# Patient Record
Sex: Male | Born: 1955
Health system: Southern US, Community
[De-identification: ages and names within clinical notes are randomized; demographics above are authoritative.]

## PROBLEM LIST (undated history)

## (undated) DIAGNOSIS — E538 Deficiency of other specified B group vitamins: Secondary | ICD-10-CM

## (undated) DIAGNOSIS — L03114 Cellulitis of left upper limb: Secondary | ICD-10-CM

## (undated) DIAGNOSIS — Z720 Tobacco use: Secondary | ICD-10-CM

## (undated) DIAGNOSIS — F32A Depression, unspecified: Secondary | ICD-10-CM

## (undated) DIAGNOSIS — M5090 Cervical disc disorder, unspecified, unspecified cervical region: Secondary | ICD-10-CM

## (undated) DIAGNOSIS — K219 Gastro-esophageal reflux disease without esophagitis: Secondary | ICD-10-CM

## (undated) DIAGNOSIS — T8859XA Other complications of anesthesia, initial encounter: Secondary | ICD-10-CM

## (undated) DIAGNOSIS — F419 Anxiety disorder, unspecified: Secondary | ICD-10-CM

## (undated) DIAGNOSIS — T4145XA Adverse effect of unspecified anesthetic, initial encounter: Secondary | ICD-10-CM

## (undated) DIAGNOSIS — IMO0001 Reserved for inherently not codable concepts without codable children: Secondary | ICD-10-CM

## (undated) DIAGNOSIS — F329 Major depressive disorder, single episode, unspecified: Secondary | ICD-10-CM

## (undated) DIAGNOSIS — R079 Chest pain, unspecified: Secondary | ICD-10-CM

## (undated) DIAGNOSIS — M545 Low back pain, unspecified: Secondary | ICD-10-CM

## (undated) DIAGNOSIS — D649 Anemia, unspecified: Secondary | ICD-10-CM

## (undated) DIAGNOSIS — M2550 Pain in unspecified joint: Secondary | ICD-10-CM

## (undated) DIAGNOSIS — C801 Malignant (primary) neoplasm, unspecified: Secondary | ICD-10-CM

## (undated) DIAGNOSIS — M1990 Unspecified osteoarthritis, unspecified site: Secondary | ICD-10-CM

## (undated) DIAGNOSIS — M542 Cervicalgia: Secondary | ICD-10-CM

## (undated) DIAGNOSIS — K5730 Diverticulosis of large intestine without perforation or abscess without bleeding: Secondary | ICD-10-CM

## (undated) DIAGNOSIS — R29898 Other symptoms and signs involving the musculoskeletal system: Secondary | ICD-10-CM

## (undated) DIAGNOSIS — I10 Essential (primary) hypertension: Secondary | ICD-10-CM

## (undated) DIAGNOSIS — R091 Pleurisy: Secondary | ICD-10-CM

## (undated) DIAGNOSIS — M79603 Pain in arm, unspecified: Secondary | ICD-10-CM

## (undated) DIAGNOSIS — M79606 Pain in leg, unspecified: Secondary | ICD-10-CM

## (undated) DIAGNOSIS — M549 Dorsalgia, unspecified: Secondary | ICD-10-CM

## (undated) DIAGNOSIS — G8929 Other chronic pain: Secondary | ICD-10-CM

## (undated) DIAGNOSIS — E785 Hyperlipidemia, unspecified: Secondary | ICD-10-CM

## (undated) HISTORY — PX: ROTATOR CUFF REPAIR: SHX139

## (undated) HISTORY — DX: Deficiency of other specified B group vitamins: E53.8

## (undated) HISTORY — PX: POLYPECTOMY: SHX149

## (undated) HISTORY — DX: Malignant (primary) neoplasm, unspecified: C80.1

## (undated) HISTORY — DX: Diverticulosis of large intestine without perforation or abscess without bleeding: K57.30

## (undated) HISTORY — PX: COLONOSCOPY: SHX174

## (undated) HISTORY — DX: Anemia, unspecified: D64.9

## (undated) HISTORY — DX: Pain in leg, unspecified: M79.606

## (undated) HISTORY — PX: ELBOW ARTHROSCOPY: SUR87

## (undated) HISTORY — DX: Gastro-esophageal reflux disease without esophagitis: K21.9

## (undated) HISTORY — DX: Cervicalgia: M54.2

## (undated) HISTORY — DX: Hyperlipidemia, unspecified: E78.5

## (undated) HISTORY — DX: Essential (primary) hypertension: I10

## (undated) HISTORY — PX: CERVICAL DISC SURGERY: SHX588

## (undated) HISTORY — DX: Anxiety disorder, unspecified: F41.9

## (undated) HISTORY — PX: NECK SURGERY: SHX720

## (undated) HISTORY — DX: Unspecified osteoarthritis, unspecified site: M19.90

## (undated) HISTORY — DX: Other symptoms and signs involving the musculoskeletal system: R29.898

## (undated) HISTORY — PX: CARPAL TUNNEL RELEASE: SHX101

## (undated) HISTORY — DX: Pain in unspecified joint: M25.50

## (undated) HISTORY — DX: Pain in arm, unspecified: M79.603

## (undated) HISTORY — DX: Dorsalgia, unspecified: M54.9

---

## 2002-06-28 ENCOUNTER — Emergency Department (HOSPITAL_COMMUNITY): Admission: EM | Admit: 2002-06-28 | Discharge: 2002-06-28 | Payer: Self-pay | Admitting: *Deleted

## 2002-09-05 ENCOUNTER — Encounter: Admission: RE | Admit: 2002-09-05 | Discharge: 2002-09-05 | Payer: Self-pay | Admitting: Neurosurgery

## 2002-09-05 ENCOUNTER — Encounter: Payer: Self-pay | Admitting: Neurosurgery

## 2002-10-03 ENCOUNTER — Encounter: Payer: Self-pay | Admitting: Neurosurgery

## 2002-10-03 ENCOUNTER — Encounter: Admission: RE | Admit: 2002-10-03 | Discharge: 2002-10-03 | Payer: Self-pay | Admitting: Neurosurgery

## 2003-08-06 ENCOUNTER — Ambulatory Visit (HOSPITAL_COMMUNITY): Admission: RE | Admit: 2003-08-06 | Discharge: 2003-08-07 | Payer: Self-pay | Admitting: Neurosurgery

## 2004-05-26 ENCOUNTER — Encounter: Admission: RE | Admit: 2004-05-26 | Discharge: 2004-05-26 | Payer: Self-pay | Admitting: Otolaryngology

## 2004-06-17 ENCOUNTER — Encounter: Admission: RE | Admit: 2004-06-17 | Discharge: 2004-06-17 | Payer: Self-pay

## 2005-12-25 ENCOUNTER — Inpatient Hospital Stay (HOSPITAL_COMMUNITY): Admission: EM | Admit: 2005-12-25 | Discharge: 2005-12-27 | Payer: Self-pay | Admitting: Emergency Medicine

## 2006-01-02 ENCOUNTER — Ambulatory Visit: Payer: Self-pay | Admitting: Internal Medicine

## 2006-11-20 ENCOUNTER — Ambulatory Visit: Payer: Self-pay | Admitting: Internal Medicine

## 2006-11-28 ENCOUNTER — Ambulatory Visit: Payer: Self-pay | Admitting: Internal Medicine

## 2006-11-28 LAB — HM COLONOSCOPY

## 2007-07-11 ENCOUNTER — Ambulatory Visit (HOSPITAL_COMMUNITY): Admission: RE | Admit: 2007-07-11 | Discharge: 2007-07-11 | Payer: Self-pay | Admitting: Neurosurgery

## 2007-10-18 ENCOUNTER — Inpatient Hospital Stay (HOSPITAL_COMMUNITY): Admission: RE | Admit: 2007-10-18 | Discharge: 2007-10-20 | Payer: Self-pay | Admitting: Neurosurgery

## 2008-04-20 ENCOUNTER — Encounter: Payer: Self-pay | Admitting: Internal Medicine

## 2008-04-21 ENCOUNTER — Encounter: Payer: Self-pay | Admitting: Internal Medicine

## 2008-06-02 ENCOUNTER — Encounter: Payer: Self-pay | Admitting: Internal Medicine

## 2008-06-29 DIAGNOSIS — IMO0002 Reserved for concepts with insufficient information to code with codable children: Secondary | ICD-10-CM | POA: Insufficient documentation

## 2008-06-29 DIAGNOSIS — K5730 Diverticulosis of large intestine without perforation or abscess without bleeding: Secondary | ICD-10-CM | POA: Insufficient documentation

## 2008-06-29 DIAGNOSIS — I10 Essential (primary) hypertension: Secondary | ICD-10-CM | POA: Insufficient documentation

## 2008-06-29 DIAGNOSIS — K219 Gastro-esophageal reflux disease without esophagitis: Secondary | ICD-10-CM | POA: Insufficient documentation

## 2008-06-29 DIAGNOSIS — M129 Arthropathy, unspecified: Secondary | ICD-10-CM | POA: Insufficient documentation

## 2008-06-29 DIAGNOSIS — F419 Anxiety disorder, unspecified: Secondary | ICD-10-CM

## 2008-06-29 DIAGNOSIS — M5020 Other cervical disc displacement, unspecified cervical region: Secondary | ICD-10-CM | POA: Insufficient documentation

## 2008-06-30 ENCOUNTER — Ambulatory Visit: Payer: Self-pay | Admitting: Internal Medicine

## 2008-06-30 DIAGNOSIS — R1032 Left lower quadrant pain: Secondary | ICD-10-CM | POA: Insufficient documentation

## 2008-06-30 DIAGNOSIS — D649 Anemia, unspecified: Secondary | ICD-10-CM | POA: Insufficient documentation

## 2008-06-30 LAB — CONVERTED CEMR LAB
Ferritin: 196 ng/mL (ref 22.0–322.0)
Folate: 10.2 ng/mL
Iron: 106 ug/dL (ref 42–165)
Saturation Ratios: 26.7 % (ref 20.0–50.0)
Transferrin: 283.8 mg/dL (ref >=212.0)
Vitamin B-12: 170 pg/mL — ABNORMAL LOW (ref 211–911)

## 2008-09-08 ENCOUNTER — Ambulatory Visit: Payer: Self-pay | Admitting: Internal Medicine

## 2008-09-08 DIAGNOSIS — E538 Deficiency of other specified B group vitamins: Secondary | ICD-10-CM

## 2008-09-08 DIAGNOSIS — E785 Hyperlipidemia, unspecified: Secondary | ICD-10-CM | POA: Insufficient documentation

## 2008-09-08 DIAGNOSIS — Z860100 Personal history of colon polyps, unspecified: Secondary | ICD-10-CM | POA: Insufficient documentation

## 2008-10-07 ENCOUNTER — Ambulatory Visit: Payer: Self-pay | Admitting: Internal Medicine

## 2008-11-05 ENCOUNTER — Ambulatory Visit: Payer: Self-pay | Admitting: Internal Medicine

## 2008-12-03 ENCOUNTER — Ambulatory Visit: Payer: Self-pay | Admitting: Internal Medicine

## 2009-01-07 ENCOUNTER — Ambulatory Visit: Payer: Self-pay | Admitting: Internal Medicine

## 2009-01-27 ENCOUNTER — Ambulatory Visit: Payer: Self-pay | Admitting: Internal Medicine

## 2009-02-04 ENCOUNTER — Ambulatory Visit: Payer: Self-pay | Admitting: Internal Medicine

## 2009-03-08 ENCOUNTER — Ambulatory Visit: Payer: Self-pay | Admitting: Internal Medicine

## 2009-04-05 ENCOUNTER — Ambulatory Visit: Payer: Self-pay | Admitting: Internal Medicine

## 2009-04-28 ENCOUNTER — Ambulatory Visit: Payer: Self-pay | Admitting: Internal Medicine

## 2009-05-03 ENCOUNTER — Ambulatory Visit: Payer: Self-pay | Admitting: Internal Medicine

## 2009-05-27 ENCOUNTER — Telehealth: Payer: Self-pay | Admitting: Internal Medicine

## 2009-05-27 ENCOUNTER — Ambulatory Visit: Payer: Self-pay | Admitting: Internal Medicine

## 2009-05-27 DIAGNOSIS — M109 Gout, unspecified: Secondary | ICD-10-CM | POA: Insufficient documentation

## 2009-05-27 LAB — CONVERTED CEMR LAB: Uric Acid, Serum: 6.6 mg/dL (ref 4.0–7.8)

## 2009-06-28 ENCOUNTER — Ambulatory Visit: Payer: Self-pay | Admitting: Internal Medicine

## 2009-07-13 ENCOUNTER — Telehealth: Payer: Self-pay | Admitting: Internal Medicine

## 2009-07-29 ENCOUNTER — Ambulatory Visit: Payer: Self-pay | Admitting: Internal Medicine

## 2009-08-30 ENCOUNTER — Ambulatory Visit: Payer: Self-pay | Admitting: Internal Medicine

## 2009-09-27 ENCOUNTER — Ambulatory Visit: Payer: Self-pay | Admitting: Internal Medicine

## 2009-11-01 ENCOUNTER — Ambulatory Visit: Payer: Self-pay | Admitting: Internal Medicine

## 2009-11-01 LAB — CONVERTED CEMR LAB
ALT: 39 units/L (ref 0–53)
AST: 34 units/L (ref 0–37)
Albumin: 4.3 g/dL (ref 3.5–5.2)
Alkaline Phosphatase: 74 units/L (ref 39–117)
BUN: 10 mg/dL (ref 6–23)
Basophils Absolute: 0 10*3/uL (ref 0.0–0.1)
Basophils Relative: 0.5 % (ref 0.0–3.0)
Bilirubin, Direct: 0.1 mg/dL (ref 0.0–0.3)
CO2: 28 meq/L (ref 19–32)
Calcium: 9.3 mg/dL (ref 8.4–10.5)
Chloride: 103 meq/L (ref 96–112)
Cholesterol: 192 mg/dL (ref 0–200)
Creatinine, Ser: 1.2 mg/dL (ref 0.4–1.5)
Direct LDL: 106.7 mg/dL
Eosinophils Absolute: 0.1 10*3/uL (ref 0.0–0.7)
Eosinophils Relative: 1.9 % (ref 0.0–5.0)
GFR calc non Af Amer: 67.5 mL/min (ref >60)
Glucose, Bld: 99 mg/dL (ref 70–99)
HCT: 40.8 % (ref 39.0–52.0)
HDL: 49.6 mg/dL (ref >39.00)
Hemoglobin: 14 g/dL (ref 13.0–17.0)
Lymphocytes Relative: 24.4 % (ref 12.0–46.0)
Lymphs Abs: 1.7 10*3/uL (ref 0.7–4.0)
MCHC: 34.3 g/dL (ref 30.0–36.0)
MCV: 93.9 fL (ref 78.0–100.0)
Monocytes Absolute: 0.5 10*3/uL (ref 0.1–1.0)
Monocytes Relative: 6.8 % (ref 3.0–12.0)
Neutro Abs: 4.7 10*3/uL (ref 1.4–7.7)
Neutrophils Relative %: 66.4 % (ref 43.0–77.0)
PSA: 0.47 ng/mL (ref 0.10–4.00)
Platelets: 197 10*3/uL (ref 150.0–400.0)
Potassium: 3.9 meq/L (ref 3.5–5.1)
RBC: 4.35 M/uL (ref 4.22–5.81)
RDW: 13.6 % (ref 11.5–14.6)
Sodium: 141 meq/L (ref 135–145)
TSH: 0.67 microintl units/mL (ref 0.35–5.50)
Total Bilirubin: 0.7 mg/dL (ref 0.3–1.2)
Total CHOL/HDL Ratio: 4
Total Protein: 7.1 g/dL (ref 6.0–8.3)
Triglycerides: 309 mg/dL — ABNORMAL HIGH (ref 0.0–149.0)
VLDL: 61.8 mg/dL — ABNORMAL HIGH (ref 0.0–40.0)
WBC: 7.1 10*3/uL (ref 4.5–10.5)

## 2009-11-15 ENCOUNTER — Ambulatory Visit: Payer: Self-pay | Admitting: Internal Medicine

## 2009-12-01 ENCOUNTER — Ambulatory Visit: Payer: Self-pay | Admitting: Internal Medicine

## 2010-01-04 ENCOUNTER — Ambulatory Visit: Payer: Self-pay | Admitting: Internal Medicine

## 2010-02-04 ENCOUNTER — Telehealth: Payer: Self-pay | Admitting: Internal Medicine

## 2010-02-04 ENCOUNTER — Ambulatory Visit: Payer: Self-pay | Admitting: Internal Medicine

## 2010-03-09 ENCOUNTER — Ambulatory Visit: Payer: Self-pay | Admitting: Internal Medicine

## 2010-03-17 ENCOUNTER — Ambulatory Visit: Payer: Self-pay | Admitting: Internal Medicine

## 2010-04-11 ENCOUNTER — Ambulatory Visit: Payer: Self-pay | Admitting: Internal Medicine

## 2010-05-09 ENCOUNTER — Ambulatory Visit: Payer: Self-pay | Admitting: Internal Medicine

## 2010-06-10 ENCOUNTER — Ambulatory Visit: Payer: Self-pay | Admitting: Internal Medicine

## 2010-07-11 ENCOUNTER — Ambulatory Visit: Payer: Self-pay | Admitting: Internal Medicine

## 2010-08-08 ENCOUNTER — Ambulatory Visit
Admission: RE | Admit: 2010-08-08 | Discharge: 2010-08-08 | Payer: Self-pay | Source: Home / Self Care | Attending: Internal Medicine | Admitting: Internal Medicine

## 2010-08-08 ENCOUNTER — Encounter: Payer: Self-pay | Admitting: Internal Medicine

## 2010-08-25 NOTE — Assessment & Plan Note (Signed)
Summary: b12 injection  Nurse Visit   Allergies: 1)  ! Percodan  Medication Administration  Injection # 1:    Medication: Vit B12 1000 mcg    Diagnosis: VITAMIN B12 DEFICIENCY (ICD-266.2)    Route: IM    Site: L deltoid    Exp Date: 04/2012    Lot #: [id number]    Mfr: [employer] Regent    Patient tolerated injection without complications    Given by: Duard Brady LPN (August 08, 2010 8:44 AM)  Orders Added: 1)  Vit B12 1000 mcg [J3420] 2)  Admin of Therapeutic Inj  intramuscular or subcutaneous [16109]

## 2010-08-25 NOTE — Assessment & Plan Note (Signed)
Summary: B12 INJ/CB  Nurse Visit   Allergies: 1)  ! Percodan  Medication Administration  Injection # 1:    Medication: Vit B12 1000 mcg    Diagnosis: VITAMIN B12 DEFICIENCY (ICD-266.2)    Route: IM    Site: L deltoid    Exp Date: 03/2011    Lot #: [id number]    Mfr: American Regent    Patient tolerated injection without complications    Given by: Duard Brady LPN (Dec 01, 2009 8:47 AM)  Orders Added: 1)  Vit B12 1000 mcg [J3420] 2)  Admin of Therapeutic Inj  intramuscular or subcutaneous [[id number]]

## 2010-08-25 NOTE — Assessment & Plan Note (Signed)
Summary: B12 INJ  Nurse Visit   Vitals Entered By: Duard Brady LPN (January 04, 2010 9:03 AM)  Allergies: 1)  ! Percodan  Medication Administration  Injection # 1:    Medication: Vit B12 1000 mcg    Diagnosis: VITAMIN B12 DEFICIENCY (ICD-266.2)    Route: IM    Site: R deltoid    Exp Date: 10/2011    Lot #: [id number]    Mfr: app pharm.    Patient tolerated injection without complications    Given by: Duard Brady LPN (January 04, 2010 9:04 AM)  Orders Added: 1)  Vit B12 1000 mcg [J3420] 2)  Admin of Therapeutic Inj  intramuscular or subcutaneous [[id number]]

## 2010-08-25 NOTE — Assessment & Plan Note (Signed)
Summary: B12/cb  Nurse Visit   Vital Signs:  Patient profile:   55 year old male Temp:     98.4 degrees F oral  Vitals Entered By: Duard Brady LPN (March 09, 2010 8:45 AM)   Allergies: 1)  ! Percodan  Medication Administration  Injection # 1:    Medication: Vit B12 1000 mcg    Diagnosis: VITAMIN B12 DEFICIENCY (ICD-266.2)    Route: IM    Site: L deltoid    Exp Date: 08/2011    Lot #: [id number]    Mfr: American Regent    Patient tolerated injection without complications    Given by: Duard Brady LPN (March 09, 2010 8:46 AM)  Orders Added: 1)  Vit B12 1000 mcg [J3420]

## 2010-08-25 NOTE — Assessment & Plan Note (Signed)
Summary: B12 INJ/CB  Nurse Visit   Allergies: 1)  ! Percodan  Medication Administration  Injection # 1:    Medication: Vit B12 1000 mcg    Diagnosis: VITAMIN B12 DEFICIENCY (ICD-266.2)    Route: IM    Site: L deltoid    Exp Date: 03/2011    Lot #: [id number]    Mfr: American Regent    Patient tolerated injection without complications    Given by: Duard Brady LPN (November 01, 2009 8:58 AM)  Orders Added: 1)  Vit B12 1000 mcg [J3420] 2)  Admin of Therapeutic Inj  intramuscular or subcutaneous [[id number]]  per Dr. Amador Cunas - ok to do routine cpx labs since pt is fasting - appt 4/25   KIK  Appended Document: Orders Update    Clinical Lists Changes  Orders: Added new Service order of Venipuncture (69629) - Signed Added new Test order of TLB-Lipid Panel (80061-LIPID) - Signed Added new Test order of TLB-BMP (Basic Metabolic Panel-BMET) (80048-METABOL) - Signed Added new Test order of TLB-CBC Platelet - w/Differential (85025-CBCD) - Signed Added new Test order of TLB-Hepatic/Liver Function Pnl (80076-HEPATIC) - Signed Added new Test order of TLB-TSH (Thyroid Stimulating Hormone) (84443-TSH) - Signed Added new Test order of TLB-PSA (Prostate Specific Antigen) (84153-PSA) - Signed Added new Service order of UA Dipstick w/o Micro (automated)  (81003) - Signed Observations: Added new observation of COMMENTS: Rita Ohara  November 01, 2009 10:31 AM  (11/01/2009 9:11) Added new observation of PH URINE: 5.0  (11/01/2009 9:11) Added new observation of SPEC GR URIN: <1.005  (11/01/2009 9:11) Added new observation of APPEARANCE U: Clear  (11/01/2009 9:11) Added new observation of UA COLOR: yellow  (11/01/2009 9:11) Added new observation of WBC DIPSTK U: negative  (11/01/2009 9:11) Added new observation of NITRITE URN: negative  (11/01/2009 9:11) Added new observation of UROBILINOGEN: 0.2  (11/01/2009 9:11) Added new observation of PROTEIN, URN: negative  (11/01/2009 9:11) Added new  observation of BLOOD UR DIP: negative  (11/01/2009 9:11) Added new observation of KETONES URN: negative  (11/01/2009 9:11) Added new observation of BILIRUBIN UR: negative  (11/01/2009 9:11) Added new observation of GLUCOSE, URN: negative  (11/01/2009 9:11)      Laboratory Results   Urine Tests    Routine Urinalysis   Color: yellow Appearance: Clear Glucose: negative   (Normal Range: Negative) Bilirubin: negative   (Normal Range: Negative) Ketone: negative   (Normal Range: Negative) Spec. Gravity: <1.005   (Normal Range: 1.003-1.035) Blood: negative   (Normal Range: Negative) pH: 5.0   (Normal Range: 5.0-8.0) Protein: negative   (Normal Range: Negative) Urobilinogen: 0.2   (Normal Range: 0-1) Nitrite: negative   (Normal Range: Negative) Leukocyte Esterace: negative   (Normal Range: Negative)    Comments: Rita Ohara  November 01, 2009 10:31 AM

## 2010-08-25 NOTE — Progress Notes (Signed)
Summary: lisnopril 90 dayrx  Phone Note Call from Patient   Caller: Patient Call For: Gordy Savers  MD Reason for Call: Talk to Nurse Summary of Call: need rx lisinopril for 90 day to [facility] mail order Initial call taken by: Duard Brady LPN,  February 04, 2010 9:31 AM  Follow-up for Phone Call        faxed   kik Follow-up by: Duard Brady LPN,  February 04, 2010 9:32 AM    Prescriptions: LISINOPRIL 20 MG TABS (LISINOPRIL) one tablet daily  #90 x 4   Entered by:   Duard Brady LPN   Authorized by:   Gordy Savers  MD   Signed by:   Duard Brady LPN on 62/95/2841   Method used:   Faxed to ...       [facility] (mail-order)             , [place]         Ph: [phone]       Fax: [phone]   RxID:   [id number]

## 2010-08-25 NOTE — Assessment & Plan Note (Signed)
Summary: B-12INJ/RCD  Nurse Visit   Vital Signs:  Patient profile:   55 year old male Temp:     98.0 degrees F oral  Vitals Entered By: Duard Brady LPN (April 11, 2010 8:50 AM)  Allergies: 1)  ! Percodan  Medication Administration  Injection # 1:    Medication: Vit B12 1000 mcg    Diagnosis: VITAMIN B12 DEFICIENCY (ICD-266.2)    Route: IM    Site: R deltoid    Exp Date: 12/2011    Lot #: [id number]    Mfr: [employer] Regent    Patient tolerated injection without complications    Given by: Duard Brady LPN (April 11, 2010 8:51 AM)  Orders Added: 1)  Vit B12 1000 mcg [J3420] 2)  Admin of Therapeutic Inj  intramuscular or subcutaneous [96372]  Appended Document: B-12INJ/RCD  Flu Vaccine Consent Questions     Do you have a history of severe allergic reactions to this vaccine? no    Any prior history of allergic reactions to egg and/or gelatin? no    Do you have a sensitivity to the preservative Thimersol? no    Do you have a past history of Guillan-Barre Syndrome? no    Do you currently have an acute febrile illness? no    Have you ever had a severe reaction to latex? no    Vaccine information given and explained to patient? yes    Are you currently pregnant? no    Lot Number:[id number]   Exp Date:01/21/2011   Site Given  Left Deltoid IM    Allergies: 1)  ! Percodan   Complete Medication List: 1)  Toprol Xl 100mg  Xr24h-tab (metoprolol Succinate)  .... One half tablet daily for two weeks, then one half tablet every other day for two weeks, then discontinue 2)  Hydrocodone-acetaminophen 5-500 Mg Tabs (Hydrocodone-acetaminophen) .... 2 tablets by mouth as needed pain 3)  Cymbalta 60 Mg Cpep (Duloxetine hcl) .Marland Kitchen.. 1 tablet by mouth once daily 4)  Xanax 0.5 Mg Tabs (Alprazolam) .Marland Kitchen.. 1 three times a day as needed 5)  Voltaren 75 Mg Tbec (Diclofenac sodium) .Marland Kitchen.. 1 tablet by mouth two times a day 6)  Flexeril 10 Mg Tabs (Cyclobenzaprine hcl) .Marland Kitchen.. 1 tablet by  mouth once daily 7)  Zocor 20 Mg Tabs (Simvastatin) .Marland Kitchen.. 1 tablet by mouth once daily 8)  Omeprazole 40 Mg Cpdr (Omeprazole) .Marland Kitchen.. 1 once daily 9)  Diltiazem Hcl Coated Beads 240 Mg Xr24h-cap (Diltiazem hcl coated beads) .... One daily 10)  Cyanocobalamin 1000 Mcg/ml Soln (Cyanocobalamin) .Marland Kitchen.. 1 mg im monthly 11)  Bd Eclipse Syringe 30g X 1/2" 1 Ml Misc (Syringe/needle (disp)) .... Use monthly 12)  Lisinopril 20 Mg Tabs (Lisinopril) .... One tablet daily 13)  Viagra 100 Mg Tabs (Sildenafil citrate) .... One daily as directed  Other Orders: Admin 1st Vaccine (87564) Flu Vaccine 52yrs + [phone])

## 2010-08-25 NOTE — Assessment & Plan Note (Signed)
  Nurse Visit   Allergies: 1)  ! Percodan  Medication Administration  Injection # 1:    Medication: Vit B12 1000 mcg    Diagnosis: VITAMIN B12 DEFICIENCY (ICD-266.2)    Route: IM    Site: L deltoid    Exp Date: 02/22/2011    Lot #: [id number]    Mfr: [employer] Regent    Patient tolerated injection without complications    Given by: Raechel Ache, RN (August 30, 2009 9:12 AM)  Orders Added: 1)  Vit B12 1000 mcg [J3420] 2)  Admin of Therapeutic Inj  intramuscular or subcutaneous [14782]

## 2010-08-25 NOTE — Assessment & Plan Note (Signed)
Summary: b12 with Kim//ccm  Nurse Visit   Vitals Entered By: Duard Brady LPN (September 27, 1608 8:48 AM)  Allergies: 1)  ! Percodan  Medication Administration  Injection # 1:    Medication: Vit B12 1000 mcg    Diagnosis: VITAMIN B12 DEFICIENCY (ICD-266.2)    Route: IM    Site: L deltoid    Exp Date: 03/2011    Lot #: [id number]    Mfr: American Regent    Patient tolerated injection without complications    Given by: Duard Brady LPN (September 28, 5407 8:49 AM)  Orders Added: 1)  Vit B12 1000 mcg [J3420] 2)  Admin of Therapeutic Inj  intramuscular or subcutaneous [81191]

## 2010-08-25 NOTE — Assessment & Plan Note (Signed)
Summary: 4 month follow up/cjr   Vital Signs:  Patient profile:   55 year old male Weight:      170 pounds Temp:     97.6 degrees F oral BP sitting:   110 / 72  (left arm) Cuff size:   regular  Vitals Entered By: Kathrynn Speed CMA (March 17, 2010 8:28 AM) CC: 4 month follow up, src Is Patient Diabetic? No   Primary Care Tynisha Ogan:  [name], MD  CC:  4 month follow up and src.  History of Present Illness: 55 year old patient seen today for follow-up of his hypertension.  He has a history of gastro-social reflux disease.  B12 deficiency.  He has done quite well on his present regimen.  Toprol was tapered and discontinued and lisinopril substituted.  He tolerates this well.  No cough. he has a history of arthritis and has been followed by both rheumatology and orthopedics  Current Medications (verified): 1)  Toprol Xl  100mg  Xr24h-Tab (Metoprolol Succinate) .... One Half Tablet Daily For Two Weeks, Then One Half Tablet Every Other Day For Two Weeks, Then Discontinue 2)  Hydrocodone-Acetaminophen 5-500 Mg Tabs (Hydrocodone-Acetaminophen) .... 2 Tablets By Mouth As Needed Pain 3)  Cymbalta 60 Mg Cpep (Duloxetine Hcl) .Marland Kitchen.. 1 Tablet By Mouth Once Daily 4)  Xanax 0.5 Mg Tabs (Alprazolam) .Marland Kitchen.. 1 Three Times A Day As Needed 5)  Voltaren 75 Mg Tbec (Diclofenac Sodium) .Marland Kitchen.. 1 Tablet By Mouth Two Times A Day 6)  Flexeril 10 Mg Tabs (Cyclobenzaprine Hcl) .Marland Kitchen.. 1 Tablet By Mouth Once Daily 7)  Zocor 20 Mg Tabs (Simvastatin) .Marland Kitchen.. 1 Tablet By Mouth Once Daily 8)  Omeprazole 40 Mg Cpdr (Omeprazole) .Marland Kitchen.. 1 Once Daily 9)  Diltiazem Hcl Coated Beads 240 Mg Xr24h-Cap (Diltiazem Hcl Coated Beads) .... One Daily 10)  Cyanocobalamin 1000 Mcg/ml Soln (Cyanocobalamin) .Marland Kitchen.. 1 Mg Im Monthly 11)  Bd Eclipse Syringe 30g X 1/2" 1 Ml Misc (Syringe/needle (Disp)) .... Use Monthly 12)  Lisinopril 20 Mg Tabs (Lisinopril) .... One Tablet Daily 13)  Viagra 100 Mg Tabs (Sildenafil Citrate) .... One Daily As  Directed  Allergies (verified): 1)  ! Percodan  Past History:  Past Medical History: Reviewed history from 05/27/2009 and no changes required. Current Problems:  DIVERTICULOSIS, COLON (ICD-562.10) GERD (ICD-530.81) HERNIATED DISC (ICD-722.2) SLEEP APNEA (ICD-780.57) ANXIETY (ICD-300.00) ARTHRITIS (ICD-716.90) HYPERTENSION (ICD-401.9) Colonic polyps, hx of B12 deficiency Anemia-NOS Hyperlipidemia possible gout  Past Surgical History: Reviewed history from 09/08/2008 and no changes required. Back surgery - lower  Rotator Cuff x 25 February 1999, January 2002, July 2008, July 2009 Neck surgery - c-spine x2 ; January 2005, March 2009  colonoscopy in 2008  Review of Systems  The patient denies anorexia, fever, weight loss, weight gain, vision loss, decreased hearing, hoarseness, chest pain, syncope, dyspnea on exertion, peripheral edema, prolonged cough, headaches, hemoptysis, abdominal pain, melena, hematochezia, severe indigestion/heartburn, hematuria, incontinence, genital sores, muscle weakness, suspicious skin lesions, transient blindness, difficulty walking, depression, unusual weight change, abnormal bleeding, enlarged lymph nodes, angioedema, breast masses, and testicular masses.    Physical Exam  General:  Well-developed,well-nourished,in no acute distress; alert,appropriate and cooperative throughout examination Head:  Normocephalic and atraumatic without obvious abnormalities. No apparent alopecia or balding. Eyes:  No corneal or conjunctival inflammation noted. EOMI. Perrla. Funduscopic exam benign, without hemorrhages, exudates or papilledema. Vision grossly normal. Mouth:  Oral mucosa and oropharynx without lesions or exudates.  Teeth in good repair. Neck:  No deformities, masses, or tenderness noted. Lungs:  Normal respiratory  effort, chest expands symmetrically. Lungs are clear to auscultation, no crackles or wheezes. Heart:  Normal rate and regular rhythm. S1 and  S2 normal without gallop, murmur, click, rub or other extra sounds. Abdomen:  Bowel sounds positive,abdomen soft and non-tender without masses, organomegaly or hernias noted. Msk:  No deformity or scoliosis noted of thoracic or lumbar spine.   Pulses:  R and L carotid,radial,femoral,dorsalis pedis and posterior tibial pulses are full and equal bilaterally Extremities:  No clubbing, cyanosis, edema, or deformity noted with normal full range of motion of all joints.     Impression & Recommendations:  Problem # 1:  HYPERTENSION (ICD-401.9)  His updated medication list for this problem includes:    Diltiazem Hcl Coated Beads 240 Mg Xr24h-cap (Diltiazem hcl coated beads) ..... One daily    Lisinopril 20 Mg Tabs (Lisinopril) ..... One tablet daily  His updated medication list for this problem includes:    Diltiazem Hcl Coated Beads 240 Mg Xr24h-cap (Diltiazem hcl coated beads) ..... One daily    Lisinopril 20 Mg Tabs (Lisinopril) ..... One tablet daily  Problem # 2:  ARTHRITIS (ICD-716.90)  Complete Medication List: 1)  Toprol Xl 100mg  Xr24h-tab (metoprolol Succinate)  .... One half tablet daily for two weeks, then one half tablet every other day for two weeks, then discontinue 2)  Hydrocodone-acetaminophen 5-500 Mg Tabs (Hydrocodone-acetaminophen) .... 2 tablets by mouth as needed pain 3)  Cymbalta 60 Mg Cpep (Duloxetine hcl) .Marland Kitchen.. 1 tablet by mouth once daily 4)  Xanax 0.5 Mg Tabs (Alprazolam) .Marland Kitchen.. 1 three times a day as needed 5)  Voltaren 75 Mg Tbec (Diclofenac sodium) .Marland Kitchen.. 1 tablet by mouth two times a day 6)  Flexeril 10 Mg Tabs (Cyclobenzaprine hcl) .Marland Kitchen.. 1 tablet by mouth once daily 7)  Zocor 20 Mg Tabs (Simvastatin) .Marland Kitchen.. 1 tablet by mouth once daily 8)  Omeprazole 40 Mg Cpdr (Omeprazole) .Marland Kitchen.. 1 once daily 9)  Diltiazem Hcl Coated Beads 240 Mg Xr24h-cap (Diltiazem hcl coated beads) .... One daily 10)  Cyanocobalamin 1000 Mcg/ml Soln (Cyanocobalamin) .Marland Kitchen.. 1 mg im monthly 11)  Bd  Eclipse Syringe 30g X 1/2" 1 Ml Misc (Syringe/needle (disp)) .... Use monthly 12)  Lisinopril 20 Mg Tabs (Lisinopril) .... One tablet daily 13)  Viagra 100 Mg Tabs (Sildenafil citrate) .... One daily as directed  Patient Instructions: 1)  Please schedule a follow-up appointment in 6 months. 2)  Limit your Sodium (Salt) to less than 2 grams a day(slightly less than 1/2 a teaspoon) to prevent fluid retention, swelling, or worsening of symptoms. 3)  It is important that you exercise regularly at least 20 minutes 5 times a week. If you develop chest pain, have severe difficulty breathing, or feel very tired , stop exercising immediately and seek medical attention. Prescriptions: VIAGRA 100 MG TABS (SILDENAFIL CITRATE) one daily as directed  #12 x 6   Entered and Authorized by:   Gordy Savers  MD   Signed by:   Gordy Savers  MD on 03/17/2010   Method used:   Print then Give to Patient   RxID:   [id number] LISINOPRIL 20 MG TABS (LISINOPRIL) one tablet daily  #90 x 4   Entered and Authorized by:   Gordy Savers  MD   Signed by:   Gordy Savers  MD on 03/17/2010   Method used:   Print then Give to Patient   RxID:   [id number] DILTIAZEM HCL COATED BEADS 240 MG XR24H-CAP (DILTIAZEM HCL COATED BEADS) one  daily  #90 x 4   Entered and Authorized by:   Gordy Savers  MD   Signed by:   Gordy Savers  MD on 03/17/2010   Method used:   Print then Give to Patient   RxID:   [id number] OMEPRAZOLE 40 MG CPDR (OMEPRAZOLE) 1 once daily  #90 x 4   Entered and Authorized by:   Gordy Savers  MD   Signed by:   Gordy Savers  MD on 03/17/2010   Method used:   Print then Give to Patient   RxID:   [id number] ZOCOR 20 MG TABS (SIMVASTATIN) 1 tablet by mouth once daily  #90 x 4   Entered and Authorized by:   Gordy Savers  MD   Signed by:   Gordy Savers  MD on 03/17/2010   Method used:   Print then Give to Patient   RxID:    [id number] FLEXERIL 10 MG TABS (CYCLOBENZAPRINE HCL) 1 tablet by mouth once daily  #90 x 4   Entered and Authorized by:   Gordy Savers  MD   Signed by:   Gordy Savers  MD on 03/17/2010   Method used:   Print then Give to Patient   RxID:   [id number] Prudy Feeler 0.5 MG TABS (ALPRAZOLAM) 1 three times a day as needed  #90 x 4   Entered and Authorized by:   Gordy Savers  MD   Signed by:   Gordy Savers  MD on 03/17/2010   Method used:   Print then Give to Patient   RxID:   [id number] CYMBALTA 60 MG CPEP (DULOXETINE HCL) 1 tablet by mouth once daily  #90 x 4   Entered and Authorized by:   Gordy Savers  MD   Signed by:   Gordy Savers  MD on 03/17/2010   Method used:   Print then Give to Patient   RxID:   [id number] HYDROCODONE-ACETAMINOPHEN 5-500 MG TABS (HYDROCODONE-ACETAMINOPHEN) 2 tablets by mouth as needed pain  #90 x 4   Entered and Authorized by:   Gordy Savers  MD   Signed by:   Gordy Savers  MD on 03/17/2010   Method used:   Print then Give to Patient   RxID:   [id number]

## 2010-08-25 NOTE — Assessment & Plan Note (Signed)
Summary: B-12 INJ//ALP  Nurse Visit   Allergies: 1)  ! Percodan  Medication Administration  Injection # 1:    Medication: Vit B12 1000 mcg    Diagnosis: VITAMIN B12 DEFICIENCY (ICD-266.2)    Route: IM    Site: R deltoid    Exp Date: 01/2012    Lot #: [id number]    Mfr: [employer] Regent    Patient tolerated injection without complications    Given by: Duard Brady LPN (July 11, 2010 8:57 AM)  Orders Added: 1)  Vit B12 1000 mcg [J3420] 2)  Admin of Therapeutic Inj  intramuscular or subcutaneous [16109]

## 2010-08-25 NOTE — Assessment & Plan Note (Signed)
Summary: B12 INJ/CB  Nurse Visit   Allergies: 1)  ! Percodan  Medication Administration  Injection # 1:    Medication: Vit B12 1000 mcg    Diagnosis: VITAMIN B12 DEFICIENCY (ICD-266.2)    Route: IM    Site: L deltoid    Exp Date: 08/2011    Lot #: [id number]    Mfr: [employer] Regent    Patient tolerated injection without complications    Given by: Duard Brady LPN (February 04, 2010 8:41 AM)  Orders Added: 1)  Vit B12 1000 mcg [J3420] 2)  Admin of Therapeutic Inj  intramuscular or subcutaneous [04540]

## 2010-08-25 NOTE — Assessment & Plan Note (Signed)
Summary: b-12/mm  Nurse Visit   Allergies: 1)  ! Percodan  Medication Administration  Injection # 1:    Medication: Vit B12 1000 mcg    Diagnosis: VITAMIN B12 DEFICIENCY (ICD-266.2)    Route: IM    Site: L deltoid    Exp Date: 02/2011    Lot #: [id number]    Mfr: [employer] Regent    Patient tolerated injection without complications    Given by: Raechel Ache, RN (July 29, 2009 9:09 AM)  Orders Added: 1)  Vit B12 1000 mcg [J3420] 2)  Admin of Therapeutic Inj  intramuscular or subcutaneous [[id number]]

## 2010-08-25 NOTE — Letter (Signed)
Summary: Physician Results Form/[facility]  Physician Results Form/[employer] [facility]   Imported By: Maryln Gottron 06/14/2010 10:28:35  _____________________________________________________________________  External Attachment:    Type:   Image     Comment:   External Document

## 2010-08-25 NOTE — Assessment & Plan Note (Signed)
Summary: cpx/njr/WIFE RESCD FROM BUMP//CCM   Vital Signs:  Patient profile:   55 year old male Height:      67 inches Weight:      176 pounds Temp:     98.3 degrees F oral BP sitting:   118 / 80  (right arm) Cuff size:   regular  Vitals Entered By: Duard Brady LPN (November 15, 2009 1:16 PM) CC: cpx - labs done  Is Patient Diabetic? No   Primary Care Provider:  [name], MD  CC:  cpx - labs done .  History of Present Illness: 55 year old gentleman, who is seen today for a wellness exam.  Her problems include a history of hypertension, gastroesophageal reflux disease, and degenerative disk disease.  He has a history of anxiety, depression, controlled on Cymbalta.  He has a history of gout and mild dyslipidemia.  His main complaint is ED, which he states is related to an increased dose of Toprol  Preventive Screening-Counseling & Management  Alcohol-Tobacco     Smoking Status: current     Smoking Cessation Counseling: yes  Allergies: 1)  ! Percodan  Past History:  Past Medical History: Reviewed history from 05/27/2009 and no changes required. Current Problems:  DIVERTICULOSIS, COLON (ICD-562.10) GERD (ICD-530.81) HERNIATED DISC (ICD-722.2) SLEEP APNEA (ICD-780.57) ANXIETY (ICD-300.00) ARTHRITIS (ICD-716.90) HYPERTENSION (ICD-401.9) Colonic polyps, hx of B12 deficiency Anemia-NOS Hyperlipidemia possible gout  Past Surgical History: Reviewed history from 09/08/2008 and no changes required. Back surgery - lower  Rotator Cuff x 25 February 1999, January 2002, July 2008, July 2009 Neck surgery - c-spine x2 ; January 2005, March 2009  colonoscopy in 2008  Family History: Reviewed history from 04/28/2009 and no changes required. Squamous cell carcinoma: Mother; died age 77; bone cancer, history colonic polyps Family History of Diabetes: Father; age 74, history of hypertension, colonic polyps Family History of Heart Disease: Mother  two brothers deceased  from motor vehicle accident one brother, hypertension one sister in good health  Social History: Reviewed history from 09/08/2008 and no changes required. Married Patient currently smokes.  Alcohol Use - yes Daily Caffeine Use Illicit Drug Use - no Patient does not get regular exercise.  Retired  Review of Systems  The patient denies anorexia, fever, weight loss, weight gain, vision loss, decreased hearing, hoarseness, chest pain, syncope, dyspnea on exertion, peripheral edema, prolonged cough, headaches, hemoptysis, abdominal pain, melena, hematochezia, severe indigestion/heartburn, hematuria, incontinence, genital sores, muscle weakness, suspicious skin lesions, transient blindness, difficulty walking, depression, unusual weight change, abnormal bleeding, enlarged lymph nodes, angioedema, breast masses, and testicular masses.    Physical Exam  General:  Well-developed,well-nourished,in no acute distress; alert,appropriate and cooperative throughout examination Head:  Normocephalic and atraumatic without obvious abnormalities. No apparent alopecia or balding. Ears:  External ear exam shows no significant lesions or deformities.  Otoscopic examination reveals clear canals, tympanic membranes are intact bilaterally without bulging, retraction, inflammation or discharge. Hearing is grossly normal bilaterally. Mouth:  Oral mucosa and oropharynx without lesions or exudates.  Teeth in good repair. Neck:  No deformities, masses, or tenderness noted. Chest Wall:  No deformities, masses, tenderness or gynecomastia noted. Breasts:  No masses or gynecomastia noted Lungs:  Normal respiratory effort, chest expands symmetrically. Lungs are clear to auscultation, no crackles or wheezes. Heart:  Normal rate and regular rhythm. S1 and S2 normal without gallop, murmur, click, rub or other extra sounds. Abdomen:  Bowel sounds positive,abdomen soft and non-tender without masses, organomegaly or hernias  noted. Rectal:  No external abnormalities  noted. Normal sphincter tone. No rectal masses or tenderness. Genitalia:  Testes bilaterally descended without nodularity, tenderness or masses. No scrotal masses or lesions. No penis lesions or urethral discharge. Prostate:  Prostate gland firm and smooth, no enlargement, nodularity, tenderness, mass, asymmetry or induration. Msk:  No deformity or scoliosis noted of thoracic or lumbar spine.   Pulses:  R and L carotid,radial,femoral,dorsalis pedis and posterior tibial pulses are full and equal bilaterally Extremities:  No clubbing, cyanosis, edema, or deformity noted with normal full range of motion of all joints.   Neurologic:  No cranial nerve deficits noted. Station and gait are normal. Plantar reflexes are down-going bilaterally. DTRs are symmetrical throughout. Sensory, motor and coordinative functions appear intact. Skin:  Intact without suspicious lesions or rashes Cervical Nodes:  No lymphadenopathy noted Axillary Nodes:  No palpable lymphadenopathy Inguinal Nodes:  No significant adenopathy Psych:  Cognition and judgment appear intact. Alert and cooperative with normal attention span and concentration. No apparent delusions, illusions, hallucinations   Impression & Recommendations:  Problem # 1:  Preventive Health Care (ICD-V70.0)  Complete Medication List: 1)  Toprol Xl 100mg  Xr24h-tab (metoprolol Succinate)  .... One half tablet daily for two weeks, then one half tablet every other day for two weeks, then discontinue 2)  Hydrocodone-acetaminophen 5-500 Mg Tabs (Hydrocodone-acetaminophen) .... 2 tablets by mouth as needed pain 3)  Cymbalta 60 Mg Cpep (Duloxetine hcl) .Marland Kitchen.. 1 tablet by mouth once daily 4)  Xanax 0.5 Mg Tabs (Alprazolam) .Marland Kitchen.. 1 three times a day as needed 5)  Voltaren 75 Mg Tbec (Diclofenac sodium) .Marland Kitchen.. 1 tablet by mouth two times a day 6)  Flexeril 10 Mg Tabs (Cyclobenzaprine hcl) .Marland Kitchen.. 1 tablet by mouth once daily 7)  Zocor  20 Mg Tabs (Simvastatin) .Marland Kitchen.. 1 tablet by mouth once daily 8)  Omeprazole 40 Mg Cpdr (Omeprazole) .Marland Kitchen.. 1 once daily 9)  Diltiazem Hcl Coated Beads 240 Mg Xr24h-cap (Diltiazem hcl coated beads) .... One daily 10)  Cyanocobalamin 1000 Mcg/ml Soln (Cyanocobalamin) .Marland Kitchen.. 1 mg im monthly 11)  Bd Eclipse Syringe 30g X 1/2" 1 Ml Misc (Syringe/needle (disp)) .... Use monthly 12)  Lisinopril 20 Mg Tabs (Lisinopril) .... One tablet daily 13)  Viagra 100 Mg Tabs (Sildenafil citrate) .... One daily as directed  Patient Instructions: 1)  Please schedule a follow-up appointment in 4 months. 2)  Limit your Sodium (Salt). 3)  Tobacco is very bad for your health and your loved ones! You Should stop smoking!. 4)  It is important that you exercise regularly at least 20 minutes 5 times a week. If you develop chest pain, have severe difficulty breathing, or feel very tired , stop exercising immediately and seek medical attention. 5)  Check your Blood Pressure regularly. If it is above: 150/90 you should make an appointment. Prescriptions: VIAGRA 100 MG TABS (SILDENAFIL CITRATE) one daily as directed  #12 x 6   Entered and Authorized by:   Gordy Savers  MD   Signed by:   Gordy Savers  MD on 11/15/2009   Method used:   Print then Give to Patient   RxID:   [id number] LISINOPRIL 20 MG TABS (LISINOPRIL) one tablet daily  #90 x 6   Entered and Authorized by:   Gordy Savers  MD   Signed by:   Gordy Savers  MD on 11/15/2009   Method used:   Print then Give to Patient   RxID:   [id number] BD ECLIPSE SYRINGE 30G X 1/2" 1 ML  MISC (SYRINGE/NEEDLE (DISP)) use monthly  #12 x 6   Entered and Authorized by:   Gordy Savers  MD   Signed by:   Gordy Savers  MD on 11/15/2009   Method used:   Print then Give to Patient   RxID:   [id number] DILTIAZEM HCL COATED BEADS 240 MG XR24H-CAP (DILTIAZEM HCL COATED BEADS) one daily  #90 x 4   Entered and Authorized by:   Gordy Savers  MD   Signed by:   Gordy Savers  MD on 11/15/2009   Method used:   Print then Give to Patient   RxID:   [id number] OMEPRAZOLE 40 MG CPDR (OMEPRAZOLE) 1 once daily  #90 x 4   Entered and Authorized by:   Gordy Savers  MD   Signed by:   Gordy Savers  MD on 11/15/2009   Method used:   Print then Give to Patient   RxID:   [id number] ZOCOR 20 MG TABS (SIMVASTATIN) 1 tablet by mouth once daily  #90 x 4   Entered and Authorized by:   Gordy Savers  MD   Signed by:   Gordy Savers  MD on 11/15/2009   Method used:   Print then Give to Patient   RxID:   [id number] FLEXERIL 10 MG TABS (CYCLOBENZAPRINE HCL) 1 tablet by mouth once daily  #90 x 4   Entered and Authorized by:   Gordy Savers  MD   Signed by:   Gordy Savers  MD on 11/15/2009   Method used:   Print then Give to Patient   RxID:   [id number] VOLTAREN 75 MG TBEC (DICLOFENAC SODIUM) 1 tablet by mouth two times a day  #180 x 4   Entered and Authorized by:   Gordy Savers  MD   Signed by:   Gordy Savers  MD on 11/15/2009   Method used:   Print then Give to Patient   RxID:   [id number] Prudy Feeler 0.5 MG TABS (ALPRAZOLAM) 1 three times a day as needed  #90 x 4   Entered and Authorized by:   Gordy Savers  MD   Signed by:   Gordy Savers  MD on 11/15/2009   Method used:   Print then Give to Patient   RxID:   [id number] CYMBALTA 60 MG CPEP (DULOXETINE HCL) 1 tablet by mouth once daily  #90 x 4   Entered and Authorized by:   Gordy Savers  MD   Signed by:   Gordy Savers  MD on 11/15/2009   Method used:   Print then Give to Patient   RxID:   [id number] HYDROCODONE-ACETAMINOPHEN 5-500 MG TABS (HYDROCODONE-ACETAMINOPHEN) 2 tablets by mouth as needed pain  #90 x 4   Entered and Authorized by:   Gordy Savers  MD   Signed by:   Gordy Savers  MD on 11/15/2009   Method used:   Print then Give to  Patient   RxID:   [id number]

## 2010-08-25 NOTE — Assessment & Plan Note (Signed)
Summary: B-12INJ  Nurse Visit   Allergies: 1)  ! Percodan  Medication Administration  Injection # 1:    Medication: Vit B12 1000 mcg    Diagnosis: VITAMIN B12 DEFICIENCY (ICD-266.2)    Route: IM    Site: L deltoid    Exp Date: 01/2012    Lot #: [id number]    Mfr: [employer] Regent    Patient tolerated injection without complications    Given by: Duard Brady LPN (June 10, 2010 9:00 AM)  Orders Added: 1)  Vit B12 1000 mcg [J3420] 2)  Admin of Therapeutic Inj  intramuscular or subcutaneous [27253]

## 2010-08-25 NOTE — Assessment & Plan Note (Signed)
Summary: B-12 INJ/RCD  Nurse Visit   Allergies: 1)  ! Percodan

## 2010-08-25 NOTE — Assessment & Plan Note (Signed)
Summary: b12 inj/njr  Nurse Visit   Vitals Entered By: Duard Brady LPN (May 09, 2010 8:52 AM)  Allergies: 1)  ! Percodan  Medication Administration  Injection # 1:    Medication: Vit B12 1000 mcg    Diagnosis: VITAMIN B12 DEFICIENCY (ICD-266.2)    Route: IM    Site: L deltoid    Exp Date: 01/2012    Lot #: [id number]    Mfr: American Regent    Patient tolerated injection without complications    Given by: Duard Brady LPN (May 09, 2010 8:57 AM)  Orders Added: 1)  Vit B12 1000 mcg [J3420] 2)  Admin of Therapeutic Inj  intramuscular or subcutaneous [16109]

## 2010-09-06 ENCOUNTER — Encounter: Payer: Self-pay | Admitting: Internal Medicine

## 2010-09-07 ENCOUNTER — Ambulatory Visit (INDEPENDENT_AMBULATORY_CARE_PROVIDER_SITE_OTHER): Payer: Medicare HMO | Admitting: Internal Medicine

## 2010-09-07 DIAGNOSIS — E538 Deficiency of other specified B group vitamins: Secondary | ICD-10-CM

## 2010-09-07 MED ORDER — CYANOCOBALAMIN 1000 MCG/ML IJ SOLN: 1000.0000 ug | INTRAMUSCULAR | Status: DC

## 2010-09-07 MED ORDER — CYANOCOBALAMIN 1000 MCG/ML IJ SOLN
1000.0000 ug | Freq: Once | INTRAMUSCULAR | Status: AC
  Administered 2010-09-07: 1000 ug via INTRAMUSCULAR

## 2010-10-05 ENCOUNTER — Encounter: Payer: Self-pay | Admitting: Internal Medicine

## 2010-10-06 ENCOUNTER — Ambulatory Visit (INDEPENDENT_AMBULATORY_CARE_PROVIDER_SITE_OTHER): Payer: Medicare HMO | Admitting: Internal Medicine

## 2010-10-06 DIAGNOSIS — E538 Deficiency of other specified B group vitamins: Secondary | ICD-10-CM

## 2010-10-06 MED ORDER — CYANOCOBALAMIN 1000 MCG/ML IJ SOLN
1000.0000 ug | Freq: Once | INTRAMUSCULAR | Status: AC
  Administered 2010-10-06: 1000 ug via INTRAMUSCULAR

## 2010-11-07 ENCOUNTER — Ambulatory Visit (INDEPENDENT_AMBULATORY_CARE_PROVIDER_SITE_OTHER): Payer: Medicare HMO | Admitting: Internal Medicine

## 2010-11-07 DIAGNOSIS — E538 Deficiency of other specified B group vitamins: Secondary | ICD-10-CM

## 2010-11-07 MED ORDER — CYANOCOBALAMIN 1000 MCG/ML IJ SOLN
1000.0000 ug | Freq: Once | INTRAMUSCULAR | Status: AC
  Administered 2010-11-07: 1000 ug via INTRAMUSCULAR

## 2010-12-06 ENCOUNTER — Ambulatory Visit (INDEPENDENT_AMBULATORY_CARE_PROVIDER_SITE_OTHER): Payer: Medicare HMO | Admitting: Internal Medicine

## 2010-12-06 DIAGNOSIS — E538 Deficiency of other specified B group vitamins: Secondary | ICD-10-CM

## 2010-12-06 MED ORDER — CYANOCOBALAMIN 1000 MCG/ML IJ SOLN
1000.0000 ug | Freq: Once | INTRAMUSCULAR | Status: AC
Start: 2010-12-06 — End: 2010-12-06
  Administered 2010-12-06: 1000 ug via INTRAMUSCULAR

## 2010-12-06 NOTE — Op Note (Signed)
NAME:  Aaron Black, Aaron Black NO.:  [id number]   MEDICAL RECORD NO.:  [id number]          PATIENT TYPE:  INP   LOCATION:  3001                         FACILITY:  [facility]   PHYSICIAN:  Danae Orleans. Venetia Maxon, M.D.  DATE OF BIRTH:  12-20-1955   DATE OF PROCEDURE:  10/18/2007  DATE OF DISCHARGE:                               OPERATIVE REPORT   PREOPERATIVE DIAGNOSIS:  C4-5 spondylolisthesis with stenosis and  spondylosis, C5 through C7 pseudoarthrosis with cervical radiculopathy.   POSTOPERATIVE DIAGNOSIS:  C4-5 spondylolisthesis with stenosis and  spondylosis, C5 through C7 pseudoarthrosis with cervical radiculopathy.   PROCEDURE:  Posterior cervical laminectomy C4 through C5 levels with  lateral mass screws C4 through C7 levels bilaterally with posterolateral  arthrodesis C4 through C7 levels bilaterally.   SURGEON:  Danae Orleans. Venetia Maxon, M.D.   ASSISTANT:  Hewitt Shorts, M.D. and Georgiann Cocker, RN   ANESTHESIA:  General endotracheal anesthesia.   ESTIMATED BLOOD LOSS:  Minimal.   COMPLICATIONS:  None.   DISPOSITION:  Recovery.   INDICATIONS:  Aaron Black is a 55 year old man who is a heavy smoker  who has previously undergone anterior cervical decompression and fusion  C5 through C7 levels.  He has developed significant bilateral shoulder  pain complicated by the fact that he has significant shoulder pathology.  He also has retrolisthesis of C4 and C5 with significant  spondylosis/degenerative disk disease at this level with foraminal  stenosis.  Preoperative myelogram demonstrated incomplete arthrodesis at  the previously operated levels and he had a broken screw at C5.  It was  therefore elected to take him to surgery for posterior decompression and  fusion C4 through C7 levels.   PROCEDURE IN DETAIL:  Mr. Chavarria was brought to the operating room.  Following a satisfactory and uncomplicated induction of general  endotracheal anesthesia and placement of intravenous  lines the patient  was placed in a hard cervical collar, three pin fixation and turned to  the prone position on the operating table.  His neck was maintained in  neutral alignment.  Lateral C-arm fluoroscopy demonstrated a well  aligned cervical spine.  His posterior neck was then prepped and draped  in the usual sterile fashion.  Area of planned incision was infiltrated  with quarter percent Marcaine and half percent lidocaine with 1:200,000  epinephrine.  An incision was made in the midline and carried through  subcutaneous tissues to the posterior cervical fascia which was incised  bilaterally.  Subperiosteal dissection was performed exposing the C4,  C5, C6 and C7 laminae and spinous processes and facet joints and lateral  masses.  After confirmatory C-arm localization 12 x 3.5 mm lateral mass  screws were placed at C4, C5, C6 and C7 levels bilaterally.  All screws  had excellent purchase and subsequently the rods were cut and lordosed  properly and affixed to the screw heads and locked in situ with a torque  wrench.  Prior to placing the rods the facet joints had been  aggressively decorticated and the lateral masses were also decorticated  for later bone grafting.  A laminectomy of C4  and C5 was then performed  and the spinal cord dura was decompressed as was the lateral aspect of  the spinal canal.  Hemostasis was assured.  Neural elements appeared to  be well decompressed.  The posterolateral arthrodesis was then performed  with a small kit of BMP morcellized bone autograft and 10 mL of [place]  reconstituted with autogenous blood.  This was tamped into position  overlying the facet joints and the lateral masses.  The self-retaining  retractors removed.  The posterior cervical fascia was closed with 0  Vicryl sutures.  Subcutaneous tissues were approximated with 2-0 Vicryl  interrupted inverted sutures and skin edges were approximated with  interrupted 3-0 Vicryl subcuticular  stitch.  The wound was dressed with  Benzoin, Steri-Strips, Telfa gauze and tape.  The patient was extubated  in the operating and taken to recovery in stable and satisfactory  condition having tolerated the procedure well.  Counts were correct at  the end of the case.      Danae Orleans. Venetia Maxon, M.D.  Electronically Signed     JDS/MEDQ  D:  10/18/2007  T:  10/19/2007  Job:  [id number]

## 2010-12-09 NOTE — Discharge Summary (Signed)
NAME:  Aaron Black, WAIDE NO.:  [id number]   MEDICAL RECORD NO.:  [id number]          PATIENT TYPE:  INP   LOCATION:  3023                         FACILITY:  [facility]   PHYSICIAN:  Elliot Cousin, M.D.    DATE OF BIRTH:  1955/12/18   DATE OF ADMISSION:  12/25/2005  DATE OF DISCHARGE:  12/27/2005                                 DISCHARGE SUMMARY   DISCHARGE DIAGNOSES:  1.  Persistent diarrhea.  2.  Acute renal failure secondary to prerenal azotemia/volume depletion.  3.  Presyncope secondary to hypovolemia.  4.  Hypertension with low-normal blood pressures during the hospital course.   SECONDARY DISCHARGE DIAGNOSES:  1.  Hypertension.  2.  Gastroesophageal reflux disease.  3.  Depression.  4.  Anxiety.  5.  Status post right rotator cuff surgery x2.  6.  Status post C5-C6 disk surgery with hardware placement.   DISCHARGE MEDICATIONS:  1.  Lomotil one to two tablets every 4 hours as needed.  2.  Flagyl 250 mg q.i.d. x7 days.  3.  Do not take Voltaren yet.  4.  Do not take Altace yet.  5.  Toprol-XL 25 mg daily, restart in 2 days.  6.  Flexeril 10 mg b.i.d.  7.  Hydrocodone/APAP 5/500 mg one to two tablets t.i.d.  8.  Prevacid 30 mg daily.  9.  Xanax 0.5 mg b.i.d. to t.i.d. p.r.n.  10. Cymbalta 60 mg daily.   DISCHARGE DISPOSITION:  The patient was discharged to home in improved and  stable condition.  He was advised to follow up with his primary care  physician, Dr. Doristine Black, in 5-7 days.  He was advised to follow up with Dr.  Yancey Black per Dr. Lamar Black recommendation in 2-3 weeks (if symptoms  persist).   CONSULTATIONS:  Gastroenterologist, Aaron Black.   HISTORY OF PRESENT ILLNESS:  The patient is a 55 year old man with a past  medical history significant for hypertension, gastroesophageal reflux  disease, and degenerative joint disease, who presented to the emergency  department on June4,2007, with a chief complaint of diarrhea.  The patient's  diarrhea  started approximately 3 weeks ago.  It has been unrelenting.  It is  described as watery and brownish in color.  He has approximately 5-10 bowel  movements per day.  The diarrhea is associated with occasional crampy lower  abdominal pain.  No nausea or vomiting.  No bright red blood per rectum.  No  melena.  The patient was treated with amoxicillin approximately 2 months ago  for a dental procedure.  No recent travel.   For additional details please see the dictated history and physical by Dr.  Isidor Black on [phone].   HOSPITAL COURSE:  #1 - PERSISTENT DIARRHEA/VOLUME DEPLETION/ACUTE RENAL  FAILURE/PRESYNCOPE SECONDARY TO HYPOVOLEMIA.  On admission, the patient  complained of generalized weakness and dizziness, particularly when he stood  up. His blood pressure was 92/61 and his heart rate was 68 beats per minute.  He was afebrile on admission.  Neurologically, there were no focal deficits.  His lab data on admission revealed a BUN of 23 and  a creatinine of 2.4.  His  white blood cell count was within normal limits at 6.5.  The patient was  bolused a liter of normal saline in the emergency department.  Following  admission, he was continued on IV fluids with normal saline at 125 mL an  hour.  His diarrhea was treated with Flora-Q one tablet t.i.d. and Imodium 2  mg t.i.d.  He was started on a clear liquid diet.  Stool specimens were  collected to rule out C. difficile. The stools were negative for C.  difficile toxin x2.  In addition, no wbc's were present in the stool  specimen.   The morning following hospital admission, the diarrhea actually subsided a  little.  He was rechallenged on a low-residue diet.  However, following the  restart of solid foods, the diarrhea returned.  Subsequently,  gastroenterologist, Dr. Marina Black, was consulted.  Per Dr. Lamar Black assessment,  the patient had a noninflammatory acute diarrheal illness.  Possible causes  included enterovirus which can be  symptomatic for 6-8 weeks, atypical  pathogens such as Giardia, or possibly microscopic colitis secondary to  NSAIDs.  He recommended starting the patient on Flagyl 250 mg q.i.d. for 7  days.  He also added Lomotil one to two tablets every 4 hours as needed.  He  advised the patient to minimize NSAID use for now.  If the patient's  symptoms persist after 2-3 more weeks, the patient was advised to follow up  with Dr. Marina Black for further evaluation and management.   Following volume repletion, the patient's dizziness and generalized weakness  resolved.  His BUN improved to 7 and his creatinine improved to 1.7 prior  hospital discharge.   #2 - LOW-NORMAL BLOOD PRESSURES WITH A HISTORY OF HYPERTENSION.  The patient  was relatively hypotensive on admission.  Altace and Toprol-XL were withheld  during hospital course.  Following volume repletion, his blood pressures did  improve to a range of 100-120 systolically.  The patient was advised to  restart the Toprol-XL in 2 days and to withhold taking Altace until he is  reevaluated by his primary care physician in 5-7 days.      Elliot Cousin, M.D.  Electronically Signed     DF/MEDQ  D:  12/27/2005  T:  12/28/2005  Job:  [id number]   cc:   [name], M.D.  Fax: [phone]   Wilhemina Bonito. Aaron Black, M.D. [facility]  [address]  [place]  [place] [address]

## 2010-12-09 NOTE — Op Note (Signed)
NAME:  JONMARC, BODKIN NO.:  [id number]   MEDICAL RECORD NO.:  [id number]                   PATIENT TYPE:  OIB   LOCATION:  2899                                 FACILITY:  [facility]   PHYSICIAN:  Danae Orleans. Venetia Maxon, M.D.               DATE OF BIRTH:  1956-03-13   DATE OF PROCEDURE:  08/06/2003  DATE OF DISCHARGE:                                 OPERATIVE REPORT   PREOPERATIVE DIAGNOSES:  Herniated cervical disk with cervical spondylosis,  degenerative disk disease and radiculopathy C5-6 and C6-7 levels.   POSTOPERATIVE DIAGNOSES:  Herniated cervical disk with cervical spondylosis,  degenerative disk disease and radiculopathy C5-6 and C6-7 levels.   PROCEDURE:  Anterior cervical decompression and fusion C5-6 and C6-7 levels  with allograft bone graft and anterior cervical plate.   SURGEON:  Danae Orleans. Venetia Maxon, M.D.   ASSISTANT:  Cristi Loron, M.D.   ANESTHESIA:  General endotracheal anesthesia.   ESTIMATED BLOOD LOSS:  Minimal.   COMPLICATIONS:  None.   DISPOSITION:  To recovery.   INDICATIONS FOR PROCEDURE:  Gram Siedlecki is a 55 year old man with a  herniated cervical disk and cervical spondylosis with neck pain, radicular  pain at C5-6 and  C6-7 levels. It was elected to take him to surgery for  anterior cervical decompression and fusion at the C5-6 and  C6-7 levels.   DESCRIPTION OF PROCEDURE:  Mr. Tutterow is brought to the operating room.  Following the satisfactory and uncomplicated induction of general  endotracheal anesthesia and placement of intravenous lines, the patient was  placed in a supine position on the operating table.  His neck was placed in  slight extension, he was placed in 10 pounds of halter traction.  His  anterior neck was then prepped and draped in the usual sterile fashion.  The  area for the incision was infiltrated with 0.25% Marcaine and 0.5% lidocaine  with 1:100,000 epinephrine. An incision was made in the midline  to the  anterior border of the sternocleidomastoid muscle, carried sharply through  the platysmal layers.  Sub platysmal dissection was performed exposing the  anterior border of the sternocleidomastoid muscle keeping the carotid sheath  lateral, trachea and esophagus medial. The anterior cervical spine was  identified and a bent spinal needle was placed to what was felt to be the C5-  6 level.  Intraoperative x-ray confirmed this to be the C5-6 level.  Subsequently the longus colli muscles were taken down from the anterior  cervical spine from the C5 through C7 levels using electrocautery and Key  elevator bilaterally.  Subsequently the shadow line retractor was placed  along with abdominal retractor. The large [employer] were removed using  Leksell rongeur at C5-6 and C6-7 levels. The interspaces were incised and  disk material was removed in piecemeal fashion. Both levels were quite  spondylitis. Disk space spreaders were placed and lateral disk was removed  grossly using a variety of Carlen microcurette's. Subsequently the  microscope was brought onto the field using high powered microdissection  technique. The endplates of C5 and C6 were decorticated using a high speed  drill and a tube equivalent bur. Endplates along with uncinate spurs. The  posterior longitudinal ligament was then incised and removed  in a piecemeal  fashion using a variety of 2 and 3 mm gold tip Kerrison rongeurs.  Both C6  nerve roots were widely decompressed, hemostasis was assured with Gelfoam  soak and thrombin. An 8 mm corticocancellous bone graft was rehydrated after  appropriately sizing the trial sizer's and this was then placed in the  interspace and counter sunk appropriately. Attention was then turned to the  C6-7 level where a similar decompression was performed. Again there were  fairly large spurs which were drilled down and both C7 nerve roots were  decompressed. Hemostasis again assured at this  level and a similarly sized  corticocancellous bone graft was placed and counter sunk appropriately.  The  microscope was then taken out of the field and a 44 mm triennia anterior  cervical plate was then affixed to the anterior cervical spine using 14 mm  variable angled screws, 2 at C5, 2 at C6, and 2 at C7.  __________excellent  purchase, locking mechanisms were engaged.  Final x-ray confirmed  positioning of bone graft and anterior cervical plate. Prior to placing the  screws, the halter traction weight was removed.  The wound was then  copiously irrigated with bacitracin and saline, soft tissues inspected and  found to be in good repair.  The platysmal layer was then closed with 3-0  Vicryl sutures, skin edges reapproximated with 4-0 Vicryl subcuticular  stitch. The wound was dressed with Dermabond. The patient was extubated in  the operating room and taken to the recovery room in stable, satisfactory  condition having tolerated his operation well.  Counts were correct at the  end of the case.                                               Danae Orleans. Venetia Maxon, M.D.    JDS/MEDQ  D:  08/06/2003  T:  08/06/2003  Job:  [id number]

## 2010-12-09 NOTE — Assessment & Plan Note (Signed)
[name]                         GASTROENTEROLOGY OFFICE NOTE   Aaron Black, Aaron Black                      MRN:          [id number]  DATE:11/20/2006                            DOB:          July 28, 1955    REASON FOR CONSULTATION:  Severe heartburn and indigestion, as well as  intermittent diarrhea.   HISTORY:  This is a 55 year old white male with a history of  hypertension, anxiety with depression, arthritis, and sleep apnea.  He  presents today regarding the above listed complaints.  The patient was  seen in consultation during hospitalization in June 2007 for persistent  diarrhea.  He was felt to have an acute viral illness, though was  treated empirically with Lomotil and Flagyl, and he was to follow up in  the office, but failed to do so.  First, he complains of indigestion and  heartburn for greater than 1 year.  Associated with this is midsternal  burning discomfort.  On proton pump inhibitors, the symptoms are well  controlled.  Off the medications, symptoms return.  He has issues  regarding affordability of his medications.  He reports intermittent  problems with increased intestinal gas associated with bloating  discomfort, and intermittent diarrhea.  His stools are generally solid,  though occasionally loose.  He has seen some intermittent minor blood  per rectum associated with his bowel movements.  He is noted to have  hemorrhoids.  He has not had previous GI evaluations.   PAST MEDICAL HISTORY:  As above.   PAST SURGICAL HISTORY:  1. Cervical disk disease, for which he underwent surgery in 2005.  2. Rotator cuff surgery x2.  3. History of ruptured disk in the lower back.   ALLERGIES:  PERCODAN, FOR WHICH HE HAD A RASH.   CURRENT MEDICATIONS:  1. Toprol XL 25 mg daily.  2. Aceon 8 mg daily.  3. Hydrocodone APAP p.r.n.  4. Cymbalta 60 mg daily.  5. Xanax 0.5 mg daily.  6. Voltaren 75 mg daily.  7. Flexeril 10 mg daily.  8.  Nexium 40 mg daily.   FAMILY HISTORY:  Father and mother with colon polyps.  Also, mother with  heart disease and breast cancer.   SOCIAL HISTORY:  Patient is married without children.  He is accompanied  by his wife.  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  He smokes up to a pack and a half of cigarettes per day.  Has  alcoholic beverages intermittently.   REVIEW OF SYSTEMS:  Per diagnostic evaluation form.   PHYSICAL EXAMINATION:  A somewhat anxious gentleman, in no acute  distress.  Blood pressure 144/96.  Heart rate 76.  Weight is 179.2 pounds.  He is 5  feet 8 inches in height.  HEENT:  Sclerae are anicteric.  Conjunctivae are pink.  Oral mucosa is  intact.  No adenopathy.  LUNGS:  Clear.  HEART:  Regular.  ABDOMEN:  Soft without tenderness, mass or hernia.  Good bowel sounds  heard.  EXTREMITIES:  Without edema.   IMPRESSION:  1. Chronic gastroesophageal reflux disease.  Symptoms controlled on      Nexium.  2. Intermittent problems with bloating and alternating bowel habits      after what appeared an acute viral gastrointestinal disorder.  This      may represent postinfectious irritable bowel syndrome.  3. Minor intermittent rectal bleeding, likely due to known      hemorrhoids.  4. Multiple general medical problems, under the care of Dr. Doristine Counter.   RECOMMENDATIONS:  1. Colonoscopy to evaluate change in bowel habits, rectal bleeding,      and provide neoplasia screening.  The nature of the procedure, as      well as the risks, benefits, and alternatives have been reviewed.      He understood and agreed to proceed.  2. Reflux precautions.  3. Daily proton pump inhibitor.  Additional samples of Nexium have      been provided.  4. Daily probiotic for lower GI complaints.  5. Schedule upper endoscopy to exclude Barrett's esophagus.  6. Ongoing general medical care with Dr. Doristine Counter.     Wilhemina Bonito. [name], MD  Electronically Signed     JNP/MedQ  DD: 11/27/2006  DT: 11/27/2006  Job #: [id number]   cc:   [name], M.D.

## 2010-12-09 NOTE — H&P (Signed)
NAME:  Aaron Black, Aaron Black NO.:  [id number]   MEDICAL RECORD NO.:  [id number]          PATIENT TYPE:  INP   LOCATION:  1823                         FACILITY:  [facility]   PHYSICIAN:  Isidor Holts, M.D.  DATE OF BIRTH:  01/26/1956   DATE OF ADMISSION:  12/25/2005  DATE OF DISCHARGE:                                HISTORY & PHYSICAL   PRIMARY CARE PHYSICIAN:  [name], M.D. , [facility].   CHIEF COMPLAINT:  Persistent diarrhea for the past 17 days, associated with  weakness and dizziness.   HISTORY OF THE PRESENT ILLNESS:  This is a 55 year old male.  For past  medical history, see below. According to the patient and his spouse, who  helped him supply the history, the patient has had diarrhea for  approximately 17 days.  The stool is described as watery and brownish,  without blood or mucous, occurring about five to 10 times a day.  This is  associated with crampy lower abdominal pain, but no vomiting.  The patient  has no history of recent travel and there is no clustering of cases.  He has  no antecedent history of antibiotic treatment.   The patient's PMD approximately one week ago placed him on a low-residue  diet without any improvement.  He was asked to collect stool samples, which  he took to his PMD in the A.M. of December 25, 2005.  Because of persistence of  his symptoms he was referred to the emergency department.   PAST MEDICAL HISTORY:  1.  Hypertension.  2.  GERD.  3.  Depression and anxiety.  4.  Status post right rotator cuff surgery times two.  5.  Status post C5 and C6 disk surgery with hardware placed.   MEDICATIONS:  1.  Altace 10 mg p.o. daily.  2.  Flexeril 10 mg p.o. twice a day.  3.  Hydrocodone/APAP 5/500, 2 p.o. three times a day.  4.  Prevacid 30 mg p.o. daily.  5.  Toprol XL 25 mg p.o. daily.  6.  Voltaren 75 mg p.o. twice a day.  7.  Xanax 0.5 mg p.o. twice a day to three times a day.  8.  Cymbalta 60 mg p.o.  daily.  9.  Magnesium 250 mg p.o. daily. This was stopped by the patient      approximately two weeks ago, because it worsened his diarrhea.   ALLERGIES:  1.  PERCOCET AND PERCODAN; this cause a rash.  2.  PREDNISONE; this causes Jitters.   SOCIAL HISTORY:  The patient is unemployed.  He is married.  He has no  offspring.  He smokes about 1-1.5 packages of cigarettes per day.  He drinks  alcohol only occasionally.   FAMILY HISTORY:  The patient has one brother and one sister who are alive  and well.  He had two brothers who died, secondary to MVAs approximately  five years apart.  His living siblings are hypertensive.  His mother is  hypertensive and history of coronary artery disease status post stent.  Father has hypertension.  Family history is otherwise  noncontributory.   PHYSICAL EXAMINATION:  VITAL SIGNS:  Temperature 98.1, pulse 68/minute and  regular, respiratory rate 18 and blood pressure 92/61 mmHg; and, after 1  liter of aminophylline in the emergency department recheck blood pressure is  112/69 mmHg.  Pulse oximetry 100% on room air.  GENERAL APPEARANCE:  The patient does not appear to be in obvious acute  distress.  He is alert and oriented, communicative, and is not short of  breath at rest.  HEENT:  No clinical pallor, no jaundice and no conjunctival injection.  Throat: mucous membranes are quite dry.  NECK:  The neck is supple.  JVP not seen.  No palpable lymphadenopathy.  No  palpable carotid bruits.  CHEST:  The chest to clear to auscultation.  No wheezes.  No crackles.  HEART:  The heart sounds 1 and 2 are heard.  Rhythm regular.  No murmurs.  ABDOMEN:  The abdomen is full, soft and nontender.  There is no palpable  organomegaly.  No palpable masses.  Bowel sounds are quite brisk and noisy.  EXTREMITIES:  Lower extremity exam shows no pitting edema.  Palpable  peripheral pulses.  MUSCULOSKELETAL SYSTEM:  The musculoskeletal system is quite unremarkable.   NEUROLOGIC EXAMINATION:  Central nervous system shows no significant  neurological deficit on gross examination.   LABORATORY DATA/INVESTIGATIONS:  CBC; WBC 6.5, hemoglobin 12.3, hematocrit  34.6 and platelets 290,000.  Electrolytes; sodium 132, potassium 4.4,  chloride 103, CO2 24, BUN 23, creatinine 2.4, and glucose 104.   ASSESSMENT AND PLAN:  1.  Persistent diarrhea of unclear etiology.   We will admit the patient for intravenous hydration, workup with stool  studies and start antidiarrheals and antibiotics.  Because of the  persistence of the diarrhea and its duration we feel that gastrointestinal  input might well be useful here.   1.  Acute renal failure/dehydration secondary to number one.  We shall rehydrate.  We will hold nonsteroidal anti-inflammatory drugs, and  check BUN and creatinine levels.   1.  History of hypertension.  The patient was mildly hypotensive on presentation, however, this improved  with intravenous fluids.  For now we shall hold the patient's Altace and beta blocker.   1.  Degenerative joint disease.  The patient will be placed on p.r.n. analgesics.   1.  Smoking history.  We will council him appropriately and place a Nicoderm CQ patch.   Further management will depend on clinical course.      Isidor Holts, M.D.  Electronically Signed     CO/MEDQ  D:  12/25/2005  T:  12/26/2005  Job:  [id number]   cc:   [name], M.D.  Fax: [phone]

## 2011-01-04 ENCOUNTER — Encounter: Payer: Self-pay | Admitting: Internal Medicine

## 2011-01-06 ENCOUNTER — Other Ambulatory Visit: Payer: Self-pay

## 2011-01-06 ENCOUNTER — Ambulatory Visit (INDEPENDENT_AMBULATORY_CARE_PROVIDER_SITE_OTHER): Payer: Medicare HMO | Admitting: Internal Medicine

## 2011-01-06 DIAGNOSIS — D649 Anemia, unspecified: Secondary | ICD-10-CM

## 2011-01-06 DIAGNOSIS — E538 Deficiency of other specified B group vitamins: Secondary | ICD-10-CM

## 2011-01-06 MED ORDER — DILTIAZEM HCL ER 240 MG PO CP24: 240.0000 mg | ORAL_CAPSULE | Freq: Every day | ORAL | Status: DC

## 2011-01-06 MED ORDER — LISINOPRIL 20 MG PO TABS: 20.0000 mg | ORAL_TABLET | Freq: Every day | ORAL | Status: DC

## 2011-01-06 MED ORDER — CYANOCOBALAMIN 1000 MCG/ML IJ SOLN
1000.0000 ug | Freq: Once | INTRAMUSCULAR | Status: AC
  Administered 2011-01-06: 1000 ug via INTRAMUSCULAR

## 2011-01-06 NOTE — Telephone Encounter (Signed)
Faxed to [facility] - ph [phone]  Fax [phone]

## 2011-01-16 ENCOUNTER — Other Ambulatory Visit: Payer: Self-pay | Admitting: Internal Medicine

## 2011-01-16 MED ORDER — ALPRAZOLAM 0.5 MG PO TABS: 0.5000 mg | ORAL_TABLET | Freq: Three times a day (TID) | ORAL | Status: DC | PRN

## 2011-01-16 NOTE — Telephone Encounter (Signed)
Pt called and is req refill of Alprazolam .5 mg to [facility] Fax # [phone] or call in to phone # [phone]. Pls include pts name, dob, mem id # [id number]

## 2011-01-16 NOTE — Telephone Encounter (Signed)
Faxed to [facility]

## 2011-02-06 ENCOUNTER — Ambulatory Visit (INDEPENDENT_AMBULATORY_CARE_PROVIDER_SITE_OTHER): Payer: Medicare HMO | Admitting: Internal Medicine

## 2011-02-06 DIAGNOSIS — E539 Vitamin B deficiency, unspecified: Secondary | ICD-10-CM

## 2011-02-06 MED ORDER — CYANOCOBALAMIN 1000 MCG/ML IJ SOLN
1000.0000 ug | Freq: Once | INTRAMUSCULAR | Status: AC
  Administered 2011-02-06: 1000 ug via INTRAMUSCULAR

## 2011-03-02 ENCOUNTER — Other Ambulatory Visit: Payer: Self-pay | Admitting: Neurosurgery

## 2011-03-02 DIAGNOSIS — M5126 Other intervertebral disc displacement, lumbar region: Secondary | ICD-10-CM

## 2011-03-09 ENCOUNTER — Ambulatory Visit
Admission: RE | Admit: 2011-03-09 | Discharge: 2011-03-09 | Disposition: A | Discharge status: Home / Self Care | Payer: Medicare HMO | Source: Ambulatory Visit | Attending: Neurosurgery | Admitting: Neurosurgery

## 2011-03-09 ENCOUNTER — Ambulatory Visit (INDEPENDENT_AMBULATORY_CARE_PROVIDER_SITE_OTHER): Payer: Medicare HMO | Admitting: Internal Medicine

## 2011-03-09 DIAGNOSIS — D519 Vitamin B12 deficiency anemia, unspecified: Secondary | ICD-10-CM

## 2011-03-09 DIAGNOSIS — M5126 Other intervertebral disc displacement, lumbar region: Secondary | ICD-10-CM

## 2011-03-09 DIAGNOSIS — D518 Other vitamin B12 deficiency anemias: Secondary | ICD-10-CM

## 2011-03-09 MED ORDER — CYANOCOBALAMIN 1000 MCG/ML IJ SOLN
1000.0000 ug | Freq: Once | INTRAMUSCULAR | Status: AC
  Administered 2011-03-09: 1000 ug via INTRAMUSCULAR

## 2011-04-10 ENCOUNTER — Other Ambulatory Visit: Payer: Self-pay

## 2011-04-10 ENCOUNTER — Ambulatory Visit (INDEPENDENT_AMBULATORY_CARE_PROVIDER_SITE_OTHER): Payer: Medicare HMO | Admitting: Internal Medicine

## 2011-04-10 DIAGNOSIS — Z23 Encounter for immunization: Secondary | ICD-10-CM

## 2011-04-10 DIAGNOSIS — Z0000 Encounter for general adult medical examination without abnormal findings: Secondary | ICD-10-CM

## 2011-04-10 DIAGNOSIS — E538 Deficiency of other specified B group vitamins: Secondary | ICD-10-CM

## 2011-04-10 MED ORDER — CYANOCOBALAMIN 1000 MCG/ML IJ SOLN
1000.0000 ug | Freq: Once | INTRAMUSCULAR | Status: AC
  Administered 2011-04-10: 1000 ug via INTRAMUSCULAR

## 2011-04-10 MED ORDER — SIMVASTATIN 20 MG PO TABS: 20.0000 mg | ORAL_TABLET | Freq: Every day | ORAL | Status: DC

## 2011-04-17 LAB — BASIC METABOLIC PANEL
Calcium: 9.5
Creatinine, Ser: 1.17
GFR calc Af Amer: >60
GFR calc non Af Amer: >60
Glucose, Bld: 99
Sodium: 135

## 2011-04-17 LAB — CBC
Hemoglobin: 14.3
RDW: 13.1

## 2011-05-10 ENCOUNTER — Ambulatory Visit (INDEPENDENT_AMBULATORY_CARE_PROVIDER_SITE_OTHER): Payer: Medicare HMO | Admitting: Internal Medicine

## 2011-05-10 ENCOUNTER — Other Ambulatory Visit: Payer: Self-pay

## 2011-05-10 DIAGNOSIS — E538 Deficiency of other specified B group vitamins: Secondary | ICD-10-CM

## 2011-05-10 MED ORDER — ALPRAZOLAM 0.5 MG PO TABS: 0.5000 mg | ORAL_TABLET | Freq: Three times a day (TID) | ORAL | Status: DC | PRN

## 2011-05-10 MED ORDER — CYANOCOBALAMIN 1000 MCG/ML IJ SOLN
1000.0000 ug | Freq: Once | INTRAMUSCULAR | Status: AC
  Administered 2011-05-10: 1000 ug via INTRAMUSCULAR

## 2011-05-10 NOTE — Telephone Encounter (Signed)
Faxed refill to [place] [phone]

## 2011-06-05 ENCOUNTER — Ambulatory Visit (INDEPENDENT_AMBULATORY_CARE_PROVIDER_SITE_OTHER): Payer: Medicare HMO | Admitting: Internal Medicine

## 2011-06-05 DIAGNOSIS — E538 Deficiency of other specified B group vitamins: Secondary | ICD-10-CM

## 2011-06-05 MED ORDER — CYANOCOBALAMIN 1000 MCG/ML IJ SOLN
1000.0000 ug | Freq: Once | INTRAMUSCULAR | Status: AC
  Administered 2011-06-05: 1000 ug via INTRAMUSCULAR

## 2011-06-07 ENCOUNTER — Other Ambulatory Visit: Payer: Self-pay | Admitting: Otolaryngology

## 2011-06-07 DIAGNOSIS — J342 Deviated nasal septum: Secondary | ICD-10-CM

## 2011-06-07 DIAGNOSIS — R221 Localized swelling, mass and lump, neck: Secondary | ICD-10-CM

## 2011-06-09 ENCOUNTER — Ambulatory Visit: Payer: Medicare HMO | Admitting: Internal Medicine

## 2011-06-09 ENCOUNTER — Ambulatory Visit
Admission: RE | Admit: 2011-06-09 | Discharge: 2011-06-09 | Disposition: A | Discharge status: Home / Self Care | Payer: Medicare HMO | Source: Ambulatory Visit | Attending: Otolaryngology | Admitting: Otolaryngology

## 2011-06-09 DIAGNOSIS — J342 Deviated nasal septum: Secondary | ICD-10-CM

## 2011-06-09 DIAGNOSIS — R220 Localized swelling, mass and lump, head: Secondary | ICD-10-CM

## 2011-06-09 DIAGNOSIS — R221 Localized swelling, mass and lump, neck: Secondary | ICD-10-CM

## 2011-06-09 MED ORDER — IOHEXOL 300 MG/ML  SOLN
75.0000 mL | Freq: Once | INTRAMUSCULAR | Status: AC | PRN
  Administered 2011-06-09: 75 mL via INTRAVENOUS

## 2011-07-04 ENCOUNTER — Ambulatory Visit (INDEPENDENT_AMBULATORY_CARE_PROVIDER_SITE_OTHER): Payer: Medicare HMO | Admitting: Internal Medicine

## 2011-07-04 ENCOUNTER — Other Ambulatory Visit: Payer: Self-pay | Admitting: Internal Medicine

## 2011-07-04 DIAGNOSIS — D510 Vitamin B12 deficiency anemia due to intrinsic factor deficiency: Secondary | ICD-10-CM

## 2011-07-04 MED ORDER — CYANOCOBALAMIN 1000 MCG/ML IJ SOLN
1000.0000 ug | Freq: Once | INTRAMUSCULAR | Status: AC
  Administered 2011-07-04: 1000 ug via INTRAMUSCULAR

## 2011-07-04 MED ORDER — LISINOPRIL 20 MG PO TABS: 20.0000 mg | ORAL_TABLET | Freq: Every day | ORAL | Status: DC

## 2011-07-04 MED ORDER — DILTIAZEM HCL ER 240 MG PO CP24: 240.0000 mg | ORAL_CAPSULE | Freq: Every day | ORAL | Status: DC

## 2011-07-04 NOTE — Telephone Encounter (Signed)
Faxed to [facility]

## 2011-07-04 NOTE — Telephone Encounter (Signed)
Pt requesting refill on: diltiazem (DILACOR XR) 240 MG 24 hr capsule lisinopril (PRINIVIL,ZESTRIL) 20 MG tablet   Pt was not sure of the pharmacy name said it was out of [place]

## 2011-07-26 ENCOUNTER — Telehealth: Payer: Self-pay

## 2011-07-26 ENCOUNTER — Ambulatory Visit (INDEPENDENT_AMBULATORY_CARE_PROVIDER_SITE_OTHER): Payer: Medicare HMO | Admitting: Internal Medicine

## 2011-07-26 ENCOUNTER — Encounter: Payer: Self-pay | Admitting: Internal Medicine

## 2011-07-26 DIAGNOSIS — J069 Acute upper respiratory infection, unspecified: Secondary | ICD-10-CM

## 2011-07-26 DIAGNOSIS — E538 Deficiency of other specified B group vitamins: Secondary | ICD-10-CM

## 2011-07-26 DIAGNOSIS — D649 Anemia, unspecified: Secondary | ICD-10-CM

## 2011-07-26 DIAGNOSIS — I10 Essential (primary) hypertension: Secondary | ICD-10-CM

## 2011-07-26 MED ORDER — CYANOCOBALAMIN 1000 MCG/ML IJ SOLN
1000.0000 ug | Freq: Once | INTRAMUSCULAR | Status: AC
  Administered 2011-07-26: 1000 ug via INTRAMUSCULAR

## 2011-07-26 MED ORDER — HYDROCODONE-HOMATROPINE 5-1.5 MG/5ML PO SYRP: 5.0000 mL | ORAL_SOLUTION | Freq: Four times a day (QID) | ORAL | Status: AC | PRN

## 2011-07-26 NOTE — Progress Notes (Signed)
  Subjective:    Patient ID: Aaron Black, male    DOB: [birth year], 56 y.o.   MRN: [id number]  HPI 56 year old patient with a five-day history of episodic fever chills chest congestion and nonproductive cough. He has fatigue and a general sense of unwellness. His wife has a similar illness. Denies any shortness breath wheezing or purulent sputum production no chest pain    Review of Systems  Constitutional: Positive for fever, chills, activity change, appetite change and fatigue.  Respiratory: Positive for cough.        Objective:   Physical Exam  Constitutional: He is oriented to person, place, and time. He appears well-developed.  HENT:  Head: Normocephalic.  Right Ear: External ear normal.  Left Ear: External ear normal.  Eyes: Conjunctivae and EOM are normal.  Neck: Normal range of motion.  Cardiovascular: Normal rate and normal heart sounds.   Pulmonary/Chest: Effort normal and breath sounds normal. No respiratory distress. He has no wheezes.       O2 saturation 96  Abdominal: Bowel sounds are normal.  Musculoskeletal: Normal range of motion. He exhibits no edema and no tenderness.  Neurological: He is alert and oriented to person, place, and time.  Psychiatric: He has a normal mood and affect. His behavior is normal.          Assessment & Plan:    Viral URI. He will continue taking his anti-inflammatory medication. We'll treat his cough symptomatically. Hypertension well controlled

## 2011-07-26 NOTE — Progress Notes (Signed)
Addended by: Duard Brady I on: 07/26/2011 09:19 AM   Modules accepted: Orders

## 2011-07-26 NOTE — Patient Instructions (Signed)
Get plenty of rest, Drink lots of  clear liquids, and use diclofenac  twice daily  for fever and discomfort.    Call or return to clinic prn if these symptoms worsen or fail to improve as anticipated.

## 2011-07-26 NOTE — Telephone Encounter (Signed)
Pt was seen today and pt was treated for URI.  Pt is still having some trouble breathing and request a inhaler be called in to pharmacy.  Pls advise.

## 2011-07-27 MED ORDER — ALBUTEROL SULFATE HFA 108 (90 BASE) MCG/ACT IN AERS: 2.0000 | INHALATION_SPRAY | Freq: Four times a day (QID) | RESPIRATORY_TRACT | Status: DC | PRN

## 2011-07-27 NOTE — Telephone Encounter (Signed)
Rx sent to pharmacy for inhaler.

## 2011-07-27 NOTE — Telephone Encounter (Signed)
proair MDI

## 2011-08-08 ENCOUNTER — Ambulatory Visit: Payer: Medicare HMO | Admitting: Internal Medicine

## 2011-08-25 ENCOUNTER — Ambulatory Visit (INDEPENDENT_AMBULATORY_CARE_PROVIDER_SITE_OTHER): Payer: Medicare HMO | Admitting: Internal Medicine

## 2011-08-25 DIAGNOSIS — E538 Deficiency of other specified B group vitamins: Secondary | ICD-10-CM

## 2011-08-25 MED ORDER — CYANOCOBALAMIN 1000 MCG/ML IJ SOLN
1000.0000 ug | Freq: Once | INTRAMUSCULAR | Status: AC
  Administered 2011-08-25: 1000 ug via INTRAMUSCULAR

## 2011-09-21 ENCOUNTER — Other Ambulatory Visit (INDEPENDENT_AMBULATORY_CARE_PROVIDER_SITE_OTHER): Payer: Medicare HMO

## 2011-09-21 DIAGNOSIS — Z125 Encounter for screening for malignant neoplasm of prostate: Secondary | ICD-10-CM

## 2011-09-21 DIAGNOSIS — Z0000 Encounter for general adult medical examination without abnormal findings: Secondary | ICD-10-CM

## 2011-09-21 DIAGNOSIS — E785 Hyperlipidemia, unspecified: Secondary | ICD-10-CM

## 2011-09-21 LAB — CBC WITH DIFFERENTIAL/PLATELET
Basophils Absolute: 0 10*3/uL (ref 0.0–0.1)
Eosinophils Absolute: 0.1 10*3/uL (ref 0.0–0.7)
Eosinophils Relative: 1.6 % (ref 0.0–5.0)
HCT: 41.5 % (ref 39.0–52.0)
Lymphs Abs: 2 10*3/uL (ref 0.7–4.0)
MCV: 92.2 fl (ref 78.0–100.0)
Monocytes Absolute: 0.7 10*3/uL (ref 0.1–1.0)
Neutrophils Relative %: 59.7 % (ref 43.0–77.0)
Platelets: 211 10*3/uL (ref 150.0–400.0)
RDW: 14 % (ref 11.5–14.6)
WBC: 7 10*3/uL (ref 4.5–10.5)

## 2011-09-21 LAB — POCT URINALYSIS DIPSTICK
Blood, UA: NEGATIVE
Ketones, UA: NEGATIVE
Protein, UA: NEGATIVE
Spec Grav, UA: 1.015
Urobilinogen, UA: 0.2
pH, UA: 5

## 2011-09-21 LAB — LDL CHOLESTEROL, DIRECT: Direct LDL: 138.1 mg/dL

## 2011-09-21 LAB — BASIC METABOLIC PANEL
BUN: 18 mg/dL (ref 6–23)
Chloride: 100 mEq/L (ref 96–112)
Creatinine, Ser: 1.3 mg/dL (ref 0.4–1.5)
Glucose, Bld: 87 mg/dL (ref 70–99)
Potassium: 4.5 mEq/L (ref 3.5–5.1)

## 2011-09-21 LAB — HEPATIC FUNCTION PANEL
ALT: 30 U/L (ref 0–53)
Bilirubin, Direct: 0.1 mg/dL (ref 0.0–0.3)
Total Bilirubin: 0.5 mg/dL (ref 0.3–1.2)

## 2011-09-21 LAB — LIPID PANEL
Cholesterol: 227 mg/dL — ABNORMAL HIGH (ref 0–200)
HDL: 61.3 mg/dL (ref >39.00)
Total CHOL/HDL Ratio: 4
Triglycerides: 225 mg/dL — ABNORMAL HIGH (ref 0.0–149.0)
VLDL: 45 mg/dL — ABNORMAL HIGH (ref 0.0–40.0)

## 2011-09-21 LAB — TSH: TSH: 0.5 u[IU]/mL (ref 0.35–5.50)

## 2011-09-22 ENCOUNTER — Other Ambulatory Visit: Payer: Self-pay

## 2011-09-22 MED ORDER — ALPRAZOLAM 0.5 MG PO TABS: 0.5000 mg | ORAL_TABLET | Freq: Three times a day (TID) | ORAL | Status: DC | PRN

## 2011-09-22 NOTE — Telephone Encounter (Signed)
Faxed to [facility] 

## 2011-09-25 ENCOUNTER — Encounter: Payer: Self-pay | Admitting: Internal Medicine

## 2011-09-25 ENCOUNTER — Ambulatory Visit (INDEPENDENT_AMBULATORY_CARE_PROVIDER_SITE_OTHER): Payer: Medicare HMO | Admitting: Internal Medicine

## 2011-09-25 DIAGNOSIS — Z86010 Personal history of colonic polyps: Secondary | ICD-10-CM

## 2011-09-25 DIAGNOSIS — E785 Hyperlipidemia, unspecified: Secondary | ICD-10-CM

## 2011-09-25 DIAGNOSIS — E538 Deficiency of other specified B group vitamins: Secondary | ICD-10-CM

## 2011-09-25 DIAGNOSIS — Z0000 Encounter for general adult medical examination without abnormal findings: Secondary | ICD-10-CM

## 2011-09-25 DIAGNOSIS — F411 Generalized anxiety disorder: Secondary | ICD-10-CM

## 2011-09-25 DIAGNOSIS — Z23 Encounter for immunization: Secondary | ICD-10-CM

## 2011-09-25 DIAGNOSIS — I10 Essential (primary) hypertension: Secondary | ICD-10-CM

## 2011-09-25 DIAGNOSIS — Z720 Tobacco use: Secondary | ICD-10-CM | POA: Insufficient documentation

## 2011-09-25 MED ORDER — CYANOCOBALAMIN 1000 MCG/ML IJ SOLN
1000.0000 ug | Freq: Once | INTRAMUSCULAR | Status: AC
  Administered 2011-09-25: 1000 ug via INTRAMUSCULAR

## 2011-09-25 MED ORDER — SILDENAFIL CITRATE 100 MG PO TABS: 100.0000 mg | ORAL_TABLET | Freq: Every day | ORAL | Status: DC | PRN

## 2011-09-25 MED ORDER — ALBUTEROL SULFATE HFA 108 (90 BASE) MCG/ACT IN AERS: 2.0000 | INHALATION_SPRAY | Freq: Four times a day (QID) | RESPIRATORY_TRACT | Status: DC | PRN

## 2011-09-25 MED ORDER — DULOXETINE HCL 60 MG PO CPEP: 60.0000 mg | ORAL_CAPSULE | Freq: Every day | ORAL | Status: DC

## 2011-09-25 MED ORDER — DICLOFENAC SODIUM 75 MG PO TBEC: 75.0000 mg | DELAYED_RELEASE_TABLET | Freq: Two times a day (BID) | ORAL | Status: DC

## 2011-09-25 MED ORDER — ALPRAZOLAM 0.5 MG PO TABS: 0.5000 mg | ORAL_TABLET | Freq: Three times a day (TID) | ORAL | Status: DC | PRN

## 2011-09-25 MED ORDER — LISINOPRIL 20 MG PO TABS: 20.0000 mg | ORAL_TABLET | Freq: Every day | ORAL | Status: DC

## 2011-09-25 MED ORDER — SIMVASTATIN 20 MG PO TABS: 20.0000 mg | ORAL_TABLET | Freq: Every day | ORAL | Status: DC

## 2011-09-25 MED ORDER — DILTIAZEM HCL ER 240 MG PO CP24: 240.0000 mg | ORAL_CAPSULE | Freq: Every day | ORAL | Status: DC

## 2011-09-25 MED ORDER — OMEPRAZOLE 40 MG PO CPDR: 40.0000 mg | DELAYED_RELEASE_CAPSULE | Freq: Every day | ORAL | Status: DC

## 2011-09-25 NOTE — Patient Instructions (Signed)
Limit your sodium (Salt) intake    It is important that you exercise regularly, at least 20 minutes 3 to 4 times per week.  If you develop chest pain or shortness of breath seek  medical attention.  Smoking tobacco is very bad for your health. You should stop smoking immediately.  Return in 6 months for follow-up  

## 2011-09-25 NOTE — Progress Notes (Signed)
  Subjective:    Patient ID: Aaron Black, male    DOB: 02/16/1956, 56 y.o.   MRN: [id number]  HPI  56 year old patient has a history of hypertension and dyslipidemia who is seen today for an annual exam. He also has a history of gout osteoarthritis and is followed by Dr. Dareen Piano. He has a history of ongoing tobacco use.  1. Risk factors, based on past  M,S,F history-  cardiovascular risk factors include hypertension dyslipidemia and ongoing tobacco use  2.  Physical activities: Fairly sedentary due to due back pain and arthritis  3.  Depression/mood: History of anxiety disorder but no history of depression  4.  Hearing: No deficits  5.  ADL's: Independent in all aspects of daily living  6.  Fall risk: Low  7.  Home safety: No problems identified  8.  Height weight, and visual acuity; height and weight stable no change in visual acuity  9.  Counseling: Total smoking cessation encouraged  10. Lab orders based on risk factors: Laboratory profile including lipid panel reviewed  11. Referral : Follow Dr. Dareen Piano  And  neurosurgery  12. Care plan: Smoking cessation more regular exercise heart healthy diet encouraged 13. Cognitive assessment: Alert and oriented normal affect. No cognitive dysfunction.     Alcohol-Tobacco  Smoking Status: current  Smoking Cessation Counseling: yes   Allergies:  1) ! Percodan   Past History:  Past Medical History:  Reviewed history from 05/27/2009 and no changes required.  Current Problems:  DIVERTICULOSIS, COLON (ICD-562.10)  GERD (ICD-530.81)  HERNIATED DISC (ICD-722.2)  SLEEP APNEA (ICD-780.57)  ANXIETY (ICD-300.00)  ARTHRITIS (ICD-716.90)  HYPERTENSION (ICD-401.9)  Colonic polyps, hx of  B12 deficiency  Anemia-NOS  Hyperlipidemia  possible gout   Past Surgical History:  Reviewed history from 09/08/2008 and no changes required.  Back surgery - lower  Rotator Cuff x 25 February 1999, January 2002, July 2008, July 2009  Neck  surgery - c-spine x2 ; January 2005, March 2009  colonoscopy in 2008 (hyperplastic. 10 year  interval recommended-[place])  Family History:  Reviewed history from 04/28/2009 and no changes required.  Squamous cell carcinoma: Mother; died age 51; bone cancer, history colonic polyps  Family History of Diabetes: Father; age 30, history of hypertension, colonic polyps  Family History of Heart Disease: Mother  two brothers deceased from motor vehicle accident  one brother, hypertension  one sister in good health   Social History:  Reviewed history from 09/08/2008 and no changes required.  Married  Patient currently smokes.  Alcohol Use - yes  Daily Caffeine Use  Illicit Drug Use - no  Patient does not get regular exercise.  Retired   Review of Systems     Objective:   Physical Exam        Assessment & Plan:

## 2011-11-10 ENCOUNTER — Encounter: Payer: Self-pay | Admitting: Internal Medicine

## 2011-11-10 ENCOUNTER — Ambulatory Visit (INDEPENDENT_AMBULATORY_CARE_PROVIDER_SITE_OTHER): Payer: Medicare HMO | Admitting: Internal Medicine

## 2011-11-10 VITALS — BP 126/80 | Temp 97.8°F | Wt 167.0 lb

## 2011-11-10 DIAGNOSIS — I10 Essential (primary) hypertension: Secondary | ICD-10-CM

## 2011-11-10 DIAGNOSIS — M109 Gout, unspecified: Secondary | ICD-10-CM

## 2011-11-10 NOTE — Progress Notes (Signed)
  Subjective:    Patient ID: Aaron Black, male    DOB: 1956/07/01, 56 y.o.   MRN: [id number]  HPI  56 year old patient who presents with a two-day history of increasing pain swelling and excessive warmth involving his left knee. He does have a history of prior gout. He has treated hypertension which has been stable. No fever or chills. No recent knee trauma. He is followed closely by orthopedics    Review of Systems  Musculoskeletal: Positive for joint swelling and gait problem.       Objective:   Physical Exam  Constitutional: He appears well-developed and well-nourished. No distress.       Blood pressure controlled  Musculoskeletal: He exhibits edema and tenderness.       Right knee with a mild effusion with slightly tender to touch and warm          Assessment & Plan:   Inflammatory arthritis left knee probable gout. The patient declines steroid treatment. He does have focusing at home we'll as to take twice daily along with Voltaren twice daily. He does have analgesics. Hypertension stable   We'll call if not improved or follow up with orthopedic. He also has a rheumatologist

## 2011-11-10 NOTE — Patient Instructions (Addendum)
Diclofenac one twice daily Colchicine 0.6 mg  one twice daily  You  may move around, but avoid painful motions and activities.  Apply ice to the sore area for 15 to 20 minutes 3 or 4 times daily for the next two to 3 days.  Call or return to clinic prn if these symptoms worsen or fail to improve as anticipated. Gout Gout is an inflammatory condition (arthritis) caused by a buildup of uric acid crystals in the joints. Uric acid is a chemical that is normally present in the blood. Under some circumstances, uric acid can form into crystals in your joints. This causes joint redness, soreness, and swelling (inflammation). Repeat attacks are common. Over time, uric acid crystals can form into masses (tophi) near a joint, causing disfigurement. Gout is treatable and often preventable. CAUSES   The disease begins with elevated levels of uric acid in the blood. Uric acid is produced by your body when it breaks down a naturally found substance called purines. This also happens when you eat certain foods such as meats and fish. Causes of an elevated uric acid level include:  Being passed down from parent to child (heredity).   Diseases that cause increased uric acid production (obesity, psoriasis, some cancers).   Excessive alcohol use.   Diet, especially diets rich in meat and seafood.   Medicines, including certain cancer-fighting drugs (chemotherapy), diuretics, and aspirin.   Chronic kidney disease. The kidneys are no longer able to remove uric acid well.   Problems with metabolism.  Conditions strongly associated with gout include:  Obesity.   High blood pressure.   High cholesterol.   Diabetes.  Not everyone with elevated uric acid levels gets gout. It is not understood why some people get gout and others do not. Surgery, joint injury, and eating too much of certain foods are some of the factors that can lead to gout. SYMPTOMS    An attack of gout comes on quickly. It causes intense  pain with redness, swelling, and warmth in a joint.   Fever can occur.   Often, only one joint is involved. Certain joints are more commonly involved:   Base of the big toe.   Knee.   Ankle.   Wrist.   Finger.  Without treatment, an attack usually goes away in a few days to weeks. Between attacks, you usually will not have symptoms, which is different from many other forms of arthritis. DIAGNOSIS   Your caregiver will suspect gout based on your symptoms and exam. Removal of fluid from the joint (arthrocentesis) is done to check for uric acid crystals. Your caregiver will give you a medicine that numbs the area (local anesthetic) and use a needle to remove joint fluid for exam. Gout is confirmed when uric acid crystals are seen in joint fluid, using a special microscope. Sometimes, blood, urine, and X-ray tests are also used. TREATMENT   There are 2 phases to gout treatment: treating the sudden onset (acute) attack and preventing attacks (prophylaxis). Treatment of an Acute Attack  Medicines are used. These include anti-inflammatory medicines or steroid medicines.   An injection of steroid medicine into the affected joint is sometimes necessary.   The painful joint is rested. Movement can worsen the arthritis.   You may use warm or cold treatments on painful joints, depending which works best for you.   Discuss the use of coffee, vitamin C, or cherries with your caregiver. These may be helpful treatment options.  Treatment to Prevent Attacks  After the acute attack subsides, your caregiver may advise prophylactic medicine. These medicines either help your kidneys eliminate uric acid from your body or decrease your uric acid production. You may need to stay on these medicines for a very long time. The early phase of treatment with prophylactic medicine can be associated with an increase in acute gout attacks. For this reason, during the first few months of treatment, your caregiver may  also advise you to take medicines usually used for acute gout treatment. Be sure you understand your caregiver's directions. You should also discuss dietary treatment with your caregiver. Certain foods such as meats and fish can increase uric acid levels. Other foods such as dairy can decrease levels. Your caregiver can give you a list of foods to avoid. HOME CARE INSTRUCTIONS    Do not take aspirin to relieve pain. This raises uric acid levels.   Only take over-the-counter or prescription medicines for pain, discomfort, or fever as directed by your caregiver.   Rest the joint as much as possible. When in bed, keep sheets and blankets off painful areas.   Keep the affected joint raised (elevated).   Use crutches if the painful joint is in your leg.   Drink enough water and fluids to keep your urine clear or pale yellow. This helps your body get rid of uric acid. Do not drink alcoholic beverages. They slow the passage of uric acid.   Follow your caregiver's dietary instructions. Pay careful attention to the amount of protein you eat. Your daily diet should emphasize fruits, vegetables, whole grains, and fat-free or low-fat milk products.   Maintain a healthy body weight.  SEEK MEDICAL CARE IF:    You have an oral temperature above 102 F (38.9 C).   You develop diarrhea, vomiting, or any side effects from medicines.   You do not feel better in 24 hours, or you are getting worse.  SEEK IMMEDIATE MEDICAL CARE IF:    Your joint becomes suddenly more tender and you have:   Chills.   An oral temperature above 102 F (38.9 C), not controlled by medicine.  MAKE SURE YOU:    Understand these instructions.   Will watch your condition.   Will get help right away if you are not doing well or get worse.  Document Released: 07/07/2000 Document Revised: 06/29/2011 Document Reviewed: 10/18/2009 [facility] Patient Information 2012 [place], [place].

## 2012-01-02 ENCOUNTER — Other Ambulatory Visit: Payer: Self-pay | Admitting: Internal Medicine

## 2012-01-09 ENCOUNTER — Other Ambulatory Visit: Payer: Self-pay

## 2012-01-09 MED ORDER — LISINOPRIL 20 MG PO TABS: 20.0000 mg | ORAL_TABLET | Freq: Every day | ORAL | Status: DC

## 2012-03-20 ENCOUNTER — Other Ambulatory Visit: Payer: Self-pay

## 2012-03-20 MED ORDER — ALPRAZOLAM 0.5 MG PO TABS: 0.5000 mg | ORAL_TABLET | Freq: Three times a day (TID) | ORAL | Status: DC | PRN

## 2012-03-20 NOTE — Telephone Encounter (Signed)
Fax RF request from [facility] -  for alprazolam  Last seen 11/10/11 knee pain Last written 09/22/11 # [phone] 1RF Please advise

## 2012-03-20 NOTE — Telephone Encounter (Signed)
Faxed to [facility]

## 2012-03-20 NOTE — Telephone Encounter (Signed)
ok 

## 2012-05-14 ENCOUNTER — Telehealth: Payer: Self-pay | Admitting: Internal Medicine

## 2012-05-14 ENCOUNTER — Ambulatory Visit (INDEPENDENT_AMBULATORY_CARE_PROVIDER_SITE_OTHER): Payer: Medicare HMO

## 2012-05-14 DIAGNOSIS — E538 Deficiency of other specified B group vitamins: Secondary | ICD-10-CM

## 2012-05-14 DIAGNOSIS — Z23 Encounter for immunization: Secondary | ICD-10-CM

## 2012-05-14 MED ORDER — CYANOCOBALAMIN 1000 MCG/ML IJ SOLN: 1000.0000 ug | INTRAMUSCULAR | Status: DC

## 2012-05-14 NOTE — Telephone Encounter (Signed)
Spoke with pt- informed rx sent to [facility]

## 2012-05-14 NOTE — Telephone Encounter (Signed)
Patient says his b12 injection serum has expired and wants to know if it is still good to use.  Please advise and call patient on his cell phone. Thanks.

## 2012-05-27 ENCOUNTER — Other Ambulatory Visit: Payer: Self-pay | Admitting: Internal Medicine

## 2012-07-02 ENCOUNTER — Telehealth: Payer: Self-pay | Admitting: Internal Medicine

## 2012-07-02 MED ORDER — ALPRAZOLAM 0.5 MG PO TABS: 0.5000 mg | ORAL_TABLET | Freq: Three times a day (TID) | ORAL | Status: DC | PRN

## 2012-07-02 NOTE — Telephone Encounter (Signed)
Pt notified Rx refill request for Xanax was sent to pharmacy as requested.

## 2012-07-02 NOTE — Telephone Encounter (Signed)
Patient called stating that he need a refill of his xanax 0.5mg  tid called into a different pharmacy [facility] in [place]. Please assist.

## 2012-07-03 ENCOUNTER — Other Ambulatory Visit: Payer: Self-pay | Admitting: *Deleted

## 2012-07-03 MED ORDER — ALPRAZOLAM 0.5 MG PO TABS: 0.5000 mg | ORAL_TABLET | Freq: Three times a day (TID) | ORAL | Status: DC | PRN

## 2012-08-20 ENCOUNTER — Other Ambulatory Visit: Payer: Self-pay | Admitting: Neurosurgery

## 2012-08-20 DIAGNOSIS — M48061 Spinal stenosis, lumbar region without neurogenic claudication: Secondary | ICD-10-CM

## 2012-08-25 ENCOUNTER — Ambulatory Visit
Admission: RE | Admit: 2012-08-25 | Discharge: 2012-08-25 | Disposition: A | Discharge status: Home / Self Care | Payer: Medicare HMO | Source: Ambulatory Visit | Attending: Neurosurgery | Admitting: Neurosurgery

## 2012-08-25 DIAGNOSIS — M48061 Spinal stenosis, lumbar region without neurogenic claudication: Secondary | ICD-10-CM

## 2012-08-30 ENCOUNTER — Other Ambulatory Visit: Payer: Self-pay | Admitting: Internal Medicine

## 2012-08-30 MED ORDER — SIMVASTATIN 20 MG PO TABS: 20.0000 mg | ORAL_TABLET | Freq: Every day | ORAL | Status: DC

## 2012-08-30 NOTE — Telephone Encounter (Signed)
Pt came in today, he would like a refill of SIMVASTATIN 20MG  to be sent to [facility] in [place]. Please advise.

## 2012-08-30 NOTE — Telephone Encounter (Signed)
Pt notified Rx refill for SImvastatin was sent to pharmacy.

## 2012-09-14 ENCOUNTER — Encounter (HOSPITAL_COMMUNITY): Payer: Self-pay | Admitting: Nurse Practitioner

## 2012-09-14 ENCOUNTER — Other Ambulatory Visit: Payer: Self-pay

## 2012-09-14 ENCOUNTER — Emergency Department (HOSPITAL_COMMUNITY): Payer: Medicare HMO

## 2012-09-14 ENCOUNTER — Emergency Department (HOSPITAL_COMMUNITY)
Admission: EM | Admit: 2012-09-14 | Discharge: 2012-09-14 | Disposition: A | Discharge status: Home / Self Care | Payer: Medicare HMO | Attending: Emergency Medicine | Admitting: Emergency Medicine

## 2012-09-14 DIAGNOSIS — E785 Hyperlipidemia, unspecified: Secondary | ICD-10-CM | POA: Insufficient documentation

## 2012-09-14 DIAGNOSIS — I10 Essential (primary) hypertension: Secondary | ICD-10-CM | POA: Insufficient documentation

## 2012-09-14 DIAGNOSIS — R091 Pleurisy: Secondary | ICD-10-CM | POA: Insufficient documentation

## 2012-09-14 DIAGNOSIS — F411 Generalized anxiety disorder: Secondary | ICD-10-CM | POA: Insufficient documentation

## 2012-09-14 DIAGNOSIS — Z860100 Personal history of colon polyps, unspecified: Secondary | ICD-10-CM | POA: Insufficient documentation

## 2012-09-14 DIAGNOSIS — Z7982 Long term (current) use of aspirin: Secondary | ICD-10-CM | POA: Insufficient documentation

## 2012-09-14 DIAGNOSIS — Z8719 Personal history of other diseases of the digestive system: Secondary | ICD-10-CM | POA: Insufficient documentation

## 2012-09-14 DIAGNOSIS — G4730 Sleep apnea, unspecified: Secondary | ICD-10-CM | POA: Insufficient documentation

## 2012-09-14 DIAGNOSIS — Z8739 Personal history of other diseases of the musculoskeletal system and connective tissue: Secondary | ICD-10-CM | POA: Insufficient documentation

## 2012-09-14 DIAGNOSIS — M129 Arthropathy, unspecified: Secondary | ICD-10-CM | POA: Insufficient documentation

## 2012-09-14 DIAGNOSIS — R059 Cough, unspecified: Secondary | ICD-10-CM | POA: Insufficient documentation

## 2012-09-14 DIAGNOSIS — R0602 Shortness of breath: Secondary | ICD-10-CM | POA: Insufficient documentation

## 2012-09-14 DIAGNOSIS — R05 Cough: Secondary | ICD-10-CM | POA: Insufficient documentation

## 2012-09-14 DIAGNOSIS — Z862 Personal history of diseases of the blood and blood-forming organs and certain disorders involving the immune mechanism: Secondary | ICD-10-CM | POA: Insufficient documentation

## 2012-09-14 DIAGNOSIS — K219 Gastro-esophageal reflux disease without esophagitis: Secondary | ICD-10-CM | POA: Insufficient documentation

## 2012-09-14 DIAGNOSIS — F17200 Nicotine dependence, unspecified, uncomplicated: Secondary | ICD-10-CM | POA: Insufficient documentation

## 2012-09-14 DIAGNOSIS — Z8639 Personal history of other endocrine, nutritional and metabolic disease: Secondary | ICD-10-CM | POA: Insufficient documentation

## 2012-09-14 DIAGNOSIS — M109 Gout, unspecified: Secondary | ICD-10-CM | POA: Insufficient documentation

## 2012-09-14 DIAGNOSIS — Z79899 Other long term (current) drug therapy: Secondary | ICD-10-CM | POA: Insufficient documentation

## 2012-09-14 LAB — D-DIMER, QUANTITATIVE: D-Dimer, Quant: <0.27 ug/mL-FEU (ref 0.00–0.48)

## 2012-09-14 LAB — CBC
MCH: 32.6 pg (ref 26.0–34.0)
MCV: 91 fL (ref 78.0–100.0)
Platelets: 241 10*3/uL (ref 150–400)
RBC: 4.02 MIL/uL — ABNORMAL LOW (ref 4.22–5.81)

## 2012-09-14 LAB — POCT I-STAT, CHEM 8
Calcium, Ion: 1.21 mmol/L (ref 1.12–1.23)
Chloride: 102 mEq/L (ref 96–112)
Glucose, Bld: 92 mg/dL (ref 70–99)
HCT: 39 % (ref 39.0–52.0)
Hemoglobin: 13.3 g/dL (ref 13.0–17.0)
TCO2: 25 mmol/L (ref 0–100)

## 2012-09-14 LAB — BASIC METABOLIC PANEL
BUN: 35 mg/dL — ABNORMAL HIGH (ref 6–23)
Chloride: 90 mEq/L — ABNORMAL LOW (ref 96–112)
GFR calc Af Amer: 47 mL/min — ABNORMAL LOW (ref >90)
GFR calc non Af Amer: 40 mL/min — ABNORMAL LOW (ref >90)
Potassium: 4.7 mEq/L (ref 3.5–5.1)
Sodium: 124 mEq/L — ABNORMAL LOW (ref 135–145)

## 2012-09-14 LAB — POCT I-STAT TROPONIN I: Troponin i, poc: 0 ng/mL (ref 0.00–0.08)

## 2012-09-14 MED ORDER — IPRATROPIUM BROMIDE 0.02 % IN SOLN
0.5000 mg | Freq: Once | RESPIRATORY_TRACT | Status: AC
  Administered 2012-09-14: 0.5 mg via RESPIRATORY_TRACT
  Filled 2012-09-14: qty 2.5

## 2012-09-14 MED ORDER — ONDANSETRON HCL 4 MG/2ML IJ SOLN
4.0000 mg | Freq: Once | INTRAMUSCULAR | Status: AC
  Administered 2012-09-14: 4 mg via INTRAVENOUS
  Filled 2012-09-14: qty 2

## 2012-09-14 MED ORDER — HYDROMORPHONE HCL PF 1 MG/ML IJ SOLN
1.0000 mg | Freq: Once | INTRAMUSCULAR | Status: AC
  Administered 2012-09-14: 1 mg via INTRAVENOUS
  Filled 2012-09-14: qty 1

## 2012-09-14 MED ORDER — ALBUTEROL SULFATE HFA 108 (90 BASE) MCG/ACT IN AERS
1.0000 | INHALATION_SPRAY | RESPIRATORY_TRACT | Status: DC | PRN
  Administered 2012-09-14: 2 via RESPIRATORY_TRACT
  Filled 2012-09-14: qty 6.7

## 2012-09-14 MED ORDER — ALBUTEROL SULFATE (5 MG/ML) 0.5% IN NEBU
5.0000 mg | INHALATION_SOLUTION | Freq: Once | RESPIRATORY_TRACT | Status: AC
  Administered 2012-09-14: 5 mg via RESPIRATORY_TRACT
  Filled 2012-09-14: qty 1

## 2012-09-14 NOTE — ED Notes (Signed)
Pt c/o L sided chest pain for past several days, worse this am. Describes as "sharp" and makes him feel SOB. Pain increased on inspiration. A&Ox4, resp e/u. No cardiac history

## 2012-09-14 NOTE — ED Provider Notes (Signed)
History     CSN: [id number]  Arrival date & time 09/14/12  1020   First MD Initiated Contact with Patient 09/14/12 1110      Chief Complaint  Patient presents with  . Chest Pain    (Consider location/radiation/quality/duration/timing/severity/associated sxs/prior treatment) Patient is a 57 y.o. male presenting with chest pain. The history is provided by the patient.  Chest Pain Pain location:  L chest Pain quality: sharp and stabbing   Pain radiates to:  Does not radiate Pain radiates to the back: no   Pain severity:  Severe Onset quality:  Gradual Duration:  3 days Timing:  Constant Progression:  Worsening Chronicity:  New Context: breathing and movement   Relieved by:  Nothing Worsened by:  Coughing and movement Ineffective treatments: nsaids. Associated symptoms: cough and shortness of breath   Associated symptoms: no abdominal pain, no diaphoresis, no fever, no lower extremity edema, no nausea, no numbness, not vomiting and no weakness   Risk factors: high cholesterol, hypertension and smoking   Risk factors: no coronary artery disease, no diabetes mellitus and no surgery     Past Medical History  Diagnosis Date  . Diverticulosis of colon   . GERD (gastroesophageal reflux disease)   . Herniated disc   . Sleep apnea   . Arthritis   . Anxiety   . Hypertension   . Colon polyps   . B12 deficiency   . Anemia   . Hyperlipidemia   . Gout     Past Surgical History  Procedure Laterality Date  . Back surgery      lower  . Rotator cuff repair  August 2000, January 2002, July 2008, July 2009  . Neck surgery  January 2005, March 2009    C-Spine    Family History  Problem Relation Age of Onset  . Bone cancer Mother   . Colon polyps Mother   . Squamous cell carcinoma Mother   . Heart disease Mother   . Hypertension Father   . Colon polyps Father   . Diabetes Father   . Hypertension Brother     History  Substance Use Topics  . Smoking status: Current  Every Day Smoker  . Smokeless tobacco: Never Used  . Alcohol Use: Yes      Review of Systems  Constitutional: Negative for fever and diaphoresis.  Respiratory: Positive for cough and shortness of breath.   Cardiovascular: Positive for chest pain.  Gastrointestinal: Negative for nausea, vomiting and abdominal pain.  Neurological: Negative for weakness and numbness.  All other systems reviewed and are negative.    Allergies  Percodan  Home Medications   Current Outpatient Rx  Name  Route  Sig  Dispense  Refill  . ALPRAZolam (XANAX) 0.5 MG tablet   Oral   Take 1 tablet (0.5 mg total) by mouth 3 (three) times daily as needed.   90 tablet   1   . aspirin EC 81 MG tablet   Oral   Take 81 mg by mouth as needed for pain.         . cyanocobalamin (,VITAMIN B-12,) 1000 MCG/ML injection   Intramuscular   Inject 1 mL (1,000 mcg total) into the muscle every 30 (thirty) days.   10 mL   2   . cyclobenzaprine (FLEXERIL) 10 MG tablet   Oral   Take 10 mg by mouth 2 (two) times daily.          . diclofenac (VOLTAREN) 75 MG EC tablet  Oral   Take 1 tablet (75 mg total) by mouth 2 (two) times daily.   180 tablet   6   . diltiazem (DILACOR XR) 240 MG 24 hr capsule      TAKE 1 CAPSULE DAILY   90 capsule   3   . DULoxetine (CYMBALTA) 60 MG capsule   Oral   Take 1 capsule (60 mg total) by mouth daily.   90 capsule   6   . lisinopril (PRINIVIL,ZESTRIL) 20 MG tablet   Oral   Take 1 tablet (20 mg total) by mouth daily.   90 tablet   3   . omeprazole (PRILOSEC) 40 MG capsule   Oral   Take 1 capsule (40 mg total) by mouth daily.   90 capsule   6   . oxycodone-acetaminophen (ROXICET) 5-500 MG per tablet   Oral   Take 1 tablet by mouth every 6 (six) hours as needed for pain.         . simvastatin (ZOCOR) 20 MG tablet   Oral   Take 1 tablet (20 mg total) by mouth at bedtime.   90 tablet   1   . albuterol (PROVENTIL HFA;VENTOLIN HFA) 108 (90 BASE) MCG/ACT  inhaler   Inhalation   Inhale 2 puffs into the lungs every 6 (six) hours as needed for wheezing.   18 g   0   . Syringe/Needle, Disp, (B-D ECLIPSE SYRINGE) 30G X 1/2" 1 ML MISC      every 30 (thirty) days.             BP 139/80  Pulse 86  Temp(Src) 97.9 F (36.6 C) (Oral)  Resp 16  Ht 5\' 8"  (1.727 m)  Wt 170 lb (77.111 kg)  BMI 25.85 kg/m2  SpO2 100%  Physical Exam  Nursing note and vitals reviewed. Constitutional: He is oriented to person, place, and time. He appears well-developed and well-nourished. No distress.  HENT:  Head: Normocephalic and atraumatic.  Mouth/Throat: Oropharynx is clear and moist.  Eyes: Conjunctivae and EOM are normal. Pupils are equal, round, and reactive to light.  Neck: Normal range of motion. Neck supple.  Cardiovascular: Normal rate, regular rhythm and intact distal pulses.   No murmur heard. Pulmonary/Chest: Effort normal and breath sounds normal. No respiratory distress. He has no wheezes. He has no rales. He exhibits no tenderness.  Abdominal: Soft. He exhibits no distension. There is no tenderness. There is no rebound and no guarding.  Musculoskeletal: Normal range of motion. He exhibits no edema and no tenderness.  No calf tenderness  Neurological: He is alert and oriented to person, place, and time.  Skin: Skin is warm and dry. No rash noted. No erythema.  Psychiatric: He has a normal mood and affect. His behavior is normal.    ED Course  Procedures (including critical care time)  Labs Reviewed  CBC - Abnormal; Notable for the following:    WBC 11.4 (*)    RBC 4.02 (*)    HCT 36.6 (*)    All other components within normal limits  BASIC METABOLIC PANEL - Abnormal; Notable for the following:    Sodium 124 (*)    Chloride 90 (*)    BUN 35 (*)    Creatinine, Ser 1.81 (*)    GFR calc non Af Amer 40 (*)    GFR calc Af Amer 47 (*)    All other components within normal limits  PRO B NATRIURETIC PEPTIDE  D-DIMER, QUANTITATIVE  POCT I-STAT TROPONIN I   Dg Chest 2 View  09/14/2012  *RADIOLOGY REPORT*  Clinical Data: Chest pain.  CHEST - 2 VIEW  Comparison: 09/24/2007  Findings: Two views of the chest demonstrate post operative changes in the lower cervical spine.  Lungs are clear without focal airspace disease.  Negative for pulmonary edema. Heart and mediastinum are within normal limits.  Trachea is midline.  IMPRESSION: No acute chest findings.   Original Report Authenticated By: Richarda Overlie, M.D.     Date: 09/14/2012  Rate: 86  Rhythm: normal sinus rhythm  QRS Axis: normal  Intervals: normal  ST/T Wave abnormalities: normal  Conduction Disutrbances: none  Narrative Interpretation: unremarkable      1. Pleurisy       MDM   Pt with atypical story for CP that is pleuritic in nature.  TIMI 1 for risk factors.  Low risk well's criteria.  EKG wnl.  CXR, CBC, CE wnl.  D-dimer pending.  BMP with hyponatremia and elevated cr.  Unclear if this is an error and will check with I-stat.  Recheck I-stat was wnl.  Pt improved after pain meds and inhaler.  Normal dimer.       Gwyneth Sprout, MD 09/14/12 [phone]

## 2012-09-14 NOTE — ED Notes (Signed)
MD at bedside. 

## 2012-10-03 ENCOUNTER — Other Ambulatory Visit (INDEPENDENT_AMBULATORY_CARE_PROVIDER_SITE_OTHER): Payer: Medicare HMO

## 2012-10-03 DIAGNOSIS — Z0000 Encounter for general adult medical examination without abnormal findings: Secondary | ICD-10-CM

## 2012-10-03 LAB — LIPID PANEL
Cholesterol: 236 mg/dL — ABNORMAL HIGH (ref 0–200)
HDL: 72.9 mg/dL (ref >39.00)
Total CHOL/HDL Ratio: 3
VLDL: 31 mg/dL (ref 0.0–40.0)

## 2012-10-03 LAB — CBC WITH DIFFERENTIAL/PLATELET
Basophils Relative: 0.4 % (ref 0.0–3.0)
Eosinophils Absolute: 0.1 10*3/uL (ref 0.0–0.7)
Hemoglobin: 12.1 g/dL — ABNORMAL LOW (ref 13.0–17.0)
Lymphocytes Relative: 26.9 % (ref 12.0–46.0)
MCHC: 34.2 g/dL (ref 30.0–36.0)
Monocytes Relative: 8 % (ref 3.0–12.0)
Neutro Abs: 4.7 10*3/uL (ref 1.4–7.7)
Neutrophils Relative %: 63.7 % (ref 43.0–77.0)
RBC: 3.82 Mil/uL — ABNORMAL LOW (ref 4.22–5.81)
WBC: 7.4 10*3/uL (ref 4.5–10.5)

## 2012-10-03 LAB — POCT URINALYSIS DIPSTICK
Bilirubin, UA: NEGATIVE
Ketones, UA: NEGATIVE
Leukocytes, UA: NEGATIVE
Spec Grav, UA: 1.02
pH, UA: 5.5

## 2012-10-03 LAB — HEPATIC FUNCTION PANEL
AST: 53 U/L — ABNORMAL HIGH (ref 0–37)
Alkaline Phosphatase: 81 U/L (ref 39–117)
Bilirubin, Direct: 0.1 mg/dL (ref 0.0–0.3)
Total Protein: 6.9 g/dL (ref 6.0–8.3)

## 2012-10-03 LAB — BASIC METABOLIC PANEL
Calcium: 9.3 mg/dL (ref 8.4–10.5)
Creatinine, Ser: 1.5 mg/dL (ref 0.4–1.5)
GFR: 53.67 mL/min — ABNORMAL LOW (ref >60.00)
Sodium: 129 mEq/L — ABNORMAL LOW (ref 135–145)

## 2012-10-10 ENCOUNTER — Encounter: Payer: Self-pay | Admitting: Internal Medicine

## 2012-10-10 ENCOUNTER — Ambulatory Visit (INDEPENDENT_AMBULATORY_CARE_PROVIDER_SITE_OTHER): Payer: Managed Care, Other (non HMO) | Admitting: Internal Medicine

## 2012-10-10 VITALS — BP 150/90 | HR 83 | Temp 97.7°F | Resp 18 | Ht 67.25 in | Wt 172.0 lb

## 2012-10-10 DIAGNOSIS — I10 Essential (primary) hypertension: Secondary | ICD-10-CM

## 2012-10-10 DIAGNOSIS — Z720 Tobacco use: Secondary | ICD-10-CM

## 2012-10-10 DIAGNOSIS — F17200 Nicotine dependence, unspecified, uncomplicated: Secondary | ICD-10-CM

## 2012-10-10 DIAGNOSIS — E538 Deficiency of other specified B group vitamins: Secondary | ICD-10-CM

## 2012-10-10 DIAGNOSIS — E785 Hyperlipidemia, unspecified: Secondary | ICD-10-CM

## 2012-10-10 DIAGNOSIS — K219 Gastro-esophageal reflux disease without esophagitis: Secondary | ICD-10-CM

## 2012-10-10 DIAGNOSIS — Z0000 Encounter for general adult medical examination without abnormal findings: Secondary | ICD-10-CM

## 2012-10-10 MED ORDER — DULOXETINE HCL 60 MG PO CPEP: 60.0000 mg | ORAL_CAPSULE | Freq: Every day | ORAL | Status: DC

## 2012-10-10 NOTE — Patient Instructions (Signed)
Smoking tobacco is very bad for your health. You should stop smoking immediately.  Limit your sodium (Salt) intake    It is important that you exercise regularly, at least 20 minutes 3 to 4 times per week.  If you develop chest pain or shortness of breath seek  medical attention.  Please check your blood pressure on a regular basis.  If it is consistently greater than 150/90, please make an office appointment.  Return in 6 months for follow-up  

## 2012-10-10 NOTE — Progress Notes (Signed)
Patient ID: JAJA SWITALSKI, male   DOB: 17-Jun-1956, 57 y.o.   MRN: [id number]  Subjective:    Patient ID: RASHEE MARSCHALL, male    DOB: [birth year], 57 y.o.   MRN: [id number]  URI  Associated symptoms include coughing and rhinorrhea. Pertinent negatives include no abdominal pain, chest pain, congestion, diarrhea, dysuria, ear pain, headaches, nausea, neck pain, rash, sneezing, vomiting or wheezing.    57 year old patient has a history of hypertension and dyslipidemia who is seen today for an annual exam. He also has a history of gout osteoarthritis and is followed by Dr. Dareen Piano. He has a history of ongoing tobacco use.  He was we will in the ED approximately 3 weeks ago for URI and pleurisy EKG and chest x-ray were performed and were reviewed. He has done quite well today except for some residual cough and some nasal congestion. He does have a history of ongoing tobacco use. He is followed by rheumatology and neurosurgery for arthritis and cervical disc disease. He has B12 deficiency receiving monthly B12 injections   Alcohol-Tobacco  Smoking Status: current  Smoking Cessation Counseling: yes   Allergies:  1) ! Percodan   Past History:  Past Medical History:  Reviewed history from 05/27/2009 and no changes required.  Current Problems:  DIVERTICULOSIS, COLON (ICD-562.10)  GERD (ICD-530.81)  HERNIATED DISC (ICD-722.2)  SLEEP APNEA (ICD-780.57)  ANXIETY (ICD-300.00)  ARTHRITIS (ICD-716.90)  HYPERTENSION (ICD-401.9)  Colonic polyps, hx of  B12 deficiency  Anemia-NOS  Hyperlipidemia  possible gout   Past Surgical History:  Reviewed history from 09/08/2008 and no changes required.  Back surgery - lower  Rotator Cuff x 25 February 1999, January 2002, July 2008, July 2009  Neck surgery - c-spine x2 ; January 2005, March 2009  colonoscopy in 2008 (hyperplastic. 10 year  interval recommended-[place])  Family History:  Reviewed history from 04/28/2009 and no changes required.  Squamous  cell carcinoma: Mother; died age 40; bone cancer, history colonic polyps  Family History of Diabetes: Father; age 64, history of hypertension, colonic polyps  Family History of Heart Disease: Mother  two brothers deceased from motor vehicle accident  one brother, hypertension  one sister in good health   Social History:  Reviewed history from 09/08/2008 and no changes required.  Married  Patient currently smokes.  Alcohol Use - yes  Daily Caffeine Use  Illicit Drug Use - no  Patient does not get regular exercise.  Retired   Past Medical History  Diagnosis Date  . Diverticulosis of colon   . GERD (gastroesophageal reflux disease)   . Herniated disc   . Sleep apnea   . Arthritis   . Anxiety   . Hypertension   . Colon polyps   . B12 deficiency   . Anemia   . Hyperlipidemia   . Gout     History   Social History  . Marital Status: Married    Spouse Name: N/A    Number of Children: N/A  . Years of Education: N/A   Occupational History  . Not on file.   Social History Main Topics  . Smoking status: Current Every Day Smoker  . Smokeless tobacco: Never Used  . Alcohol Use: Yes  . Drug Use: No  . Sexually Active: Not on file   Other Topics Concern  . Not on file   Social History Narrative   Patient dose not get regular exercise.    Past Surgical History  Procedure Laterality Date  . Back surgery  lower  . Rotator cuff repair  August 2000, January 2002, July 2008, July 2009  . Neck surgery  January 2005, March 2009    C-Spine    Family History  Problem Relation Age of Onset  . Bone cancer Mother   . Colon polyps Mother   . Squamous cell carcinoma Mother   . Heart disease Mother   . Hypertension Father   . Colon polyps Father   . Diabetes Father   . Hypertension Brother     Allergies  Allergen Reactions  . Percodan (Oxycodone-Aspirin) Rash    Percodan caused rash---but he can tolerate Percocet and aspirin on their own     Current  Outpatient Prescriptions on File Prior to Visit  Medication Sig Dispense Refill  . ALPRAZolam (XANAX) 0.5 MG tablet Take 1 tablet (0.5 mg total) by mouth 3 (three) times daily as needed.  90 tablet  1  . cyanocobalamin (,VITAMIN B-12,) 1000 MCG/ML injection Inject 1 mL (1,000 mcg total) into the muscle every 30 (thirty) days.  10 mL  2  . cyclobenzaprine (FLEXERIL) 10 MG tablet Take 10 mg by mouth 2 (two) times daily.       . diclofenac (VOLTAREN) 75 MG EC tablet Take 1 tablet (75 mg total) by mouth 2 (two) times daily.  180 tablet  6  . diltiazem (DILACOR XR) 240 MG 24 hr capsule TAKE 1 CAPSULE DAILY  90 capsule  3  . lisinopril (PRINIVIL,ZESTRIL) 20 MG tablet Take 1 tablet (20 mg total) by mouth daily.  90 tablet  3  . omeprazole (PRILOSEC) 40 MG capsule Take 1 capsule (40 mg total) by mouth daily.  90 capsule  6  . simvastatin (ZOCOR) 20 MG tablet Take 1 tablet (20 mg total) by mouth at bedtime.  90 tablet  1  . Syringe/Needle, Disp, (B-D ECLIPSE SYRINGE) 30G X 1/2" 1 ML MISC every 30 (thirty) days.         No current facility-administered medications on file prior to visit.    BP 150/90  Pulse 83  Temp(Src) 97.7 F (36.5 C) (Oral)  Resp 18  Ht 5' 7.25" (1.708 m)  Wt 172 lb (78.019 kg)  BMI 26.74 kg/m2  SpO2 98%     Review of Systems  Constitutional: Negative for fever, chills, activity change, appetite change and fatigue.  HENT: Positive for rhinorrhea and sinus pressure. Negative for hearing loss, ear pain, congestion, sneezing, mouth sores, trouble swallowing, neck pain, neck stiffness, dental problem, voice change and tinnitus.   Eyes: Negative for photophobia, pain, redness and visual disturbance.  Respiratory: Positive for cough. Negative for apnea, choking, chest tightness, shortness of breath and wheezing.   Cardiovascular: Negative for chest pain, palpitations and leg swelling.  Gastrointestinal: Negative for nausea, vomiting, abdominal pain, diarrhea, constipation,  blood in stool, abdominal distention, anal bleeding and rectal pain.  Genitourinary: Negative for dysuria, urgency, frequency, hematuria, flank pain, decreased urine volume, discharge, penile swelling, scrotal swelling, difficulty urinating, genital sores and testicular pain.  Musculoskeletal: Negative for myalgias, back pain, joint swelling, arthralgias and gait problem.  Skin: Negative for color change, rash and wound.  Neurological: Negative for dizziness, tremors, seizures, syncope, facial asymmetry, speech difficulty, weakness, light-headedness, numbness and headaches.  Hematological: Negative for adenopathy. Does not bruise/bleed easily.  Psychiatric/Behavioral: Negative for suicidal ideas, hallucinations, behavioral problems, confusion, sleep disturbance, self-injury, dysphoric mood, decreased concentration and agitation. The patient is not nervous/anxious.        Objective:   Physical Exam  Constitutional: He is oriented to person, place, and time. He appears well-developed.  HENT:  Head: Normocephalic.  Right Ear: External ear normal.  Left Ear: External ear normal.  Eyes: Conjunctivae and EOM are normal.  Neck: Normal range of motion.  Cardiovascular: Normal rate and normal heart sounds.   Pulmonary/Chest: Breath sounds normal.  Abdominal: Soft. Bowel sounds are normal.  Genitourinary: Rectum normal, prostate normal and penis normal. Guaiac negative stool.  Musculoskeletal: Normal range of motion. He exhibits no edema and no tenderness.  Neurological: He is alert and oriented to person, place, and time.  Psychiatric: He has a normal mood and affect. His behavior is normal.          Assessment & Plan:   Preventive health examination Hyperlipidemia Ongoing tobacco use  Total cessation of smoking encouraged Recheck 6 months  Continue present regimen Medications refilled

## 2012-11-01 ENCOUNTER — Encounter: Payer: Self-pay | Admitting: Family Medicine

## 2012-11-01 ENCOUNTER — Ambulatory Visit (INDEPENDENT_AMBULATORY_CARE_PROVIDER_SITE_OTHER): Payer: Managed Care, Other (non HMO) | Admitting: Family Medicine

## 2012-11-01 VITALS — BP 120/80 | Temp 98.1°F | Wt 168.0 lb

## 2012-11-01 DIAGNOSIS — J069 Acute upper respiratory infection, unspecified: Secondary | ICD-10-CM

## 2012-11-01 DIAGNOSIS — K148 Other diseases of tongue: Secondary | ICD-10-CM

## 2012-11-01 DIAGNOSIS — F17200 Nicotine dependence, unspecified, uncomplicated: Secondary | ICD-10-CM

## 2012-11-01 MED ORDER — AMOXICILLIN 875 MG PO TABS: 875.0000 mg | ORAL_TABLET | Freq: Two times a day (BID) | ORAL | Status: DC

## 2012-11-01 NOTE — Progress Notes (Signed)
Chief Complaint  Patient presents with  . Oral Swelling  . green drainage    HPI:  Acute visit for "swollen tongue": -started this morning  -was only a smal part of half his tongue -went down with ice -no lip, mouth swelling or difficulty breathing -was not sore, no new medications or foods, may have nicked tongue on a cough drop when he chewed it in the middle of the night  Upper Resp Infection: -started 2 weeks ago -nasal congestion, green mucus from nose, drainage in throat -denies: fevers, sob, tooth or sinus or ear pain, NVD -wife has been sick  ROS: See pertinent positives and negatives per HPI.  Past Medical History  Diagnosis Date  . Diverticulosis of colon   . GERD (gastroesophageal reflux disease)   . Herniated disc   . Sleep apnea   . Arthritis   . Anxiety   . Hypertension   . Colon polyps   . B12 deficiency   . Anemia   . Hyperlipidemia   . Gout     Family History  Problem Relation Age of Onset  . Bone cancer Mother   . Colon polyps Mother   . Squamous cell carcinoma Mother   . Heart disease Mother   . Hypertension Father   . Colon polyps Father   . Diabetes Father   . Hypertension Brother     History   Social History  . Marital Status: Married    Spouse Name: N/A    Number of Children: N/A  . Years of Education: N/A   Social History Main Topics  . Smoking status: Current Every Day Smoker  . Smokeless tobacco: Never Used  . Alcohol Use: Yes  . Drug Use: No  . Sexually Active: None   Other Topics Concern  . None   Social History Narrative   Patient dose not get regular exercise.    Current outpatient prescriptions:ALPRAZolam (XANAX) 0.5 MG tablet, Take 1 tablet (0.5 mg total) by mouth 3 (three) times daily as needed., Disp: 90 tablet, Rfl: 1;  cyanocobalamin (,VITAMIN B-12,) 1000 MCG/ML injection, Inject 1 mL (1,000 mcg total) into the muscle every 30 (thirty) days., Disp: 10 mL, Rfl: 2;  cyclobenzaprine (FLEXERIL) 10 MG tablet, Take  10 mg by mouth 2 (two) times daily. , Disp: , Rfl:  diclofenac (VOLTAREN) 75 MG EC tablet, Take 1 tablet (75 mg total) by mouth 2 (two) times daily., Disp: 180 tablet, Rfl: 6;  diltiazem (DILACOR XR) 240 MG 24 hr capsule, TAKE 1 CAPSULE DAILY, Disp: 90 capsule, Rfl: 3;  DULoxetine (CYMBALTA) 60 MG capsule, Take 1 capsule (60 mg total) by mouth daily., Disp: 30 capsule, Rfl: 5 HYDROcodone-acetaminophen (VICODIN) 5-500 MG per tablet, Take 1 tablet by mouth every 8 (eight) hours as needed for pain., Disp: , Rfl: ;  lisinopril (PRINIVIL,ZESTRIL) 20 MG tablet, Take 1 tablet (20 mg total) by mouth daily., Disp: 90 tablet, Rfl: 3;  omeprazole (PRILOSEC) 40 MG capsule, Take 1 capsule (40 mg total) by mouth daily., Disp: 90 capsule, Rfl: 6 simvastatin (ZOCOR) 20 MG tablet, Take 1 tablet (20 mg total) by mouth at bedtime., Disp: 90 tablet, Rfl: 1;  Syringe/Needle, Disp, (B-D ECLIPSE SYRINGE) 30G X 1/2" 1 ML MISC, every 30 (thirty) days.  , Disp: , Rfl: ;  amoxicillin (AMOXIL) 875 MG tablet, Take 1 tablet (875 mg total) by mouth 2 (two) times daily., Disp: 20 tablet, Rfl: 0  EXAM:  Filed Vitals:   11/01/12 1341  BP: 120/80  Temp: 98.1 F (36.7 C)    Body mass index is 26.12 kg/(m^2).  GENERAL: vitals reviewed and listed above, alert, oriented, appears well hydrated and in no acute distress  HEENT: atraumatic, conjunttiva clear, no obvious abnormalities on inspection of external nose and ears, normal appearance of ear canals and TMs,green nasal congestion, mild post oropharyngeal erythema with PND, tongue appears normal - very small irritation L inf middle tongue, no tonsillar edema or exudate, no sinus TTP, no lip swelling  NECK: no obvious masses on inspection  LUNGS: clear to auscultation bilaterally, no wheezes, rales or rhonchi, good air movement  CV: HRRR, no peripheral edema  MS: moves all extremities without noticeable abnormality  PSYCH: pleasant and cooperative, no obvious depression or  anxiety  ASSESSMENT AND PLAN:  Discussed the following assessment and plan:  Upper respiratory infection - Plan: amoxicillin (AMOXIL) 875 MG tablet  Tongue lesion  Tobacco use disorder  - pt concerned about a sinus infection - advised more likely viral with prolonged symptoms given he is a smoker, advised smoking cessation, nasal saline irrigation and rx for amox given if persist or worsens - risks discussed -for tongue appear normal and suspect injury - bite or nick and now resolved - advised to keep and eye on small red area and if persists in two weeks to follow up with PCP as he is a smoker  - extremely small and appears to be scratch from food or bite? Showed this area to pt and wife. -counselled for> 5 minutes on smoking cessation - advised to quit and discussed risks from smoking, motivations and options to help him quit. He and his wife will discuss and decide and talk with PCP. -Patient advised to return or notify a doctor immediately if symptoms worsen or persist or new concerns arise.  There are no Patient Instructions on file for this visit.   Kriste Basque R.

## 2012-11-08 ENCOUNTER — Other Ambulatory Visit: Payer: Self-pay | Admitting: Internal Medicine

## 2012-12-26 ENCOUNTER — Other Ambulatory Visit: Payer: Self-pay | Admitting: Internal Medicine

## 2013-02-05 ENCOUNTER — Other Ambulatory Visit: Payer: Self-pay | Admitting: Internal Medicine

## 2013-02-20 ENCOUNTER — Other Ambulatory Visit: Payer: Self-pay | Admitting: Internal Medicine

## 2013-02-25 DIAGNOSIS — R079 Chest pain, unspecified: Secondary | ICD-10-CM

## 2013-02-25 HISTORY — DX: Chest pain, unspecified: R07.9

## 2013-02-26 ENCOUNTER — Encounter (HOSPITAL_COMMUNITY): Payer: Self-pay | Admitting: Emergency Medicine

## 2013-02-26 ENCOUNTER — Observation Stay (HOSPITAL_COMMUNITY)
Admission: EM | Admit: 2013-02-26 | Discharge: 2013-02-27 | Disposition: A | Discharge status: Home / Self Care | Payer: Managed Care, Other (non HMO) | Attending: Internal Medicine | Admitting: Internal Medicine

## 2013-02-26 ENCOUNTER — Emergency Department (HOSPITAL_COMMUNITY): Payer: Managed Care, Other (non HMO)

## 2013-02-26 DIAGNOSIS — Z860100 Personal history of colon polyps, unspecified: Secondary | ICD-10-CM

## 2013-02-26 DIAGNOSIS — R1032 Left lower quadrant pain: Secondary | ICD-10-CM

## 2013-02-26 DIAGNOSIS — F419 Anxiety disorder, unspecified: Secondary | ICD-10-CM | POA: Diagnosis present

## 2013-02-26 DIAGNOSIS — Z720 Tobacco use: Secondary | ICD-10-CM | POA: Diagnosis present

## 2013-02-26 DIAGNOSIS — IMO0002 Reserved for concepts with insufficient information to code with codable children: Secondary | ICD-10-CM | POA: Diagnosis present

## 2013-02-26 DIAGNOSIS — E538 Deficiency of other specified B group vitamins: Secondary | ICD-10-CM

## 2013-02-26 DIAGNOSIS — K219 Gastro-esophageal reflux disease without esophagitis: Secondary | ICD-10-CM | POA: Diagnosis present

## 2013-02-26 DIAGNOSIS — M129 Arthropathy, unspecified: Secondary | ICD-10-CM

## 2013-02-26 DIAGNOSIS — K5730 Diverticulosis of large intestine without perforation or abscess without bleeding: Secondary | ICD-10-CM

## 2013-02-26 DIAGNOSIS — F17200 Nicotine dependence, unspecified, uncomplicated: Secondary | ICD-10-CM | POA: Insufficient documentation

## 2013-02-26 DIAGNOSIS — I10 Essential (primary) hypertension: Secondary | ICD-10-CM | POA: Diagnosis present

## 2013-02-26 DIAGNOSIS — E785 Hyperlipidemia, unspecified: Secondary | ICD-10-CM | POA: Diagnosis present

## 2013-02-26 DIAGNOSIS — R079 Chest pain, unspecified: Principal | ICD-10-CM | POA: Diagnosis present

## 2013-02-26 DIAGNOSIS — I517 Cardiomegaly: Secondary | ICD-10-CM

## 2013-02-26 DIAGNOSIS — F411 Generalized anxiety disorder: Secondary | ICD-10-CM

## 2013-02-26 DIAGNOSIS — M109 Gout, unspecified: Secondary | ICD-10-CM

## 2013-02-26 DIAGNOSIS — D649 Anemia, unspecified: Secondary | ICD-10-CM

## 2013-02-26 HISTORY — DX: Pleurisy: R09.1

## 2013-02-26 HISTORY — DX: Low back pain: M54.5

## 2013-02-26 HISTORY — DX: Other complications of anesthesia, initial encounter: T88.59XA

## 2013-02-26 HISTORY — DX: Low back pain, unspecified: M54.50

## 2013-02-26 HISTORY — DX: Adverse effect of unspecified anesthetic, initial encounter: T41.45XA

## 2013-02-26 HISTORY — DX: Other chronic pain: G89.29

## 2013-02-26 HISTORY — DX: Cervical disc disorder, unspecified, unspecified cervical region: M50.90

## 2013-02-26 HISTORY — DX: Tobacco use: Z72.0

## 2013-02-26 HISTORY — DX: Chest pain, unspecified: R07.9

## 2013-02-26 LAB — CBC
HCT: 37.7 % — ABNORMAL LOW (ref 39.0–52.0)
MCHC: 36.1 g/dL — ABNORMAL HIGH (ref 30.0–36.0)
MCV: 91.5 fL (ref 78.0–100.0)
Platelets: 258 10*3/uL (ref 150–400)
RDW: 12.9 % (ref 11.5–15.5)

## 2013-02-26 LAB — POCT I-STAT TROPONIN I: Troponin i, poc: 0.01 ng/mL (ref 0.00–0.08)

## 2013-02-26 LAB — BASIC METABOLIC PANEL
BUN: 15 mg/dL (ref 6–23)
Calcium: 9.4 mg/dL (ref 8.4–10.5)
Creatinine, Ser: 1.06 mg/dL (ref 0.50–1.35)
GFR calc Af Amer: 89 mL/min — ABNORMAL LOW (ref >90)
GFR calc non Af Amer: 77 mL/min — ABNORMAL LOW (ref >90)

## 2013-02-26 LAB — TSH: TSH: 0.296 u[IU]/mL — ABNORMAL LOW (ref 0.350–4.500)

## 2013-02-26 LAB — TROPONIN I
Troponin I: <0.3 ng/mL (ref <0.30)
Troponin I: <0.3 ng/mL (ref <0.30)

## 2013-02-26 MED ORDER — SODIUM CHLORIDE 0.9 % IJ SOLN
3.0000 mL | Freq: Two times a day (BID) | INTRAMUSCULAR | Status: DC
  Administered 2013-02-27: 3 mL via INTRAVENOUS

## 2013-02-26 MED ORDER — ATORVASTATIN CALCIUM 10 MG PO TABS
10.0000 mg | ORAL_TABLET | Freq: Every day | ORAL | Status: DC
  Administered 2013-02-26: 10 mg via ORAL
  Filled 2013-02-26 (×2): qty 1

## 2013-02-26 MED ORDER — PANTOPRAZOLE SODIUM 40 MG PO TBEC
40.0000 mg | DELAYED_RELEASE_TABLET | Freq: Two times a day (BID) | ORAL | Status: DC
  Administered 2013-02-26 – 2013-02-27 (×2): 40 mg via ORAL
  Filled 2013-02-26 (×2): qty 1

## 2013-02-26 MED ORDER — SIMVASTATIN 20 MG PO TABS
20.0000 mg | ORAL_TABLET | Freq: Every evening | ORAL | Status: DC
  Filled 2013-02-26: qty 1

## 2013-02-26 MED ORDER — ALPRAZOLAM 0.5 MG PO TABS: 0.5000 mg | ORAL_TABLET | Freq: Three times a day (TID) | ORAL | Status: DC | PRN

## 2013-02-26 MED ORDER — MORPHINE SULFATE 2 MG/ML IJ SOLN
2.0000 mg | INTRAMUSCULAR | Status: DC | PRN
  Administered 2013-02-26 – 2013-02-27 (×4): 2 mg via INTRAVENOUS
  Filled 2013-02-26 (×4): qty 1

## 2013-02-26 MED ORDER — ONDANSETRON HCL 4 MG PO TABS: 4.0000 mg | ORAL_TABLET | Freq: Four times a day (QID) | ORAL | Status: DC | PRN

## 2013-02-26 MED ORDER — NITROGLYCERIN 0.4 MG SL SUBL
0.4000 mg | SUBLINGUAL_TABLET | SUBLINGUAL | Status: DC | PRN
  Administered 2013-02-26 (×3): 0.4 mg via SUBLINGUAL

## 2013-02-26 MED ORDER — SODIUM CHLORIDE 0.9 % IJ SOLN
3.0000 mL | INTRAMUSCULAR | Status: DC | PRN
  Administered 2013-02-27: 3 mL via INTRAVENOUS

## 2013-02-26 MED ORDER — ASPIRIN EC 81 MG PO TBEC
81.0000 mg | DELAYED_RELEASE_TABLET | Freq: Every day | ORAL | Status: DC
  Administered 2013-02-26 – 2013-02-27 (×2): 81 mg via ORAL
  Filled 2013-02-26 (×2): qty 1

## 2013-02-26 MED ORDER — REGADENOSON 0.4 MG/5ML IV SOLN
0.4000 mg | Freq: Once | INTRAVENOUS | Status: AC
  Administered 2013-02-27: 0.4 mg via INTRAVENOUS
  Filled 2013-02-26: qty 5

## 2013-02-26 MED ORDER — DILTIAZEM HCL ER 240 MG PO CP24
240.0000 mg | ORAL_CAPSULE | Freq: Every day | ORAL | Status: DC
  Administered 2013-02-26 – 2013-02-27 (×2): 240 mg via ORAL
  Filled 2013-02-26 (×2): qty 1

## 2013-02-26 MED ORDER — ONDANSETRON HCL 4 MG/2ML IJ SOLN: 4.0000 mg | Freq: Four times a day (QID) | INTRAMUSCULAR | Status: DC | PRN

## 2013-02-26 MED ORDER — SODIUM CHLORIDE 0.9 % IV SOLN
250.0000 mL | INTRAVENOUS | Status: DC | PRN
  Administered 2013-02-26: 250 mL via INTRAVENOUS

## 2013-02-26 MED ORDER — LISINOPRIL 20 MG PO TABS
20.0000 mg | ORAL_TABLET | Freq: Every day | ORAL | Status: DC
  Administered 2013-02-26 – 2013-02-27 (×2): 20 mg via ORAL
  Filled 2013-02-26 (×2): qty 1

## 2013-02-26 MED ORDER — SODIUM CHLORIDE 0.9 % IJ SOLN: 3.0000 mL | Freq: Two times a day (BID) | INTRAMUSCULAR | Status: DC

## 2013-02-26 MED ORDER — OMEPRAZOLE MAGNESIUM 20 MG PO TBEC: 20.0000 mg | DELAYED_RELEASE_TABLET | Freq: Two times a day (BID) | ORAL | Status: DC

## 2013-02-26 MED ORDER — ENOXAPARIN SODIUM 40 MG/0.4ML ~~LOC~~ SOLN
40.0000 mg | SUBCUTANEOUS | Status: DC
  Administered 2013-02-26 – 2013-02-27 (×2): 40 mg via SUBCUTANEOUS
  Filled 2013-02-26 (×2): qty 0.4

## 2013-02-26 MED ORDER — CYCLOBENZAPRINE HCL 10 MG PO TABS
10.0000 mg | ORAL_TABLET | Freq: Two times a day (BID) | ORAL | Status: DC
  Administered 2013-02-26 – 2013-02-27 (×3): 10 mg via ORAL
  Filled 2013-02-26 (×4): qty 1

## 2013-02-26 NOTE — ED Notes (Signed)
Patient ambulated to restroom. Patient tolerated well.

## 2013-02-26 NOTE — ED Notes (Signed)
PT. REPORTS INTERMITTENT LEFT ANTERIOR RIBCAGE PAIN WITH SLIGHT SOB AND OCCASIONAL DRY COUGH FOR 1 WEEK , DENIES NAUSEA OR DIAPHORESIS. NO INJURY OR FALL.

## 2013-02-26 NOTE — H&P (Addendum)
[facility] History and Physical  Aaron Black NWG:[id number] DOB: 1955/08/13    PCP:   Rogelia Boga, MD   Chief Complaint: substernal chest pain.  HPI: Aaron Black is an 57 y.o. male with hx of hyperlipidemia, HTN on lisinopril and cardiazem, anxiety, gout, presents to the ER with 2 days hx of intermittent chest pain.  He had intermittent CP lasting 10 minutes each, nonexertional and nonpleuritic.  Last night however, the pain was more severe, so he presented to the ER.  There has been no fever, chills, or coughs.  He didn't have diaphoresis, nausea, vomiting or shortness of breath.  It should be noted that he has been in signifcant amount of stress as his brother has recently passed away of ? MI.  In the ER, he is pain free after given NTG x3.  His work up included negative initial cardiac marker, negative chest xray, and normal EKG.  He admitted to having hx of GERD.  Hospitalist was asked to admit him for chestpain.  Rewiew of Systems:  Constitutional: Negative for malaise, fever and chills. No significant weight loss or weight gain Eyes: Negative for eye pain, redness and discharge, diplopia, visual changes, or flashes of light. ENMT: Negative for ear pain, hoarseness, nasal congestion, sinus pressure and sore throat. No headaches; tinnitus, drooling, or problem swallowing. Cardiovascular: Negative for  palpitations, diaphoresis, dyspnea and peripheral edema. ; No orthopnea, PND Respiratory: Negative for cough, hemoptysis, wheezing and stridor. No pleuritic chestpain. Gastrointestinal: Negative for nausea, vomiting, diarrhea, constipation, abdominal pain, melena, blood in stool, hematemesis, jaundice and rectal bleeding.    Genitourinary: Negative for frequency, dysuria, incontinence,flank pain and hematuria; Musculoskeletal: Negative for back pain and neck pain. Negative for swelling and trauma.;  Skin: . Negative for pruritus, rash, abrasions, bruising and skin  lesion.; ulcerations Neuro: Negative for headache, lightheadedness and neck stiffness. Negative for weakness, altered level of consciousness , altered mental status, extremity weakness, burning feet, involuntary movement, seizure and syncope.  Psych: negative for anxiety, depression, insomnia, tearfulness, panic attacks, hallucinations, paranoia, suicidal or homicidal ideation    Past Medical History  Diagnosis Date  . Diverticulosis of colon   . GERD (gastroesophageal reflux disease)   . Herniated disc   . Sleep apnea   . Arthritis   . Anxiety   . Hypertension   . Colon polyps   . B12 deficiency   . Anemia   . Hyperlipidemia   . Gout   . Pleurisy     Past Surgical History  Procedure Laterality Date  . Back surgery      lower  . Rotator cuff repair  August 2000, January 2002, July 2008, July 2009  . Neck surgery  January 2005, March 2009    C-Spine    Medications:  HOME MEDS: Prior to Admission medications   Medication Sig Start Date End Date Taking? Authorizing Provider  ALPRAZolam Prudy Feeler) 0.5 MG tablet Take 0.5 mg by mouth 3 (three) times daily as needed for sleep or anxiety.   Yes Historical Provider, MD  cyanocobalamin (,VITAMIN B-12,) 1000 MCG/ML injection Inject 1 mL (1,000 mcg total) into the muscle every 30 (thirty) days. 05/14/12  Yes Gordy Savers, MD  cyclobenzaprine (FLEXERIL) 10 MG tablet Take 10 mg by mouth 2 (two) times daily.    Yes Historical Provider, MD  diclofenac (VOLTAREN) 75 MG EC tablet Take 1 tablet (75 mg total) by mouth 2 (two) times daily. 09/25/11  Yes Gordy Savers, MD  diltiazem [facility]  XR) 240 MG 24 hr capsule Take 240 mg by mouth daily.   Yes Historical Provider, MD  HYDROcodone-acetaminophen (NORCO/VICODIN) 5-325 MG per tablet Take 1 tablet by mouth every 6 (six) hours as needed for pain.   Yes Historical Provider, MD  lisinopril (PRINIVIL,ZESTRIL) 20 MG tablet Take 20 mg by mouth daily.   Yes Historical Provider, MD  omeprazole  (PRILOSEC OTC) 20 MG tablet Take 20 mg by mouth daily.   Yes Historical Provider, MD  simvastatin (ZOCOR) 20 MG tablet Take 20 mg by mouth every evening.   Yes Historical Provider, MD  Syringe/Needle, Disp, (B-D ECLIPSE SYRINGE) 30G X 1/2" 1 ML MISC every 30 (thirty) days.      Historical Provider, MD     Allergies:  Allergies  Allergen Reactions  . Percodan (Oxycodone-Aspirin) Rash    Percodan caused rash---but he can tolerate Percocet and aspirin on their own     Social History:   reports that he has been smoking.  He has never used smokeless tobacco. He reports that  drinks alcohol. He reports that he does not use illicit drugs.  Family History: Family History  Problem Relation Age of Onset  . Bone cancer Mother   . Colon polyps Mother   . Squamous cell carcinoma Mother   . Heart disease Mother   . Hypertension Father   . Colon polyps Father   . Diabetes Father   . Hypertension Brother      Physical Exam: Filed Vitals:   02/26/13 0530 02/26/13 0545 02/26/13 0600 02/26/13 0615  BP: 118/76 116/81 118/82 129/85  Pulse: 83 83 91 76  Temp:      TempSrc:      Resp: 16 15 14 20   SpO2: 97% 97% 99% 99%   Blood pressure 129/85, pulse 76, temperature 98.3 F (36.8 C), temperature source Oral, resp. rate 20, SpO2 99.00%.  GEN:  Pleasant patient lying in the stretcher in no acute distress; cooperative with exam. PSYCH:  alert and oriented x4; does not appear anxious or depressed; affect is appropriate. HEENT: Mucous membranes pink and anicteric; PERRLA; EOM intact; no cervical lymphadenopathy nor thyromegaly or carotid bruit; no JVD; There were no stridor. Neck is very supple. Breasts:: Not examined CHEST WALL: No tenderness CHEST: Normal respiration, clear to auscultation bilaterally.  HEART: Regular rate and rhythm.  There are no murmur, rub, or gallops.   BACK: No kyphosis or scoliosis; no CVA tenderness ABDOMEN: soft and non-tender; no masses, no organomegaly, normal  abdominal bowel sounds; no pannus; no intertriginous candida. There is no rebound and no distention. Rectal Exam: Not done EXTREMITIES: No bone or joint deformity; age-appropriate arthropathy of the hands and knees; no edema; no ulcerations.  There is no calf tenderness. Genitalia: not examined PULSES: 2+ and symmetric SKIN: Normal hydration no rash or ulceration CNS: Cranial nerves 2-12 grossly intact no focal lateralizing neurologic deficit.  Speech is fluent; uvula elevated with phonation, facial symmetry and tongue midline. DTR are normal bilaterally, cerebella exam is intact, barbinski is negative and strengths are equaled bilaterally.  No sensory loss.   Labs on Admission:  Basic Metabolic Panel:  Recent Labs Lab 02/26/13 0135  NA 135  K 3.9  CL 100  CO2 21  GLUCOSE 91  BUN 15  CREATININE 1.06  CALCIUM 9.4   Liver Function Tests: No results found for this basename: AST, ALT, ALKPHOS, BILITOT, PROT, ALBUMIN,  in the last 168 hours No results found for this basename: LIPASE, AMYLASE,  in  the last 168 hours No results found for this basename: AMMONIA,  in the last 168 hours CBC:  Recent Labs Lab 02/26/13 0135  WBC 5.6  HGB 13.6  HCT 37.7*  MCV 91.5  PLT 258   Cardiac Enzymes: No results found for this basename: CKTOTAL, CKMB, CKMBINDEX, TROPONINI,  in the last 168 hours  CBG: No results found for this basename: GLUCAP,  in the last 168 hours   Radiological Exams on Admission: Dg Ribs Unilateral W/chest Left  02/26/2013   *RADIOLOGY REPORT*  Clinical Data: Left chest pain.  LEFT RIBS AND CHEST - 3+ VIEW  Comparison: PA and lateral chest 09/14/2012.  Findings: Lungs are clear.  Heart size is normal.  No pneumothorax or pleural fluid.  No rib fracture is identified.  IMPRESSION: Negative exam.   Original Report Authenticated By: Holley Dexter, M.D.    EKG: Independently reviewed. It is normal.   Assessment/Plan Present on Admission:  . Chest pain at rest .  ANXIETY . GERD . HERNIATED DISC . HYPERTENSION . Tobacco abuse . HYPERLIPIDEMIA  PLAN:  WIll admit him for chest pain r/out, though I don't think he has cardiac pain.  He does have significant stress, along with HTN, hyperlipidemia, sedentary lifestyle, and tobacco use.  WIll cycle his troponin and obtain a cardiac ECHO.  He probably would benefit getting a nuclear or a stress ECHO.  Currently, I have continued his home meds including his HTN meds and his statin.  Will add an ASA to his regimen.  It is possible that this was GERD and esophageal spasm relieved by NTG as well. [place] generally does not present like this.  He is stable, full code, and will be admitted to [facility] service.  He has to stop smoking.  Thank you for allowing me to partake in the care of this nice gentleman.  Other plans as per orders.  Code Status: FULL Unk Lightning, MD. [facility] Pager [phone] 7pm to 7am.  02/26/2013, 6:47 AM

## 2013-02-26 NOTE — ED Notes (Signed)
Report given to Burtis Junes, RN

## 2013-02-26 NOTE — Consult Note (Signed)
CARDIOLOGY CONSULT NOTE  Patient ID: Aaron Black MRN: [id number], DOB/AGE: [birth year]   Admit date: 02/26/2013 Date of Consult: 02/26/2013   Primary Physician: Rogelia Boga, MD Primary Cardiologist: new - seen by P. [place], MD   Pt. Profile  57 y/o male without prior cardiac hx who presented overnight 2/2 chest pain.  Problem List  Past Medical History  Diagnosis Date  . Diverticulosis of colon   . GERD (gastroesophageal reflux disease)   . Cervical disc disease     a. 07/2003 ant cervical deomcpression and fusion C5-6/C6-7;  b. 09/2007 post cervical laminectomy C4-5 with scres and arthrodesis C4-C7.  Marland Kitchen Sleep apnea   . Arthritis   . Anxiety   . Hypertension   . Colon polyps   . B12 deficiency   . Anemia   . Hyperlipidemia   . Gout   . Pleurisy   . Tobacco abuse     a. 40 yrs, 1.5-3 ppd over that time.  . Chronic lower back pain     Past Surgical History  Procedure Laterality Date  . Back surgery      lower  . Rotator cuff repair  August 2000, January 2002, July 2008, July 2009  . Neck surgery  January 2005, March 2009    C-Spine    Allergies  Allergies  Allergen Reactions  . Percodan (Oxycodone-Aspirin) Rash    Percodan caused rash---but he can tolerate Percocet and aspirin on their own    HPI   57 y/o male with h/o HTN/HL/Tob Abuse.  He has no prior personal h/o CAD, though his brother died suddenly @ age 47, 14 wks ago.  He has had significant depression/anxiety since that time.  He is relatively sedentary @ home.  He was in his USOH until about 1 week ago when he began to experience left sided chest discomfort and pressure, under his anterior lower left rib cage, w/o associated Ss or aggravating/alleviating factors, occurring exclusively upon awakening in the morning and resolving spontaneously after about 5 mins.  Ss would never recur during the day.  Last night, he awoke @ ~ 11:30 PM to use the bathroom and noted recurrent left lower chest  discomfort similar to what had been occurring throughout the week but more severe.  Again, there were no associated Ss.  He got up and showered, hoping that the warm water would relieve his discomfort but when it didn't he awakened his wife and drove to the ED (approx 1-2 hrs after onset).  In the ED, ECG was non-acute and initial troponin was nl.  He was eventually given SL ntg x 3 with relief of discomfort.  Total duration of c/p was "@ least 4 hrs."  Approximately 45 mins after achieving relief, he noted recurrence @ a more mild severity and this has been persistent.  Repeat troponin is nl.  Pain is somewhat worse with coughing but no worse with palpation, deep breathing, or position changes.  Inpatient Medications  . aspirin EC  81 mg Oral Daily  . cyclobenzaprine  10 mg Oral BID  . diltiazem  240 mg Oral Daily  . enoxaparin (LOVENOX) injection  40 mg Subcutaneous Q24H  . lisinopril  20 mg Oral Daily  . pantoprazole  40 mg Oral BID AC  . simvastatin  20 mg Oral QPM  . sodium chloride  3 mL Intravenous Q12H  . sodium chloride  3 mL Intravenous Q12H   Family History Family History  Problem Relation Age of Onset  .  Bone cancer Mother   . Colon polyps Mother   . Squamous cell carcinoma Mother     died @ 84  . Hypertension Father   . Colon polyps Father   . Diabetes Father     borderline  . Congestive Heart Failure Father     alive @ 58  . Sudden death Brother     died @ 42  . Other Brother     died in MVA @ 1 - struck by drunk driver  . Other Brother     died in MVA @ 23 - struck by drunk driver    Social History History   Social History  . Marital Status: Married    Spouse Name: N/A    Number of Children: N/A  . Years of Education: N/A   Occupational History  . Not on file.   Social History Main Topics  . Smoking status: Current Every Day Smoker -- 2.00 packs/day for 40 years  . Smokeless tobacco: Never Used     Comment: Currently smoking 1.5 ppd.  Has previously  smoked up to 3 ppd and did so for about 20 yrs.  . Alcohol Use: Yes     Comment: at least 3 beers/night.  . Drug Use: No  . Sexually Active: Not on file   Other Topics Concern  . Not on file   Social History Narrative   Lives in [place] with wife.  Does not work.  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    Review of Systems  General:  No chills, fever, night sweats or weight changes.  Cardiovascular:  +++ chest pain, no dyspnea on exertion, edema, orthopnea, palpitations, paroxysmal nocturnal dyspnea. Dermatological: No rash, lesions/masses Respiratory: No cough, dyspnea Urologic: No hematuria, dysuria Abdominal:   No nausea, vomiting, diarrhea, bright red blood per rectum, melena, or hematemesis Neurologic:  No visual changes, wkns, changes in mental status. MSK:  Chronic lbp with radiation of pain to r hip and leg. All other systems reviewed and are otherwise negative except as noted above.  Physical Exam  Blood pressure 149/89, pulse 76, temperature 98.2 F (36.8 C), temperature source Oral, resp. rate 18, height 5\' 7"  (1.702 m), weight 157 lb 3.2 oz (71.305 kg), SpO2 98.00%.  General: Pleasant, NAD Psych: Normal affect. Neuro: Alert and oriented X 3. Moves all extremities spontaneously. HEENT: Normal  Neck: Supple without bruits or JVD. Lungs:  Resp regular and unlabored, CTA. Heart: RRR no s3, s4, or murmurs. Abdomen: Soft, non-tender, non-distended, BS + x 4.  Extremities: No clubbing, cyanosis or edema. DP/PT/Radials 2+ and equal bilaterally.  Labs  Trop i, poc: 0.01 Lab Results  Component Value Date   TROPONINI <0.30 02/26/2013    Lab Results  Component Value Date   WBC 5.6 02/26/2013   HGB 13.6 02/26/2013   HCT 37.7* 02/26/2013   MCV 91.5 02/26/2013   PLT 258 02/26/2013     Recent Labs Lab 02/26/13 0135  NA 135  K 3.9  CL 100  CO2 21  BUN 15  CREATININE 1.06  CALCIUM 9.4  GLUCOSE 91   Lab Results  Component Value Date   CHOL 236* 10/03/2012   HDL  72.90 10/03/2012   TRIG 155.0* 10/03/2012   Radiology/Studies  Dg Ribs Unilateral W/chest Left  02/26/2013   *RADIOLOGY REPORT*  Clinical Data: Left chest pain.  LEFT RIBS AND CHEST - 3+ VIEW  Comparison: PA and lateral chest 09/14/2012.  Findings: Lungs are clear.  Heart  size is normal.  No pneumothorax or pleural fluid.  No rib fracture is identified.  IMPRESSION: Negative exam.   Original Report Authenticated By: Holley Dexter, M.D.   ECG  Rsr, 82, no acute st/t changes.  ASSESSMENT AND PLAN  1.  Left sided chest pain:  Somewhat atypical.  Despite prolonged Ss, CE are negative and ECG is non-acute.  RF include tob abuse, h/o htn/hl, and brother that died suddenly @ 20, possibly r/t MI.  He cont to have mild pain.  Will plan on lexiscan MV as an inpatient.  He does not think that he could walk on a treadmill.  I've talked to nuc med and they can not accommodate him today, thus, we will schedule for tomorrow morning.  Cont asa, statin.  2.  HTN: BP up this AM.  Follow throughout the day on current doses of dilt/Acei.  3.  HL:  Cont statin.  TC 236 in March.  4.  Tob/ETOH abuse:  Cessation advised.  Signed, Nicolasa Ducking, NP 02/26/2013, 10:27 AM Patient seen and examined and history reviewed. Agree with above findings and plan. 57 yo WM admitted with prolonged chest pain. Pain localized to the left lower anterior rib cage. No other associated symptoms. Pain has occurred off and on for 2 weeks but usually resolved after 5 minutes. Last night pain did not resolve for several hours. Cardiac exam is normal. Ecg is normal. Initial 2 sets of troponin are normal. Based on history his pain sounds noncardiac but TIMI risk score is moderate. I would recommend further evaluation with stress testing. He is unable to walk on a treadmill due to back and hip issues. Will plan on Lexiscan myoview study in am.  Theron Arista [facility] 02/26/2013 11:00 AM

## 2013-02-26 NOTE — ED Notes (Signed)
Called report to 3W, they advised in the middle of shift change and unable to take report at this time.   They advised they will callback when out of report.

## 2013-02-26 NOTE — ED Provider Notes (Signed)
CSN: [id number]     Arrival date & time 02/26/13  0112 History     First MD Initiated Contact with Patient 02/26/13 [phone]     Chief Complaint  Patient presents with  . Chest Pain   (Consider location/radiation/quality/duration/timing/severity/associated sxs/prior Treatment) Patient is a 57 y.o. male presenting with chest pain. The history is provided by the patient.  Chest Pain Pain location:  L lateral chest Pain quality: aching, pressure and sharp   Pain radiates to:  Does not radiate Pain radiates to the back: no   Pain severity:  Mild Onset quality:  Sudden Duration:  3 hours Timing:  Constant Progression:  Improving Chronicity:  New Associated symptoms: shortness of breath   Associated symptoms: no abdominal pain, no cough, no dizziness, no fever, no headache, no nausea, no palpitations and not vomiting  Back pain: chronic due to disc problems.    Aaron Black is a 57 y.o. male who presents to the ED with left chest pain that has been off and on for the past week or so. He has difficulty describing the pain. States that is is a pressure feeling in his left chest and rib area. Has had some shortness of breath but thought it was due to pain. He did have some pain today in his upper arm but not in shoulder, neck or jaw.  He states he has been going through a lot recently. His brother who was 30 years old died 4 weeks ago with a massive MI. The patient found him after he died in his sleep. Then his father had to go in a nursing home. The patient states he has a lot of bills coming in and he has had a lot of stress. He took a Valium before he came in tonight and is feeling a little better now. He has the medication from his neuro surgeon because he has herniated disc and is waiting to have surgery.    Patient smokes one and a half packs per day x 40 years.   Past Medical History  Diagnosis Date  . Diverticulosis of colon   . GERD (gastroesophageal reflux disease)   . Herniated disc    . Sleep apnea   . Arthritis   . Anxiety   . Hypertension   . Colon polyps   . B12 deficiency   . Anemia   . Hyperlipidemia   . Gout   . Pleurisy    Past Surgical History  Procedure Laterality Date  . Back surgery      lower  . Rotator cuff repair  August 2000, January 2002, July 2008, July 2009  . Neck surgery  January 2005, March 2009    C-Spine   Family History  Problem Relation Age of Onset  . Bone cancer Mother   . Colon polyps Mother   . Squamous cell carcinoma Mother   . Heart disease Mother   . Hypertension Father   . Colon polyps Father   . Diabetes Father   . Hypertension Brother    History  Substance Use Topics  . Smoking status: Current Every Day Smoker  . Smokeless tobacco: Never Used  . Alcohol Use: Yes    Review of Systems  Constitutional: Negative for fever and chills.  HENT: Negative for neck pain.   Respiratory: Positive for chest tightness and shortness of breath. Negative for cough and wheezing.   Cardiovascular: Positive for chest pain. Negative for palpitations and leg swelling.  Gastrointestinal: Negative for nausea, vomiting  and abdominal pain.  Genitourinary: Negative for dysuria, urgency, frequency and flank pain.  Musculoskeletal: Back pain: chronic due to disc problems.  Skin: Negative for wound.  Neurological: Negative for dizziness, seizures, syncope and headaches.  Psychiatric/Behavioral: The patient is not nervous/anxious.     Allergies  Percodan  Home Medications   Current Outpatient Rx  Name  Route  Sig  Dispense  Refill  . ALPRAZolam (XANAX) 0.5 MG tablet   Oral   Take 0.5 mg by mouth 3 (three) times daily as needed for sleep or anxiety.         . cyanocobalamin (,VITAMIN B-12,) 1000 MCG/ML injection   Intramuscular   Inject 1 mL (1,000 mcg total) into the muscle every 30 (thirty) days.   10 mL   2   . cyclobenzaprine (FLEXERIL) 10 MG tablet   Oral   Take 10 mg by mouth 2 (two) times daily.          .  diclofenac (VOLTAREN) 75 MG EC tablet   Oral   Take 1 tablet (75 mg total) by mouth 2 (two) times daily.   180 tablet   6   . diltiazem (DILACOR XR) 240 MG 24 hr capsule   Oral   Take 240 mg by mouth daily.         Marland Kitchen HYDROcodone-acetaminophen (NORCO/VICODIN) 5-325 MG per tablet   Oral   Take 1 tablet by mouth every 6 (six) hours as needed for pain.         Marland Kitchen lisinopril (PRINIVIL,ZESTRIL) 20 MG tablet   Oral   Take 20 mg by mouth daily.         Marland Kitchen omeprazole (PRILOSEC OTC) 20 MG tablet   Oral   Take 20 mg by mouth daily.         . simvastatin (ZOCOR) 20 MG tablet   Oral   Take 20 mg by mouth every evening.         . Syringe/Needle, Disp, (B-D ECLIPSE SYRINGE) 30G X 1/2" 1 ML MISC      every 30 (thirty) days.            BP 106/78  Pulse 98  Temp(Src) 98.3 F (36.8 C) (Oral)  Resp 18  SpO2 97% Physical Exam  Nursing note and vitals reviewed. Constitutional: He is oriented to person, place, and time. He appears well-developed and well-nourished. No distress.  HENT:  Head: Normocephalic and atraumatic.  Eyes: Conjunctivae and EOM are normal.  Neck: Trachea normal and normal range of motion. Neck supple. Normal carotid pulses and no JVD present. Carotid bruit is not present.  Cardiovascular: Normal rate.   Pulmonary/Chest: Effort normal and breath sounds normal. He exhibits no tenderness.  Abdominal: Soft. Bowel sounds are normal. There is no tenderness.  Musculoskeletal: Normal range of motion. He exhibits no edema and no tenderness.  Neurological: He is alert and oriented to person, place, and time. No cranial nerve deficit.  Skin: Skin is warm and dry.  Psychiatric: He has a normal mood and affect. His behavior is normal.    ED Course   Procedures Results for orders placed during the hospital encounter of 02/26/13 (from the past 24 hour(s))  CBC     Status: Abnormal   Collection Time    02/26/13  1:35 AM      Result Value Range   WBC 5.6  4.0 -  10.5 K/uL   RBC 4.12 (*) 4.22 - 5.81 MIL/uL   Hemoglobin 13.6  13.0 - 17.0 g/dL   HCT 96.2 (*) 95.2 - 84.1 %   MCV 91.5  78.0 - 100.0 fL   MCH 33.0  26.0 - 34.0 pg   MCHC 36.1 (*) 30.0 - 36.0 g/dL   RDW 32.4  40.1 - 02.7 %   Platelets 258  150 - 400 K/uL  BASIC METABOLIC PANEL     Status: Abnormal   Collection Time    02/26/13  1:35 AM      Result Value Range   Sodium 135  135 - 145 mEq/L   Potassium 3.9  3.5 - 5.1 mEq/L   Chloride 100  96 - 112 mEq/L   CO2 21  19 - 32 mEq/L   Glucose, Bld 91  70 - 99 mg/dL   BUN 15  6 - 23 mg/dL   Creatinine, Ser 2.53  0.50 - 1.35 mg/dL   Calcium 9.4  8.4 - 66.4 mg/dL   GFR calc non Af Amer 77 (*) >90 mL/min   GFR calc Af Amer 89 (*) >90 mL/min  POCT I-STAT TROPONIN I     Status: None   Collection Time    02/26/13  1:38 AM      Result Value Range   Troponin i, poc 0.01  0.00 - 0.08 ng/mL   Comment 3             Dg Ribs Unilateral W/chest Left  02/26/2013   *RADIOLOGY REPORT*  Clinical Data: Left chest pain.  LEFT RIBS AND CHEST - 3+ VIEW  Comparison: PA and lateral chest 09/14/2012.  Findings: Lungs are clear.  Heart size is normal.  No pneumothorax or pleural fluid.  No rib fracture is identified.  IMPRESSION: Negative exam.   Original Report Authenticated By: Holley Dexter, M.D.    Patient allergic to ASA. After Nitroglycerin pain went from 4 to 1/10.  05:55 pain is now zero.  MDM  Consult with Dr. Conley Rolls and he will admit. Temporary admit orders written.  57 y.o. male with left chest pain. Admit   Hope Orlene Och, NP 02/26/13 0600

## 2013-02-26 NOTE — Progress Notes (Signed)
*  PRELIMINARY RESULTS* Echocardiogram 2D Echocardiogram has been performed.  Aaron Black 02/26/2013, 4:29 PM

## 2013-02-26 NOTE — Progress Notes (Signed)
UR Completed Gomer France Graves-Bigelow, RN,BSN [phone]  

## 2013-02-26 NOTE — Progress Notes (Signed)
[facility] PROGRESS NOTE  Aaron Black QMV:[id number] DOB: June 02, 1956 DOA: 02/26/2013 PCP: Rogelia Boga, MD I have seen and examined pt who is a 57yo admitted this am by Dr Conley Rolls with history of hypertension, hyperlipidemia, GERD and anxiety who presents with a 2 day history of substernal chest pain. He states the pain was relieved by nitroglycerin-went from 10/10 >> 2/10 currently. Troponin in ED negative x1. He states his mother sees Dr. Elease Hashimoto and request LB cards. I have consulted cardiology, will continue current management as per Dr. Conley Rolls, follow cardiac enzymes and pending echo.     [name]  [facility] Pager [phone]. If 7PM-7AM, please contact night-coverage at [email or web address], password [facility] 02/26/2013, 8:41 AM  LOS: 0 days

## 2013-02-27 ENCOUNTER — Observation Stay (HOSPITAL_COMMUNITY): Payer: Managed Care, Other (non HMO)

## 2013-02-27 ENCOUNTER — Encounter (HOSPITAL_COMMUNITY): Payer: Managed Care, Other (non HMO)

## 2013-02-27 DIAGNOSIS — R079 Chest pain, unspecified: Secondary | ICD-10-CM

## 2013-02-27 DIAGNOSIS — F17200 Nicotine dependence, unspecified, uncomplicated: Secondary | ICD-10-CM

## 2013-02-27 DIAGNOSIS — I10 Essential (primary) hypertension: Secondary | ICD-10-CM

## 2013-02-27 LAB — BASIC METABOLIC PANEL
CO2: 25 mEq/L (ref 19–32)
Calcium: 9.5 mg/dL (ref 8.4–10.5)
GFR calc non Af Amer: 71 mL/min — ABNORMAL LOW (ref >90)
Sodium: 139 mEq/L (ref 135–145)

## 2013-02-27 MED ORDER — OMEPRAZOLE MAGNESIUM 20 MG PO TBEC: 40.0000 mg | DELAYED_RELEASE_TABLET | Freq: Every day | ORAL | Status: DC

## 2013-02-27 MED ORDER — TECHNETIUM TC 99M SESTAMIBI GENERIC - CARDIOLITE
30.0000 | Freq: Once | INTRAVENOUS | Status: AC | PRN
  Administered 2013-02-27: 30 via INTRAVENOUS

## 2013-02-27 MED ORDER — TECHNETIUM TC 99M SESTAMIBI GENERIC - CARDIOLITE
10.0000 | Freq: Once | INTRAVENOUS | Status: AC | PRN
  Administered 2013-02-27: 10 via INTRAVENOUS

## 2013-02-27 NOTE — Progress Notes (Signed)
TELEMETRY: Reviewed telemetry pt in NSR: Filed Vitals:   02/26/13 1100 02/26/13 1526 02/26/13 2115 02/27/13 0412  BP: 158/102 122/79 165/96 120/83  Pulse:  88 78 74  Temp:  98.3 F (36.8 C) 98.3 F (36.8 C) 97.8 F (36.6 C)  TempSrc:  Oral Oral Oral  Resp:  18 18 18   Height:      Weight:      SpO2:  99% 99% 97%    Intake/Output Summary (Last 24 hours) at 02/27/13 0829 Last data filed at 02/27/13 0600  Gross per 24 hour  Intake    120 ml  Output   1000 ml  Net   -880 ml    SUBJECTIVE No further chest pain.  LABS: Basic Metabolic Panel:  Recent Labs  16/10/96 0135 02/27/13 0502  NA 135 139  K 3.9 4.0  CL 100 105  CO2 21 25  GLUCOSE 91 109*  BUN 15 10  CREATININE 1.06 1.13  CALCIUM 9.4 9.5   CBC:  Recent Labs  02/26/13 0135  WBC 5.6  HGB 13.6  HCT 37.7*  MCV 91.5  PLT 258   Cardiac Enzymes:  Recent Labs  02/26/13 0914 02/26/13 1420 02/26/13 2210  TROPONINI <0.30 <0.30 <0.30   Thyroid Function Tests:  Recent Labs  02/26/13 1420  TSH 0.296*   Anemia Panel: No results found for this basename: VITAMINB12, FOLATE, FERRITIN, TIBC, IRON, RETICCTPCT,  in the last 72 hours  Radiology/Studies:  Dg Ribs Unilateral W/chest Left  02/26/2013   *RADIOLOGY REPORT*  Clinical Data: Left chest pain.  LEFT RIBS AND CHEST - 3+ VIEW  Comparison: PA and lateral chest 09/14/2012.  Findings: Lungs are clear.  Heart size is normal.  No pneumothorax or pleural fluid.  No rib fracture is identified.  IMPRESSION: Negative exam.   Original Report Authenticated By: Holley Dexter, M.D.   Echo: Study Conclusions  - Left ventricle: The cavity size was normal. Wall thickness was increased in a pattern of mild LVH. The estimated ejection fraction was 55%. Although no diagnostic regional wall motion abnormality was identified, this possibility cannot be completely excluded on the basis of this study. Doppler parameters are consistent with abnormal left ventricular  relaxation (grade 1 diastolic dysfunction). - Aortic valve: There was no stenosis. - Mitral valve: No significant regurgitation. - Right ventricle: The cavity size was normal. Systolic function was normal. - Pulmonary arteries: No complete TR doppler jet so unable to estimate PA systolic pressure. - Inferior vena cava: The vessel was normal in size; the respirophasic diameter changes were in the normal range (= 50%); findings are consistent with normal central venous pressure. Impressions:  - Normal LV size with mild LV hypertrophy, EF 55%. Normal RV size and systolic function. No significant valvular abnormalities.   PHYSICAL EXAM General: Well developed, well nourished, in no acute distress. Head: Normocephalic, atraumatic, sclera non-icteric, no xanthomas, nares are without discharge. Neck: Negative for carotid bruits. JVD not elevated. Lungs: Clear bilaterally to auscultation without wheezes, rales, or rhonchi. Breathing is unlabored. Heart: RRR S1 S2 without murmurs, rubs, or gallops.  Abdomen: Soft, non-tender, non-distended with normoactive bowel sounds. No hepatomegaly. No rebound/guarding. No obvious abdominal masses. Msk:  Strength and tone appears normal for age. Extremities: No clubbing, cyanosis or edema.  Distal pedal pulses are 2+ and equal bilaterally. Neuro: Alert and oriented X 3. Moves all extremities spontaneously. Psych:  Responds to questions appropriately with a normal affect.  ASSESSMENT AND PLAN: 1. Atypical chest pain. Cardiac enzymes  are all negative. Echo is unremarkable. Will proceed with Steffanie Dunn study today. If negative can DC home today.  2. Htn 3. HL 4. Tobacco abuse. Rec. Smoking cessation.  Principal Problem:   Chest pain at rest Active Problems:   HYPERLIPIDEMIA   ANXIETY   HYPERTENSION   GERD   HERNIATED DISC   Tobacco abuse    Signed, Gianfranco Araki [place] MD,FACC 02/27/2013 8:32 AM

## 2013-02-27 NOTE — Discharge Summary (Signed)
Physician Discharge Summary  Aaron Black ZOX:[id number] DOB: [birth year] DOA: 02/26/2013  PCP: Aaron Boga, MD  Admit date: 02/26/2013 Discharge date: 02/27/2013  Time spent: <30 minutes  Recommendations for Outpatient Follow-up:      Follow-up Information   Follow up with Aaron Boga, MD. (as scheduled and as needed)    Contact information:   [address] [place] [address] [phone]        Discharge Diagnoses:  Principal Problem:   Chest pain at rest Active Problems:   HYPERLIPIDEMIA   ANXIETY   HYPERTENSION   GERD   HERNIATED DISC   Tobacco abuse   Discharge Condition: Improved/  Diet recommendation: Heart healthy  Filed Weights   02/26/13 0839  Weight: 71.305 kg (157 lb 3.2 oz)    History of present illness:  Aaron Black is an 57 y.o. male with hx of hyperlipidemia, HTN on lisinopril and cardiazem, anxiety, gout, presents to the ER with 2 days hx of intermittent chest pain. He had intermittent CP lasting 10 minutes each, nonexertional and nonpleuritic. Last night however, the pain was more severe, so he presented to the ER. There has been no fever, chills, or coughs. He didn't have diaphoresis, nausea, vomiting or shortness of breath. It should be noted that he has been in signifcant amount of stress as his brother has recently passed away of ? MI. In the ER, he is pain free after given NTG x3. His work up included negative initial cardiac marker, negative chest xray, and normal EKG. He admitted to having hx of GERD. Hospitalist was asked to admit him for chestpain.   Hospital Course:  Present on Admission:  . Chest pain at rest -As discussed above, upon admission cardiac enzymes were cycled and came back negative for MI  -his chest pain resolved. -Echocardiogram did not show any wall motion abnormalities  -cardiology was consulted and a stress test was done and came back negative today - Per cardiology patient of the  discharge, no further evaluation recommended  -His PPI was increased for GERD and his to continue Xanax for anxiety  . ANXIETY -Continue outpatient medications  . GERD -As Prilosec was increased to 40 daily is to followup with his PCP  . HERNIATED DISC -Followup with Aaron Black aspreviously directed  . HYPERTENSION -Continue outpatient  . Tobacco abuse -Patient counseled to quit  . HYPERLIPIDEMIA -Continue Zocor upon discharge  Procedures:  Stress Myoview-negative for ischemia  2-D echo  Impressions:  - Normal LV size with mild LV hypertrophy, EF 55%. Normal RV size and systolic function. No significant valvular abnormalities.   Consultations:  Cardiology  Discharge Exam: Filed Vitals:   02/27/13 1344  BP: 142/90  Pulse: 85  Temp: 98.3 F (36.8 C)  Resp:    Exam:  General: alert & oriented x3  In NAD Cardiovascular: RRR, nl S1 s2 Respiratory: CTAB Abdomen: soft +BS NT/ND, no masses palpable Extremities: No cyanosis and no edema*    Discharge Instructions  Discharge Orders   Future Orders Complete By Expires     Diet - low sodium heart healthy  As directed     Increase activity slowly  As directed         Medication List         ALPRAZolam 0.5 MG tablet  Commonly known as:  XANAX  Take 0.5 mg by mouth 3 (three) times daily as needed for sleep or anxiety.     B-D ECLIPSE SYRINGE 30G X 1/2"  1 ML Misc  Generic drug:  Syringe/Needle (Disp)  every 30 (thirty) days.     cyanocobalamin 1000 MCG/ML injection  Commonly known as:  (VITAMIN B-12)  Inject 1 mL (1,000 mcg total) into the muscle every 30 (thirty) days.     cyclobenzaprine 10 MG tablet  Commonly known as:  FLEXERIL  Take 10 mg by mouth 2 (two) times daily.     diclofenac 75 MG EC tablet  Commonly known as:  VOLTAREN  Take 1 tablet (75 mg total) by mouth 2 (two) times daily.     diltiazem 240 MG 24 hr capsule  Commonly known as:  DILACOR XR  Take 240 mg by mouth daily.      HYDROcodone-acetaminophen 5-325 MG per tablet  Commonly known as:  NORCO/VICODIN  Take 1 tablet by mouth every 6 (six) hours as needed for pain.     lisinopril 20 MG tablet  Commonly known as:  PRINIVIL,ZESTRIL  Take 20 mg by mouth daily.     omeprazole 20 MG tablet  Commonly known as:  PRILOSEC OTC  Take 2 tablets (40 mg total) by mouth daily.     simvastatin 20 MG tablet  Commonly known as:  ZOCOR  Take 20 mg by mouth every evening.       Allergies  Allergen Reactions  . Percodan (Oxycodone-Aspirin) Rash    Percodan caused rash---but he can tolerate Percocet and aspirin on their own        Follow-up Information   Follow up with Aaron Boga, MD. (as scheduled and as needed)    Contact information:   [address] [place] [facility] [place] [address] [phone]        The results of significant diagnostics from this hospitalization (including imaging, microbiology, ancillary and laboratory) are listed below for reference.    Significant Diagnostic Studies: Dg Ribs Unilateral W/chest Left  02/26/2013   *RADIOLOGY REPORT*  Clinical Data: Left chest pain.  LEFT RIBS AND CHEST - 3+ VIEW  Comparison: PA and lateral chest 09/14/2012.  Findings: Lungs are clear.  Heart size is normal.  No pneumothorax or pleural fluid.  No rib fracture is identified.  IMPRESSION: Negative exam.   Original Report Authenticated By: Aaron Black, M.D.   Nm Myocar Multi W/spect W/wall Motion / Ef  02/27/2013   Patient underwent Lexiscan Myoview stress testing under the supervision of the [facility] cardiology staff. The patient did not experience any chest discomfort with stress. EKG shows no ischemic changes during or after stress.  Quality of the images is satisfactory.  Perfusion imaging shows no evidence of ischemia or infarction.  The EDV is 71ml.  The ESV is 27 ml. The EF is 62%. There are no segmental wall motion abnormalities.  Impression: Normal Lexiscan myoview stress test. No  ischemia. Normal LV systolic function.  Aaron Nanas MD   Original Report Authenticated By: Aaron Black, M.D.    Microbiology: No results found for this or any previous visit (from the past 240 hour(s)).   Labs: Basic Metabolic Panel:  Recent Labs Lab 02/26/13 0135 02/27/13 0502  NA 135 139  K 3.9 4.0  CL 100 105  CO2 21 25  GLUCOSE 91 109*  BUN 15 10  CREATININE 1.06 1.13  CALCIUM 9.4 9.5   Liver Function Tests: No results found for this basename: AST, ALT, ALKPHOS, BILITOT, PROT, ALBUMIN,  in the last 168 hours No results found for this basename: LIPASE, AMYLASE,  in the last 168 hours No results  found for this basename: AMMONIA,  in the last 168 hours CBC:  Recent Labs Lab 02/26/13 0135  WBC 5.6  HGB 13.6  HCT 37.7*  MCV 91.5  PLT 258   Cardiac Enzymes:  Recent Labs Lab 02/26/13 0914 02/26/13 1420 02/26/13 2210  TROPONINI <0.30 <0.30 <0.30   BNP: BNP (last 3 results)  Recent Labs  09/14/12 1058  PROBNP 20.2   CBG: No results found for this basename: GLUCAP,  in the last 168 hours     Signed:  Samani Deal C  [facility] 02/27/2013, 4:31 PM

## 2013-02-28 NOTE — ED Provider Notes (Signed)
Medical screening examination/treatment/procedure(s) were conducted as a shared visit with non-physician practitioner(s) and myself.  I personally evaluated the patient during the encounter.  57yo M, c/o left sided chest pressure for the past 3 hours. Assoc with SOB, rad into left upper arm. Endorses he has been experiencing same each morning upon awakening, lasting approx 5-10 min before resolving spontaneously, for the past 1 week. Today's symptoms more severe. States his 59yo brother died suddenly of MI 4 weeks ago.  Pt unclear regarding his own cardiac hx. VSS, resps easy. EKG without acute STTW changes, troponin negative. Symptoms improved after ntg (pt states he is allergic to ASA). Will observation admit.    Laray Anger, DO 02/28/13 [place]

## 2013-03-07 ENCOUNTER — Encounter: Payer: Self-pay | Admitting: Internal Medicine

## 2013-03-07 ENCOUNTER — Ambulatory Visit (INDEPENDENT_AMBULATORY_CARE_PROVIDER_SITE_OTHER): Payer: Managed Care, Other (non HMO) | Admitting: Internal Medicine

## 2013-03-07 VITALS — BP 144/90 | HR 79 | Temp 97.4°F | Resp 20 | Wt 166.0 lb

## 2013-03-07 DIAGNOSIS — E785 Hyperlipidemia, unspecified: Secondary | ICD-10-CM

## 2013-03-07 DIAGNOSIS — I10 Essential (primary) hypertension: Secondary | ICD-10-CM

## 2013-03-07 DIAGNOSIS — F411 Generalized anxiety disorder: Secondary | ICD-10-CM

## 2013-03-07 MED ORDER — SERTRALINE HCL 50 MG PO TABS: 50.0000 mg | ORAL_TABLET | Freq: Every day | ORAL | Status: DC

## 2013-03-07 NOTE — Progress Notes (Signed)
Subjective:    Patient ID: Aaron Black, male    DOB: March 23, 1956, 57 y.o.   MRN: [id number]  HPI  57 year old patient who is seen today for followup. He is followed by neurosurgery do to the back and right leg pain. He is scheduled for an epidural later this morning. He was hospitalized about 9 days ago for chest pain with a negative cardiac evaluation. His omeprazole has been increased to a twice a day regimen. He has been under considerable situational stress due to to the poor health of his father and recent death of his brother. He complains of increasing anxiety and depression. He has been on Cymbalta by his rheumatologist in the past which he is unable to afford  Past Medical History  Diagnosis Date  . Diverticulosis of colon   . GERD (gastroesophageal reflux disease)   . Cervical disc disease     a. 07/2003 ant cervical deomcpression and fusion C5-6/C6-7;  b. 09/2007 post cervical laminectomy C4-5 with scres and arthrodesis C4-C7.  Marland Kitchen Sleep apnea   . Arthritis   . Anxiety   . Hypertension   . Colon polyps   . B12 deficiency   . Anemia   . Hyperlipidemia   . Gout   . Pleurisy   . Tobacco abuse     a. 40 yrs, 1.5-3 ppd over that time.  . Chronic lower back pain   . Complication of anesthesia     " i WOKE UP DURING A COLONOSCOPY "  . Chest pain at rest 02/25/2013    History   Social History  . Marital Status: Married    Spouse Name: N/A    Number of Children: N/A  . Years of Education: N/A   Occupational History  . Not on file.   Social History Main Topics  . Smoking status: Current Every Day Smoker -- 2.00 packs/day for 40 years  . Smokeless tobacco: Never Used     Comment: Currently smoking 1.5 ppd.  Has previously smoked up to 3 ppd and did so for about 20 yrs.  . Alcohol Use: Yes     Comment: at least 3 beers/night.  . Drug Use: No  . Sexual Activity: Not on file   Other Topics Concern  . Not on file   Social History Narrative   Lives in [place] with wife.   Does not work.  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    Past Surgical History  Procedure Laterality Date  . Back surgery      lower  . Rotator cuff repair  August 2000, January 2002, July 2008, July 2009  . Neck surgery  January 2005, March 2009    C-Spine    Family History  Problem Relation Age of Onset  . Bone cancer Mother   . Colon polyps Mother   . Squamous cell carcinoma Mother     died @ 89  . Hypertension Father   . Colon polyps Father   . Diabetes Father     borderline  . Congestive Heart Failure Father     alive @ 30  . Sudden death Brother     died @ 53  . Other Brother     died in MVA @ 64 - struck by drunk driver  . Other Brother     died in MVA @ 21 - struck by drunk driver    Allergies  Allergen Reactions  . Percodan [Oxycodone-Aspirin] Rash    Percodan caused rash---but  he can tolerate Percocet and aspirin on their own     Current Outpatient Prescriptions on File Prior to Visit  Medication Sig Dispense Refill  . ALPRAZolam (XANAX) 0.5 MG tablet Take 0.5 mg by mouth 3 (three) times daily as needed for sleep or anxiety.      . cyanocobalamin (,VITAMIN B-12,) 1000 MCG/ML injection Inject 1 mL (1,000 mcg total) into the muscle every 30 (thirty) days.  10 mL  2  . cyclobenzaprine (FLEXERIL) 10 MG tablet Take 10 mg by mouth 2 (two) times daily.       . diclofenac (VOLTAREN) 75 MG EC tablet Take 1 tablet (75 mg total) by mouth 2 (two) times daily.  180 tablet  6  . diltiazem (DILACOR XR) 240 MG 24 hr capsule Take 240 mg by mouth daily.      Marland Kitchen HYDROcodone-acetaminophen (NORCO/VICODIN) 5-325 MG per tablet Take 1 tablet by mouth every 6 (six) hours as needed for pain.      Marland Kitchen lisinopril (PRINIVIL,ZESTRIL) 20 MG tablet Take 20 mg by mouth daily.      Marland Kitchen omeprazole (PRILOSEC OTC) 20 MG tablet Take 2 tablets (40 mg total) by mouth daily.      . simvastatin (ZOCOR) 20 MG tablet Take 20 mg by mouth every evening.      . Syringe/Needle, Disp, (B-D ECLIPSE  SYRINGE) 30G X 1/2" 1 ML MISC every 30 (thirty) days.         No current facility-administered medications on file prior to visit.    BP 144/90  Pulse 79  Temp(Src) 97.4 F (36.3 C) (Oral)  Resp 20  Wt 166 lb (75.297 kg)  BMI 25.99 kg/m2  SpO2 99%       Review of Systems  Constitutional: Negative for fever, chills, appetite change and fatigue.  HENT: Negative for hearing loss, ear pain, congestion, sore throat, trouble swallowing, neck stiffness, dental problem, voice change and tinnitus.   Eyes: Negative for pain, discharge and visual disturbance.  Respiratory: Negative for cough, chest tightness, wheezing and stridor.   Cardiovascular: Negative for chest pain, palpitations and leg swelling.  Gastrointestinal: Negative for nausea, vomiting, abdominal pain, diarrhea, constipation, blood in stool and abdominal distention.  Genitourinary: Negative for urgency, hematuria, flank pain, discharge, difficulty urinating and genital sores.  Musculoskeletal: Negative for myalgias, back pain, joint swelling, arthralgias and gait problem.  Skin: Negative for rash.  Neurological: Negative for dizziness, syncope, speech difficulty, weakness, numbness and headaches.  Hematological: Negative for adenopathy. Does not bruise/bleed easily.  Psychiatric/Behavioral: Positive for dysphoric mood. Negative for behavioral problems. The patient is nervous/anxious.        Objective:   Physical Exam  Constitutional: He is oriented to person, place, and time. He appears well-developed.  HENT:  Head: Normocephalic.  Right Ear: External ear normal.  Left Ear: External ear normal.  Eyes: Conjunctivae and EOM are normal.  Neck: Normal range of motion.  Cardiovascular: Normal rate and normal heart sounds.   Pulmonary/Chest: Breath sounds normal.  Abdominal: Bowel sounds are normal.  Musculoskeletal: Normal range of motion. He exhibits no edema and no tenderness.  Neurological: He is alert and oriented  to person, place, and time.  Psychiatric: He has a normal mood and affect. His behavior is normal.          Assessment & Plan:   Hypertension History of atypical chest pain GERD Anxiety disorder. Will give a trial of sertraline 50 mg daily. We'll recheck in 6 weeks Followup rheumatology and neurosurgery

## 2013-03-07 NOTE — Patient Instructions (Signed)
Return in 6 weeks for follow up

## 2013-06-04 ENCOUNTER — Other Ambulatory Visit: Payer: Self-pay | Admitting: *Deleted

## 2013-06-04 ENCOUNTER — Ambulatory Visit (INDEPENDENT_AMBULATORY_CARE_PROVIDER_SITE_OTHER): Payer: Managed Care, Other (non HMO)

## 2013-06-04 ENCOUNTER — Other Ambulatory Visit: Payer: Self-pay | Admitting: Internal Medicine

## 2013-06-04 DIAGNOSIS — Z23 Encounter for immunization: Secondary | ICD-10-CM

## 2013-06-04 MED ORDER — ALPRAZOLAM 0.5 MG PO TABS: 0.5000 mg | ORAL_TABLET | Freq: Three times a day (TID) | ORAL | Status: DC | PRN

## 2013-06-18 ENCOUNTER — Other Ambulatory Visit: Payer: Self-pay | Admitting: Internal Medicine

## 2013-06-26 ENCOUNTER — Telehealth: Payer: Self-pay | Admitting: Internal Medicine

## 2013-06-26 NOTE — Telephone Encounter (Signed)
Your pt would like for you see his friend Earley Abide [phone]. Pt has medicare

## 2013-06-26 NOTE — Telephone Encounter (Signed)
ok 

## 2013-06-30 NOTE — Telephone Encounter (Signed)
Pt has been sch

## 2013-07-03 ENCOUNTER — Ambulatory Visit
Admission: RE | Admit: 2013-07-03 | Discharge: 2013-07-03 | Disposition: A | Discharge status: Home / Self Care | Payer: Managed Care, Other (non HMO) | Source: Ambulatory Visit | Attending: Neurosurgery | Admitting: Neurosurgery

## 2013-07-03 ENCOUNTER — Other Ambulatory Visit: Payer: Self-pay | Admitting: Neurosurgery

## 2013-07-03 DIAGNOSIS — R29898 Other symptoms and signs involving the musculoskeletal system: Secondary | ICD-10-CM

## 2013-07-03 DIAGNOSIS — M545 Low back pain, unspecified: Secondary | ICD-10-CM

## 2013-07-07 ENCOUNTER — Other Ambulatory Visit: Payer: Self-pay | Admitting: Neurosurgery

## 2013-07-07 DIAGNOSIS — M5417 Radiculopathy, lumbosacral region: Secondary | ICD-10-CM | POA: Insufficient documentation

## 2013-07-07 DIAGNOSIS — M47816 Spondylosis without myelopathy or radiculopathy, lumbar region: Secondary | ICD-10-CM | POA: Diagnosis present

## 2013-07-07 DIAGNOSIS — M5127 Other intervertebral disc displacement, lumbosacral region: Secondary | ICD-10-CM | POA: Diagnosis present

## 2013-07-08 ENCOUNTER — Encounter (HOSPITAL_COMMUNITY): Payer: Self-pay | Admitting: Pharmacy Technician

## 2013-07-09 NOTE — Pre-Procedure Instructions (Signed)
Aaron Black  07/09/2013   Your procedure is scheduled on:  Friday July 11, 2013 @ 10:45 am.  Report to [facility] Short Stay Entrance "A"  Admitting at 7:30 AM.  Call this number if you have problems the morning of surgery: [phone]   Remember:   Do not eat food or drink liquids after midnight.   Take these medicines the morning of surgery with A SIP OF WATER: Alprazolam (Xanax) if needed for anxiety, Diltiazem (Cardizem), Hydrocodone if needed for pain, Omeprazole (Prilosec), Sertraline (Zoloft)   Discontinue aspirin and herbal medications 7 days prior to surgery   Do not wear jewelry.  Do not wear lotions, powders, or colognes. You may wear deodorant.  Men may shave face and neck.  Do not bring valuables to the hospital.  [facility] is not responsible for any belongings or valuables.               Contacts, dentures or bridgework may not be worn into surgery.  Leave suitcase in the car. After surgery it may be brought to your room.  For patients admitted to the hospital, discharge time is determined by your treatment team.               Patients discharged the day of surgery will not be allowed to drive home.  Name and phone number of your driver: Family/Friend  Special Instructions: Shower using CHG 2 nights before surgery and the night before surgery.  If you shower the day of surgery use CHG.  Use special wash - you have one bottle of CHG for all showers.  You should use approximately 1/3 of the bottle for each shower.   Please read over the following fact sheets that you were given: Pain Booklet, Coughing and Deep Breathing, MRSA Information and Surgical Site Infection Prevention

## 2013-07-10 ENCOUNTER — Encounter (HOSPITAL_COMMUNITY)
Admission: RE | Admit: 2013-07-10 | Discharge: 2013-07-10 | Disposition: A | Discharge status: Home / Self Care | Payer: Managed Care, Other (non HMO) | Source: Ambulatory Visit | Attending: Neurosurgery | Admitting: Neurosurgery

## 2013-07-10 ENCOUNTER — Encounter (HOSPITAL_COMMUNITY): Payer: Self-pay

## 2013-07-10 HISTORY — DX: Major depressive disorder, single episode, unspecified: F32.9

## 2013-07-10 HISTORY — DX: Depression, unspecified: F32.A

## 2013-07-10 LAB — CBC
HCT: 35.2 % — ABNORMAL LOW (ref 39.0–52.0)
Hemoglobin: 11.9 g/dL — ABNORMAL LOW (ref 13.0–17.0)
MCV: 93.6 fL (ref 78.0–100.0)
RBC: 3.76 MIL/uL — ABNORMAL LOW (ref 4.22–5.81)
WBC: 7.1 10*3/uL (ref 4.0–10.5)

## 2013-07-10 LAB — BASIC METABOLIC PANEL
CO2: 23 mEq/L (ref 19–32)
Chloride: 98 mEq/L (ref 96–112)
Sodium: 134 mEq/L — ABNORMAL LOW (ref 135–145)

## 2013-07-10 LAB — SURGICAL PCR SCREEN: Staphylococcus aureus: NEGATIVE

## 2013-07-10 MED ORDER — CEFAZOLIN SODIUM-DEXTROSE 2-3 GM-% IV SOLR
2.0000 g | INTRAVENOUS | Status: AC
  Administered 2013-07-11: 2 g via INTRAVENOUS
  Filled 2013-07-10: qty 50

## 2013-07-10 NOTE — Progress Notes (Signed)
07/10/13 0942  OBSTRUCTIVE SLEEP APNEA  Score 4 or greater  Results sent to PCP   This patient has screened at risk for sleep apnea using the STOP Bang tool during a pre-surgical visit. A score of 4 or greater is at risk for sleep apnea.

## 2013-07-11 ENCOUNTER — Encounter (HOSPITAL_COMMUNITY): Payer: Managed Care, Other (non HMO) | Admitting: Anesthesiology

## 2013-07-11 ENCOUNTER — Encounter (HOSPITAL_COMMUNITY)
Admission: RE | Disposition: A | Discharge status: Home / Self Care | Payer: Self-pay | Source: Ambulatory Visit | Attending: Neurosurgery

## 2013-07-11 ENCOUNTER — Observation Stay (HOSPITAL_COMMUNITY)
Admission: RE | Admit: 2013-07-11 | Discharge: 2013-07-11 | Disposition: A | Discharge status: Home / Self Care | Payer: Managed Care, Other (non HMO) | Source: Ambulatory Visit | Attending: Neurosurgery | Admitting: Neurosurgery

## 2013-07-11 ENCOUNTER — Encounter (HOSPITAL_COMMUNITY): Payer: Self-pay | Admitting: Anesthesiology

## 2013-07-11 ENCOUNTER — Ambulatory Visit (HOSPITAL_COMMUNITY): Payer: Managed Care, Other (non HMO) | Admitting: Anesthesiology

## 2013-07-11 ENCOUNTER — Ambulatory Visit (HOSPITAL_COMMUNITY): Payer: Managed Care, Other (non HMO)

## 2013-07-11 DIAGNOSIS — M47817 Spondylosis without myelopathy or radiculopathy, lumbosacral region: Secondary | ICD-10-CM | POA: Insufficient documentation

## 2013-07-11 DIAGNOSIS — G4730 Sleep apnea, unspecified: Secondary | ICD-10-CM | POA: Insufficient documentation

## 2013-07-11 DIAGNOSIS — I10 Essential (primary) hypertension: Secondary | ICD-10-CM | POA: Insufficient documentation

## 2013-07-11 DIAGNOSIS — M5126 Other intervertebral disc displacement, lumbar region: Principal | ICD-10-CM | POA: Insufficient documentation

## 2013-07-11 DIAGNOSIS — Z01812 Encounter for preprocedural laboratory examination: Secondary | ICD-10-CM | POA: Insufficient documentation

## 2013-07-11 DIAGNOSIS — M51379 Other intervertebral disc degeneration, lumbosacral region without mention of lumbar back pain or lower extremity pain: Secondary | ICD-10-CM | POA: Insufficient documentation

## 2013-07-11 DIAGNOSIS — Z87891 Personal history of nicotine dependence: Secondary | ICD-10-CM | POA: Insufficient documentation

## 2013-07-11 DIAGNOSIS — M5137 Other intervertebral disc degeneration, lumbosacral region: Secondary | ICD-10-CM | POA: Insufficient documentation

## 2013-07-11 DIAGNOSIS — M5127 Other intervertebral disc displacement, lumbosacral region: Secondary | ICD-10-CM | POA: Diagnosis present

## 2013-07-11 HISTORY — PX: LUMBAR LAMINECTOMY/DECOMPRESSION MICRODISCECTOMY: SHX5026

## 2013-07-11 SURGERY — LUMBAR LAMINECTOMY/DECOMPRESSION MICRODISCECTOMY 1 LEVEL
Anesthesia: General | Laterality: Right

## 2013-07-11 MED ORDER — DICLOFENAC SODIUM 75 MG PO TBEC
75.0000 mg | DELAYED_RELEASE_TABLET | Freq: Two times a day (BID) | ORAL | Status: DC
  Filled 2013-07-11 (×2): qty 1

## 2013-07-11 MED ORDER — ACETAMINOPHEN 325 MG PO TABS: 650.0000 mg | ORAL_TABLET | ORAL | Status: DC | PRN

## 2013-07-11 MED ORDER — FLEET ENEMA 7-19 GM/118ML RE ENEM
1.0000 | ENEMA | Freq: Once | RECTAL | Status: DC | PRN
  Filled 2013-07-11: qty 1

## 2013-07-11 MED ORDER — CEFAZOLIN SODIUM 1-5 GM-% IV SOLN
1.0000 g | Freq: Three times a day (TID) | INTRAVENOUS | Status: DC
  Filled 2013-07-11 (×2): qty 50

## 2013-07-11 MED ORDER — FENTANYL CITRATE 0.05 MG/ML IJ SOLN
INTRAMUSCULAR | Status: DC | PRN
  Administered 2013-07-11 (×5): 50 ug via INTRAVENOUS

## 2013-07-11 MED ORDER — DILTIAZEM HCL ER 240 MG PO CP24
240.0000 mg | ORAL_CAPSULE | Freq: Every day | ORAL | Status: DC
  Filled 2013-07-11: qty 1

## 2013-07-11 MED ORDER — MORPHINE SULFATE 2 MG/ML IJ SOLN
1.0000 mg | INTRAMUSCULAR | Status: DC | PRN
  Administered 2013-07-11: 2 mg via INTRAVENOUS
  Filled 2013-07-11: qty 1

## 2013-07-11 MED ORDER — NEOSTIGMINE METHYLSULFATE 1 MG/ML IJ SOLN
INTRAMUSCULAR | Status: DC | PRN
  Administered 2013-07-11: 5 mg via INTRAVENOUS

## 2013-07-11 MED ORDER — SODIUM CHLORIDE 0.9 % IV SOLN
INTRAVENOUS | Status: DC
  Administered 2013-07-11: 08:00:00 via INTRAVENOUS

## 2013-07-11 MED ORDER — BISACODYL 10 MG RE SUPP: 10.0000 mg | Freq: Every day | RECTAL | Status: DC | PRN

## 2013-07-11 MED ORDER — HYDROCODONE-ACETAMINOPHEN 5-325 MG PO TABS: 1.0000 | ORAL_TABLET | ORAL | Status: DC | PRN

## 2013-07-11 MED ORDER — GLYCOPYRROLATE 0.2 MG/ML IJ SOLN
INTRAMUSCULAR | Status: DC | PRN
  Administered 2013-07-11: .8 mg via INTRAVENOUS

## 2013-07-11 MED ORDER — ACETAMINOPHEN 650 MG RE SUPP: 650.0000 mg | RECTAL | Status: DC | PRN

## 2013-07-11 MED ORDER — SENNA 8.6 MG PO TABS: 1.0000 | ORAL_TABLET | Freq: Two times a day (BID) | ORAL | Status: DC

## 2013-07-11 MED ORDER — EPHEDRINE SULFATE 50 MG/ML IJ SOLN
INTRAMUSCULAR | Status: DC | PRN
  Administered 2013-07-11 (×2): 10 mg via INTRAVENOUS

## 2013-07-11 MED ORDER — ZOLPIDEM TARTRATE 5 MG PO TABS: 5.0000 mg | ORAL_TABLET | Freq: Every evening | ORAL | Status: DC | PRN

## 2013-07-11 MED ORDER — LISINOPRIL 20 MG PO TABS: 20.0000 mg | ORAL_TABLET | Freq: Every day | ORAL | Status: DC

## 2013-07-11 MED ORDER — HYDROCODONE-ACETAMINOPHEN 5-325 MG PO TABS: 1.0000 | ORAL_TABLET | Freq: Four times a day (QID) | ORAL | Status: DC | PRN

## 2013-07-11 MED ORDER — HEMOSTATIC AGENTS (NO CHARGE) OPTIME
TOPICAL | Status: DC | PRN
  Administered 2013-07-11: 1 via TOPICAL

## 2013-07-11 MED ORDER — ARTIFICIAL TEARS OP OINT
TOPICAL_OINTMENT | OPHTHALMIC | Status: DC | PRN
  Administered 2013-07-11: 1 via OPHTHALMIC

## 2013-07-11 MED ORDER — BUPIVACAINE HCL (PF) 0.5 % IJ SOLN
INTRAMUSCULAR | Status: DC | PRN
  Administered 2013-07-11: 2.5 mL

## 2013-07-11 MED ORDER — LIDOCAINE-EPINEPHRINE 1 %-1:100000 IJ SOLN
INTRAMUSCULAR | Status: DC | PRN
  Administered 2013-07-11: 2.5 mL

## 2013-07-11 MED ORDER — HYDROMORPHONE HCL PF 1 MG/ML IJ SOLN
INTRAMUSCULAR | Status: AC
  Administered 2013-07-11: 0.5 mg via INTRAVENOUS
  Filled 2013-07-11: qty 1

## 2013-07-11 MED ORDER — THROMBIN 5000 UNITS EX SOLR
CUTANEOUS | Status: DC | PRN
  Administered 2013-07-11: 10000 [IU] via TOPICAL

## 2013-07-11 MED ORDER — SIMVASTATIN 20 MG PO TABS: 20.0000 mg | ORAL_TABLET | Freq: Every evening | ORAL | Status: DC

## 2013-07-11 MED ORDER — SODIUM CHLORIDE 0.9 % IJ SOLN: 3.0000 mL | INTRAMUSCULAR | Status: DC | PRN

## 2013-07-11 MED ORDER — OXYCODONE HCL 5 MG/5ML PO SOLN: 5.0000 mg | Freq: Once | ORAL | Status: DC | PRN

## 2013-07-11 MED ORDER — SERTRALINE HCL 50 MG PO TABS
50.0000 mg | ORAL_TABLET | Freq: Every day | ORAL | Status: DC
  Filled 2013-07-11: qty 1

## 2013-07-11 MED ORDER — PANTOPRAZOLE SODIUM 40 MG IV SOLR
40.0000 mg | Freq: Every day | INTRAVENOUS | Status: DC
  Filled 2013-07-11: qty 40

## 2013-07-11 MED ORDER — LACTATED RINGERS IV SOLN
INTRAVENOUS | Status: DC | PRN
  Administered 2013-07-11 (×2): via INTRAVENOUS

## 2013-07-11 MED ORDER — MIDAZOLAM HCL 5 MG/5ML IJ SOLN
INTRAMUSCULAR | Status: DC | PRN
  Administered 2013-07-11: 2 mg via INTRAVENOUS

## 2013-07-11 MED ORDER — OMEPRAZOLE MAGNESIUM 20 MG PO TBEC: 40.0000 mg | DELAYED_RELEASE_TABLET | Freq: Every day | ORAL | Status: DC

## 2013-07-11 MED ORDER — DIAZEPAM 5 MG PO TABS: 5.0000 mg | ORAL_TABLET | Freq: Four times a day (QID) | ORAL | Status: DC | PRN

## 2013-07-11 MED ORDER — ALPRAZOLAM 0.5 MG PO TABS: 0.5000 mg | ORAL_TABLET | Freq: Three times a day (TID) | ORAL | Status: DC | PRN

## 2013-07-11 MED ORDER — OXYCODONE HCL 5 MG PO TABS: 5.0000 mg | ORAL_TABLET | Freq: Once | ORAL | Status: DC | PRN

## 2013-07-11 MED ORDER — ONDANSETRON HCL 4 MG/2ML IJ SOLN
INTRAMUSCULAR | Status: DC | PRN
  Administered 2013-07-11: 4 mg via INTRAVENOUS

## 2013-07-11 MED ORDER — ROCURONIUM BROMIDE 100 MG/10ML IV SOLN
INTRAVENOUS | Status: DC | PRN
  Administered 2013-07-11: 50 mg via INTRAVENOUS

## 2013-07-11 MED ORDER — ONDANSETRON HCL 4 MG/2ML IJ SOLN: 4.0000 mg | Freq: Four times a day (QID) | INTRAMUSCULAR | Status: DC | PRN

## 2013-07-11 MED ORDER — SODIUM CHLORIDE 0.9 % IJ SOLN: 3.0000 mL | Freq: Two times a day (BID) | INTRAMUSCULAR | Status: DC

## 2013-07-11 MED ORDER — ALUM & MAG HYDROXIDE-SIMETH 200-200-20 MG/5ML PO SUSP: 30.0000 mL | Freq: Four times a day (QID) | ORAL | Status: DC | PRN

## 2013-07-11 MED ORDER — ONDANSETRON HCL 4 MG/2ML IJ SOLN: 4.0000 mg | INTRAMUSCULAR | Status: DC | PRN

## 2013-07-11 MED ORDER — KCL IN DEXTROSE-NACL 20-5-0.45 MEQ/L-%-% IV SOLN
INTRAVENOUS | Status: DC
  Filled 2013-07-11 (×2): qty 1000

## 2013-07-11 MED ORDER — DOCUSATE SODIUM 100 MG PO CAPS
100.0000 mg | ORAL_CAPSULE | Freq: Two times a day (BID) | ORAL | Status: DC
  Filled 2013-07-11 (×2): qty 1

## 2013-07-11 MED ORDER — CYCLOBENZAPRINE HCL 10 MG PO TABS: 10.0000 mg | ORAL_TABLET | Freq: Two times a day (BID) | ORAL | Status: DC

## 2013-07-11 MED ORDER — PROPOFOL 10 MG/ML IV BOLUS
INTRAVENOUS | Status: DC | PRN
  Administered 2013-07-11: 180 mg via INTRAVENOUS

## 2013-07-11 MED ORDER — LIDOCAINE HCL (CARDIAC) 20 MG/ML IV SOLN
INTRAVENOUS | Status: DC | PRN
  Administered 2013-07-11: 100 mg via INTRAVENOUS

## 2013-07-11 MED ORDER — POLYETHYLENE GLYCOL 3350 17 G PO PACK
17.0000 g | PACK | Freq: Every day | ORAL | Status: DC | PRN
  Filled 2013-07-11: qty 1

## 2013-07-11 MED ORDER — HYDROMORPHONE HCL PF 1 MG/ML IJ SOLN
0.2500 mg | INTRAMUSCULAR | Status: DC | PRN
  Administered 2013-07-11 (×2): 0.5 mg via INTRAVENOUS

## 2013-07-11 MED ORDER — MENTHOL 3 MG MT LOZG: 1.0000 | LOZENGE | OROMUCOSAL | Status: DC | PRN

## 2013-07-11 MED ORDER — CYANOCOBALAMIN 1000 MCG/ML IJ SOLN
1000.0000 ug | INTRAMUSCULAR | Status: DC
  Filled 2013-07-11: qty 1

## 2013-07-11 MED ORDER — PHENOL 1.4 % MT LIQD: 1.0000 | OROMUCOSAL | Status: DC | PRN

## 2013-07-11 SURGICAL SUPPLY — 58 items
ADH SKN CLS APL DERMABOND .7 (GAUZE/BANDAGES/DRESSINGS) ×1
APL SKNCLS STERI-STRIP NONHPOA (GAUZE/BANDAGES/DRESSINGS)
BAG DECANTER FOR FLEXI CONT (MISCELLANEOUS) ×2 IMPLANT
BENZOIN TINCTURE PRP APPL 2/3 (GAUZE/BANDAGES/DRESSINGS) IMPLANT
BIT DRILL NEURO 2X3.1 SFT TUCH (MISCELLANEOUS) ×1 IMPLANT
BLADE SURG ROTATE 9660 (MISCELLANEOUS) IMPLANT
BUR ROUND FLUTED 5 RND (BURR) ×2 IMPLANT
CANISTER SUCT 3000ML (MISCELLANEOUS) ×2 IMPLANT
CONT SPEC 4OZ CLIKSEAL STRL BL (MISCELLANEOUS) ×2 IMPLANT
DERMABOND ADVANCED (GAUZE/BANDAGES/DRESSINGS) ×1
DERMABOND ADVANCED .7 DNX12 (GAUZE/BANDAGES/DRESSINGS) ×1 IMPLANT
DRAPE LAPAROTOMY 100X72X124 (DRAPES) ×2 IMPLANT
DRAPE MICROSCOPE LEICA (MISCELLANEOUS) ×2 IMPLANT
DRAPE POUCH INSTRU U-SHP 10X18 (DRAPES) ×2 IMPLANT
DRAPE SURG 17X23 STRL (DRAPES) ×2 IMPLANT
DRESSING TELFA 8X3 (GAUZE/BANDAGES/DRESSINGS) IMPLANT
DRILL NEURO 2X3.1 SOFT TOUCH (MISCELLANEOUS) ×2
DRSG OPSITE POSTOP 3X4 (GAUZE/BANDAGES/DRESSINGS) ×1 IMPLANT
DURAFORM COLLAGEN 1X1 5-PACK (Neuro Prosthesis/Implant) ×1 IMPLANT
DURAPREP 26ML APPLICATOR (WOUND CARE) ×2 IMPLANT
ELECT REM PT RETURN 9FT ADLT (ELECTROSURGICAL) ×2
ELECTRODE REM PT RTRN 9FT ADLT (ELECTROSURGICAL) ×1 IMPLANT
GAUZE SPONGE 4X4 16PLY XRAY LF (GAUZE/BANDAGES/DRESSINGS) IMPLANT
GLOVE BIO SURGEON STRL SZ8 (GLOVE) ×2 IMPLANT
GLOVE BIOGEL PI IND STRL 8 (GLOVE) ×1 IMPLANT
GLOVE BIOGEL PI IND STRL 8.5 (GLOVE) ×1 IMPLANT
GLOVE BIOGEL PI INDICATOR 8 (GLOVE) ×1
GLOVE BIOGEL PI INDICATOR 8.5 (GLOVE) ×1
GLOVE ECLIPSE 8.0 STRL XLNG CF (GLOVE) ×2 IMPLANT
GLOVE EXAM NITRILE LRG STRL (GLOVE) IMPLANT
GLOVE EXAM NITRILE MD LF STRL (GLOVE) IMPLANT
GLOVE EXAM NITRILE XL STR (GLOVE) IMPLANT
GLOVE EXAM NITRILE XS STR PU (GLOVE) IMPLANT
GOWN BRE IMP SLV AUR LG STRL (GOWN DISPOSABLE) IMPLANT
GOWN BRE IMP SLV AUR XL STRL (GOWN DISPOSABLE) ×2 IMPLANT
GOWN STRL REIN 2XL LVL4 (GOWN DISPOSABLE) ×2 IMPLANT
KIT BASIN OR (CUSTOM PROCEDURE TRAY) ×2 IMPLANT
KIT ROOM TURNOVER OR (KITS) ×2 IMPLANT
NDL HYPO 18GX1.5 BLUNT FILL (NEEDLE) IMPLANT
NDL HYPO 25X1 1.5 SAFETY (NEEDLE) ×1 IMPLANT
NEEDLE HYPO 18GX1.5 BLUNT FILL (NEEDLE) IMPLANT
NEEDLE HYPO 25X1 1.5 SAFETY (NEEDLE) ×2 IMPLANT
NS IRRIG 1000ML POUR BTL (IV SOLUTION) ×2 IMPLANT
PACK LAMINECTOMY NEURO (CUSTOM PROCEDURE TRAY) ×2 IMPLANT
PAD ARMBOARD 7.5X6 YLW CONV (MISCELLANEOUS) ×6 IMPLANT
RUBBERBAND STERILE (MISCELLANEOUS) ×4 IMPLANT
SPONGE GAUZE 4X4 12PLY (GAUZE/BANDAGES/DRESSINGS) IMPLANT
SPONGE SURGIFOAM ABS GEL SZ50 (HEMOSTASIS) ×2 IMPLANT
STRIP CLOSURE SKIN 1/2X4 (GAUZE/BANDAGES/DRESSINGS) IMPLANT
SUT VIC AB 0 CT1 18XCR BRD8 (SUTURE) ×1 IMPLANT
SUT VIC AB 0 CT1 8-18 (SUTURE) ×2
SUT VIC AB 2-0 CT1 18 (SUTURE) ×2 IMPLANT
SUT VIC AB 3-0 SH 8-18 (SUTURE) ×2 IMPLANT
SYR 20ML ECCENTRIC (SYRINGE) ×2 IMPLANT
SYR 5ML LL (SYRINGE) IMPLANT
TOWEL OR 17X24 6PK STRL BLUE (TOWEL DISPOSABLE) ×2 IMPLANT
TOWEL OR 17X26 10 PK STRL BLUE (TOWEL DISPOSABLE) ×2 IMPLANT
WATER STERILE IRR 1000ML POUR (IV SOLUTION) ×2 IMPLANT

## 2013-07-11 NOTE — Brief Op Note (Signed)
07/11/2013  12:25 PM  PATIENT:  Aaron Black  57 y.o. male  PRE-OPERATIVE DIAGNOSIS:  lumbar herniated disc L 34 Right with spondylosis, degenerative disc disease, radiculopathy  POST-OPERATIVE DIAGNOSIS: lumbar herniated disc L 34 Right with spondylosis, degenerative disc disease, radiculopathy  PROCEDURE:  Procedure(s) with comments: LUMBAR LAMINECTOMY/DECOMPRESSION MICRODISCECTOMY 1 LEVEL (Right) - Right L34 microdiskectomy  with microdissection  SURGEON:  Surgeon(s) and Role:    * Maeola Harman, MD - Primary  PHYSICIAN ASSISTANT: Wynetta Emery, MD  ASSISTANTS: Poteat, RN   ANESTHESIA:   general  EBL:  Total I/O In: 1500 [I.V.:1500] Out: -   BLOOD ADMINISTERED:none  DRAINS: none   LOCAL MEDICATIONS USED:  LIDOCAINE   SPECIMEN:  No Specimen  DISPOSITION OF SPECIMEN:  N/A  COUNTS:  YES  TOURNIQUET:  * No tourniquets in log *  DICTATION: DICTATION: Patient has a large L 34 disc rupture on the right with significant right leg weakness. It was elected to take him to surgery for right L34 microdiscectomy.  Procedure: Patient was brought to the operating room and following the smooth and uncomplicated induction of general endotracheal anesthesia he was placed in a prone position on the Wilson frame. Low back was prepped and draped in the usual sterile fashion with betadine scrub and DuraPrep. Preop localizing radiograph was obtained.  Area of planned incision was infiltrated with local lidocaine. Incision was made in the midline and carried to the lumbodorsal fascia which was incised on the right side of midline. Subperiosteal dissection was performed exposing what was felt to be L45 level. Intraoperative x-ray demonstrated marker probes at L3-4.  A hemi-semi-laminectomy of L4 was performed a high-speed drill and completed with Kerrison rongeurs and a generous foraminotomy was performed overlying the superior aspect of the L4 lamina. Ligamentum flavum was detached and removed in a  piecemeal fashion and the L4 nerve root was decompressed laterally with removal of the superior aspect of the facet and ligamentum causing nerve root compression. The microscope was brought into the field and the L4 nerve root and thecal sac were mobilized medially. This exposed a large amount of soft disc material and a free fragment of herniated disc material. Multiple fragments were removed cephalad to the interspace  The redundant annulus was also removed with 2 mm Kerrison rongeur. There was evidence on the MRI of cephalad fragments and these were carefully removed and the L3 nerve root was also decompressed. At this point it was felt that all neural elements were well decompressed and there was no evidence of residual loose disc material. The interspace was not opened. Hemostasis was assured with bipolar electrocautery. A tiny opening in the Dura was visualized which was covered with Duragen. The lumbodorsal fascia was closed with 0 Vicryl sutures the subcutaneous tissues reapproximated 2-0 Vicryl inverted sutures and the skin edges were reapproximated with 3-0 Vicryl subcuticular stitch. The wound is dressed with Dermabond. Patient was extubated in the operating room and taken to recovery in stable and satisfactory condition having tolerated his operation well counts were correct at the end of the case.  PLAN OF CARE: Admit for overnight observation  PATIENT DISPOSITION:  PACU - hemodynamically stable.   Delay start of Pharmacological VTE agent (>24hrs) due to surgical blood loss or risk of bleeding: yes

## 2013-07-11 NOTE — H&P (Signed)
> [address] [place], [place] [address] Phone: [phone]   Patient ID:   [id number] Patient: Aaron Black  Date of Birth: 20-Jul-1956 Visit Type: Office Visit   Date: 07/07/2013 02:00 PM Provider: Danae Orleans. Venetia Maxon MD   This 57 year old male presents for Follow Up of back pain.  HISTORY OF PRESENT ILLNESS: 1.  Follow Up of back pain   Aaron Black returns to review MRI.  MRI on [place]  MRI shows a large free fragment disc herniation at L3-4 on the right.  The other levels of his back appeared to have significant spondylosis but these have not changed.  He is complaining that his right leg buckles on him and he does appear to have significant quadriceps weakness on the right.  In addition he has decreased knee jerk on the right.  He is right sciatic notch discomfort and L4 distribution of numbness.  He has a markedly positive seated straight leg raise.  He is miserable.     PAST MEDICAL/SURGICAL HISTORY  (Detailed)  Disease/disorder Onset Date Management Date Comments  High cholesterol      Hypertension          Family History  (Detailed) Patient reports there is no relevant family history.   SOCIAL HISTORY  (Detailed) Tobacco use reviewed. Preferred language is [place].   Smoking status: Former smoker.  SMOKING STATUS Use Status Type Smoking Status Usage Per Day Years Used Total Pack Years  yes  Former smoker             MEDICATIONS(added, continued or stopped this visit):   Medication Dose Prescribed Else Ind Started Stopped  diltiazem CD 240 mg capsule,extended release 24 hr 240 mg Y    Flexeril 10 mg tablet 10 mg Y    hydrocodone 5 mg-acetaminophen 325 mg tablet 5 mg-325 mg Y    lisinopril 20 mg tablet 20 mg Y    omeprazole 20 mg capsule,delayed release 20 mg Y    Valium 5 mg tablet 5 mg N 06/13/2013   voltaren 75mg  ORAL 75mg  Y    Xanax 0.5 mg tablet 0.5 mg Y    Zocor 20 mg tablet 20 mg Y      ALLERGIES:  Ingredient  Reaction Medication Name Comment  OXYCODONE TEREPHTHALATE Rash Percodan   ASPIRIN Rash Percodan   OXYCODONE HCL Rash Percodan   New allergies added during this encounter. Active list above.   Vitals Date Temp F BP Pulse Ht In Wt Lb BMI BSA Pain Score  07/07/2013  122/86 97 67 166 26  5/10      DIAGNOSTIC RESULTS Diagnostic report text  CLINICAL DATA: Low back and right leg pain.  EXAM: MRI LUMBAR SPINE WITHOUT CONTRAST  TECHNIQUE: Multiplanar, multisequence MR imaging was performed. No intravenous contrast was administered.  COMPARISON: 08/25/2012.  FINDINGS: Advanced degenerative lumbar spondylosis with significant multilevel disc disease and facet disease. There are endplate reactive changes and Schmorl's nodes. No acute fracture. The conus medullaris terminates at T12-L1. No definite pars defects. No significant paraspinal or retroperitoneal process is identified.  L1-2: Severe degenerative disc disease and moderate facet disease. There is a bulging degenerated annulus with flattening of the ventral thecal sac and mild to moderate bilateral lateral recess stenosis. No significant foraminal stenosis. No significant change.  L2-3: Severe degenerative disc disease and facet disease with a broad-based bulging degenerated annulus. There is moderate spinal and bilateral lateral recess stenosis and moderate bilateral foraminal stenosis, right greater than left.  No significant change.  L3-4: Right paracentral disc extrusion with probable free disc fragment up behind the L3 vertebral body. There is mass effect on the right side of the thecal sac and this likely affects the right L3 nerve root. Diffuse bulging annulus and paracentral disc protrusions grade mass effect on both sides of the thecal sac likely affecting both L4 nerve roots.  L4-5: Focal left paracentral disc protrusion with mild lateral recess encroachment and possible irritation of the left L5 nerve root.  This has desiccated and retracted when compared to the prior examination. Mild foraminal encroachment bilaterally appears stable.  L5-S1: No significant findings.  IMPRESSION: 1. Advanced degenerative lumbar spondylosis with significant multilevel disc disease and facet disease. 2. Stable moderate spinal and bilateral lateral recess stenosis and moderate bilateral foraminal stenosis, right greater than left at L2-3. 3. New right paracentral disc extrusion at L3-4 with probable free fragment up behind the L3 vertebral body. Mass effect on both the right L3 and L4 nerve roots. There is also encroachment on the left L4 nerve root. 4. Focal left paracentral disc protrusion at L4-5 has retracted and desiccated since the prior study.   Electronically Signed By: Loralie Champagne M.D. On: 07/03/2013 16:04    IMPRESSION Patient has a new onset severe right L3 and L4 radiculopathy.  I recommended expedited microdiscectomy on the right at the L3 L4 level.  I have not recommended treating the other levels which appear to have stable spondylosis.  He has significant quadriceps weakness and I therefore believe this should be done on an urgent basis  Completed Orders (this encounter) Order Details Reason Side Interpretation Result Initial Treatment Date Region  Dietary management education, guidance, and counseling f/u with pcp         Assessment/Plan # Detail Type Description   1. Assessment Obesity (278.00).   Plan Orders Today's instructions / counseling include(s) Dietary management education, guidance, and counseling.        Pain Assessment/Treatment Pain Scale: 5/10. Method: Numeric Pain Intensity Scale. Pain Assessment/Treatment follow-up plan of care: Patient currently taking pain medication..  Right L3 L4 microdiscectomy.  Risks and benefits were discussed with the patient and he wishes to proceed.  Orders: Instruction(s)/Education: Assessment Instruction  278.00 Dietary  management education, guidance, and counseling             Provider:  Danae Orleans. Venetia Maxon MD  07/07/2013 03:22 PM Dictation edited by: Danae Orleans. Venetia Maxon    CC Providers: Eleonore Chiquito [facility] [address] [place],  [place]  [address]-   [name] [facility] [place], [place] [address]- ----------------------------------------------------------------------------------------------------------------------------------------------------------------------         Electronically signed by Danae Orleans. Venetia Maxon MD on 07/07/2013 03:22 PM

## 2013-07-11 NOTE — Plan of Care (Signed)
Problem: Consults Goal: Diagnosis - Spinal Surgery Outcome: Completed/Met Date Met:  07/11/13 Lumbar Laminectomy (Complex)

## 2013-07-11 NOTE — Transfer of Care (Signed)
Immediate Anesthesia Transfer of Care Note  Patient: Aaron Black  Procedure(s) Performed: Procedure(s) with comments: LUMBAR LAMINECTOMY/DECOMPRESSION MICRODISCECTOMY 1 LEVEL (Right) - Right L34 microdiskectomy  Patient Location: PACU  Anesthesia Type:General  Level of Consciousness: awake, oriented and patient cooperative  Airway & Oxygen Therapy: Patient Spontanous Breathing  Post-op Assessment: Report given to PACU RN, Post -op Vital signs reviewed and stable and Patient moving all extremities X 4  Post vital signs: Reviewed and stable  Complications: No apparent anesthesia complications

## 2013-07-11 NOTE — Interval H&P Note (Signed)
History and Physical Interval Note:  07/11/2013 10:17 AM  Aaron Black  has presented today for surgery, with the diagnosis of lumbar herniated disc  The various methods of treatment have been discussed with the patient and family. After consideration of risks, benefits and other options for treatment, the patient has consented to  Procedure(s) with comments: LUMBAR LAMINECTOMY/DECOMPRESSION MICRODISCECTOMY 1 LEVEL (Right) - Right L34 microdiskectomy as a surgical intervention .  The patient's history has been reviewed, patient examined, no change in status, stable for surgery.  I have reviewed the patient's chart and labs.  Questions were answered to the patient's satisfaction.     Macenzie Burford D

## 2013-07-11 NOTE — Anesthesia Postprocedure Evaluation (Signed)
Anesthesia Post Note  Patient: Aaron Black  Procedure(s) Performed: Procedure(s) (LRB): LUMBAR LAMINECTOMY/DECOMPRESSION MICRODISCECTOMY 1 LEVEL (Right)  Anesthesia type: General  Patient location: PACU  Post pain: Pain level controlled and Adequate analgesia  Post assessment: Post-op Vital signs reviewed, Patient's Cardiovascular Status Stable, Respiratory Function Stable, Patent Airway and Pain level controlled  Last Vitals:  Filed Vitals:   07/11/13 1329  BP: 150/88  Pulse: 63  Temp: 36.6 C  Resp:     Post vital signs: Reviewed and stable  Level of consciousness: awake, alert  and oriented  Complications: No apparent anesthesia complications

## 2013-07-11 NOTE — Discharge Summary (Signed)
Physician Discharge Summary  Patient ID: Aaron Black MRN: [id number] DOB/AGE: Jun 20, 1956 57 y.o.  Admit date: 07/11/2013 Discharge date: 07/11/2013  Admission Diagnoses:Herniated lumbar disc L 34  Discharge Diagnoses: Same Active Problems:   Herniated lumbar disc without myelopathy   Discharged Condition: good  Hospital Course: Uncomplicated L 34 microdiscectomy Right  Consults: None  Significant Diagnostic Studies: None  Treatments: surgery: L 34 microdiscectomy Right   Discharge Exam: Blood pressure 150/88, pulse 63, temperature 97.8 F (36.6 C), temperature source Oral, resp. rate 13, SpO2 97.00%. Neurologic: Alert and oriented X 3, normal strength and tone. Normal symmetric reflexes. Normal coordination and gait Wound:CDI  Disposition: Home     Medication List    ASK your doctor about these medications       ALPRAZolam 0.5 MG tablet  Commonly known as:  XANAX  Take 1 tablet (0.5 mg total) by mouth 3 (three) times daily as needed for sleep or anxiety.     B-D ECLIPSE SYRINGE 30G X 1/2" 1 ML Misc  Generic drug:  Syringe/Needle (Disp)  every 30 (thirty) days.     cyanocobalamin 1000 MCG/ML injection  Commonly known as:  (VITAMIN B-12)  Inject 1 mL (1,000 mcg total) into the muscle every 30 (thirty) days.     cyclobenzaprine 10 MG tablet  Commonly known as:  FLEXERIL  Take 10 mg by mouth 2 (two) times daily.     diclofenac 75 MG EC tablet  Commonly known as:  VOLTAREN  Take 1 tablet (75 mg total) by mouth 2 (two) times daily.     diltiazem 240 MG 24 hr capsule  Commonly known as:  CARDIZEM CD  TAKE ONE CAPSULE BY MOUTH EVERY DAY     diltiazem 240 MG 24 hr capsule  Commonly known as:  DILACOR XR  Take 240 mg by mouth daily.     HYDROcodone-acetaminophen 5-325 MG per tablet  Commonly known as:  NORCO/VICODIN  Take 1 tablet by mouth every 6 (six) hours as needed for pain.     lisinopril 20 MG tablet  Commonly known as:  PRINIVIL,ZESTRIL  Take  20 mg by mouth daily.     omeprazole 20 MG tablet  Commonly known as:  PRILOSEC OTC  Take 2 tablets (40 mg total) by mouth daily.     sertraline 50 MG tablet  Commonly known as:  ZOLOFT  Take 1 tablet (50 mg total) by mouth daily.     simvastatin 20 MG tablet  Commonly known as:  ZOCOR  Take 20 mg by mouth every evening.         Signed: Dorian Heckle, MD 07/11/2013, 3:08 PM

## 2013-07-11 NOTE — Op Note (Signed)
PATIENT:  Aaron Black  57 y.o. male  PRE-OPERATIVE DIAGNOSIS:  lumbar herniated disc L 34 Right with spondylosis, degenerative disc disease, radiculopathy  POST-OPERATIVE DIAGNOSIS: lumbar herniated disc L 34 Right with spondylosis, degenerative disc disease, radiculopathy  PROCEDURE:  Procedure(s) with comments: LUMBAR LAMINECTOMY/DECOMPRESSION MICRODISCECTOMY 1 LEVEL (Right) - Right L34 microdiskectomy  with microdissection  SURGEON:  Surgeon(s) and Role:    * Maeola Harman, MD - Primary  PHYSICIAN ASSISTANT: Wynetta Emery, MD  ASSISTANTS: Poteat, RN   ANESTHESIA:   general  EBL:  Total I/O In: 1500 [I.V.:1500] Out: -   BLOOD ADMINISTERED:none  DRAINS: none   LOCAL MEDICATIONS USED:  LIDOCAINE   SPECIMEN:  No Specimen  DISPOSITION OF SPECIMEN:  N/A  COUNTS:  YES  TOURNIQUET:  * No tourniquets in log *  DICTATION: DICTATION: Patient has a large L 34 disc rupture on the right with significant right leg weakness. It was elected to take him to surgery for right L34 microdiscectomy.  Procedure: Patient was brought to the operating room and following the smooth and uncomplicated induction of general endotracheal anesthesia he was placed in a prone position on the Wilson frame. Low back was prepped and draped in the usual sterile fashion with betadine scrub and DuraPrep. Preop localizing radiograph was obtained.  Area of planned incision was infiltrated with local lidocaine. Incision was made in the midline and carried to the lumbodorsal fascia which was incised on the right side of midline. Subperiosteal dissection was performed exposing what was felt to be L45 level. Intraoperative x-ray demonstrated marker probes at L3-4.  A hemi-semi-laminectomy of L4 was performed a high-speed drill and completed with Kerrison rongeurs and a generous foraminotomy was performed overlying the superior aspect of the L4 lamina. Ligamentum flavum was detached and removed in a piecemeal fashion and the L4  nerve root was decompressed laterally with removal of the superior aspect of the facet and ligamentum causing nerve root compression. The microscope was brought into the field and the L4 nerve root and thecal sac were mobilized medially. This exposed a large amount of soft disc material and a free fragment of herniated disc material. Multiple fragments were removed cephalad to the interspace  The redundant annulus was also removed with 2 mm Kerrison rongeur. There was evidence on the MRI of cephalad fragments and these were carefully removed and the L3 nerve root was also decompressed. At this point it was felt that all neural elements were well decompressed and there was no evidence of residual loose disc material. The interspace was not opened. Hemostasis was assured with bipolar electrocautery. A tiny opening in the Dura was visualized which was covered with Duragen. The lumbodorsal fascia was closed with 0 Vicryl sutures the subcutaneous tissues reapproximated 2-0 Vicryl inverted sutures and the skin edges were reapproximated with 3-0 Vicryl subcuticular stitch. The wound is dressed with Dermabond. Patient was extubated in the operating room and taken to recovery in stable and satisfactory condition having tolerated his operation well counts were correct at the end of the case.  PLAN OF CARE: Admit for overnight observation  PATIENT DISPOSITION:  PACU - hemodynamically stable.   Delay start of Pharmacological VTE agent (>24hrs) due to surgical blood loss or risk of bleeding: yes

## 2013-07-11 NOTE — Preoperative (Signed)
Beta Blockers   Reason not to administer Beta Blockers:Not Applicable 

## 2013-07-11 NOTE — Progress Notes (Signed)
Pt. Alert and oriented,follows simple instructions, denies pain. Incision area without swelling, redness or S/S of infection. Voiding adequate clear yellow urine. Moving all extremities well and vitals stable and documented. Patient discharged home with spouse. Scripts and Lumbar surgery instructions given to patient and family member for home safety and precautions. Pt. and family stated understanding of instructions given.

## 2013-07-11 NOTE — Anesthesia Preprocedure Evaluation (Addendum)
Anesthesia Evaluation  Patient identified by MRN, date of birth, ID band Patient awake    Reviewed: Allergy & Precautions, H&P , NPO status , Patient's Chart, lab work & pertinent test results  Airway Mallampati: II  Neck ROM: full    Dental   Pulmonary sleep apnea , former smoker,          Cardiovascular hypertension,     Neuro/Psych Anxiety Depression    GI/Hepatic GERD-  ,  Endo/Other    Renal/GU Renal InsufficiencyRenal disease     Musculoskeletal  (+) Arthritis -,   Abdominal   Peds  Hematology   Anesthesia Other Findings   Reproductive/Obstetrics                          Anesthesia Physical Anesthesia Plan  ASA: III  Anesthesia Plan: General   Post-op Pain Management:    Induction: Intravenous  Airway Management Planned: Oral ETT  Additional Equipment:   Intra-op Plan:   Post-operative Plan: Extubation in OR  Informed Consent: I have reviewed the patients History and Physical, chart, labs and discussed the procedure including the risks, benefits and alternatives for the proposed anesthesia with the patient or authorized representative who has indicated his/her understanding and acceptance.     Plan Discussed with: CRNA, Anesthesiologist and Surgeon  Anesthesia Plan Comments:         Anesthesia Quick Evaluation

## 2013-07-16 ENCOUNTER — Encounter (HOSPITAL_COMMUNITY): Payer: Self-pay | Admitting: Neurosurgery

## 2013-07-22 ENCOUNTER — Other Ambulatory Visit: Payer: Self-pay | Admitting: Internal Medicine

## 2013-08-11 DIAGNOSIS — E669 Obesity, unspecified: Secondary | ICD-10-CM | POA: Insufficient documentation

## 2013-08-13 ENCOUNTER — Other Ambulatory Visit: Payer: Self-pay | Admitting: Internal Medicine

## 2013-08-24 HISTORY — PX: KNEE ARTHROSCOPY: SUR90

## 2013-08-29 ENCOUNTER — Other Ambulatory Visit: Payer: Self-pay | Admitting: Neurosurgery

## 2013-08-29 DIAGNOSIS — M48061 Spinal stenosis, lumbar region without neurogenic claudication: Secondary | ICD-10-CM

## 2013-08-30 ENCOUNTER — Ambulatory Visit
Admission: RE | Admit: 2013-08-30 | Discharge: 2013-08-30 | Disposition: A | Discharge status: Home / Self Care | Payer: Managed Care, Other (non HMO) | Source: Ambulatory Visit | Attending: Neurosurgery | Admitting: Neurosurgery

## 2013-08-30 DIAGNOSIS — M48061 Spinal stenosis, lumbar region without neurogenic claudication: Secondary | ICD-10-CM

## 2013-08-30 MED ORDER — GADOBENATE DIMEGLUMINE 529 MG/ML IV SOLN
17.0000 mL | Freq: Once | INTRAVENOUS | Status: AC | PRN
  Administered 2013-08-30: 17 mL via INTRAVENOUS

## 2013-09-05 ENCOUNTER — Other Ambulatory Visit: Payer: Managed Care, Other (non HMO)

## 2013-10-01 ENCOUNTER — Other Ambulatory Visit: Payer: Self-pay | Admitting: Internal Medicine

## 2013-11-21 HISTORY — PX: KNEE ARTHROSCOPY: SUR90

## 2013-12-18 ENCOUNTER — Encounter: Payer: Self-pay | Admitting: Internal Medicine

## 2013-12-18 ENCOUNTER — Ambulatory Visit (INDEPENDENT_AMBULATORY_CARE_PROVIDER_SITE_OTHER): Payer: Managed Care, Other (non HMO) | Admitting: Internal Medicine

## 2013-12-18 VITALS — BP 128/80 | HR 94 | Temp 98.6°F | Resp 20 | Ht 67.0 in | Wt 170.0 lb

## 2013-12-18 DIAGNOSIS — I10 Essential (primary) hypertension: Secondary | ICD-10-CM

## 2013-12-18 DIAGNOSIS — J069 Acute upper respiratory infection, unspecified: Secondary | ICD-10-CM

## 2013-12-18 DIAGNOSIS — B9789 Other viral agents as the cause of diseases classified elsewhere: Secondary | ICD-10-CM

## 2013-12-18 MED ORDER — HYDROCODONE-HOMATROPINE 5-1.5 MG/5ML PO SYRP: 5.0000 mL | ORAL_SOLUTION | Freq: Four times a day (QID) | ORAL | Status: DC | PRN

## 2013-12-18 NOTE — Progress Notes (Signed)
Subjective:    Patient ID: Aaron Black, male    DOB: 04/10/1956, 58 y.o.   MRN: [id number]  HPI  58 year old patient who has a history of hypertension, and a 80 pack year smoking history.  He presents with a four-day history of chest congestion, cough, increasing shortness of breath, fever, achiness.  Symptoms have interfered with sleep.  He is improved, but still weak.  His fever has resolved.  Past Medical History  Diagnosis Date  . Diverticulosis of colon   . GERD (gastroesophageal reflux disease)   . Cervical disc disease     a. 07/2003 ant cervical deomcpression and fusion C5-6/C6-7;  b. 09/2007 post cervical laminectomy C4-5 with scres and arthrodesis C4-C7.  Marland Kitchen Sleep apnea   . Arthritis   . Anxiety   . Hypertension   . Colon polyps   . B12 deficiency   . Anemia   . Hyperlipidemia   . Gout   . Pleurisy   . Tobacco abuse     a. 40 yrs, 1.5-3 ppd over that time.  . Chronic lower back pain   . Complication of anesthesia     " i WOKE UP DURING A COLONOSCOPY "  . Chest pain at rest 02/25/2013  . Depression     History   Social History  . Marital Status: Married    Spouse Name: N/A    Number of Children: N/A  . Years of Education: N/A   Occupational History  . Not on file.   Social History Main Topics  . Smoking status: Former Smoker -- 2.00 packs/day for 40 years    Quit date: 02/24/2013  . Smokeless tobacco: Never Used     Comment: Currently smoking 1.5 ppd.  Has previously smoked up to 3 ppd and did so for about 20 yrs.  . Alcohol Use: Yes     Comment: at least 3 beers/night.  . Drug Use: No  . Sexual Activity: Not on file   Other Topics Concern  . Not on file   Social History Narrative   Lives in [place] with wife.  Does not work.  Previously read H2O meters for city of [place].    Past Surgical History  Procedure Laterality Date  . Rotator cuff repair  August 2000, January 2002, July 2008, July 2009    2 left 2 right  . Neck surgery  January 2005, March  2009    C-Spine  . Cervical disc surgery      X 2  . Elbow arthroscopy Right   . Carpal tunnel release Right   . Colonoscopy    . Lumbar laminectomy/decompression microdiscectomy Right 07/11/2013    Procedure: LUMBAR LAMINECTOMY/DECOMPRESSION MICRODISCECTOMY 1 LEVEL;  Surgeon: Erline Levine, MD;  Location: [facility];  Service: Neurosurgery;  Laterality: Right;  Right L34 microdiskectomy    Family History  Problem Relation Age of Onset  . Bone cancer Mother   . Colon polyps Mother   . Squamous cell carcinoma Mother     died @ 62  . Hypertension Father   . Colon polyps Father   . Diabetes Father     borderline  . Congestive Heart Failure Father     alive @ 68  . Sudden death Brother     died @ 33  . Other Brother     died in [place] @ 61 - struck by drunk driver  . Other Brother     died in [place] @ 79 - struck by  drunk driver    Allergies  Allergen Reactions  . Percodan [Oxycodone-Aspirin] Rash    Percodan caused rash---but he can tolerate Percocet and aspirin on their own     Current Outpatient Prescriptions on File Prior to Visit  Medication Sig Dispense Refill  . ALPRAZolam (XANAX) 0.5 MG tablet TAKE 1 TABLET BY MOUTH 3 TIMES DAILY AS NEEDED FOR SLEEP OR ANXIETY  90 tablet  2  . cyanocobalamin (,VITAMIN B-12,) 1000 MCG/ML injection Inject 1 mL (1,000 mcg total) into the muscle every 30 (thirty) days.  10 mL  2  . diazepam (VALIUM) 5 MG tablet Take 1 tablet (5 mg total) by mouth every 6 (six) hours as needed for muscle spasms.  60 tablet  0  . diclofenac (VOLTAREN) 75 MG EC tablet Take 1 tablet (75 mg total) by mouth 2 (two) times daily.  180 tablet  6  . diltiazem (CARDIZEM CD) 240 MG 24 hr capsule TAKE ONE CAPSULE BY MOUTH EVERY DAY  90 capsule  1  . HYDROcodone-acetaminophen (NORCO/VICODIN) 5-325 MG per tablet Take 1 tablet by mouth every 6 (six) hours as needed for moderate pain.  70 tablet  0  . lisinopril (PRINIVIL,ZESTRIL) 20 MG tablet TAKE 1 TABLET BY MOUTH EVERY DAY   90 tablet  2  . omeprazole (PRILOSEC OTC) 20 MG tablet Take 2 tablets (40 mg total) by mouth daily.      . sertraline (ZOLOFT) 50 MG tablet Take 1 tablet (50 mg total) by mouth daily.  90 tablet  3  . simvastatin (ZOCOR) 20 MG tablet TAKE 1 TABLET BY MOUTH AT BEDTIME  90 tablet  1  . Syringe/Needle, Disp, (B-D ECLIPSE SYRINGE) 30G X 1/2" 1 ML MISC every 30 (thirty) days.         No current facility-administered medications on file prior to visit.    BP 128/80  Pulse 94  Temp(Src) 98.6 F (37 C) (Oral)  Resp 20  Ht 5\' 7"  (1.702 m)  Wt 170 lb (77.111 kg)  BMI 26.62 kg/m2  SpO2 96%       Review of Systems  Constitutional: Positive for fever, activity change, appetite change and fatigue. Negative for chills.  HENT: Negative for congestion, dental problem, ear pain, hearing loss, sore throat, tinnitus, trouble swallowing and voice change.   Eyes: Negative for pain, discharge and visual disturbance.  Respiratory: Positive for cough and shortness of breath. Negative for chest tightness, wheezing and stridor.   Cardiovascular: Negative for chest pain, palpitations and leg swelling.  Gastrointestinal: Negative for nausea, vomiting, abdominal pain, diarrhea, constipation, blood in stool and abdominal distention.  Genitourinary: Negative for urgency, hematuria, flank pain, discharge, difficulty urinating and genital sores.  Musculoskeletal: Negative for arthralgias, back pain, gait problem, joint swelling, myalgias and neck stiffness.  Skin: Negative for rash.  Neurological: Positive for weakness. Negative for dizziness, syncope, speech difficulty, numbness and headaches.  Hematological: Negative for adenopathy. Does not bruise/bleed easily.  Psychiatric/Behavioral: Negative for behavioral problems and dysphoric mood. The patient is not nervous/anxious.        Objective:   Physical Exam  Constitutional: He is oriented to person, place, and time. He appears well-developed.   Afebrile Appears unwell, but in no distress Vital signs stable  HENT:  Head: Normocephalic.  Right Ear: External ear normal.  Left Ear: External ear normal.  Eyes: Conjunctivae and EOM are normal.  Neck: Normal range of motion.  Cardiovascular: Normal rate and normal heart sounds.   Pulmonary/Chest: Effort normal and  breath sounds normal. No respiratory distress. He has no wheezes. He has no rales.  Abdominal: Bowel sounds are normal.  Musculoskeletal: Normal range of motion. He exhibits no edema and no tenderness.  Neurological: He is alert and oriented to person, place, and time.  Psychiatric: He has a normal mood and affect. His behavior is normal.          Assessment & Plan:   Viral URI with cough.  We'll treat symptomatically Hypertension stable Remote tobacco use

## 2013-12-18 NOTE — Progress Notes (Signed)
Pre-visit discussion using our clinic review tool. No additional management support is needed unless otherwise documented below in the visit note.  

## 2013-12-18 NOTE — Patient Instructions (Signed)
Acute bronchitis symptoms for less than 10 days are generally not helped by antibiotics.  Take over-the-counter expectorants and cough medications such as  Mucinex DM.  Call if there is no improvement in 5 to 7 days or if  you develop worsening cough, fever, or new symptoms, such as shortness of breath or chest pain.   

## 2013-12-19 ENCOUNTER — Telehealth: Payer: Self-pay | Admitting: Internal Medicine

## 2013-12-19 NOTE — Telephone Encounter (Signed)
Relevant patient education mailed to patient.  

## 2013-12-20 ENCOUNTER — Other Ambulatory Visit: Payer: Self-pay | Admitting: Internal Medicine

## 2013-12-25 ENCOUNTER — Ambulatory Visit (INDEPENDENT_AMBULATORY_CARE_PROVIDER_SITE_OTHER): Payer: Managed Care, Other (non HMO) | Admitting: Internal Medicine

## 2013-12-25 ENCOUNTER — Telehealth: Payer: Self-pay | Admitting: Internal Medicine

## 2013-12-25 ENCOUNTER — Encounter: Payer: Self-pay | Admitting: Internal Medicine

## 2013-12-25 VITALS — BP 120/72 | HR 81 | Temp 98.4°F | Resp 20 | Ht 67.0 in | Wt 170.0 lb

## 2013-12-25 DIAGNOSIS — J441 Chronic obstructive pulmonary disease with (acute) exacerbation: Secondary | ICD-10-CM

## 2013-12-25 DIAGNOSIS — I10 Essential (primary) hypertension: Secondary | ICD-10-CM

## 2013-12-25 MED ORDER — HYDROCODONE-HOMATROPINE 5-1.5 MG/5ML PO SYRP: 5.0000 mL | ORAL_SOLUTION | Freq: Four times a day (QID) | ORAL | Status: DC | PRN

## 2013-12-25 MED ORDER — AZITHROMYCIN 250 MG PO TABS: ORAL_TABLET | ORAL | Status: DC

## 2013-12-25 NOTE — Progress Notes (Signed)
Pre-visit discussion using our clinic review tool. No additional management support is needed unless otherwise documented below in the visit note.  

## 2013-12-25 NOTE — Telephone Encounter (Signed)
Pt wife called in pt was in today 12/25/13 and would like to know if he can rx him a inhaler the one he had is empty pt wife said it was a sample that Dr Raliegh Ip had given the pt while in the office pt wife think the name of the inhaler was [place]

## 2013-12-25 NOTE — Telephone Encounter (Signed)
Please advise 

## 2013-12-25 NOTE — Telephone Encounter (Signed)
Spoke to pt's wife Tomi Bamberger, told her need to contact insurance to see what inhalers are on the formulary list, cause Pro air and Albuterol are not. Tomi Bamberger verbalized understanding.

## 2013-12-25 NOTE — Progress Notes (Signed)
Subjective:    Patient ID: Aaron Black, male    DOB: Mar 20, 1956, 58 y.o.   MRN: [id number]  HPI  58 year old patient who has hypertension and a history of former tobacco use.  He was seen recently and treated symptomatically for a URI.  He is seen today in followup with persistent cough and worsening chest congestion. No fever.  In general, he may feel marginally improved.  Past Medical History  Diagnosis Date  . Diverticulosis of colon   . GERD (gastroesophageal reflux disease)   . Cervical disc disease     a. 07/2003 ant cervical deomcpression and fusion C5-6/C6-7;  b. 09/2007 post cervical laminectomy C4-5 with scres and arthrodesis C4-C7.  Marland Kitchen Sleep apnea   . Arthritis   . Anxiety   . Hypertension   . Colon polyps   . B12 deficiency   . Anemia   . Hyperlipidemia   . Gout   . Pleurisy   . Tobacco abuse     a. 40 yrs, 1.5-3 ppd over that time.  . Chronic lower back pain   . Complication of anesthesia     " i WOKE UP DURING A COLONOSCOPY "  . Chest pain at rest 02/25/2013  . Depression     History   Social History  . Marital Status: Married    Spouse Name: N/A    Number of Children: N/A  . Years of Education: N/A   Occupational History  . Not on file.   Social History Main Topics  . Smoking status: Former Smoker -- 2.00 packs/day for 40 years    Quit date: 02/24/2013  . Smokeless tobacco: Never Used     Comment: Currently smoking 1.5 ppd.  Has previously smoked up to 3 ppd and did so for about 20 yrs.  . Alcohol Use: Yes     Comment: at least 3 beers/night.  . Drug Use: No  . Sexual Activity: Not on file   Other Topics Concern  . Not on file   Social History Narrative   Lives in [place] with wife.  Does not work.  Previously read H2O meters for city of [place].    Past Surgical History  Procedure Laterality Date  . Rotator cuff repair  August 2000, January 2002, July 2008, July 2009    2 left 2 right  . Neck surgery  January 2005, March 2009    C-Spine  .  Cervical disc surgery      X 2  . Elbow arthroscopy Right   . Carpal tunnel release Right   . Colonoscopy    . Lumbar laminectomy/decompression microdiscectomy Right 07/11/2013    Procedure: LUMBAR LAMINECTOMY/DECOMPRESSION MICRODISCECTOMY 1 LEVEL;  Surgeon: Erline Levine, MD;  Location: [facility];  Service: Neurosurgery;  Laterality: Right;  Right L34 microdiskectomy    Family History  Problem Relation Age of Onset  . Bone cancer Mother   . Colon polyps Mother   . Squamous cell carcinoma Mother     died @ 56  . Hypertension Father   . Colon polyps Father   . Diabetes Father     borderline  . Congestive Heart Failure Father     alive @ 56  . Sudden death Brother     died @ 56  . Other Brother     died in [place] @ 56 - struck by drunk driver  . Other Brother     died in [place] @ 56 - struck by drunk driver  Allergies  Allergen Reactions  . Percodan [Oxycodone-Aspirin] Rash    Percodan caused rash---but he can tolerate Percocet and aspirin on their own     Current Outpatient Prescriptions on File Prior to Visit  Medication Sig Dispense Refill  . ALPRAZolam (XANAX) 0.5 MG tablet TAKE 1 TABLET BY MOUTH 3 TIMES DAILY AS NEEDED FOR SLEEP OR ANXIETY  90 tablet  2  . cyanocobalamin (,VITAMIN B-12,) 1000 MCG/ML injection Inject 1 mL (1,000 mcg total) into the muscle every 30 (thirty) days.  10 mL  2  . diclofenac (VOLTAREN) 75 MG EC tablet Take 1 tablet (75 mg total) by mouth 2 (two) times daily.  180 tablet  6  . diltiazem (CARDIZEM CD) 240 MG 24 hr capsule TAKE ONE CAPSULE BY MOUTH EVERY DAY  90 capsule  1  . HYDROcodone-acetaminophen (NORCO/VICODIN) 5-325 MG per tablet Take 1 tablet by mouth every 6 (six) hours as needed for moderate pain.  70 tablet  0  . lisinopril (PRINIVIL,ZESTRIL) 20 MG tablet TAKE 1 TABLET BY MOUTH EVERY DAY  90 tablet  2  . omeprazole (PRILOSEC OTC) 20 MG tablet Take 2 tablets (40 mg total) by mouth daily.      . sertraline (ZOLOFT) 50 MG tablet Take 1  tablet (50 mg total) by mouth daily.  90 tablet  3  . simvastatin (ZOCOR) 20 MG tablet TAKE 1 TABLET BY MOUTH AT BEDTIME  90 tablet  1  . Syringe/Needle, Disp, (B-D ECLIPSE SYRINGE) 30G X 1/2" 1 ML MISC every 30 (thirty) days.         No current facility-administered medications on file prior to visit.    BP 120/72  Pulse 81  Temp(Src) 98.4 F (36.9 C) (Oral)  Resp 20  Ht 5\' 7"  (1.702 m)  Wt 170 lb (77.111 kg)  BMI 26.62 kg/m2  SpO2 96%       Review of Systems  Constitutional: Positive for activity change, appetite change and fatigue. Negative for fever and chills.  HENT: Negative for congestion, dental problem, ear pain, hearing loss, sore throat, tinnitus, trouble swallowing and voice change.   Eyes: Negative for pain, discharge and visual disturbance.  Respiratory: Positive for cough and shortness of breath. Negative for chest tightness, wheezing and stridor.   Cardiovascular: Negative for chest pain, palpitations and leg swelling.  Gastrointestinal: Negative for nausea, vomiting, abdominal pain, diarrhea, constipation, blood in stool and abdominal distention.  Genitourinary: Negative for urgency, hematuria, flank pain, discharge, difficulty urinating and genital sores.  Musculoskeletal: Negative for arthralgias, back pain, gait problem, joint swelling, myalgias and neck stiffness.  Skin: Negative for rash.  Neurological: Negative for dizziness, syncope, speech difficulty, weakness, numbness and headaches.  Hematological: Negative for adenopathy. Does not bruise/bleed easily.  Psychiatric/Behavioral: Negative for behavioral problems and dysphoric mood. The patient is not nervous/anxious.        Objective:   Physical Exam  Constitutional: He is oriented to person, place, and time. He appears well-developed and well-nourished. No distress.  HENT:  Head: Normocephalic.  Right Ear: External ear normal.  Left Ear: External ear normal.  Eyes: Conjunctivae and EOM are normal.   Neck: Normal range of motion.  Cardiovascular: Normal rate and normal heart sounds.   Pulmonary/Chest: Effort normal. He has wheezes.  Scattered coarse rhonchi and faint wheezing No respiratory distress or increased work of breathing  Abdominal: Bowel sounds are normal.  Musculoskeletal: Normal range of motion. He exhibits no edema and no tenderness.  Neurological: He is alert and  oriented to person, place, and time.  Psychiatric: He has a normal mood and affect. His behavior is normal.          Assessment & Plan:   Exacerbation of COPD with some mild bronchospasm.  We'll continue symptomatic treatment.  We'll treat with azithromycin.  Continue inhalational bronchodilators Hypertension improved

## 2013-12-25 NOTE — Telephone Encounter (Signed)
Please call in a prescription for proair  inhaler

## 2013-12-25 NOTE — Patient Instructions (Signed)
Take your antibiotic as prescribed until ALL of it is gone, but stop if you develop a rash, swelling, or any side effects of the medication.  Contact our office as soon as possible if  there are side effects of the medication.  Call or return to clinic prn if these symptoms worsen or fail to improve as anticipated.  

## 2013-12-26 MED ORDER — ALBUTEROL SULFATE HFA 108 (90 BASE) MCG/ACT IN AERS: 2.0000 | INHALATION_SPRAY | Freq: Four times a day (QID) | RESPIRATORY_TRACT | Status: DC | PRN

## 2013-12-26 NOTE — Telephone Encounter (Signed)
Spoke to [place] told her I received her voicemail and I sent Pro air Inhaler to the pharmacy for him. Tomi Bamberger verbalized understanding.

## 2013-12-26 NOTE — Addendum Note (Signed)
Addended by: Marian Sorrow on: 12/26/2013 04:38 PM   Modules accepted: Orders

## 2014-01-01 ENCOUNTER — Telehealth: Payer: Self-pay | Admitting: Internal Medicine

## 2014-01-01 MED ORDER — HYDROCODONE-HOMATROPINE 5-1.5 MG/5ML PO SYRP: 5.0000 mL | ORAL_SOLUTION | Freq: Four times a day (QID) | ORAL | Status: DC | PRN

## 2014-01-01 NOTE — Telephone Encounter (Signed)
ok 

## 2014-01-01 NOTE — Telephone Encounter (Signed)
Okay to refill cough syrup, last fill 6/4. Please advise

## 2014-01-01 NOTE — Telephone Encounter (Signed)
Pt is requesting another rx for hydrocodone cough med.

## 2014-01-01 NOTE — Telephone Encounter (Signed)
Pt's wife Tomi Bamberger notified Rx ready for pickup. Rx printed and signed.

## 2014-01-14 ENCOUNTER — Other Ambulatory Visit: Payer: Self-pay | Admitting: Internal Medicine

## 2014-02-06 ENCOUNTER — Other Ambulatory Visit: Payer: Self-pay | Admitting: Internal Medicine

## 2014-02-13 ENCOUNTER — Other Ambulatory Visit: Payer: Self-pay | Admitting: Internal Medicine

## 2014-03-15 ENCOUNTER — Other Ambulatory Visit: Payer: Self-pay | Admitting: Internal Medicine

## 2014-04-16 ENCOUNTER — Other Ambulatory Visit: Payer: Self-pay | Admitting: Internal Medicine

## 2014-04-18 ENCOUNTER — Other Ambulatory Visit: Payer: Self-pay | Admitting: Internal Medicine

## 2014-04-27 DIAGNOSIS — M4310 Spondylolisthesis, site unspecified: Secondary | ICD-10-CM | POA: Insufficient documentation

## 2014-05-01 ENCOUNTER — Other Ambulatory Visit: Payer: Self-pay | Admitting: Neurosurgery

## 2014-05-19 ENCOUNTER — Ambulatory Visit (INDEPENDENT_AMBULATORY_CARE_PROVIDER_SITE_OTHER): Payer: Managed Care, Other (non HMO) | Admitting: Physician Assistant

## 2014-05-19 ENCOUNTER — Encounter: Payer: Self-pay | Admitting: Physician Assistant

## 2014-05-19 VITALS — BP 148/80 | HR 84 | Ht 67.0 in | Wt 170.0 lb

## 2014-05-19 DIAGNOSIS — Z01810 Encounter for preprocedural cardiovascular examination: Secondary | ICD-10-CM

## 2014-05-19 DIAGNOSIS — E785 Hyperlipidemia, unspecified: Secondary | ICD-10-CM

## 2014-05-19 DIAGNOSIS — I10 Essential (primary) hypertension: Secondary | ICD-10-CM

## 2014-05-19 NOTE — Patient Instructions (Addendum)
FOLLOW UP AS NEEDED WITH DR. Martinique  NO CHANGES WERE MADE TODAY WITH YOUR MEDICATIONS

## 2014-05-19 NOTE — Progress Notes (Signed)
Cardiology Office Note   Date:  05/19/2014   ID:  Aaron Black, DOB 1955/11/02, MRN [id number]  PCP:  Nyoka Cowden, MD  Cardiologist:  Dr. Peter Martinique     History of Present Illness: Aaron Black is a 58 y.o. male with a hx of DDD, GERD, OSA, HTN, HL, tobacco abuse, B12 deficiency.  Patient was seen by Dr. Martinique in 02/2013 while in the hospital for chest pain. Cardiac enzymes remained normal.   Echocardiogram revealed normal LV function.  Inpatient Myoview demonstrated no ischemia. No further workup was pursued.  He presents for surgical clearance. He needs a multilevel lumbar spine fusion with Dr. Vertell Limber 06/05/14. Given the extensive nature of his surgery, cardiac clearance has been requested. He denies chest discomfort or shortness of breath. He denies syncope. Denies orthopnea, PND or edema. He is quite limited in his activities given his significant back pain and lower extremity weakness.   Studies:  - Echo (8/14):  Mild LVH. EF 55%. Gr 1 DD.   Normal RV size and systolic function. No significant valvular abnormalities.  - Nuclear (8/14):  Impression: Normal Lexiscan myoview stress test. No ischemia.  Normal LV systolic function.   Recent Labs/Images:  Recent Labs  07/10/13 0935  NA 134*  K 4.3  BUN 29*  CREATININE 1.61*  HGB 11.9*      Wt Readings from Last 3 Encounters:  05/19/14 170 lb (77.111 kg)  12/25/13 170 lb (77.111 kg)  12/18/13 170 lb (77.111 kg)     Past Medical History  Diagnosis Date  . Diverticulosis of colon   . GERD (gastroesophageal reflux disease)   . Cervical disc disease     a. 07/2003 ant cervical deomcpression and fusion C5-6/C6-7;  b. 09/2007 post cervical laminectomy C4-5 with scres and arthrodesis C4-C7.  Marland Kitchen Sleep apnea   . Arthritis   . Anxiety   . Hypertension   . Colon polyps   . B12 deficiency   . Anemia   . Hyperlipidemia   . Gout   . Pleurisy   . Tobacco abuse     a. 40 yrs, 1.5-3 ppd over that time.  .  Chronic lower back pain   . Complication of anesthesia     " i WOKE UP DURING A COLONOSCOPY "  . Chest pain at rest 02/25/2013  . Depression     Current Outpatient Prescriptions  Medication Sig Dispense Refill  . ALPRAZolam (XANAX) 0.5 MG tablet TAKE 1 TABLET BY MOUTH 3 TIMES A DAY AS NEEDED  90 tablet  2  . colchicine 0.6 MG tablet Take 0.6 mg by mouth as needed.       . cyanocobalamin (,VITAMIN B-12,) 1000 MCG/ML injection inject 1 milliliter INTO THE MUSCLE EVERY 30 DAYS  10 mL  2  . cyclobenzaprine (FLEXERIL) 10 MG tablet Take 10 mg by mouth 2 (two) times daily.       . diclofenac (VOLTAREN) 75 MG EC tablet Take 1 tablet (75 mg total) by mouth 2 (two) times daily.  180 tablet  6  . diltiazem (CARDIZEM CD) 240 MG 24 hr capsule TAKE ONE CAPSULE BY MOUTH EVERY DAY  90 capsule  1  . HYDROcodone-acetaminophen (NORCO/VICODIN) 5-325 MG per tablet Take 1 tablet by mouth every 6 (six) hours as needed for moderate pain.  70 tablet  0  . lisinopril (PRINIVIL,ZESTRIL) 20 MG tablet TAKE 1 TABLET BY MOUTH EVERY DAY  90 tablet  1  . omeprazole (PRILOSEC OTC) 20  MG tablet Take 2 tablets (40 mg total) by mouth daily.      . sertraline (ZOLOFT) 50 MG tablet TAKE 1 TABLET (50 MG TOTAL) BY MOUTH DAILY.  90 tablet  1  . simvastatin (ZOCOR) 20 MG tablet TAKE 1 TABLET BY MOUTH AT BEDTIME  90 tablet  1  . Syringe/Needle, Disp, (B-D ECLIPSE SYRINGE) 30G X 1/2" 1 ML MISC every 30 (thirty) days.         No current facility-administered medications for this visit.     Allergies:   Percodan   Social History:  The patient  reports that he has been smoking.  He has never used smokeless tobacco. He reports that he drinks alcohol. He reports that he does not use illicit drugs.   Family History:  The patient's family history includes Bone cancer in his mother; Colon polyps in his father and mother; Congestive Heart Failure in his father; Diabetes in his father; Heart attack in his brother and mother; Heart failure  in his father; Hypertension in his father; Other in his brother and brother; Squamous cell carcinoma in his mother; Stroke in his paternal grandfather; Sudden death in his brother.   ROS:  Please see the history of present illness.       All other systems reviewed and negative.    PHYSICAL EXAM: VS:  BP 148/80  Pulse 84  Ht 5\' 7"  (1.702 m)  Wt 170 lb (77.111 kg)  BMI 26.62 kg/m2 Well nourished, well developed, in no acute distress HEENT: normal Neck:  no JVD Cardiac:  normal S1, S2;  RRR; no murmur Lungs:   clear to auscultation bilaterally, no wheezing, rhonchi or rales Abd: soft, nontender, no hepatomegaly Ext:  no edema Skin: warm and dry Neuro:  CNs 2-12 intact, no focal abnormalities noted  EKG:  NSR, HR 84, normal axis, no ST changes      ASSESSMENT AND PLAN:  Preoperative cardiovascular examination-  He does not have any unstable cardiac conditions. He had a normal echocardiogram and normal stress test one year ago. I reviewed his case today with Dr. Johnsie Cancel (DOD). He does not require any further cardiac workup prior to his noncardiac surgery and should be at acceptable risk.   Essential hypertension -  Borderline elevated. Continue to monitor.   HLD (hyperlipidemia) -  Continue statin. Follow-up with primary care.   Disposition:   FU with Dr. Martinique as needed.   Signed, Versie Starks, MHS 05/19/2014 3:37 PM    [facility] [place], [place], [place]  [address] Phone: [phone]; Fax: [phone]

## 2014-05-20 ENCOUNTER — Telehealth: Payer: Self-pay | Admitting: Physician Assistant

## 2014-05-20 NOTE — Telephone Encounter (Signed)
Received request from Nurse fax box, documents faxed for surgical clearance. To: [facility] Fax number: [phone] Attention: 10.28.15/km

## 2014-05-22 ENCOUNTER — Encounter (HOSPITAL_COMMUNITY): Payer: Self-pay | Admitting: Pharmacy Technician

## 2014-05-27 ENCOUNTER — Encounter (HOSPITAL_COMMUNITY)
Admission: RE | Admit: 2014-05-27 | Discharge: 2014-05-27 | Disposition: A | Discharge status: Home / Self Care | Payer: Managed Care, Other (non HMO) | Source: Ambulatory Visit | Attending: Neurosurgery | Admitting: Neurosurgery

## 2014-05-27 ENCOUNTER — Encounter (HOSPITAL_COMMUNITY): Payer: Self-pay

## 2014-05-27 DIAGNOSIS — Z01812 Encounter for preprocedural laboratory examination: Secondary | ICD-10-CM | POA: Diagnosis not present

## 2014-05-27 DIAGNOSIS — M5126 Other intervertebral disc displacement, lumbar region: Secondary | ICD-10-CM | POA: Diagnosis not present

## 2014-05-27 LAB — COMPREHENSIVE METABOLIC PANEL
ALT: 42 U/L (ref 0–53)
AST: 37 U/L (ref 0–37)
Albumin: 4.2 g/dL (ref 3.5–5.2)
Alkaline Phosphatase: 90 U/L (ref 39–117)
Anion gap: 13 (ref 5–15)
BUN: 15 mg/dL (ref 6–23)
CO2: 24 meq/L (ref 19–32)
CREATININE: 1.44 mg/dL — AB (ref 0.50–1.35)
Calcium: 9.3 mg/dL (ref 8.4–10.5)
Chloride: 96 mEq/L (ref 96–112)
GFR, EST AFRICAN AMERICAN: 61 mL/min — AB (ref >90)
GFR, EST NON AFRICAN AMERICAN: 53 mL/min — AB (ref >90)
GLUCOSE: 79 mg/dL (ref 70–99)
Potassium: 4.1 mEq/L (ref 3.7–5.3)
Sodium: 133 mEq/L — ABNORMAL LOW (ref 137–147)
Total Protein: 7.2 g/dL (ref 6.0–8.3)

## 2014-05-27 LAB — CBC
HCT: 39.4 % (ref 39.0–52.0)
Hemoglobin: 13.1 g/dL (ref 13.0–17.0)
MCH: 30.7 pg (ref 26.0–34.0)
MCHC: 33.2 g/dL (ref 30.0–36.0)
MCV: 92.3 fL (ref 78.0–100.0)
PLATELETS: 242 10*3/uL (ref 150–400)
RBC: 4.27 MIL/uL (ref 4.22–5.81)
RDW: 13.1 % (ref 11.5–15.5)
WBC: 7.3 10*3/uL (ref 4.0–10.5)

## 2014-05-27 LAB — TYPE AND SCREEN
ABO/RH(D): O POS
ANTIBODY SCREEN: NEGATIVE

## 2014-05-27 LAB — SURGICAL PCR SCREEN
MRSA, PCR: NEGATIVE
STAPHYLOCOCCUS AUREUS: NEGATIVE

## 2014-05-27 LAB — ABO/RH: ABO/RH(D): O POS

## 2014-05-27 NOTE — Pre-Procedure Instructions (Signed)
Aaron Black  05/27/2014   Your procedure is scheduled on:  Friday, Nov 13  Report to [facility] Main Entrance "A" at 5:30 AM.  Call this number if you have problems the morning of surgery: [phone]               If any questions prior to surgery call pre-admission [phone]   Remember:   Do not eat food or drink liquids after midnight.   Take these medicines the morning of surgery with A SIP OF WATER: xanax, flexeril, diltiazem (cardizem), hydrocodone, prilosec (omeprazole)   Do not wear jewelry, make-up or nail polish.  Do not wear lotions, powders, or perfumes. You may wear deodorant.  Do not shave 48 hours prior to surgery. Men may shave face and neck.  Do not bring valuables to the hospital.  [facility] is not responsible                  for any belongings or valuables.               Contacts, dentures or bridgework may not be worn into surgery.  Leave suitcase in the car. After surgery it may be brought to your room.  For patients admitted to the hospital, discharge time is determined by your                treatment team.               Patients discharged the day of surgery will not be allowed to drive  home.  Name and phone number of your driver:   Special Instructions: review preparing for surgery handout   Please read over the following fact sheets that you were given: Pain Booklet, Coughing and Deep Breathing, Blood Transfusion Information, MRSA Information and Surgical Site Infection Prevention

## 2014-05-27 NOTE — Progress Notes (Signed)
   05/27/14 1011  OBSTRUCTIVE SLEEP APNEA  Have you ever been diagnosed with sleep apnea through a sleep study? No  Do you snore loudly (loud enough to be heard through closed doors)?  0  Do you often feel tired, fatigued, or sleepy during the daytime? 1  Has anyone observed you stop breathing during your sleep? 0  Do you have, or are you being treated for high blood pressure? 1  BMI more than 35 kg/m2? 0  Age over 56 years old? 1  Neck circumference greater than 40 cm/16 inches? 1  Gender: 1  Obstructive Sleep Apnea Score 5  Score 4 or greater  Results sent to PCP

## 2014-06-04 MED ORDER — CEFAZOLIN SODIUM-DEXTROSE 2-3 GM-% IV SOLR
2.0000 g | INTRAVENOUS | Status: AC
  Administered 2014-06-05 (×2): 2 g via INTRAVENOUS

## 2014-06-05 ENCOUNTER — Inpatient Hospital Stay (HOSPITAL_COMMUNITY): Payer: Managed Care, Other (non HMO) | Admitting: Certified Registered"

## 2014-06-05 ENCOUNTER — Encounter (HOSPITAL_COMMUNITY)
Admission: RE | Disposition: A | Discharge status: Home / Self Care | Payer: Self-pay | Source: Ambulatory Visit | Attending: Neurosurgery

## 2014-06-05 ENCOUNTER — Encounter (HOSPITAL_COMMUNITY): Payer: Self-pay | Admitting: *Deleted

## 2014-06-05 ENCOUNTER — Inpatient Hospital Stay (HOSPITAL_COMMUNITY)
Admission: RE | Admit: 2014-06-05 | Discharge: 2014-06-08 | DRG: 460 | Disposition: A | Discharge status: Home / Self Care | Payer: Managed Care, Other (non HMO) | Source: Ambulatory Visit | Attending: Neurosurgery | Admitting: Neurosurgery

## 2014-06-05 ENCOUNTER — Inpatient Hospital Stay (HOSPITAL_COMMUNITY): Payer: Managed Care, Other (non HMO)

## 2014-06-05 DIAGNOSIS — Z885 Allergy status to narcotic agent status: Secondary | ICD-10-CM

## 2014-06-05 DIAGNOSIS — M4316 Spondylolisthesis, lumbar region: Secondary | ICD-10-CM | POA: Diagnosis present

## 2014-06-05 DIAGNOSIS — M47896 Other spondylosis, lumbar region: Secondary | ICD-10-CM | POA: Diagnosis present

## 2014-06-05 DIAGNOSIS — K219 Gastro-esophageal reflux disease without esophagitis: Secondary | ICD-10-CM | POA: Diagnosis present

## 2014-06-05 DIAGNOSIS — G4730 Sleep apnea, unspecified: Secondary | ICD-10-CM | POA: Diagnosis present

## 2014-06-05 DIAGNOSIS — M4806 Spinal stenosis, lumbar region: Secondary | ICD-10-CM | POA: Diagnosis present

## 2014-06-05 DIAGNOSIS — M5136 Other intervertebral disc degeneration, lumbar region: Secondary | ICD-10-CM | POA: Diagnosis present

## 2014-06-05 DIAGNOSIS — M419 Scoliosis, unspecified: Secondary | ICD-10-CM | POA: Diagnosis present

## 2014-06-05 DIAGNOSIS — M4180 Other forms of scoliosis, site unspecified: Secondary | ICD-10-CM | POA: Diagnosis present

## 2014-06-05 DIAGNOSIS — Z886 Allergy status to analgesic agent status: Secondary | ICD-10-CM

## 2014-06-05 DIAGNOSIS — M47816 Spondylosis without myelopathy or radiculopathy, lumbar region: Secondary | ICD-10-CM | POA: Diagnosis present

## 2014-06-05 DIAGNOSIS — F17210 Nicotine dependence, cigarettes, uncomplicated: Secondary | ICD-10-CM | POA: Diagnosis present

## 2014-06-05 DIAGNOSIS — I10 Essential (primary) hypertension: Secondary | ICD-10-CM | POA: Diagnosis present

## 2014-06-05 HISTORY — PX: LUMBAR PERCUTANEOUS PEDICLE SCREW 4 LEVEL: SHX6318

## 2014-06-05 HISTORY — PX: ANTERIOR LATERAL LUMBAR FUSION 4 LEVELS: SHX5552

## 2014-06-05 LAB — POCT I-STAT 4, (NA,K, GLUC, HGB,HCT)
Glucose, Bld: 121 mg/dL — ABNORMAL HIGH (ref 70–99)
HCT: 35 % — ABNORMAL LOW (ref 39.0–52.0)
Hemoglobin: 11.9 g/dL — ABNORMAL LOW (ref 13.0–17.0)
Potassium: 4.6 mEq/L (ref 3.7–5.3)
SODIUM: 136 meq/L — AB (ref 137–147)

## 2014-06-05 SURGERY — ANTERIOR LATERAL LUMBAR FUSION 4 LEVELS
Anesthesia: General | Laterality: Right

## 2014-06-05 MED ORDER — COLCHICINE 0.6 MG PO TABS
0.6000 mg | ORAL_TABLET | Freq: Every day | ORAL | Status: DC | PRN
  Administered 2014-06-07 – 2014-06-08 (×2): 0.6 mg via ORAL
  Filled 2014-06-05 (×2): qty 1

## 2014-06-05 MED ORDER — SODIUM CHLORIDE 0.9 % IJ SOLN: 3.0000 mL | INTRAMUSCULAR | Status: DC | PRN

## 2014-06-05 MED ORDER — OXYCODONE-ACETAMINOPHEN 5-325 MG PO TABS
1.0000 | ORAL_TABLET | ORAL | Status: DC | PRN
  Administered 2014-06-05: 1 via ORAL
  Administered 2014-06-05 – 2014-06-06 (×3): 2 via ORAL
  Filled 2014-06-05 (×3): qty 2

## 2014-06-05 MED ORDER — PHENOL 1.4 % MT LIQD: 1.0000 | OROMUCOSAL | Status: DC | PRN

## 2014-06-05 MED ORDER — PROPOFOL 10 MG/ML IV BOLUS
INTRAVENOUS | Status: DC | PRN
  Administered 2014-06-05: 30 mg via INTRAVENOUS
  Administered 2014-06-05: 200 mg via INTRAVENOUS

## 2014-06-05 MED ORDER — HYDROCODONE-ACETAMINOPHEN 5-325 MG PO TABS: 1.0000 | ORAL_TABLET | Freq: Four times a day (QID) | ORAL | Status: DC | PRN

## 2014-06-05 MED ORDER — HEMOSTATIC AGENTS (NO CHARGE) OPTIME
TOPICAL | Status: DC | PRN
  Administered 2014-06-05: 1 via TOPICAL

## 2014-06-05 MED ORDER — LACTATED RINGERS IV SOLN
INTRAVENOUS | Status: DC | PRN
  Administered 2014-06-05 (×3): via INTRAVENOUS

## 2014-06-05 MED ORDER — MIDAZOLAM HCL 2 MG/2ML IJ SOLN
INTRAMUSCULAR | Status: AC
  Filled 2014-06-05: qty 2

## 2014-06-05 MED ORDER — ALPRAZOLAM 0.5 MG PO TABS
0.5000 mg | ORAL_TABLET | Freq: Three times a day (TID) | ORAL | Status: DC | PRN
  Administered 2014-06-06 – 2014-06-07 (×2): 0.5 mg via ORAL
  Filled 2014-06-05 (×2): qty 1

## 2014-06-05 MED ORDER — SIMVASTATIN 20 MG PO TABS
20.0000 mg | ORAL_TABLET | Freq: Every day | ORAL | Status: DC
  Administered 2014-06-05 – 2014-06-07 (×3): 20 mg via ORAL
  Filled 2014-06-05 (×3): qty 1

## 2014-06-05 MED ORDER — ALUM & MAG HYDROXIDE-SIMETH 200-200-20 MG/5ML PO SUSP
30.0000 mL | Freq: Four times a day (QID) | ORAL | Status: DC | PRN
  Administered 2014-06-06: 30 mL via ORAL
  Filled 2014-06-05: qty 30

## 2014-06-05 MED ORDER — DILTIAZEM HCL ER 240 MG PO CP24: 240.0000 mg | ORAL_CAPSULE | Freq: Every day | ORAL | Status: DC

## 2014-06-05 MED ORDER — NEOSTIGMINE METHYLSULFATE 10 MG/10ML IV SOLN
INTRAVENOUS | Status: AC
  Filled 2014-06-05: qty 1

## 2014-06-05 MED ORDER — SERTRALINE HCL 50 MG PO TABS
50.0000 mg | ORAL_TABLET | Freq: Every day | ORAL | Status: DC
  Administered 2014-06-05 – 2014-06-08 (×4): 50 mg via ORAL
  Filled 2014-06-05 (×4): qty 1

## 2014-06-05 MED ORDER — LIDOCAINE-EPINEPHRINE 1 %-1:100000 IJ SOLN
INTRAMUSCULAR | Status: DC | PRN
  Administered 2014-06-05: 7.5 mL
  Administered 2014-06-05: 15 mL

## 2014-06-05 MED ORDER — ALBUMIN HUMAN 5 % IV SOLN
INTRAVENOUS | Status: DC | PRN
  Administered 2014-06-05 (×2): via INTRAVENOUS

## 2014-06-05 MED ORDER — FENTANYL CITRATE 0.05 MG/ML IJ SOLN
INTRAMUSCULAR | Status: AC
  Filled 2014-06-05: qty 5

## 2014-06-05 MED ORDER — SODIUM CHLORIDE 0.9 % IV SOLN
10.0000 mg | INTRAVENOUS | Status: DC | PRN
  Administered 2014-06-05: 10 ug/min via INTRAVENOUS

## 2014-06-05 MED ORDER — KCL IN DEXTROSE-NACL 20-5-0.45 MEQ/L-%-% IV SOLN
INTRAVENOUS | Status: DC
  Administered 2014-06-05 – 2014-06-06 (×2): via INTRAVENOUS
  Filled 2014-06-05 (×2): qty 1000

## 2014-06-05 MED ORDER — FLEET ENEMA 7-19 GM/118ML RE ENEM: 1.0000 | ENEMA | Freq: Once | RECTAL | Status: AC | PRN

## 2014-06-05 MED ORDER — BUPIVACAINE HCL (PF) 0.5 % IJ SOLN
INTRAMUSCULAR | Status: DC | PRN
  Administered 2014-06-05: 15 mL
  Administered 2014-06-05: 7.5 mL

## 2014-06-05 MED ORDER — METHOCARBAMOL 1000 MG/10ML IJ SOLN
500.0000 mg | Freq: Four times a day (QID) | INTRAVENOUS | Status: DC | PRN
  Administered 2014-06-05: 500 mg via INTRAVENOUS
  Filled 2014-06-05 (×2): qty 5

## 2014-06-05 MED ORDER — SODIUM CHLORIDE 0.9 % IJ SOLN
3.0000 mL | Freq: Two times a day (BID) | INTRAMUSCULAR | Status: DC
Start: 2014-06-05 — End: 2014-06-08
  Administered 2014-06-05 – 2014-06-06 (×2): 3 mL via INTRAVENOUS
  Administered 2014-06-06: 10:00:00 via INTRAVENOUS
  Administered 2014-06-07 (×2): 3 mL via INTRAVENOUS

## 2014-06-05 MED ORDER — POLYETHYLENE GLYCOL 3350 17 G PO PACK
17.0000 g | PACK | Freq: Every day | ORAL | Status: DC | PRN
  Filled 2014-06-05: qty 1

## 2014-06-05 MED ORDER — SUCCINYLCHOLINE CHLORIDE 20 MG/ML IJ SOLN
INTRAMUSCULAR | Status: DC | PRN
  Administered 2014-06-05: 100 mg via INTRAVENOUS

## 2014-06-05 MED ORDER — OXYCODONE-ACETAMINOPHEN 5-325 MG PO TABS
ORAL_TABLET | ORAL | Status: AC
  Filled 2014-06-05: qty 1

## 2014-06-05 MED ORDER — PROPOFOL 10 MG/ML IV BOLUS
INTRAVENOUS | Status: AC
  Filled 2014-06-05: qty 20

## 2014-06-05 MED ORDER — ROCURONIUM BROMIDE 50 MG/5ML IV SOLN
INTRAVENOUS | Status: AC
  Filled 2014-06-05: qty 1

## 2014-06-05 MED ORDER — MENTHOL 3 MG MT LOZG: 1.0000 | LOZENGE | OROMUCOSAL | Status: DC | PRN

## 2014-06-05 MED ORDER — CYCLOBENZAPRINE HCL 10 MG PO TABS
ORAL_TABLET | ORAL | Status: AC
  Filled 2014-06-05: qty 1

## 2014-06-05 MED ORDER — CEFAZOLIN SODIUM 1-5 GM-% IV SOLN
1.0000 g | Freq: Three times a day (TID) | INTRAVENOUS | Status: AC
  Administered 2014-06-05 – 2014-06-06 (×2): 1 g via INTRAVENOUS
  Filled 2014-06-05 (×2): qty 50

## 2014-06-05 MED ORDER — ALBUTEROL SULFATE HFA 108 (90 BASE) MCG/ACT IN AERS
INHALATION_SPRAY | RESPIRATORY_TRACT | Status: DC | PRN
  Administered 2014-06-05: 4 via RESPIRATORY_TRACT

## 2014-06-05 MED ORDER — ZOLPIDEM TARTRATE 5 MG PO TABS
5.0000 mg | ORAL_TABLET | Freq: Every evening | ORAL | Status: DC | PRN
  Administered 2014-06-06: 5 mg via ORAL
  Filled 2014-06-05: qty 1

## 2014-06-05 MED ORDER — DOCUSATE SODIUM 100 MG PO CAPS
100.0000 mg | ORAL_CAPSULE | Freq: Two times a day (BID) | ORAL | Status: DC
  Administered 2014-06-05 – 2014-06-07 (×4): 100 mg via ORAL
  Filled 2014-06-05 (×4): qty 1

## 2014-06-05 MED ORDER — ONDANSETRON HCL 4 MG/2ML IJ SOLN
4.0000 mg | INTRAMUSCULAR | Status: DC | PRN
  Administered 2014-06-06: 4 mg via INTRAVENOUS
  Filled 2014-06-05: qty 2

## 2014-06-05 MED ORDER — DILTIAZEM HCL ER COATED BEADS 240 MG PO CP24
240.0000 mg | ORAL_CAPSULE | Freq: Every day | ORAL | Status: DC
  Administered 2014-06-06 – 2014-06-08 (×3): 240 mg via ORAL
  Filled 2014-06-05 (×3): qty 1

## 2014-06-05 MED ORDER — ONDANSETRON HCL 4 MG/2ML IJ SOLN
INTRAMUSCULAR | Status: AC
  Filled 2014-06-05: qty 2

## 2014-06-05 MED ORDER — SUFENTANIL CITRATE 50 MCG/ML IV SOLN
INTRAVENOUS | Status: AC
  Filled 2014-06-05: qty 1

## 2014-06-05 MED ORDER — SENNA 8.6 MG PO TABS
1.0000 | ORAL_TABLET | Freq: Two times a day (BID) | ORAL | Status: DC
  Administered 2014-06-05 – 2014-06-08 (×6): 8.6 mg via ORAL
  Filled 2014-06-05 (×5): qty 1

## 2014-06-05 MED ORDER — SODIUM CHLORIDE 0.9 % IV SOLN: 250.0000 mL | INTRAVENOUS | Status: DC

## 2014-06-05 MED ORDER — PANTOPRAZOLE SODIUM 40 MG IV SOLR
40.0000 mg | Freq: Every day | INTRAVENOUS | Status: DC
  Administered 2014-06-05 – 2014-06-06 (×2): 40 mg via INTRAVENOUS
  Filled 2014-06-05 (×2): qty 40

## 2014-06-05 MED ORDER — LIDOCAINE HCL (CARDIAC) 20 MG/ML IV SOLN
INTRAVENOUS | Status: DC | PRN
  Administered 2014-06-05: 180 mg via INTRAVENOUS

## 2014-06-05 MED ORDER — OMEPRAZOLE MAGNESIUM 20 MG PO TBEC: 40.0000 mg | DELAYED_RELEASE_TABLET | Freq: Every day | ORAL | Status: DC

## 2014-06-05 MED ORDER — ACETAMINOPHEN 650 MG RE SUPP: 650.0000 mg | RECTAL | Status: DC | PRN

## 2014-06-05 MED ORDER — HYDROMORPHONE HCL 1 MG/ML IJ SOLN
0.5000 mg | INTRAMUSCULAR | Status: DC | PRN
  Administered 2014-06-05 – 2014-06-06 (×5): 1 mg via INTRAVENOUS
  Filled 2014-06-05 (×6): qty 1

## 2014-06-05 MED ORDER — ACETAMINOPHEN 325 MG PO TABS: 650.0000 mg | ORAL_TABLET | ORAL | Status: DC | PRN

## 2014-06-05 MED ORDER — BISACODYL 10 MG RE SUPP: 10.0000 mg | Freq: Every day | RECTAL | Status: DC | PRN

## 2014-06-05 MED ORDER — ONDANSETRON HCL 4 MG/2ML IJ SOLN
INTRAMUSCULAR | Status: DC | PRN
  Administered 2014-06-05: 4 mg via INTRAVENOUS

## 2014-06-05 MED ORDER — CYCLOBENZAPRINE HCL 10 MG PO TABS
10.0000 mg | ORAL_TABLET | Freq: Two times a day (BID) | ORAL | Status: DC
  Administered 2014-06-05 – 2014-06-08 (×7): 10 mg via ORAL
  Filled 2014-06-05 (×7): qty 1

## 2014-06-05 MED ORDER — SUFENTANIL CITRATE 50 MCG/ML IV SOLN
INTRAVENOUS | Status: DC | PRN
  Administered 2014-06-05: 5 ug via INTRAVENOUS
  Administered 2014-06-05: 10 ug via INTRAVENOUS
  Administered 2014-06-05: 5 ug via INTRAVENOUS
  Administered 2014-06-05: 10 ug via INTRAVENOUS
  Administered 2014-06-05 (×4): 5 ug via INTRAVENOUS

## 2014-06-05 MED ORDER — LIDOCAINE HCL (CARDIAC) 20 MG/ML IV SOLN
INTRAVENOUS | Status: AC
  Filled 2014-06-05: qty 5

## 2014-06-05 MED ORDER — FENTANYL CITRATE 0.05 MG/ML IJ SOLN
INTRAMUSCULAR | Status: DC | PRN
  Administered 2014-06-05 (×3): 50 ug via INTRAVENOUS
  Administered 2014-06-05 (×4): 100 ug via INTRAVENOUS

## 2014-06-05 MED ORDER — FENTANYL CITRATE 0.05 MG/ML IJ SOLN
INTRAMUSCULAR | Status: AC
  Filled 2014-06-05: qty 2

## 2014-06-05 MED ORDER — LISINOPRIL 20 MG PO TABS
20.0000 mg | ORAL_TABLET | Freq: Every day | ORAL | Status: DC
  Administered 2014-06-06 – 2014-06-08 (×3): 20 mg via ORAL
  Filled 2014-06-05 (×3): qty 1

## 2014-06-05 MED ORDER — MIDAZOLAM HCL 5 MG/5ML IJ SOLN
INTRAMUSCULAR | Status: DC | PRN
  Administered 2014-06-05: 1 mg via INTRAVENOUS
  Administered 2014-06-05: 2 mg via INTRAVENOUS
  Administered 2014-06-05: 1 mg via INTRAVENOUS

## 2014-06-05 MED ORDER — HYDROCODONE-ACETAMINOPHEN 5-325 MG PO TABS: 1.0000 | ORAL_TABLET | ORAL | Status: DC | PRN

## 2014-06-05 MED ORDER — METHOCARBAMOL 500 MG PO TABS
500.0000 mg | ORAL_TABLET | Freq: Four times a day (QID) | ORAL | Status: DC | PRN
  Administered 2014-06-06: 500 mg via ORAL
  Filled 2014-06-05 (×2): qty 1

## 2014-06-05 MED ORDER — FENTANYL CITRATE 0.05 MG/ML IJ SOLN
25.0000 ug | INTRAMUSCULAR | Status: DC | PRN
  Administered 2014-06-05 (×3): 50 ug via INTRAVENOUS

## 2014-06-05 MED ORDER — GLYCOPYRROLATE 0.2 MG/ML IJ SOLN
INTRAMUSCULAR | Status: AC
  Filled 2014-06-05: qty 3

## 2014-06-05 MED ORDER — 0.9 % SODIUM CHLORIDE (POUR BTL) OPTIME
TOPICAL | Status: DC | PRN
  Administered 2014-06-05: 1000 mL

## 2014-06-05 MED ORDER — THROMBIN 5000 UNITS EX SOLR
CUTANEOUS | Status: DC | PRN
  Administered 2014-06-05 (×2): 5000 [IU] via TOPICAL

## 2014-06-05 SURGICAL SUPPLY — 88 items
APL SKNCLS STERI-STRIP NONHPOA (GAUZE/BANDAGES/DRESSINGS) ×2
BENZOIN TINCTURE PRP APPL 2/3 (GAUZE/BANDAGES/DRESSINGS) ×3 IMPLANT
BLADE CLIPPER SURG (BLADE) IMPLANT
BONE MATRIX OSTEOCEL PRO MED (Bone Implant) ×4 IMPLANT
CLIP NEUROVISION LG (CLIP) ×1 IMPLANT
CONT SPEC 4OZ CLIKSEAL STRL BL (MISCELLANEOUS) ×6 IMPLANT
CORENT WIDE 10X22X55 (Orthopedic Implant) ×6 IMPLANT
COROENT WIDE 10X22X55 (Orthopedic Implant) IMPLANT
COROENT X/W 10X22X60 (Trauma) ×1 IMPLANT
COVER BACK TABLE 24X17X13 BIG (DRAPES) IMPLANT
COVER BACK TABLE 60X90IN (DRAPES) ×3 IMPLANT
DECANTER SPIKE VIAL GLASS SM (MISCELLANEOUS) ×6 IMPLANT
DRAPE C-ARM 42X72 X-RAY (DRAPES) ×6 IMPLANT
DRAPE C-ARMOR (DRAPES) ×6 IMPLANT
DRAPE LAPAROTOMY 100X72X124 (DRAPES) ×6 IMPLANT
DRAPE POUCH INSTRU U-SHP 10X18 (DRAPES) ×6 IMPLANT
DRAPE SURG 17X23 STRL (DRAPES) ×3 IMPLANT
DRSG OPSITE POSTOP 3X4 (GAUZE/BANDAGES/DRESSINGS) ×2 IMPLANT
DRSG OPSITE POSTOP 4X8 (GAUZE/BANDAGES/DRESSINGS) ×2 IMPLANT
DRSG TELFA 3X8 NADH (GAUZE/BANDAGES/DRESSINGS) ×3 IMPLANT
DURAPREP 26ML APPLICATOR (WOUND CARE) ×6 IMPLANT
ELECT REM PT RETURN 9FT ADLT (ELECTROSURGICAL) ×6
ELECTRODE REM PT RTRN 9FT ADLT (ELECTROSURGICAL) ×4 IMPLANT
GAUZE SPONGE 4X4 12PLY STRL (GAUZE/BANDAGES/DRESSINGS) ×3 IMPLANT
GAUZE SPONGE 4X4 16PLY XRAY LF (GAUZE/BANDAGES/DRESSINGS) ×1 IMPLANT
GLOVE BIO SURGEON STRL SZ8 (GLOVE) ×9 IMPLANT
GLOVE BIOGEL PI IND STRL 8 (GLOVE) ×4 IMPLANT
GLOVE BIOGEL PI IND STRL 8.5 (GLOVE) ×6 IMPLANT
GLOVE BIOGEL PI INDICATOR 8 (GLOVE) ×2
GLOVE BIOGEL PI INDICATOR 8.5 (GLOVE) ×3
GLOVE ECLIPSE 8.0 STRL XLNG CF (GLOVE) ×6 IMPLANT
GLOVE EXAM NITRILE LRG STRL (GLOVE) IMPLANT
GLOVE EXAM NITRILE MD LF STRL (GLOVE) IMPLANT
GLOVE EXAM NITRILE XL STR (GLOVE) IMPLANT
GLOVE EXAM NITRILE XS STR PU (GLOVE) IMPLANT
GLOVE INDICATOR 7.0 STRL GRN (GLOVE) ×2 IMPLANT
GLOVE SURG SS PI 7.0 STRL IVOR (GLOVE) ×4 IMPLANT
GOWN STRL REUS W/ TWL LRG LVL3 (GOWN DISPOSABLE) IMPLANT
GOWN STRL REUS W/ TWL XL LVL3 (GOWN DISPOSABLE) ×6 IMPLANT
GOWN STRL REUS W/TWL 2XL LVL3 (GOWN DISPOSABLE) IMPLANT
GOWN STRL REUS W/TWL LRG LVL3 (GOWN DISPOSABLE)
GOWN STRL REUS W/TWL XL LVL3 (GOWN DISPOSABLE) ×15
GUIDEWIRE NITINOL BEVEL TIP (WIRE) ×10 IMPLANT
IMPL COROENT XL 8X18X50 (Intraocular Lens) IMPLANT
IMPLANT COROENT XL 8X18X50 (Intraocular Lens) ×3 IMPLANT
KIT BASIN OR (CUSTOM PROCEDURE TRAY) ×6 IMPLANT
KIT DILATOR XLIF 5 (KITS) IMPLANT
KIT MAXCESS (KITS) ×1 IMPLANT
KIT NDL NVM5 EMG ELECT (KITS) IMPLANT
KIT NEEDLE NVM5 EMG ELECT (KITS) ×2 IMPLANT
KIT NEEDLE NVM5 EMG ELECTRODE (KITS) ×1
KIT POSITION SURG JACKSON T1 (MISCELLANEOUS) ×3 IMPLANT
KIT ROOM TURNOVER OR (KITS) ×6 IMPLANT
KIT SURGICAL ACCESS MAXCESS 4 (KITS) ×1 IMPLANT
KIT XLIF (KITS) ×1
LIQUID BAND (GAUZE/BANDAGES/DRESSINGS) ×9 IMPLANT
MARKER SKIN DUAL TIP RULER LAB (MISCELLANEOUS) ×3 IMPLANT
NDL HYPO 25X1 1.5 SAFETY (NEEDLE) ×4 IMPLANT
NDL I PASS (NEEDLE) IMPLANT
NEEDLE HYPO 25X1 1.5 SAFETY (NEEDLE) ×6 IMPLANT
NEEDLE I PASS (NEEDLE) ×3 IMPLANT
NS IRRIG 1000ML POUR BTL (IV SOLUTION) ×6 IMPLANT
PACK LAMINECTOMY NEURO (CUSTOM PROCEDURE TRAY) ×6 IMPLANT
PAD ARMBOARD 7.5X6 YLW CONV (MISCELLANEOUS) ×9 IMPLANT
PAD DRESSING TELFA 3X8 NADH (GAUZE/BANDAGES/DRESSINGS) ×2 IMPLANT
PATTIES SURGICAL .5 X.5 (GAUZE/BANDAGES/DRESSINGS) IMPLANT
PATTIES SURGICAL .5 X1 (DISPOSABLE) IMPLANT
PATTIES SURGICAL 1X1 (DISPOSABLE) IMPLANT
ROD RELINE LORDOTIC 5.5X140 (Rod) ×1 IMPLANT
ROD RELINE MAS LORD 5.5X120MM (Rod) ×1 IMPLANT
SCREW LOCK RELINE 5.5 TULIP (Screw) ×10 IMPLANT
SCREW MAS RELINE 6.5X45 POLY (Screw) ×9 IMPLANT
SCREW MAS RELINE 6.5X50 POLY (Screw) ×1 IMPLANT
SPONGE LAP 4X18 X RAY DECT (DISPOSABLE) IMPLANT
SPONGE SURGIFOAM ABS GEL SZ50 (HEMOSTASIS) ×1 IMPLANT
STAPLER SKIN PROX WIDE 3.9 (STAPLE) ×3 IMPLANT
STRIP CLOSURE SKIN 1/2X4 (GAUZE/BANDAGES/DRESSINGS) ×3 IMPLANT
SUT VIC AB 1 CT1 18XBRD ANBCTR (SUTURE) ×6 IMPLANT
SUT VIC AB 1 CT1 8-18 (SUTURE) ×9
SUT VIC AB 2-0 CT1 18 (SUTURE) ×9 IMPLANT
SUT VIC AB 3-0 SH 8-18 (SUTURE) ×9 IMPLANT
SYR 20ML ECCENTRIC (SYRINGE) ×6 IMPLANT
SYR INSULIN 1ML 31GX6 SAFETY (SYRINGE) IMPLANT
TAPE CLOTH 3X10 TAN LF (GAUZE/BANDAGES/DRESSINGS) ×9 IMPLANT
TOWEL OR 17X24 6PK STRL BLUE (TOWEL DISPOSABLE) ×6 IMPLANT
TOWEL OR 17X26 10 PK STRL BLUE (TOWEL DISPOSABLE) ×6 IMPLANT
TRAY FOLEY CATH 14FRSI W/METER (CATHETERS) ×3 IMPLANT
WATER STERILE IRR 1000ML POUR (IV SOLUTION) ×6 IMPLANT

## 2014-06-05 NOTE — Anesthesia Postprocedure Evaluation (Signed)
  Anesthesia Post-op Note  Patient: SHAVON ZENZ  Procedure(s) Performed: Procedure(s): ANTERIOR LATERAL LUMBAR FUSION  LUMBAR ONE TO LUMBAR FIVE with Percutaneous Pedicle Screws (Right) LUMBAR PERCUTANEOUS PEDICLE SCREW 4 LEVEL (N/A)  Patient Location: PACU  Anesthesia Type:General  Level of Consciousness: awake, alert  and oriented  Airway and Oxygen Therapy: Patient Spontanous Breathing and Patient connected to nasal cannula oxygen  Post-op Pain: mild  Post-op Assessment: Post-op Vital signs reviewed, Patient's Cardiovascular Status Stable, Respiratory Function Stable, Patent Airway and No signs of Nausea or vomiting  Post-op Vital Signs: stable  Last Vitals:  Filed Vitals:   06/05/14 1738  BP: 150/91  Pulse: 88  Temp: 36.7 C  Resp: 18    Complications: No apparent anesthesia complications

## 2014-06-05 NOTE — Anesthesia Procedure Notes (Signed)
Procedure Name: Intubation Date/Time: 06/05/2014 7:42 AM Performed by: Octavio Graves Pre-anesthesia Checklist: Patient identified, Timeout performed, Emergency Drugs available, Suction available and Patient being monitored Patient Re-evaluated:Patient Re-evaluated prior to inductionOxygen Delivery Method: Circle system utilized Preoxygenation: Pre-oxygenation with 100% oxygen Intubation Type: IV induction Ventilation: Mask ventilation without difficulty Laryngoscope Size: Miller and 2 Grade View: Grade I Tube type: Oral Tube size: 7.5 mm Number of attempts: 1 Airway Equipment and Method: Stylet Placement Confirmation: ETT inserted through vocal cords under direct vision,  breath sounds checked- equal and bilateral and positive ETCO2 Secured at: 22 cm Tube secured with: Tape Dental Injury: Teeth and Oropharynx as per pre-operative assessment  Comments: IV induction Edwards- intubation AM CRNA atraumatic teeth and mouth as pre op- bilat BS Edwards- Albuterol as per Edwards- 4 puffs via ETT

## 2014-06-05 NOTE — Progress Notes (Signed)
Patient admitted via PACU.Patient alert and oriented x 4. Patient oriented to the room. Will continue to monitor

## 2014-06-05 NOTE — H&P (Signed)
[place], [place] [address] Phone: [phone]   Patient ID:   [id number] Patient: Aaron Black  Date of Birth: 1955-08-30 Visit Type: Office Visit   Date: 04/27/2014 01:30 PM Provider: Marchia Meiers. Vertell Limber MD   This 58 year old male presents for Follow Up of back pain.  History of Present Illness: 1.  Follow Up of back pain  Pt returns to review MRI  MRI on [place]  Patient comes in today and says he "can't hardly walk" he complains of pain in his right lower hip and says it previously was on his left leg.  His MRI was reviewed with him and his wife today.  This shows extensive degenerative changes L1-2, L2-3, L3 L4, L4 L5 levels.  He has disc herniations at these levels as well.  I once again advised him about the importance of stopping smoking.  He says he currently has some legal matters to attend to.  He is going to do so and will follow up with me when he wishes to proceed with surgery and this will likely consist of decompression and fusion L1 through L5 levels with XLIFand pedicle screw fixation      Medical/Surgical/Interim History Reviewed, no change.  Last detailed document date:07/07/2013.   Family History: Reviewed, no changes.  Last detailed document: 07/07/2013.  Patient reports there is no relevant family history.   Social History: Tobacco use reviewed. Reviewed, no changes. Last detailed document date: 07/07/2013.      MEDICATIONS(added, continued or stopped this visit):   Started Medication Directions Instruction Stopped   diltiazem CD 240 mg capsule,extended release 24 hr take 1 capsule by oral route  every day     Flexeril 10 mg tablet take 1 tablet by oral route 3 times every day    09/08/2013 hydrocodone 10 mg-acetaminophen 325 mg tablet take 1 tablet by oral route  every 6 - 8 hours as needed for pain     hydrocodone 5 mg-acetaminophen 325 mg tablet take 1 tablet by oral route  every 6 hours as needed for pain      lisinopril 20 mg tablet take 1 tablet by oral route  every day     omeprazole 20 mg capsule,delayed release take 1 capsule by oral route  every day 30 minutes to 1 hour before a meal    04/08/2014 Valium 10 mg tablet ake 1 tab prior to MRI     voltaren 75mg  ORAL      Xanax 0.5 mg tablet take 1 tablet by oral route 3 times every day    09/08/2013 z-pak  ORAL Take as directed     Zocor 20 mg tablet take 1 tablet by oral route  every day in the evening      ALLERGIES:  Ingredient Reaction Medication Name Comment  OXYCODONE TEREPHTHALATE Rash Percodan   ASPIRIN Rash Percodan   OXYCODONE HCL Rash Percodan   Reviewed, no changes.    Vitals Date Temp F BP Pulse Ht In Wt Lb BMI BSA Pain Score  04/27/2014  129/79 80 67 171 26.78  6/10      DIAGNOSTIC RESULTS Diagnostic report text  CLINICAL DATA: Low back pain for 1 month radiating down the left hip and leg. Right hip pain for 2 days. Left leg weakness. Back surgery in December, 2014.  EXAM: MRI LUMBAR SPINE WITHOUT AND WITH CONTRAST  TECHNIQUE: Multiplanar and multiecho pulse sequences of the lumbar spine were obtained without and with intravenous contrast.  CONTRAST: 17 cc MultiHance  COMPARISON: 08/30/2013  FINDINGS: The lowest lumbar type non-rib-bearing vertebra is labeled as L5. The conus medullaris appears normal. Conus level: L1.  Degenerative 3 mm retrolisthesis at L1-2.  Increased edema signal and enhancement along the endplates at X4-1 and O8-7 compared to prior without intervertebral disc high T2 signal or enhancement to suggest discitis. This may reflect progressive degenerative endplate disease. Loss of disc height noted once again at each level between L1 and L4.  Additional findings at individual levels are as follows:  T12-L1: No impingement. Mild disc bulge.  L1-2: Moderate left and mild right foraminal stenosis with mild bilateral subarticular lateral recess stenosis and mild displacement of both  L1 nerves in the lateral extraforaminal space due to disc osteophyte complex and facet arthropathy. Foraminal impingement mildly worsened in the antrum.  L2-3: Prominent right and borderline left foraminal stenosis with prominent right eccentric central narrowing of the thecal sac and moderate right subarticular lateral recess stenosis due to broad right lateral recess and foraminal disc protrusion, intervertebral spurring, facet spurring, and ligamentum flavum redundancy. Findings at this level have minimally worsened in the interim. Synovial cyst extending posteriorly from the right facet joint, not in a position to cause impingement.  L3-4: Prominent left and moderate right foraminal stenosis due to intervertebral spurring, disc bulge, left foraminal disc protrusion (worsened), and facet arthropathy. Postoperative findings on the right from hemilaminectomy and partial facetectomy. Synovial cyst extending posteriorly from the left facet joint, not in a position to cause impingement.  L4-5: Moderate to prominent bilateral foraminal stenosis, mildly worsened, with stable left subarticular lateral recess stenosis due to disc bulge, left lateral recess disc protrusion, and facet and intervertebral spurring.  L5-S1: Unremarkable.  IMPRESSION: 1. Mildly progressive lumbar spondylosis and degenerative disc disease common no resulting in prominent impingement at L2-3 and L3-4; moderate to prominent impingement at L4-5; and moderate impingement at L1-2, as detailed above. 2. Increased degenerative endplate findings at O6-7 and L3-4. Although there is some enhancement along the endplates, there is no increased signal in the adjacent discs to suggest diskitis-osteomyelitis.   Electronically Signed By: Sherryl Barters M.D. On: 04/15/2014 12:12    IMPRESSION Progressive back and leg pain with marked degeneration L1-2, L2-3, L3-4, L4 L5 levels.  Patient has been advised to stop  smoking.  He wishes to proceed with surgery but is currently involved in legal matters.  Completed Orders (this encounter) Order Details Reason Side Interpretation Result Initial Treatment Date Region  Lifestyle education regarding diet Encouraged to eat a well balanced diet and follow up with primary care physician.        Lumbar Spine- AP/Lat/Flex/Ex      04/27/2014    Assessment/Plan # Detail Type Description   1. Assessment Displacement of lumbar intervertebral disc without myelopathy (M51.26).       2. Assessment Lumbar radiculopathy (M54.16).       3. Assessment Scoliosis (and kyphoscoliosis), idiopathic (M41.20).       4. Assessment Spinal stenosis of lumbar region (M48.06).       5. Assessment Spondylolisthesis, lumbar region (M43.16).       6. Assessment Body mass index (BMI) 26.0-26.9, adult (E72.09).   Plan Orders Today's instructions / counseling include(s) Lifestyle education regarding diet.         Pain Assessment/Treatment Pain Scale: 6/10. Method: Numeric Pain Intensity Scale. Location: hips/right leg. Onset: 11/21/2013. Duration: varies. Quality: discomforting. Pain Assessment/Treatment follow-up plan of care: Patient is currently taking medication for pain as  prescribed..  Follow-up with me when he wishes to proceed with surgery.  Orders: Diagnostic Procedures: Assessment Procedure  M41.20 Lumbar Spine- AP/Lat/Flex/Ex  M41.20 PLIF - L2-L3 - L3-L4 - L4-L5  M54.16 Lumbar Spine- AP/Lat/Flex/Ex  Instruction(s)/Education: Assessment Instruction  [phone] Lifestyle education regarding diet             Provider:  Marchia Meiers. Vertell Limber MD  05/10/2014 04:37 PM Dictation edited by: Marchia Meiers. Vertell Limber    CC Providers: [place],  [place]  [id number]-   Juanita Craver [facility] [address] [place], [place]  [address]- ----------------------------------------------------------------------------------------------------------------------------------------------------------------------         Electronically signed by Marchia Meiers. Vertell Limber MD on 05/10/2014 04:37 PM  > [address] [place], [place] [address] Phone: [phone]   Patient ID:   [id number] Patient: Robert Sperl  Date of Birth: March 15, 1956 Visit Type: Office Visit   Date: 05/27/2014 09:00 AM Provider: Marchia Meiers. Vertell Limber MD   This 57 year old male presents for Low back pain.  History of Present Illness: 1.  Low back pain  Pt visits to be fitted for his LSO         MEDICATIONS(added, continued or stopped this visit):   Started Medication Directions Instruction Stopped   diltiazem CD 240 mg capsule,extended release 24 hr take 1 capsule by oral route  every day     Flexeril 10 mg tablet take 1 tablet by oral route 3 times every day    09/08/2013 hydrocodone 10 mg-acetaminophen 325 mg tablet take 1 tablet by oral route  every 6 - 8 hours as needed for pain     hydrocodone 5 mg-acetaminophen 325 mg tablet take 1 tablet by oral route  every 6 hours as needed for pain     lisinopril 20 mg tablet take 1 tablet by oral route  every day     omeprazole 20 mg capsule,delayed release take 1 capsule by oral route  every day 30 minutes to 1 hour before a meal    04/08/2014 Valium 10 mg tablet ake 1 tab prior to MRI     voltaren 75mg  ORAL      Xanax 0.5 mg tablet take 1 tablet by oral route 3 times every day    09/08/2013 z-pak  ORAL Take as directed     Zocor 20 mg tablet take 1 tablet by oral route  every day in the evening      ALLERGIES:  Ingredient Reaction Medication Name Comment  ASPIRIN Rash Percodan   OXYCODONE HCL Rash Percodan   OXYCODONE TEREPHTHALATE Rash Percodan          IMPRESSION Mr. Livingood is in good spirits, verbalizing understanding of his planned surgery.    Assessment/Plan # Detail  Type Description   1. Assessment Lumbar radiculopathy (M54.16).   Plan Orders LSO Brace fitted today    .         Pt is fitted for a [employer] LSO with small rigid anterior panel [phone]).  He agrees to bring the LSO to [facility] on the 13th when he arrives for surgery.  Right side XLIF L1-5 planned for Nov 13th at [facility].  Orders: Office Procedures/Services: Assessment Service Comments  M54.16 LSO Brace fitted today               Provider:  Marchia Meiers. Vertell Limber MD  05/27/2014 09:48 AM Dictation edited by: Mike Craze. Poteat  CC Providers: [facility],  [place]  [id number]-   Juanita Craver [facility] [address] [place], [place] [address]- ----------------------------------------------------------------------------------------------------------------------------------------------------------------------         Electronically signed by Marchia Meiers. Vertell Limber MD on 06/05/2014 07:25 AM

## 2014-06-05 NOTE — Brief Op Note (Signed)
06/05/2014  2:01 PM  PATIENT:  Aaron Black  58 y.o. male  PRE-OPERATIVE DIAGNOSIS:  SCOLIOSIS AND KYPHOSCOLIOSIS/STENOSIS/HNP WITHOUT MYELOPATHY/SPONDYLOLISTHESIS, stenosis L 12, L 23, L 34, L 45 levels  POST-OPERATIVE DIAGNOSIS:   SCOLIOSIS AND KYPHOSCOLIOSIS/STENOSIS/HNP WITHOUT MYELOPATHY/SPONDYLOLISTHESIS, stenosis L 12, L 23, L 34, L 45 levels   PROCEDURE:  Procedure(s): ANTERIOR LATERAL LUMBAR FUSION  LUMBAR ONE TO LUMBAR FIVE with Percutaneous Pedicle Screws (Right) LUMBAR PERCUTANEOUS PEDICLE SCREW 4 LEVEL (N/A) L 1, L 2, L 3, L 4, L 5 levels  SURGEON:  Surgeon(s) and Role:    * Erline Levine, MD - Primary  PHYSICIAN ASSISTANT: Cabbell  ASSISTANTS: Poteat, RN   ANESTHESIA:   general  EBL:  Total I/O In: 2250 [I.V.:2000; IV Piggyback:250] Out: 625 [Urine:550; Blood:75]  BLOOD ADMINISTERED:none  DRAINS: none   LOCAL MEDICATIONS USED:  LIDOCAINE   SPECIMEN:  No Specimen  DISPOSITION OF SPECIMEN:  N/A  COUNTS:  YES  TOURNIQUET:  * No tourniquets in log *  DICTATION: DICTATION: Patient is a 58 year old with severe spondylosis stenosis and scoliosis of the lumbar spine. It was elected to taken to surgery for anterolateral decompression and posterior pedicle screw fixation.  Procedure: Patient was brought to the operating room and placed in a left lateral decubitus position on the operative table and using orthogonally projected C-arm fluoroscopy the patient was placed so that the L 12, L2-3 L3-4 and L4-5 levels were visualized in AP and lateral plane. The patient was then taped into position. The table was flexed so as to expose the L4-5 level as the patient has a high iliac crest. Skin was marked along with a posterior finger dissection incision. His flank was then prepped and draped in usual sterile fashion and incisions were made sequentially at L4-5 L3-4 and L2-3 and L 12  levels. Posterior finger dissection was made to enter the retroperitoneal space and then  subsequently the probe was inserted into the psoas muscle from the right side initially at the L4-5 level. After mapping the neural elements were able to dock the probe per the midpoint of this vertebral level and without indications electrically of too close proximity to the neural tissues. Subsequently the self-retaining tractor was.after sequential dilators were utilized the shim was employed and the interspace was cleared of psoas muscle and then incised. A thorough discectomy was performed. Angled instruments were used to clear the interspace of disc material. After thorough discectomy was performed and this was performed using AP and lateral fluoroscopy a 10 lordotic by 60 x 22 mm implant was packed with Osteocel plus. This was tamped into position using the slides and its position was confirmed on AP and lateral fluoroscopy. Subsequently exposure was performed at the L3-4 level and similar dissection was performed with locking of the self-retaining retractor. At this level were able to place a 10 lordotic by 22 x 55 mm implant packed in a similar fashion. At the L2-3 level were able to place a 10 lordotic x 22 x  55 mm implant packed in a similar fashion. At L 12, we placed an 8 x 18 x 50 mm implant packed in a similar fashion. Hemostasis was assured the wounds were irrigated interrupted Vicryl sutures and dressed with Dermabond and occlusive dressings.  Patient was then turned into a prone position on the operating table on Jackson Table and using AP and lateral fluoroscopy throughout this portion of the procedure, pedicle screws were placed using Nuvasive cannulated percutaneous screws. 2 screws were placed  at L1, 2 at L 2 and (6.5 x 45 mm) and 2 at L3 and 2 at L4 of a similar size and 2 at L5 of similar size, except for left L 5 screw, which was 6.5 x 50 mm. 120 mm rod were then affixed to the screw heads on the right and 130 mm rod on the left  and locked down on the screws. All connections were then  torqued and the Towers were disassembled. The wounds were irrigated and then closed with 1, 2-0 and 3-0 Vicryl stitches. Sterile occlusive dressing was placed with Dermabond and occlusive dressings. The patient was then extubated in the operating room and taken to recovery in stable and satisfactory condition having tolerated his operation well. Counts were correct at the end of the case.   PLAN OF CARE: Admit to inpatient   PATIENT DISPOSITION:  PACU - hemodynamically stable.   Delay start of Pharmacological VTE agent (>24hrs) due to surgical blood loss or risk of bleeding: yes

## 2014-06-05 NOTE — Interval H&P Note (Signed)
History and Physical Interval Note:  06/05/2014 7:26 AM  Aaron Black  has presented today for surgery, with the diagnosis of SCOLIOSIS AND KYPHOSCOLIOSIS/STENOSIS/HNP WITHOUT MYELOPATHY/SPONDYLOLISTHESIS  The various methods of treatment have been discussed with the patient and family. After consideration of risks, benefits and other options for treatment, the patient has consented to  Procedure(s): ANTERIOR LATERAL LUMBAR FUSION  L1-L5 with Percutaneous Pedicle Screws (Right) LUMBAR PERCUTANEOUS PEDICLE SCREW 4 LEVEL (N/A) as a surgical intervention .  The patient's history has been reviewed, patient examined, no change in status, stable for surgery.  I have reviewed the patient's chart and labs.  Questions were answered to the patient's satisfaction.     Dan Dissinger D

## 2014-06-05 NOTE — Anesthesia Preprocedure Evaluation (Addendum)
Anesthesia Evaluation  Patient identified by MRN, date of birth, ID band Patient awake    Reviewed: Allergy & Precautions, H&P , NPO status , Patient's Chart, lab work & pertinent test results  Airway Mallampati: II  TM Distance: >3 FB Neck ROM: Full    Dental  (+) Dental Advisory Given   Pulmonary sleep apnea , Current Smoker,  breath sounds clear to auscultation        Cardiovascular hypertension, Rhythm:Regular Rate:Normal     Neuro/Psych    GI/Hepatic Neg liver ROS, GERD-  ,  Endo/Other  negative endocrine ROS  Renal/GU negative Renal ROS     Musculoskeletal  (+) Arthritis -,   Abdominal   Peds  Hematology   Anesthesia Other Findings   Reproductive/Obstetrics                         Anesthesia Physical Anesthesia Plan  ASA: III  Anesthesia Plan: General   Post-op Pain Management:    Induction: Intravenous  Airway Management Planned: Oral ETT  Additional Equipment:   Intra-op Plan:   Post-operative Plan: Extubation in OR  Informed Consent: I have reviewed the patients History and Physical, chart, labs and discussed the procedure including the risks, benefits and alternatives for the proposed anesthesia with the patient or authorized representative who has indicated his/her understanding and acceptance.   Dental advisory given  Plan Discussed with: CRNA, Anesthesiologist and Surgeon  Anesthesia Plan Comments:         Anesthesia Quick Evaluation

## 2014-06-05 NOTE — Transfer of Care (Signed)
Immediate Anesthesia Transfer of Care Note  Patient: EFRAIM VANALLEN  Procedure(s) Performed: Procedure(s): ANTERIOR LATERAL LUMBAR FUSION  LUMBAR ONE TO LUMBAR FIVE with Percutaneous Pedicle Screws (Right) LUMBAR PERCUTANEOUS PEDICLE SCREW 4 LEVEL (N/A)  Patient Location: PACU  Anesthesia Type:General  Level of Consciousness: awake and alert   Airway & Oxygen Therapy: Patient Spontanous Breathing and Patient connected to nasal cannula oxygen  Post-op Assessment: Report given to PACU RN and Post -op Vital signs reviewed and stable  Post vital signs: Reviewed and stable  Complications: No apparent anesthesia complications

## 2014-06-05 NOTE — Op Note (Addendum)
06/05/2014  2:01 PM  PATIENT:  Aaron Black  58 y.o. male  PRE-OPERATIVE DIAGNOSIS:  SCOLIOSIS AND KYPHOSCOLIOSIS/STENOSIS/HNP WITHOUT MYELOPATHY/SPONDYLOLISTHESIS, stenosis L 12, L 23, L 34, L 45 levels  POST-OPERATIVE DIAGNOSIS:   SCOLIOSIS AND KYPHOSCOLIOSIS/STENOSIS/HNP WITHOUT MYELOPATHY/SPONDYLOLISTHESIS, stenosis L 12, L 23, L 34, L 45 levels   PROCEDURE:  Procedure(s): ANTERIOR LATERAL LUMBAR FUSION  LUMBAR ONE TO LUMBAR FIVE with Percutaneous Pedicle Screws (Right) LUMBAR PERCUTANEOUS PEDICLE SCREW 4 LEVEL (N/A) L 1, L 2, L 3, L 4, L 5 levels  SURGEON:  Surgeon(s) and Role:    * Erline Levine, MD - Primary  PHYSICIAN ASSISTANT: Cabbell  ASSISTANTS: Poteat, RN   ANESTHESIA:   general  EBL:  Total I/O In: 2250 [I.V.:2000; IV Piggyback:250] Out: 625 [Urine:550; Blood:75]  BLOOD ADMINISTERED:none  DRAINS: none   LOCAL MEDICATIONS USED:  LIDOCAINE   SPECIMEN:  No Specimen  DISPOSITION OF SPECIMEN:  N/A  COUNTS:  YES  TOURNIQUET:  * No tourniquets in log *  DICTATION: DICTATION: Patient is a 58 year old with severe spondylosis stenosis and scoliosis of the lumbar spine. It was elected to taken to surgery for anterolateral decompression and posterior pedicle screw fixation.  Procedure: Patient was brought to the operating room and placed in a left lateral decubitus position on the operative table and using orthogonally projected C-arm fluoroscopy the patient was placed so that the L 12, L2-3 L3-4 and L4-5 levels were visualized in AP and lateral plane. The patient was then taped into position. The table was flexed so as to expose the L4-5 level as the patient has a high iliac crest. Skin was marked along with a posterior finger dissection incision. His flank was then prepped and draped in usual sterile fashion and incisions were made sequentially at L4-5 L3-4 and L2-3 and L 12  levels. Posterior finger dissection was made to enter the retroperitoneal space and then  subsequently the probe was inserted into the psoas muscle from the right side initially at the L4-5 level. After mapping the neural elements were able to dock the probe per the midpoint of this vertebral level and without indications electrically of too close proximity to the neural tissues. Subsequently the self-retaining tractor was.after sequential dilators were utilized the shim was employed and the interspace was cleared of psoas muscle and then incised. A thorough discectomy was performed. Angled instruments were used to clear the interspace of disc material. After thorough discectomy was performed and this was performed using AP and lateral fluoroscopy a 10 lordotic by 60 x 22 mm implant was packed with Osteocel plus. This was tamped into position using the slides and its position was confirmed on AP and lateral fluoroscopy. Subsequently exposure was performed at the L3-4 level and similar dissection was performed with locking of the self-retaining retractor. At this level were able to place a 10 lordotic by 22 x 55 mm implant packed in a similar fashion. At the L2-3 level were able to place a 10 lordotic x 22 x  55 mm implant packed in a similar fashion. At L 12, we placed an 8 x 18 x 50 mm implant packed in a similar fashion. Hemostasis was assured the wounds were irrigated interrupted Vicryl sutures and dressed with Dermabond and occlusive dressings.  Patient was then turned into a prone position on the operating table on Jackson Table and using AP and lateral fluoroscopy throughout this portion of the procedure, pedicle screws were placed using Nuvasive cannulated percutaneous screws. 2 screws were placed  at L1, 2 at L 2 and (6.5 x 45 mm) and 2 at L3 and 2 at L4 of a similar size and 2 at L5 of similar size, except for left L 5 screw, which was 6.5 x 50 mm. 120 mm rod were then affixed to the screw heads on the right and 130 mm rod on the left  and locked down on the screws. All connections were then  torqued and the Towers were disassembled. The wounds were irrigated and then closed with 1, 2-0 and 3-0 Vicryl stitches. Sterile occlusive dressing was placed with Dermabond and occlusive dressings. The patient was then extubated in the operating room and taken to recovery in stable and satisfactory condition having tolerated his operation well. Counts were correct at the end of the case.   PLAN OF CARE: Admit to inpatient   PATIENT DISPOSITION:  PACU - hemodynamically stable.   Delay start of Pharmacological VTE agent (>24hrs) due to surgical blood loss or risk of bleeding: yes   Preoperatively, patient was + 20 (out of sagittal balance and post-operatively, he was + 9, prior to placing prone on [place] table, with balance corrected.

## 2014-06-06 MED ORDER — DEXAMETHASONE SODIUM PHOSPHATE 4 MG/ML IJ SOLN
4.0000 mg | Freq: Three times a day (TID) | INTRAMUSCULAR | Status: DC
  Administered 2014-06-06 – 2014-06-08 (×7): 4 mg via INTRAVENOUS
  Filled 2014-06-06 (×7): qty 1

## 2014-06-06 MED ORDER — HYDROMORPHONE HCL 2 MG PO TABS
2.0000 mg | ORAL_TABLET | ORAL | Status: DC | PRN
  Administered 2014-06-06 – 2014-06-07 (×7): 4 mg via ORAL
  Administered 2014-06-08: 2 mg via ORAL
  Filled 2014-06-06 (×6): qty 2
  Filled 2014-06-06: qty 1
  Filled 2014-06-06: qty 2

## 2014-06-06 MED ORDER — PANTOPRAZOLE SODIUM 40 MG PO TBEC
40.0000 mg | DELAYED_RELEASE_TABLET | Freq: Every day | ORAL | Status: DC
  Administered 2014-06-06 – 2014-06-08 (×3): 40 mg via ORAL
  Filled 2014-06-06 (×3): qty 1

## 2014-06-06 MED ORDER — PNEUMOCOCCAL VAC POLYVALENT 25 MCG/0.5ML IJ INJ
0.5000 mL | INJECTION | INTRAMUSCULAR | Status: DC
  Filled 2014-06-06 (×2): qty 0.5

## 2014-06-06 MED ORDER — DIAZEPAM 5 MG PO TABS
5.0000 mg | ORAL_TABLET | Freq: Four times a day (QID) | ORAL | Status: DC | PRN
  Administered 2014-06-06 – 2014-06-07 (×4): 5 mg via ORAL
  Filled 2014-06-06 (×4): qty 1

## 2014-06-06 MED ORDER — HYDROMORPHONE HCL 1 MG/ML IJ SOLN
0.5000 mg | INTRAMUSCULAR | Status: DC | PRN
  Administered 2014-06-06: 1 mg via INTRAVENOUS
  Administered 2014-06-06 (×2): 1.5 mg via INTRAVENOUS
  Administered 2014-06-08: 1 mg via INTRAVENOUS
  Filled 2014-06-06: qty 1
  Filled 2014-06-06 (×3): qty 2

## 2014-06-06 NOTE — Progress Notes (Signed)
Occupational Therapy Evaluation Patient Details Name: Aaron Black MRN: [id number] DOB: 04/05/1956 Today's Date: 06/06/2014    History of Present Illness Aaron Black is a 58 y.o. Male s/p Anterior lateral lumbar fusion 4 levels on 06/05/14. PMH of DDD, GERD, OSA, HTN, HL, tobacco use, and B12 deficiency with hx of back surgery in December 2014, ACDF (2004), and posterior cervical laminectomy (2009).   Clinical Impression   PTA pt lived at home with his wife and was independent with ADLs. Pt is mobilizing well with Min guard assist and VC's for safety and to maintain back precautions. Pt requires assist for LB ADLs due to decreased ROM and pain. Pt will have assistance from wife at home, but will benefit from acute OT to address independence with functional mobility and LB ADLs.     Follow Up Recommendations  No OT follow up;Supervision/Assistance - 24 hour    Equipment Recommendations  3 in 1 bedside comode    Recommendations for Other Services       Precautions / Restrictions Precautions Precautions: Back Precaution Booklet Issued: Yes (comment) Precaution Comments: Educated pt on 3/3 back precautions and incorporating into ADLs.  Required Braces or Orthoses: Spinal Brace Spinal Brace: Lumbar corset Restrictions Weight Bearing Restrictions: No      Mobility Bed Mobility Overal bed mobility: Needs Assistance Bed Mobility: Rolling;Sidelying to Sit Rolling: Min guard Sidelying to sit: Min assist     Sit to sidelying: Min assist General bed mobility comments: Hand held (A) provided to help pt elevate trunk off bed. Pt required VC's for safety and back precautions.   Transfers Overall transfer level: Needs assistance Equipment used: Rolling walker (2 wheeled) Transfers: Sit to/from Stand Sit to Stand: Min guard         General transfer comment: Min guard for safety. VC's for hand placement. Pt required no physical assist.          ADL Overall ADL's :  Needs assistance/impaired Eating/Feeding: Independent;Sitting   Grooming: Wash/dry hands;Min guard;Standing   Upper Body Bathing: Set up;Sitting   Lower Body Bathing: Minimal assistance;Sit to/from stand   Upper Body Dressing : Minimal assistance;Sitting Upper Body Dressing Details (indicate cue type and reason): including back brace Lower Body Dressing: Moderate assistance;Sit to/from stand Lower Body Dressing Details (indicate cue type and reason): pt unable to reach feet through figure 4 method Toilet Transfer: Min guard;Ambulation;RW (BSC over toilet)   Toileting- Clothing Manipulation and Hygiene: Min guard;Sit to/from stand       Functional mobility during ADLs: Min guard;Rolling walker General ADL Comments: Pt is mobilizing well however unable to recall back precautions. Pt requires assistance to reach LB for ADLs due to pain and decreased ROM.      Vision  Pt reports no change from baseline.                    Perception Perception Perception Tested?: No   Praxis Praxis Praxis tested?: Within functional limits    Pertinent Vitals/Pain Pain Assessment: 0-10 Pain Score: 6  Pain Location: back Pain Descriptors / Indicators: Aching Pain Intervention(s): Limited activity within patient's tolerance;Monitored during session;Repositioned     Hand Dominance Right   Extremity/Trunk Assessment Upper Extremity Assessment Upper Extremity Assessment: Overall WFL for tasks assessed   Lower Extremity Assessment Lower Extremity Assessment: Overall WFL for tasks assessed   Cervical / Trunk Assessment Cervical / Trunk Assessment: Normal   Communication Communication Communication: No difficulties   Cognition Arousal/Alertness: Awake/alert Behavior During Therapy:  WFL for tasks assessed/performed Overall Cognitive Status: Within Functional Limits for tasks assessed       Memory: Decreased recall of precautions                        Home Living  Family/patient expects to be discharged to:: Private residence Living Arrangements: Spouse/significant other Available Help at Discharge: Family;Available 24 hours/day Type of Home: House Home Access: Stairs to enter [employer] of Steps: 1 Entrance Stairs-Rails: None Home Layout: One level     Bathroom Shower/Tub: Occupational psychologist: Standard     Home Equipment: Environmental consultant - 2 wheels;Shower seat          Prior Functioning/Environment Level of Independence: Independent             OT Diagnosis: Acute pain   OT Problem List: Decreased strength;Decreased range of motion;Decreased activity tolerance;Impaired balance (sitting and/or standing);Decreased safety awareness;Decreased knowledge of use of DME or AE;Decreased knowledge of precautions;Pain   OT Treatment/Interventions: Self-care/ADL training;Therapeutic exercise;DME and/or AE instruction;Therapeutic activities;Patient/family education;Balance training    OT Goals(Current goals can be found in the care plan section) Acute Rehab OT Goals Patient Stated Goal: to stay as long as I need to to move better OT Goal Formulation: With patient Time For Goal Achievement: 06/20/14 Potential to Achieve Goals: Good ADL Goals Pt Will Perform Grooming: with supervision;standing Pt Will Perform Lower Body Bathing: with supervision;sit to/from stand Pt Will Perform Upper Body Dressing: with supervision;with set-up;sitting Pt Will Perform Lower Body Dressing: with supervision;sit to/from stand Pt Will Transfer to Toilet: with supervision;ambulating;bedside commode Pt Will Perform Toileting - Clothing Manipulation and hygiene: with supervision;sit to/from stand Additional ADL Goal #1: Pt will verbalize 3/3 back precautions to increase safety during ADLs.   OT Frequency: Min 2X/week              End of Session Equipment Utilized During Treatment: Gait belt;Rolling walker;Back brace Nurse Communication: Other  (comment) (pt sitting up in recliner for lunch)  Activity Tolerance: Patient tolerated treatment well Patient left: in chair;with call bell/phone within reach   Time: 1115-1135 OT Time Calculation (min): 20 min Charges:  OT General Charges $OT Visit: 1 Procedure OT Evaluation $Initial OT Evaluation Tier I: 1 Procedure OT Treatments $Self Care/Home Management : 8-22 mins  Juluis Rainier 06/06/2014, 11:58 AM   Cyndie Chime, OTR/L Occupational Therapist [phone] (pager)

## 2014-06-06 NOTE — Evaluation (Signed)
Physical Therapy Evaluation Patient Details Name: Aaron Black MRN: [id number] DOB: 1956/05/06 Today's Date: 06/06/2014   History of Present Illness  s/p Anterior Lateral Lumbar Fusion L1-5  Clinical Impression  Patient is s/p anterior lumbar surgery resulting in functional limitations due to the deficits listed below (see PT Problem List).  Pt limited on evaluation due to increase overall pain however able to complete functional task with minimal assistance.  Patient will benefit from skilled PT to increase their independence and safety with mobility to allow discharge to the venue listed below.       Follow Up Recommendations No PT follow up;Supervision/Assistance - 24 hour (Will continue to assess pt progress to determine if d/c plans need to change)    Equipment Recommendations  None recommended by PT    Recommendations for Other Services       Precautions / Restrictions Precautions Precautions: Back Precaution Booklet Issued: Yes (comment) Precaution Comments: Educated on 3/3 back precautions and handout given Required Braces or Orthoses:  (No brace ordered) Restrictions Weight Bearing Restrictions: No      Mobility  Bed Mobility Overal bed mobility: Needs Assistance Bed Mobility: Rolling;Sidelying to Sit;Sit to Sidelying Rolling: Min guard Sidelying to sit: Min assist     Sit to sidelying: Min assist General bed mobility comments: Assistance to elevate trunk OOB and assistance to elevate LE into bed with max cues for proper technique to prevent twisting  Transfers Overall transfer level: Needs assistance Equipment used: Rolling walker (2 wheeled) Transfers: Sit to/from Stand Sit to Stand: Min assist         General transfer comment: Assistance to initiate transfer with max cues for hand placement  Ambulation/Gait Ambulation/Gait assistance: Min assist Ambulation Distance (Feet): 3 Feet (side steps to head of bed) Assistive device: Rolling walker (2  wheeled) Gait Pattern/deviations: Step-to pattern;Antalgic Gait velocity: decreased   General Gait Details: Pt ambulated ealier this AM with RN staff and refuse to ambulate further at this time.  Assisted pt to readjust in bed and pt able to complete side steps towards HOB.   Stairs            Wheelchair Mobility    Modified Rankin (Stroke Patients Only)       Balance                                             Pertinent Vitals/Pain Pain Assessment: 0-10 Pain Score: 8  Pain Location: back pain with right incisional pain Pain Descriptors / Indicators: Aching;Burning Pain Intervention(s): Limited activity within patient's tolerance    Home Living Family/patient expects to be discharged to:: Private residence Living Arrangements: Spouse/significant other Available Help at Discharge: Family;Available 24 hours/day Type of Home: House Home Access: Stairs to enter Entrance Stairs-Rails: None Entrance Stairs-Number of Steps: 1 Home Layout: One level Home Equipment: Environmental consultant - 2 wheels      Prior Function Level of Independence: Independent               Hand Dominance   Dominant Hand: Right    Extremity/Trunk Assessment   Upper Extremity Assessment: Defer to OT evaluation           Lower Extremity Assessment: Overall WFL for tasks assessed      Cervical / Trunk Assessment: Normal  Communication   Communication: No difficulties  Cognition Arousal/Alertness: Awake/alert Behavior During Therapy: [facility]  for tasks assessed/performed Overall Cognitive Status: Within Functional Limits for tasks assessed                      General Comments      Exercises        Assessment/Plan    PT Assessment Patient needs continued PT services  PT Diagnosis Difficulty walking;Acute pain   PT Problem List Decreased activity tolerance;Decreased balance;Decreased mobility;Decreased knowledge of use of DME;Decreased knowledge of  precautions;Pain  PT Treatment Interventions DME instruction;Gait training;Stair training;Therapeutic activities;Therapeutic exercise;Patient/family education   PT Goals (Current goals can be found in the Care Plan section) Acute Rehab PT Goals Patient Stated Goal: Improve pain to be able to walk further PT Goal Formulation: With patient Time For Goal Achievement: 06/13/14 Potential to Achieve Goals: Good    Frequency Min 5X/week   Barriers to discharge        Co-evaluation               End of Session Equipment Utilized During Treatment: Gait belt Activity Tolerance: Patient limited by pain Patient left: in bed;with call bell/phone within reach Nurse Communication: Mobility status         Time: 9470-9628 PT Time Calculation (min) (ACUTE ONLY): 24 min   Charges:   PT Evaluation $Initial PT Evaluation Tier I: 1 Procedure PT Treatments $Gait Training: 8-22 mins   PT G Codes:          [place] Mielke Jun 10, 2014, 9:40 AM   Antoine Poche, PT DPT [phone]

## 2014-06-06 NOTE — Progress Notes (Signed)
Subjective: Patient reports sore in back  Objective: Vital signs in last 24 hours: Temp:  [97.5 F (36.4 C)-98.1 F (36.7 C)] 97.7 F (36.5 C) (11/14 0220) Pulse Rate:  [72-93] 85 (11/14 0220) Resp:  [11-22] 14 (11/14 0220) BP: (124-172)/(66-96) 151/91 mmHg (11/14 0220) SpO2:  [95 %-100 %] 100 % (11/14 0220) Weight:  [77.111 kg (170 lb)] 77.111 kg (170 lb) (11/13 0645)  Intake/Output from previous day: 11/13 0701 - 11/14 0700 In: 3550 [I.V.:2800; IV Piggyback:750] Out: 3650 [Urine:3400; Blood:250] Intake/Output this shift: Total I/O In: -  Out: 2050 [Urine:2050]  Physical Exam: Dressings CDI.  Strength full in legs.  Mobilizing in brace  Lab Results:  Recent Labs  06/05/14 1227  HGB 11.9*  HCT 35.0*   BMET  Recent Labs  06/05/14 1227  NA 136*  K 4.6  GLUCOSE 121*    Studies/Results: Dg Lumbar Spine 2-3 Views  06/05/2014   CLINICAL DATA:  L1-5 XLIF  EXAM: DG C-ARM GT 120 MIN; LUMBAR SPINE - 2-3 VIEW  COMPARISON:  Lumbar spine radiographs 04/27/2014. MRI of the lumbar spine 04/15/2014.  FINDINGS: Bilateral pedicle screw and rod fixation is evident L1-5. Disc heights are restored with spaces in place at each level. Alignment is stable. Slight retrolisthesis at at L1-2 is maintained. No new listhesis is present.  IMPRESSION: Interval placement a bilateral pedicle screw and rod fixation L1-L5 with disc spacers at each level. No radiographic evidence for complication.   Electronically Signed   By: Lawrence Santiago M.D.   On: 06/05/2014 14:21   Dg C-arm Gt 120 Min  06/05/2014   CLINICAL DATA:  L1-5 XLIF  EXAM: DG C-ARM GT 120 MIN; LUMBAR SPINE - 2-3 VIEW  COMPARISON:  Lumbar spine radiographs 04/27/2014. MRI of the lumbar spine 04/15/2014.  FINDINGS: Bilateral pedicle screw and rod fixation is evident L1-5. Disc heights are restored with spaces in place at each level. Alignment is stable. Slight retrolisthesis at at L1-2 is maintained. No new listhesis is present.   IMPRESSION: Interval placement a bilateral pedicle screw and rod fixation L1-L5 with disc spacers at each level. No radiographic evidence for complication.   Electronically Signed   By: Lawrence Santiago M.D.   On: 06/05/2014 14:21    Assessment/Plan: Mobilize in brace with PT today.  Doing well POD 1.    LOS: 1 day    Peggyann Shoals, MD 06/06/2014, 6:40 AM

## 2014-06-07 MED ORDER — POLYETHYLENE GLYCOL 3350 17 G PO PACK
17.0000 g | PACK | Freq: Every day | ORAL | Status: DC
  Administered 2014-06-07 – 2014-06-08 (×2): 17 g via ORAL
  Filled 2014-06-07: qty 1

## 2014-06-07 MED ORDER — FLEET ENEMA 7-19 GM/118ML RE ENEM
1.0000 | ENEMA | Freq: Once | RECTAL | Status: AC
  Administered 2014-06-07: 1 via RECTAL
  Filled 2014-06-07: qty 1

## 2014-06-07 NOTE — Progress Notes (Signed)
OT Cancellation Note  Patient Details Name: Aaron Black MRN: [id number] DOB: 06/10/1956   Cancelled Treatment:    Reason Eval/Treat Not Completed: Patient declined, no reason specified. Attempted to see pt twice today and declined both times. Second time pt was up and ambulating to bathroom with RN assistance and reports that he's done too much today and cannot participate in therapy. OT will re-attempt with pt as available.   Villa Herb M   Cyndie Chime, OTR/L Occupational Therapist [phone] (pager)  06/07/2014, 4:20 PM

## 2014-06-07 NOTE — Progress Notes (Signed)
Physical Therapy Treatment Patient Details Name: Aaron Black MRN: [id number] DOB: 06/12/1956 Today's Date: 06/07/2014    History of Present Illness      PT Comments    Progressing well. Continue per POC.  Follow Up Recommendations  No PT follow up;Supervision/Assistance - 24 hour     Equipment Recommendations  None recommended by PT    Recommendations for Other Services       Precautions / Restrictions Precautions Precautions: Back Precaution Comments: Reviewed 3/3 back precautions, including logroll Spinal Brace: Lumbar corset;Applied in sitting position    Mobility  Bed Mobility     Rolling: Min guard Sidelying to sit: Min guard     Sit to sidelying: Min guard General bed mobility comments: verbal cues for logroll  Transfers   Equipment used: Rolling walker (2 wheeled)   Sit to Stand: Min guard         General transfer comment: verbal cues for hand placement  Ambulation/Gait Ambulation/Gait assistance: Min guard Ambulation Distance (Feet): 300 Feet Assistive device: Rolling walker (2 wheeled) Gait Pattern/deviations: Step-through pattern;Decreased stride length Gait velocity: decreased       Stairs            Wheelchair Mobility    Modified Rankin (Stroke Patients Only)       Balance                                    Cognition Arousal/Alertness: Awake/alert Behavior During Therapy: WFL for tasks assessed/performed Overall Cognitive Status: Within Functional Limits for tasks assessed                      Exercises      General Comments        Pertinent Vitals/Pain Pain Assessment: 0-10 Pain Score: 8  Pain Location: back and right hip Pain Intervention(s): Monitored during session    Home Living                      Prior Function            PT Goals (current goals can now be found in the care plan section) Progress towards PT goals: Progressing toward goals    Frequency  Min 5X/week    PT Plan Current plan remains appropriate    Co-evaluation             End of Session Equipment Utilized During Treatment: Gait belt;Back brace Activity Tolerance: Patient tolerated treatment well Patient left: in bed;with call bell/phone within reach     Time: 1336-1349 PT Time Calculation (min) (ACUTE ONLY): 13 min  Charges:  $Gait Training: 8-22 mins $Therapeutic Activity: 8-22 mins                    G Codes:      Lorriane Shire 06/07/2014, 2:27 PM

## 2014-06-07 NOTE — Progress Notes (Signed)
Patient ID: Aaron Black, male   DOB: Oct 12, 1955, 58 y.o.   MRN: [id number] BP 122/67 mmHg  Pulse 74  Temp(Src) 98.3 F (36.8 C) (Oral)  Resp 20  Ht 5\' 7"  (1.702 m)  Wt 77.111 kg (170 lb)  BMI 26.62 kg/m2  SpO2 97% Alert and oriented x 4 Moving lower extremities normally Wounds are clean, dry Doing well overall, far less pain today.

## 2014-06-07 NOTE — Progress Notes (Signed)
Physical Therapy Treatment Patient Details Name: Aaron Black MRN: [id number] DOB: 1956-04-02 Today's Date: 06/07/2014    History of Present Illness      PT Comments    PT has been unable to assess hallway ambulation.  Pt and RN reporting he is ambulating with nursing assist approx. 100 feet with RW.  Follow Up Recommendations  No PT follow up;Supervision/Assistance - 24 hour     Equipment Recommendations  None recommended by PT    Recommendations for Other Services       Precautions / Restrictions Precautions Precautions: Back Precaution Comments: Reviewed 3/3 back precautions, including logroll Required Braces or Orthoses: Spinal Brace Spinal Brace: Lumbar corset;Applied in sitting position Restrictions Weight Bearing Restrictions: No    Mobility  Bed Mobility       Sidelying to sit: Min guard     Sit to sidelying: Min guard General bed mobility comments: verbal cues for logroll  Transfers   Equipment used: Rolling walker (2 wheeled)   Sit to Stand: Min guard         General transfer comment: verbal cues for hand placement  Ambulation/Gait             General Gait Details: Pt just returned to room from ambulating in hallway with RN.   Stairs            Wheelchair Mobility    Modified Rankin (Stroke Patients Only)       Balance                                    Cognition Arousal/Alertness: Awake/alert Behavior During Therapy: WFL for tasks assessed/performed Overall Cognitive Status: Within Functional Limits for tasks assessed                      Exercises      General Comments        Pertinent Vitals/Pain Pain Assessment: 0-10 Pain Score: 10-Worst pain ever Pain Location: back and right hip Pain Intervention(s): Limited activity within patient's tolerance    Home Living                      Prior Function            PT Goals (current goals can now be found in the care plan  section) Progress towards PT goals: Progressing toward goals    Frequency  Min 5X/week    PT Plan Current plan remains appropriate    Co-evaluation             End of Session Equipment Utilized During Treatment: Gait belt Activity Tolerance: Patient limited by pain Patient left: in bed;with family/visitor present;with call bell/phone within reach     Time: 0904-0912 PT Time Calculation (min) (ACUTE ONLY): 8 min  Charges:  $Therapeutic Activity: 8-22 mins                    G Codes:      Lorriane Shire 06/07/2014, 10:28 AM

## 2014-06-08 ENCOUNTER — Encounter (HOSPITAL_COMMUNITY): Payer: Self-pay | Admitting: Neurosurgery

## 2014-06-08 NOTE — Plan of Care (Signed)
Problem: Phase II Progression Outcomes Goal: Progress activity as tolerated unless otherwise ordered Outcome: Completed/Met Date Met:  06/08/14 Goal: Discharge plan established Outcome: Completed/Met Date Met:  06/08/14 Goal: Tolerating diet Outcome: Completed/Met Date Met:  06/08/14 Goal: Understands assist devices with ambulation Outcome: Completed/Met Date Met:  06/08/14

## 2014-06-08 NOTE — Progress Notes (Signed)
Physical Therapy Treatment Patient Details Name: KINGSTYN DERUITER MRN: [id number] DOB: [birth year] Today's Date: 06/08/2014    History of Present Illness Arseniy Toomey is a 58 y.o. Male s/p Anterior lateral lumbar fusion 4 levels on 06/05/14. PMH of DDD, GERD, OSA, HTN, HL, tobacco use, and B12 deficiency with hx of back surgery in December 2014, ACDF (2004), and posterior cervical laminectomy (2009).    PT Comments    Patient progressing well with overall mobility and able to complete stair training this AM. Patient safe to D/C from a mobility standpoint based on progression towards goals set on PT eval.    Follow Up Recommendations  No PT follow up;Supervision/Assistance - 24 hour     Equipment Recommendations  None recommended by PT    Recommendations for Other Services       Precautions / Restrictions Precautions Precautions: Back Precaution Comments: Reviewed 3/3 back precautions, including logroll Required Braces or Orthoses: Spinal Brace Spinal Brace: Lumbar corset;Applied in sitting position Restrictions Weight Bearing Restrictions: No    Mobility  Bed Mobility Overal bed mobility: Modified Independent             General bed mobility comments: hob flat, no rails used demonstrated safe log roll technique  Transfers Overall transfer level: Modified independent Equipment used: Rolling walker (2 wheeled) Transfers: Sit to/from [facility] Sit to Stand: Supervision Stand pivot transfers: Supervision       General transfer comment: Patient with correct technique  Ambulation/Gait Ambulation/Gait assistance: Modified independent (Device/Increase time) Ambulation Distance (Feet): 600 Feet Assistive device: Rolling walker (2 wheeled) Gait Pattern/deviations: Step-through pattern         Stairs Stairs: Yes Stairs assistance: Supervision Stair Management: Alternating pattern;Forwards;With walker;Step to pattern Number of Stairs:  3 General stair comments: Patient with safe technique  Wheelchair Mobility    Modified Rankin (Stroke Patients Only)       Balance                                    Cognition Arousal/Alertness: Awake/alert Behavior During Therapy: WFL for tasks assessed/performed Overall Cognitive Status: Within Functional Limits for tasks assessed                      Exercises      General Comments        Pertinent Vitals/Pain Pain Assessment: No/denies pain    Home Living                      Prior Function            PT Goals (current goals can now be found in the care plan section) Progress towards PT goals: Progressing toward goals    Frequency  Min 5X/week    PT Plan Current plan remains appropriate    Co-evaluation             End of Session Equipment Utilized During Treatment: Back brace Activity Tolerance: Patient tolerated treatment well Patient left: in chair;with call bell/phone within reach;with family/visitor present     Time: 2297-9892 PT Time Calculation (min) (ACUTE ONLY): 13 min  Charges:  $Gait Training: 8-22 mins                    G Codes:      Jacqualyn Posey 06/08/2014, 9:49 AM 06/08/2014 Jacqualyn Posey PTA [phone] pager  [phone] office     

## 2014-06-08 NOTE — Progress Notes (Signed)
Occupational Therapy Treatment Patient Details Name: Aaron Black MRN: [id number] DOB: [birth year] Today's Date: 06/08/2014    History of present illness Aaron Black is a 58 y.o. Male s/p Anterior lateral lumbar fusion 4 levels on 06/05/14. PMH of DDD, GERD, OSA, HTN, HL, tobacco use, and B12 deficiency with hx of back surgery in December 2014, ACDF (2004), and posterior cervical laminectomy (2009).   OT comments  Progressing well with OT goals and ready for d/c today.  Able to state and demo all back precautions during functional tasks.  Performs bed mobility mod I.  Also demonstrates safe log roll tech. Wife present and states she will assist with LB selfcare at home.    Follow Up Recommendations  No OT follow up;Supervision/Assistance - 24 hour    Equipment Recommendations  3 in 1 bedside comode    Recommendations for Other Services      Precautions / Restrictions Precautions Precautions: Back Required Braces or Orthoses: Spinal Brace Spinal Brace: Lumbar corset;Applied in sitting position Restrictions Weight Bearing Restrictions: No       Mobility Bed Mobility Overal bed mobility: Modified Independent             General bed mobility comments: hob flat, no rails used demonstrated safe log roll technique  Transfers Overall transfer level: Needs assistance Equipment used: Rolling walker (2 wheeled) Transfers: Sit to/from [facility] Sit to Stand: Supervision Stand pivot transfers: Supervision            Balance                                   ADL Overall ADL's : Needs assistance/impaired               Lower Body Bathing Details (indicate cue type and reason): wife present and states she will assist, declines need for A/E Upper Body Dressing : Set up Upper Body Dressing Details (indicate cue type and reason): dons brace with set up   Lower Body Dressing Details (indicate cue type and reason): wife present and  states she will assist, declines need for A/E Toilet Transfer: Supervision/safety;RW Toilet Transfer Details (indicate cue type and reason): sim. with transfer while ambulating to/from b.room and to [place] and Hygiene: Supervision/safety;Adhering to back precautions   Tub/ Shower Transfer: Walk-in shower;Anterior/posterior;Ambulation;Rolling walker Tub/Shower Transfer Details (indicate cue type and reason): pt. has a walk in shower, able to complete simulated shower stall transfer stepping over elevated doorway side ways and also attemtped ant./posterior Functional mobility during ADLs: Supervision/safety;Rolling walker General ADL Comments: able to state and safely demonstrate 3/3 back precautions.  wife will assist with lb adls, performed shower stall transfer without cues or safety issues noted      Vision                     Perception     Praxis      Cognition                             Extremity/Trunk Assessment               Exercises     Shoulder Instructions       General Comments      Pertinent Vitals/ Pain       Pain Assessment: No/denies pain  Home Living  Prior Functioning/Environment              Frequency Min 2X/week     Progress Toward Goals  OT Goals(current goals can now be found in the care plan section)  Progress towards OT goals: Progressing toward goals     Plan Discharge plan remains appropriate    Co-evaluation                 End of Session Equipment Utilized During Treatment: Gait belt;Rolling walker;Back brace   Activity Tolerance Patient tolerated treatment well   Patient Left in chair;with call bell/phone within reach;with family/visitor present   Nurse Communication          Time: 4431-5400 OT Time Calculation (min): 10 min  Charges: OT General Charges $OT Visit: 1 Procedure OT  Treatments $Self Care/Home Management : 8-22 mins  Aaron Black, COTA/L 06/08/2014, 8:47 AM

## 2014-06-08 NOTE — Progress Notes (Signed)
Subjective: Patient reports "I'm doing great! I'm ready to go home"  Objective: Vital signs in last 24 hours: Temp:  [97.8 F (36.6 C)-98.8 F (37.1 C)] 98 F (36.7 C) (11/16 0506) Pulse Rate:  [59-81] 73 (11/16 0506) Resp:  [16-20] 16 (11/16 0506) BP: (115-151)/(62-81) 151/81 mmHg (11/16 0506) SpO2:  [94 %-97 %] 97 % (11/16 0506)  Intake/Output from previous day: 11/15 0701 - 11/16 0700 In: 240 [P.O.:240] Out: 1550 [Urine:1550] Intake/Output this shift:    Alert, ambulating to chair for breakfast, reporting no pain at present.  Pleased with Dilaudid for pain control. Good strength BLE. Incisions without erythema, swelling, or drainage. Dermabond & honeycomb drsgs intact.   Lab Results:  Recent Labs  06/05/14 1227  HGB 11.9*  HCT 35.0*   BMET  Recent Labs  06/05/14 1227  NA 136*  K 4.6  GLUCOSE 121*    Studies/Results: No results found.  Assessment/Plan: Improved    LOS: 3 days  Per DrStern, d/c IV, d/c to home. Rx's for Dilaudid 2mg  and Valium 5mg   will be tubed up for pts chart.    Verdis Prime 06/08/2014, 8:01 AM

## 2014-06-08 NOTE — Discharge Summary (Signed)
Physician Discharge Summary  Patient ID: Aaron Black MRN: [id number] DOB/AGE: 04-06-1956 58 y.o.  Admit date: 06/05/2014 Discharge date: 06/08/2014  Admission Diagnoses: SCOLIOSIS AND KYPHOSCOLIOSIS/STENOSIS/HNP WITHOUT MYELOPATHY/SPONDYLOLISTHESIS, stenosis L 12, L 23, L 34, L 45 levels   Discharge Diagnoses: SCOLIOSIS AND KYPHOSCOLIOSIS/STENOSIS/HNP WITHOUT MYELOPATHY/SPONDYLOLISTHESIS, stenosis L 12, L 23, L 34, L 45 levels s/p ANTERIOR LATERAL LUMBAR FUSION  LUMBAR ONE TO LUMBAR FIVE with Percutaneous Pedicle Screws (Right) LUMBAR PERCUTANEOUS PEDICLE SCREW 4 LEVEL (N/A) L 1, L 2, L 3, L 4, L 5 levels  Active Problems:   Lumbar spine scoliosis   Discharged Condition: good  Hospital Course: Aaron Black was admitted for surgery with dx scoliosis, stenosis, and hnp.  Following uncomplicated Q1-1 anterolateral interbody fusion and posterior percutaneous pedicle screws, he recovered nicely and transferred to [facility] for nursing care and therapies. He has progressed steadily.  Consults: None  Significant Diagnostic Studies: radiology: X-Ray: intra-operative  Treatments: surgery: ANTERIOR LATERAL LUMBAR FUSION  LUMBAR ONE TO LUMBAR FIVE with Percutaneous Pedicle Screws (Right) LUMBAR PERCUTANEOUS PEDICLE SCREW 4 LEVEL (N/A) L 1, L 2, L 3, L 4, L 5 levels   Discharge Exam: Blood pressure 151/81, pulse 73, temperature 98 F (36.7 C), temperature source Oral, resp. rate 16, height 5\' 7"  (1.702 m), weight 77.111 kg (170 lb), SpO2 97 %. Alert, ambulating to chair for breakfast, reporting no pain at present.  Pleased with Dilaudid for pain control. Good strength BLE. Incisions without erythema, swelling, or drainage. BM last night. Voiding without difficulty. Dermabond & honeycomb drsgs intact.    Disposition: 01-Home or Self Care  Rx's Dilaudid 2mg  1-2 po q4hrs prn pain #50; Valium 5mg  TID prn spasm. Pt verbalizes understanding of d/c instructions & agrees to call office to schedule 3-4  week f/u appt.     Medication List    ASK your doctor about these medications        ALPRAZolam 0.5 MG tablet  Commonly known as:  XANAX  Take 0.5 mg by mouth 3 (three) times daily as needed for anxiety.     B-D ECLIPSE SYRINGE 30G X 1/2" 1 ML Misc  Generic drug:  Syringe/Needle (Disp)  every 30 (thirty) days.     colchicine 0.6 MG tablet  Take 0.6 mg by mouth daily as needed (GOUT).     cyanocobalamin 1000 MCG/ML injection  Commonly known as:  (VITAMIN B-12)  inject 1 milliliter INTO THE MUSCLE EVERY 30 DAYS     cyclobenzaprine 10 MG tablet  Commonly known as:  FLEXERIL  Take 10 mg by mouth 2 (two) times daily.     diclofenac 75 MG EC tablet  Commonly known as:  VOLTAREN  Take 1 tablet (75 mg total) by mouth 2 (two) times daily.     diltiazem 240 MG 24 hr capsule  Commonly known as:  CARDIZEM CD  TAKE ONE CAPSULE BY MOUTH EVERY DAY     diltiazem 240 MG 24 hr capsule  Commonly known as:  DILACOR XR  Take 240 mg by mouth daily.     HYDROcodone-acetaminophen 5-325 MG per tablet  Commonly known as:  NORCO/VICODIN  Take 1 tablet by mouth every 6 (six) hours as needed for moderate pain.     lisinopril 20 MG tablet  Commonly known as:  PRINIVIL,ZESTRIL  TAKE 1 TABLET BY MOUTH EVERY DAY     omeprazole 20 MG tablet  Commonly known as:  PRILOSEC OTC  Take 2 tablets (40 mg total) by mouth daily.  sertraline 50 MG tablet  Commonly known as:  ZOLOFT  TAKE 1 TABLET (50 MG TOTAL) BY MOUTH DAILY.     simvastatin 20 MG tablet  Commonly known as:  ZOCOR  TAKE 1 TABLET BY MOUTH AT BEDTIME         Signed: Verdis Prime 06/08/2014, 8:05 AM

## 2014-06-08 NOTE — Plan of Care (Signed)
Problem: Phase I Progression Outcomes Goal: Pain controlled with appropriate interventions Outcome: Completed/Met Date Met:  06/08/14 Goal: OOB as tolerated unless otherwise ordered Outcome: Completed/Met Date Met:  06/08/14 Goal: Log roll for position change Outcome: Completed/Met Date Met:  06/08/14 Goal: Hemodynamically stable Outcome: Completed/Met Date Met:  06/08/14

## 2014-06-08 NOTE — Progress Notes (Signed)
CBG recheck is 110. Will continue to monitor.

## 2014-06-08 NOTE — Progress Notes (Signed)
Pt has discharge orders called MD office to get order reconciliation released.

## 2014-06-08 NOTE — Progress Notes (Signed)
Pt discharged with his wife at this time. Pt alert, verbal with no noted distress. Tolerated all am meds without difficulty. IV discontinued with dry dressing applied.  Pt to pickup prescriptions from MD office. Discharge instructions given to pt and wife with verbal understanding. All personal belongings including brace sent home with pt.

## 2014-06-22 ENCOUNTER — Emergency Department (HOSPITAL_COMMUNITY)
Admission: EM | Admit: 2014-06-22 | Discharge: 2014-06-22 | Disposition: A | Discharge status: Home / Self Care | Payer: Managed Care, Other (non HMO) | Attending: Emergency Medicine | Admitting: Emergency Medicine

## 2014-06-22 ENCOUNTER — Encounter (HOSPITAL_COMMUNITY): Payer: Self-pay | Admitting: Emergency Medicine

## 2014-06-22 DIAGNOSIS — E785 Hyperlipidemia, unspecified: Secondary | ICD-10-CM | POA: Diagnosis not present

## 2014-06-22 DIAGNOSIS — G8929 Other chronic pain: Secondary | ICD-10-CM | POA: Insufficient documentation

## 2014-06-22 DIAGNOSIS — I10 Essential (primary) hypertension: Secondary | ICD-10-CM | POA: Insufficient documentation

## 2014-06-22 DIAGNOSIS — Z9889 Other specified postprocedural states: Secondary | ICD-10-CM | POA: Insufficient documentation

## 2014-06-22 DIAGNOSIS — D649 Anemia, unspecified: Secondary | ICD-10-CM | POA: Diagnosis not present

## 2014-06-22 DIAGNOSIS — Z86010 Personal history of colonic polyps: Secondary | ICD-10-CM | POA: Insufficient documentation

## 2014-06-22 DIAGNOSIS — F329 Major depressive disorder, single episode, unspecified: Secondary | ICD-10-CM | POA: Insufficient documentation

## 2014-06-22 DIAGNOSIS — Z720 Tobacco use: Secondary | ICD-10-CM | POA: Insufficient documentation

## 2014-06-22 DIAGNOSIS — G8918 Other acute postprocedural pain: Secondary | ICD-10-CM | POA: Diagnosis not present

## 2014-06-22 DIAGNOSIS — Z981 Arthrodesis status: Secondary | ICD-10-CM | POA: Diagnosis not present

## 2014-06-22 DIAGNOSIS — E538 Deficiency of other specified B group vitamins: Secondary | ICD-10-CM | POA: Insufficient documentation

## 2014-06-22 DIAGNOSIS — Z8719 Personal history of other diseases of the digestive system: Secondary | ICD-10-CM | POA: Diagnosis not present

## 2014-06-22 DIAGNOSIS — F419 Anxiety disorder, unspecified: Secondary | ICD-10-CM | POA: Diagnosis not present

## 2014-06-22 DIAGNOSIS — M545 Low back pain, unspecified: Secondary | ICD-10-CM

## 2014-06-22 DIAGNOSIS — Z79899 Other long term (current) drug therapy: Secondary | ICD-10-CM | POA: Diagnosis not present

## 2014-06-22 DIAGNOSIS — M1000 Idiopathic gout, unspecified site: Secondary | ICD-10-CM | POA: Diagnosis not present

## 2014-06-22 DIAGNOSIS — Z8669 Personal history of other diseases of the nervous system and sense organs: Secondary | ICD-10-CM | POA: Insufficient documentation

## 2014-06-22 DIAGNOSIS — M1990 Unspecified osteoarthritis, unspecified site: Secondary | ICD-10-CM | POA: Diagnosis not present

## 2014-06-22 MED ORDER — HYDROMORPHONE HCL 1 MG/ML IJ SOLN: 1.0000 mg | Freq: Once | INTRAMUSCULAR | Status: DC

## 2014-06-22 MED ORDER — HYDROMORPHONE HCL 1 MG/ML IJ SOLN
2.0000 mg | Freq: Once | INTRAMUSCULAR | Status: AC
Start: 2014-06-22 — End: 2014-06-22
  Administered 2014-06-22: 2 mg via INTRAVENOUS
  Filled 2014-06-22: qty 2

## 2014-06-22 MED ORDER — METHYLPREDNISOLONE 4 MG PO KIT: PACK | ORAL | Status: DC

## 2014-06-22 MED ORDER — ONDANSETRON HCL 4 MG/2ML IJ SOLN
4.0000 mg | Freq: Once | INTRAMUSCULAR | Status: AC
  Administered 2014-06-22: 4 mg via INTRAVENOUS
  Filled 2014-06-22: qty 2

## 2014-06-22 MED ORDER — MORPHINE SULFATE ER 15 MG PO TBCR: 15.0000 mg | EXTENDED_RELEASE_TABLET | Freq: Three times a day (TID) | ORAL | Status: DC | PRN

## 2014-06-22 MED ORDER — FENTANYL CITRATE 0.05 MG/ML IJ SOLN
50.0000 ug | Freq: Once | INTRAMUSCULAR | Status: AC
  Administered 2014-06-22: 50 ug via INTRAVENOUS
  Filled 2014-06-22: qty 2

## 2014-06-22 MED ORDER — LORAZEPAM 2 MG/ML IJ SOLN
1.0000 mg | Freq: Once | INTRAMUSCULAR | Status: AC
  Administered 2014-06-22: 1 mg via INTRAVENOUS
  Filled 2014-06-22: qty 1

## 2014-06-22 MED ORDER — METHOCARBAMOL 1000 MG/10ML IJ SOLN: 1000.0000 mg | Freq: Once | INTRAMUSCULAR | Status: DC

## 2014-06-22 MED ORDER — HYDROMORPHONE HCL 1 MG/ML IJ SOLN
1.0000 mg | Freq: Once | INTRAMUSCULAR | Status: AC
  Administered 2014-06-22: 1 mg via INTRAVENOUS
  Filled 2014-06-22: qty 1

## 2014-06-22 MED ORDER — DEXTROSE 5 % IV SOLN
1000.0000 mg | Freq: Once | INTRAVENOUS | Status: AC
  Administered 2014-06-22: 1000 mg via INTRAVENOUS
  Filled 2014-06-22: qty 10

## 2014-06-22 NOTE — ED Provider Notes (Signed)
CSN: [id number]     Arrival date & time 06/22/14  0319 History   First MD Initiated Contact with Patient 06/22/14 0409     Chief Complaint  Patient presents with  . Back Pain     (Consider location/radiation/quality/duration/timing/severity/associated sxs/prior Treatment) Patient is a 58 y.o. male presenting with back pain. The history is provided by the patient.  Back Pain He is 17 days status post lumbar surgery and comes in because of worsening low back pain. Pain was present on discharge but has gotten worse since he has been home. He has been taking hydromorphone for pain without relief. He was initially taking 2 mg tablets and then was increased to 4 mg tablets but still not getting relief. He has also been taking diazepam 5 mg with no relief. Back pain is severe and he rates it at 10/10. He cannot find a position that is comfortable. In addition, he states that he feels like he is about to develop cramps in his thighs and calves. Is complaining of pain in his toes and knees which he feels is gout. He denies fever, chills, sweats. He denies nausea or vomiting. EMS treated him with fentanyl intravenously which did give slight, but temporary, relief. Of note, he does have an appointment with his neurosurgeon this afternoon, but he did not feel he could wait that long.  Past Medical History  Diagnosis Date  . Diverticulosis of colon   . GERD (gastroesophageal reflux disease)   . Cervical disc disease     a. 07/2003 ant cervical deomcpression and fusion C5-6/C6-7;  b. 09/2007 post cervical laminectomy C4-5 with scres and arthrodesis C4-C7.  Marland Kitchen Sleep apnea   . Arthritis   . Anxiety   . Hypertension   . Colon polyps   . B12 deficiency   . Anemia   . Hyperlipidemia   . Gout   . Pleurisy   . Tobacco abuse     a. 40 yrs, 1.5-3 ppd over that time.  . Chronic lower back pain   . Complication of anesthesia     " i WOKE UP DURING A COLONOSCOPY "  . Chest pain at rest 02/25/2013  .  Depression    Past Surgical History  Procedure Laterality Date  . Rotator cuff repair  August 2000, January 2002, July 2008, July 2009    2 left 2 right  . Neck surgery  January 2005, March 2009    C-Spine  . Cervical disc surgery      X 2  . Elbow arthroscopy Right   . Carpal tunnel release Right   . Colonoscopy    . Lumbar laminectomy/decompression microdiscectomy Right 07/11/2013    Procedure: LUMBAR LAMINECTOMY/DECOMPRESSION MICRODISCECTOMY 1 LEVEL;  Surgeon: Erline Levine, MD;  Location: [facility];  Service: Neurosurgery;  Laterality: Right;  Right L34 microdiskectomy  . Knee arthroscopy Right 08/2013    torn menicus  . Knee arthroplasty Left 11/2013    chip cartlidge,torn menicus  . Anterior lateral lumbar fusion 4 levels Right 06/05/2014    Procedure: ANTERIOR LATERAL LUMBAR FUSION  LUMBAR ONE TO LUMBAR FIVE with Percutaneous Pedicle Screws;  Surgeon: Erline Levine, MD;  Location: [facility];  Service: Neurosurgery;  Laterality: Right;  . Lumbar percutaneous pedicle screw 4 level N/A 06/05/2014    Procedure: LUMBAR PERCUTANEOUS PEDICLE SCREW 4 LEVEL;  Surgeon: Erline Levine, MD;  Location: [facility];  Service: Neurosurgery;  Laterality: N/A;   Family History  Problem Relation Age of Onset  .  Bone cancer Mother   . Colon polyps Mother   . Squamous cell carcinoma Mother     died @ 27  . Hypertension Father   . Colon polyps Father   . Diabetes Father     borderline  . Congestive Heart Failure Father     alive @ 35  . Sudden death Brother     died @ 19  . Other Brother     died in [place] @ 31 - struck by drunk driver  . Other Brother     died in MVA @ 60 - struck by drunk driver  . Heart attack Mother   . Heart failure Father   . Heart attack Brother   . Stroke Paternal Grandfather    History  Substance Use Topics  . Smoking status: Heavy Tobacco Smoker -- 1.00 packs/day for 40 years    Last Attempt to Quit: 02/24/2013  . Smokeless tobacco: Never Used      Comment: Currently smoking 1.5 ppd.  Has previously smoked up to 3 ppd and did so for about 20 yrs.  . Alcohol Use: Yes     Comment: at least 3 beers/night.    Review of Systems  Musculoskeletal: Positive for back pain.  All other systems reviewed and are negative.     Allergies  Percodan  Home Medications   Prior to Admission medications   Medication Sig Start Date End Date Taking? Authorizing Provider  ALPRAZolam Duanne Moron) 0.5 MG tablet Take 0.5 mg by mouth 3 (three) times daily as needed for anxiety.    Historical Provider, MD  colchicine 0.6 MG tablet Take 0.6 mg by mouth daily as needed (GOUT).  11/21/13   Historical Provider, MD  cyanocobalamin (,VITAMIN B-12,) 1000 MCG/ML injection inject 1 milliliter INTO THE MUSCLE EVERY 30 DAYS 03/16/14   Marletta Lor, MD  cyclobenzaprine (FLEXERIL) 10 MG tablet Take 10 mg by mouth 2 (two) times daily.  12/08/13   Historical Provider, MD  diltiazem (CARDIZEM CD) 240 MG 24 hr capsule TAKE ONE CAPSULE BY MOUTH EVERY DAY    Marletta Lor, MD  diltiazem (DILACOR XR) 240 MG 24 hr capsule Take 240 mg by mouth daily.    Historical Provider, MD  HYDROcodone-acetaminophen (NORCO/VICODIN) 5-325 MG per tablet Take 1 tablet by mouth every 6 (six) hours as needed for moderate pain. 07/11/13   Ashok Pall, MD  lisinopril (PRINIVIL,ZESTRIL) 20 MG tablet TAKE 1 TABLET BY MOUTH EVERY DAY 04/20/14   Marletta Lor, MD  omeprazole (PRILOSEC OTC) 20 MG tablet Take 2 tablets (40 mg total) by mouth daily. 02/27/13   Sheila Oats, MD  sertraline (ZOLOFT) 50 MG tablet TAKE 1 TABLET (50 MG TOTAL) BY MOUTH DAILY. 02/06/14   Marletta Lor, MD  simvastatin (ZOCOR) 20 MG tablet TAKE 1 TABLET BY MOUTH AT BEDTIME 02/13/14   Marletta Lor, MD  Syringe/Needle, Disp, (B-D ECLIPSE SYRINGE) 30G X 1/2" 1 ML MISC every 30 (thirty) days.      Historical Provider, MD   BP 115/68 mmHg  Pulse 88  Temp(Src) 98.3 F (36.8 C) (Oral)  Resp 20  SpO2  100% Physical Exam  Nursing note and vitals reviewed.  58 year old male, who appears to be in pain, but is in no acute distress. Vital signs are normal. Oxygen saturation is 100%, which is normal. Head is normocephalic and atraumatic. PERRLA, EOMI. Oropharynx is clear. Neck is nontender and supple without adenopathy or JVD. Back: Surgical scars are  present in lumbar area and are healing appropriately without sign of infection. There is tenderness diffusely throughout the lumbar area with moderate to severe bilateral paralumbar spasm.. Lungs are clear without rales, wheezes, or rhonchi. Chest is nontender. Heart has regular rate and rhythm without murmur. Abdomen is soft, flat, nontender without masses or hepatosplenomegaly and peristalsis is normoactive. Extremities have no cyanosis or edema, full range of motion is present.  Skin is warm and dry without rash. Neurologic: Mental status is normal, cranial nerves are intact, there are no motor or sensory deficits.  ED Course  Procedures (including critical care time)  MDM   Final diagnoses:  Midline low back pain without sciatica  Idiopathic gout, unspecified chronicity, unspecified site    Postoperative back pain not responding to high doses of narcotics. He will be given intravenous hydromorphone and will give a trial of intravenous methocarbamol.  He feels much better after above noted treatment. However, he is very anxious about going home. He is requesting I contact his neurosurgeon to see if he can be seen in the ED. I've spoken with Dr. Annette Stable, who will contact the patient's surgeon, Dr. Vertell Limber, see if the patient can be evaluated in the ED.  Delora Fuel, MD [phone]

## 2014-06-22 NOTE — ED Notes (Signed)
MD at bedside. 

## 2014-06-22 NOTE — Consult Note (Signed)
Reason for Consult:Postoperative pain Referring Physician: [name] is an 58 y.o. male.  HPI: Patient is 58 year old man who recently underwent multilevel spinal decompression and fusion surgery.  He comes to ER stating that his pain is currently poorly controlled with po dilaudid.  His incisions are healing well and his strength is full in both lower extremities.  He remains concerned about pending legal action.  Past Medical History  Diagnosis Date  . Diverticulosis of colon   . GERD (gastroesophageal reflux disease)   . Cervical disc disease     a. 07/2003 ant cervical deomcpression and fusion C5-6/C6-7;  b. 09/2007 post cervical laminectomy C4-5 with scres and arthrodesis C4-C7.  Marland Kitchen Sleep apnea   . Arthritis   . Anxiety   . Hypertension   . Colon polyps   . B12 deficiency   . Anemia   . Hyperlipidemia   . Gout   . Pleurisy   . Tobacco abuse     a. 40 yrs, 1.5-3 ppd over that time.  . Chronic lower back pain   . Complication of anesthesia     " i WOKE UP DURING A COLONOSCOPY "  . Chest pain at rest 02/25/2013  . Depression     Past Surgical History  Procedure Laterality Date  . Rotator cuff repair  August 2000, January 2002, July 2008, July 2009    2 left 2 right  . Neck surgery  January 2005, March 2009    C-Spine  . Cervical disc surgery      X 2  . Elbow arthroscopy Right   . Carpal tunnel release Right   . Colonoscopy    . Lumbar laminectomy/decompression microdiscectomy Right 07/11/2013    Procedure: LUMBAR LAMINECTOMY/DECOMPRESSION MICRODISCECTOMY 1 LEVEL;  Surgeon: Erline Levine, MD;  Location: [facility];  Service: Neurosurgery;  Laterality: Right;  Right L34 microdiskectomy  . Knee arthroscopy Right 08/2013    torn menicus  . Knee arthroplasty Left 11/2013    chip cartlidge,torn menicus  . Anterior lateral lumbar fusion 4 levels Right 06/05/2014    Procedure: ANTERIOR LATERAL LUMBAR FUSION  LUMBAR ONE TO LUMBAR FIVE with Percutaneous Pedicle  Screws;  Surgeon: Erline Levine, MD;  Location: [facility];  Service: Neurosurgery;  Laterality: Right;  . Lumbar percutaneous pedicle screw 4 level N/A 06/05/2014    Procedure: LUMBAR PERCUTANEOUS PEDICLE SCREW 4 LEVEL;  Surgeon: Erline Levine, MD;  Location: [facility];  Service: Neurosurgery;  Laterality: N/A;    Family History  Problem Relation Age of Onset  . Bone cancer Mother   . Colon polyps Mother   . Squamous cell carcinoma Mother     died @ 16  . Hypertension Father   . Colon polyps Father   . Diabetes Father     borderline  . Congestive Heart Failure Father     alive @ 43  . Sudden death Brother     died @ 18  . Other Brother     died in [place] @ 55 - struck by drunk driver  . Other Brother     died in MVA @ 73 - struck by drunk driver  . Heart attack Mother   . Heart failure Father   . Heart attack Brother   . Stroke Paternal Grandfather     Social History:  reports that he has been smoking.  He has never used smokeless tobacco. He reports that he drinks alcohol. He reports that he does  not use illicit drugs.  Allergies:  Allergies  Allergen Reactions  . Percodan [Oxycodone-Aspirin] Rash    Percodan caused rash---but he can tolerate Percocet and aspirin on their own     Medications: I have reviewed the patient's current medications.  No results found for this or any previous visit (from the past 48 hour(s)).  No results found.  Review of Systems - Negative except as above    Blood pressure 129/78, pulse 91, temperature 98.3 F (36.8 C), temperature source Oral, resp. rate 22, SpO2 95 %. Physical Exam Awake, alert, conversant.  Full strength both lower extremities all motor groups.  Incisions CDI. Assessment/Plan: Patient will be started on MS Contin 20 mg TID per discussion with Pharmacist and continue on po dilaudid 2 mg Q 6 H prn for breakthrough pain.  Patient will be started on medrol dosepack and continue on Tizanidine.  He will follow up in office  on Dec 9 as previously scheduled. Discussed with patient, his wife, and Dr. Ashok Cordia.  Peggyann Shoals, MD 06/22/2014, 8:22 AM

## 2014-06-22 NOTE — ED Provider Notes (Signed)
Signed out by Dr Roxanne Mins to d/c to home after seen by his neurosurgeon.  Dr Vertell Limber has seen, and requests pt be d/cd to home w rx ms contin 20 mg tid and medrol dose pack, and he will f/u closely in office.  Pt comfortable appearing. Nad. No new numbness or weakness, or acute change in normal functional ability. Spine nt. Surgical incisions healing without sign of infection.  Pt currently appears stable for d/c.      Mirna Mires, MD 06/22/14 [phone]

## 2014-06-22 NOTE — ED Notes (Signed)
Per EMS, the patient has had back surgery on 06/08/2014. He reports being presribed dilaudid and valium for pain but it has been ineffective.  245mcg of fentanyl was given en route. Vs in route: bp 140/92 p 88, rr 16, o2 sat 99% on room air. 20g present on arrival to ER.

## 2014-06-22 NOTE — ED Notes (Signed)
Dr. Glick at the bedside.  

## 2014-06-22 NOTE — Discharge Instructions (Signed)
It was our pleasure to provide your ER care today - we hope that you feel better.  Take longer acting pain medication ( MS Contin) as prescribed - no driving when taking this medication.  Take medrol dose pack as prescribed.  Follow up with your doctor as planned in the next 1-2 weeks.  Return to ER if worse, new symptoms, fevers, numbness/weakness, infection of wound (redness, swelling, pus), other concern.      Back Pain, Adult Low back pain is very common. About 1 in 5 people have back pain.The cause of low back pain is rarely dangerous. The pain often gets better over time.About half of people with a sudden onset of back pain feel better in just 2 weeks. About 8 in 10 people feel better by 6 weeks.  CAUSES Some common causes of back pain include:  Strain of the muscles or ligaments supporting the spine.  Wear and tear (degeneration) of the spinal discs.  Arthritis.  Direct injury to the back. DIAGNOSIS Most of the time, the direct cause of low back pain is not known.However, back pain can be treated effectively even when the exact cause of the pain is unknown.Answering your caregiver's questions about your overall health and symptoms is one of the most accurate ways to make sure the cause of your pain is not dangerous. If your caregiver needs more information, he or she may order lab work or imaging tests (X-rays or MRIs).However, even if imaging tests show changes in your back, this usually does not require surgery. HOME CARE INSTRUCTIONS For many people, back pain returns.Since low back pain is rarely dangerous, it is often a condition that people can learn to [facility] their own.   Remain active. It is stressful on the back to sit or stand in one place. Do not sit, drive, or stand in one place for more than 30 minutes at a time. Take short walks on level surfaces as soon as pain allows.Try to increase the length of time you walk each day.  Do not stay in bed.Resting  more than 1 or 2 days can delay your recovery.  Do not avoid exercise or work.Your body is made to move.It is not dangerous to be active, even though your back may hurt.Your back will likely heal faster if you return to being active before your pain is gone.  Pay attention to your body when you bend and lift. Many people have less discomfortwhen lifting if they bend their knees, keep the load close to their bodies,and avoid twisting. Often, the most comfortable positions are those that put less stress on your recovering back.  Find a comfortable position to sleep. Use a firm mattress and lie on your side with your knees slightly bent. If you lie on your back, put a pillow under your knees.  Only take over-the-counter or prescription medicines as directed by your caregiver. Over-the-counter medicines to reduce pain and inflammation are often the most helpful.Your caregiver may prescribe muscle relaxant drugs.These medicines help dull your pain so you can more quickly return to your normal activities and healthy exercise.  Put ice on the injured area.  Put ice in a plastic bag.  Place a towel between your skin and the bag.  Leave the ice on for 15-20 minutes, 03-04 times a day for the first 2 to 3 days. After that, ice and heat may be alternated to reduce pain and spasms.  Ask your caregiver about trying back exercises and gentle massage. This may  be of some benefit.  Avoid feeling anxious or stressed.Stress increases muscle tension and can worsen back pain.It is important to recognize when you are anxious or stressed and learn ways to manage it.Exercise is a great option. SEEK MEDICAL CARE IF:  You have pain that is not relieved with rest or medicine.  You have pain that does not improve in 1 week.  You have new symptoms.  You are generally not feeling well. SEEK IMMEDIATE MEDICAL CARE IF:   You have pain that radiates from your back into your legs.  You develop new bowel  or bladder control problems.  You have unusual weakness or numbness in your arms or legs.  You develop nausea or vomiting.  You develop abdominal pain.  You feel faint. Document Released: 07/10/2005 Document Revised: 01/09/2012 Document Reviewed: 11/11/2013 [facility] Patient Information 2015 [place], [place]. This information is not intended to replace advice given to you by your health care provider. Make sure you discuss any questions you have with your health care provider.     Chronic Back Pain  When back pain lasts longer than 3 months, it is called chronic back pain.People with chronic back pain often go through certain periods that are more intense (flare-ups).  CAUSES Chronic back pain can be caused by wear and tear (degeneration) on different structures in your back. These structures include:  The bones of your spine (vertebrae) and the joints surrounding your spinal cord and nerve roots (facets).  The strong, fibrous tissues that connect your vertebrae (ligaments). Degeneration of these structures may result in pressure on your nerves. This can lead to constant pain. HOME CARE INSTRUCTIONS  Avoid bending, heavy lifting, prolonged sitting, and activities which make the problem worse.  Take brief periods of rest throughout the day to reduce your pain. Lying down or standing usually is better than sitting while you are resting.  Take over-the-counter or prescription medicines only as directed by your caregiver. SEEK IMMEDIATE MEDICAL CARE IF:   You have weakness or numbness in one of your legs or feet.  You have trouble controlling your bladder or bowels.  You have nausea, vomiting, abdominal pain, shortness of breath, or fainting. Document Released: 08/17/2004 Document Revised: 10/02/2011 Document Reviewed: 06/24/2011 [facility] Patient Information 2015 [place], [place]. This information is not intended to replace advice given to you by your health care provider. Make sure  you discuss any questions you have with your health care provider.

## 2014-07-01 ENCOUNTER — Other Ambulatory Visit: Payer: Self-pay | Admitting: Internal Medicine

## 2014-07-01 DIAGNOSIS — M542 Cervicalgia: Secondary | ICD-10-CM | POA: Insufficient documentation

## 2014-07-02 ENCOUNTER — Other Ambulatory Visit: Payer: Self-pay | Admitting: Internal Medicine

## 2014-07-22 ENCOUNTER — Other Ambulatory Visit: Payer: Self-pay | Admitting: Internal Medicine

## 2014-07-31 ENCOUNTER — Emergency Department (HOSPITAL_COMMUNITY): Payer: Managed Care, Other (non HMO)

## 2014-07-31 ENCOUNTER — Emergency Department (HOSPITAL_COMMUNITY)
Admission: EM | Admit: 2014-07-31 | Discharge: 2014-07-31 | Disposition: A | Discharge status: Home / Self Care | Payer: Managed Care, Other (non HMO) | Attending: Emergency Medicine | Admitting: Emergency Medicine

## 2014-07-31 ENCOUNTER — Encounter (HOSPITAL_COMMUNITY): Payer: Self-pay | Admitting: Emergency Medicine

## 2014-07-31 DIAGNOSIS — I10 Essential (primary) hypertension: Secondary | ICD-10-CM | POA: Insufficient documentation

## 2014-07-31 DIAGNOSIS — D649 Anemia, unspecified: Secondary | ICD-10-CM | POA: Insufficient documentation

## 2014-07-31 DIAGNOSIS — Z86010 Personal history of colonic polyps: Secondary | ICD-10-CM | POA: Insufficient documentation

## 2014-07-31 DIAGNOSIS — R109 Unspecified abdominal pain: Secondary | ICD-10-CM | POA: Insufficient documentation

## 2014-07-31 DIAGNOSIS — G8929 Other chronic pain: Secondary | ICD-10-CM | POA: Insufficient documentation

## 2014-07-31 DIAGNOSIS — F329 Major depressive disorder, single episode, unspecified: Secondary | ICD-10-CM | POA: Insufficient documentation

## 2014-07-31 DIAGNOSIS — E785 Hyperlipidemia, unspecified: Secondary | ICD-10-CM | POA: Insufficient documentation

## 2014-07-31 DIAGNOSIS — E538 Deficiency of other specified B group vitamins: Secondary | ICD-10-CM | POA: Insufficient documentation

## 2014-07-31 DIAGNOSIS — F419 Anxiety disorder, unspecified: Secondary | ICD-10-CM | POA: Insufficient documentation

## 2014-07-31 DIAGNOSIS — M1990 Unspecified osteoarthritis, unspecified site: Secondary | ICD-10-CM | POA: Diagnosis not present

## 2014-07-31 DIAGNOSIS — K219 Gastro-esophageal reflux disease without esophagitis: Secondary | ICD-10-CM | POA: Diagnosis not present

## 2014-07-31 DIAGNOSIS — Z720 Tobacco use: Secondary | ICD-10-CM | POA: Diagnosis not present

## 2014-07-31 DIAGNOSIS — M109 Gout, unspecified: Secondary | ICD-10-CM | POA: Diagnosis not present

## 2014-07-31 DIAGNOSIS — Z79899 Other long term (current) drug therapy: Secondary | ICD-10-CM | POA: Insufficient documentation

## 2014-07-31 LAB — URINALYSIS, ROUTINE W REFLEX MICROSCOPIC
Glucose, UA: NEGATIVE mg/dL
Hgb urine dipstick: NEGATIVE
Ketones, ur: 15 mg/dL — AB
LEUKOCYTES UA: NEGATIVE
NITRITE: NEGATIVE
Protein, ur: NEGATIVE mg/dL
Specific Gravity, Urine: 1.025 (ref 1.005–1.030)
UROBILINOGEN UA: 0.2 mg/dL (ref 0.0–1.0)
pH: 5 (ref 5.0–8.0)

## 2014-07-31 LAB — CBC WITH DIFFERENTIAL/PLATELET
Basophils Absolute: 0 10*3/uL (ref 0.0–0.1)
Basophils Relative: 0 % (ref 0–1)
Eosinophils Absolute: 0.2 10*3/uL (ref 0.0–0.7)
Eosinophils Relative: 2 % (ref 0–5)
HCT: 34 % — ABNORMAL LOW (ref 39.0–52.0)
HEMOGLOBIN: 11.3 g/dL — AB (ref 13.0–17.0)
Lymphocytes Relative: 27 % (ref 12–46)
Lymphs Abs: 2.1 10*3/uL (ref 0.7–4.0)
MCH: 29 pg (ref 26.0–34.0)
MCHC: 33.2 g/dL (ref 30.0–36.0)
MCV: 87.2 fL (ref 78.0–100.0)
Monocytes Absolute: 0.6 10*3/uL (ref 0.1–1.0)
Monocytes Relative: 8 % (ref 3–12)
Neutro Abs: 4.7 10*3/uL (ref 1.7–7.7)
Neutrophils Relative %: 63 % (ref 43–77)
Platelets: 272 10*3/uL (ref 150–400)
RBC: 3.9 MIL/uL — AB (ref 4.22–5.81)
RDW: 14.3 % (ref 11.5–15.5)
WBC: 7.6 10*3/uL (ref 4.0–10.5)

## 2014-07-31 LAB — COMPREHENSIVE METABOLIC PANEL
ALK PHOS: 88 U/L (ref 39–117)
ALT: 15 U/L (ref 0–53)
AST: 16 U/L (ref 0–37)
Albumin: 3.8 g/dL (ref 3.5–5.2)
Anion gap: 6 (ref 5–15)
BUN: 11 mg/dL (ref 6–23)
CO2: 28 mmol/L (ref 19–32)
Calcium: 9.2 mg/dL (ref 8.4–10.5)
Chloride: 104 mEq/L (ref 96–112)
Creatinine, Ser: 1.24 mg/dL (ref 0.50–1.35)
GFR calc Af Amer: 73 mL/min — ABNORMAL LOW (ref >90)
GFR, EST NON AFRICAN AMERICAN: 63 mL/min — AB (ref >90)
GLUCOSE: 109 mg/dL — AB (ref 70–99)
POTASSIUM: 3.5 mmol/L (ref 3.5–5.1)
SODIUM: 138 mmol/L (ref 135–145)
TOTAL PROTEIN: 6 g/dL (ref 6.0–8.3)
Total Bilirubin: 0.2 mg/dL — ABNORMAL LOW (ref 0.3–1.2)

## 2014-07-31 LAB — LIPASE, BLOOD: LIPASE: 27 U/L (ref 11–59)

## 2014-07-31 MED ORDER — MORPHINE SULFATE ER 15 MG PO TBCR: 15.0000 mg | EXTENDED_RELEASE_TABLET | Freq: Three times a day (TID) | ORAL | Status: DC | PRN

## 2014-07-31 MED ORDER — HYDROMORPHONE HCL 1 MG/ML IJ SOLN
2.0000 mg | Freq: Once | INTRAMUSCULAR | Status: AC
  Administered 2014-07-31: 2 mg via INTRAVENOUS
  Filled 2014-07-31: qty 2

## 2014-07-31 MED ORDER — ONDANSETRON HCL 4 MG/2ML IJ SOLN
4.0000 mg | Freq: Once | INTRAMUSCULAR | Status: AC
  Administered 2014-07-31: 4 mg via INTRAVENOUS
  Filled 2014-07-31: qty 2

## 2014-07-31 MED ORDER — KETOROLAC TROMETHAMINE 30 MG/ML IJ SOLN
30.0000 mg | Freq: Once | INTRAMUSCULAR | Status: AC
  Administered 2014-07-31: 30 mg via INTRAVENOUS
  Filled 2014-07-31: qty 1

## 2014-07-31 MED ORDER — SODIUM CHLORIDE 0.9 % IV BOLUS (SEPSIS)
1000.0000 mL | Freq: Once | INTRAVENOUS | Status: AC
  Administered 2014-07-31: 1000 mL via INTRAVENOUS

## 2014-07-31 MED ORDER — LORAZEPAM 2 MG/ML IJ SOLN
1.0000 mg | Freq: Once | INTRAMUSCULAR | Status: AC
  Administered 2014-07-31: 1 mg via INTRAVENOUS
  Filled 2014-07-31: qty 1

## 2014-07-31 MED ORDER — HYDROMORPHONE HCL 2 MG PO TABS: 4.0000 mg | ORAL_TABLET | ORAL | Status: DC | PRN

## 2014-07-31 NOTE — ED Provider Notes (Signed)
CSN: [id number]     Arrival date & time 07/31/14  1947 History   First MD Initiated Contact with Patient 07/31/14 2007     Chief Complaint  Patient presents with  . Flank Pain     (Consider location/radiation/quality/duration/timing/severity/associated sxs/prior Treatment) HPI Comments: Patient is a 59 year old male with a past medical history of hypertension, HLD, and s/p lumbar spine fusion 3 months ago who presents with left flank pain that started 2 days ago. Symptoms started gradually and progressively worsened since the onset. The pain is aching and severe without radiation. Active movement makes the pain worse. No alleviating factors. Patient denies any injury. Patient has taken morphine and dilaudid at home which has not helped with his pain. No other associated symptoms. No bladder/bowel incontinence or saddle paresthesias. Patient's Neurosurgeon is Dr. Vertell Limber.    Past Medical History  Diagnosis Date  . Diverticulosis of colon   . GERD (gastroesophageal reflux disease)   . Cervical disc disease     a. 07/2003 ant cervical deomcpression and fusion C5-6/C6-7;  b. 09/2007 post cervical laminectomy C4-5 with scres and arthrodesis C4-C7.  Marland Kitchen Sleep apnea   . Arthritis   . Anxiety   . Hypertension   . Colon polyps   . B12 deficiency   . Anemia   . Hyperlipidemia   . Gout   . Pleurisy   . Tobacco abuse     a. 40 yrs, 1.5-3 ppd over that time.  . Chronic lower back pain   . Complication of anesthesia     " i WOKE UP DURING A COLONOSCOPY "  . Chest pain at rest 02/25/2013  . Depression    Past Surgical History  Procedure Laterality Date  . Rotator cuff repair  August 2000, January 2002, July 2008, July 2009    2 left 2 right  . Neck surgery  January 2005, March 2009    C-Spine  . Cervical disc surgery      X 2  . Elbow arthroscopy Right   . Carpal tunnel release Right   . Colonoscopy    . Lumbar laminectomy/decompression microdiscectomy Right 07/11/2013    Procedure: LUMBAR  LAMINECTOMY/DECOMPRESSION MICRODISCECTOMY 1 LEVEL;  Surgeon: Erline Levine, MD;  Location: [facility];  Service: Neurosurgery;  Laterality: Right;  Right L34 microdiskectomy  . Knee arthroscopy Right 08/2013    torn menicus  . Knee arthroplasty Left 11/2013    chip cartlidge,torn menicus  . Anterior lateral lumbar fusion 4 levels Right 06/05/2014    Procedure: ANTERIOR LATERAL LUMBAR FUSION  LUMBAR ONE TO LUMBAR FIVE with Percutaneous Pedicle Screws;  Surgeon: Erline Levine, MD;  Location: [facility];  Service: Neurosurgery;  Laterality: Right;  . Lumbar percutaneous pedicle screw 4 level N/A 06/05/2014    Procedure: LUMBAR PERCUTANEOUS PEDICLE SCREW 4 LEVEL;  Surgeon: Erline Levine, MD;  Location: [facility];  Service: Neurosurgery;  Laterality: N/A;   Family History  Problem Relation Age of Onset  . Bone cancer Mother   . Colon polyps Mother   . Squamous cell carcinoma Mother     died @ 54  . Hypertension Father   . Colon polyps Father   . Diabetes Father     borderline  . Congestive Heart Failure Father     alive @ 13  . Sudden death Brother     died @ 55  . Other Brother     died in [place] @ 2 - struck by drunk driver  .  Other Brother     died in [place] @ 32 - struck by drunk driver  . Heart attack Mother   . Heart failure Father   . Heart attack Brother   . Stroke Paternal Grandfather    History  Substance Use Topics  . Smoking status: Heavy Tobacco Smoker -- 0.50 packs/day for 40 years    Types: Cigarettes    Last Attempt to Quit: 02/24/2013  . Smokeless tobacco: Never Used     Comment: Currently smoking 1.5 ppd.  Has previously smoked up to 3 ppd and did so for about 20 yrs.  . Alcohol Use: Yes     Comment: at least 3 beers/night.    Review of Systems  Constitutional: Negative for fever, chills and fatigue.  HENT: Negative for trouble swallowing.   Eyes: Negative for visual disturbance.  Respiratory: Negative for shortness of breath.   Cardiovascular: Negative for  chest pain and palpitations.  Gastrointestinal: Negative for nausea, vomiting, abdominal pain and diarrhea.  Genitourinary: Positive for flank pain. Negative for dysuria and difficulty urinating.  Musculoskeletal: Negative for arthralgias and neck pain.  Skin: Negative for color change.  Neurological: Negative for dizziness and weakness.  Psychiatric/Behavioral: Negative for dysphoric mood.      Allergies  Percodan  Home Medications   Prior to Admission medications   Medication Sig Start Date End Date Taking? Authorizing Provider  ALPRAZolam Duanne Moron) 0.5 MG tablet TAKE 1 TABLET BY MOUTH 3 TIMES A DAY 07/22/14   Marletta Lor, MD  colchicine 0.6 MG tablet Take 0.6 mg by mouth daily as needed (GOUT).  11/21/13   Historical Provider, MD  cyanocobalamin (,VITAMIN B-12,) 1000 MCG/ML injection inject 1 milliliter INTO THE MUSCLE EVERY 30 DAYS 03/16/14   Marletta Lor, MD  cyclobenzaprine (FLEXERIL) 10 MG tablet Take 10 mg by mouth 2 (two) times daily as needed for muscle spasms.  12/08/13   Historical Provider, MD  diazepam (VALIUM) 5 MG tablet Take 5 mg by mouth every 6 (six) hours as needed for anxiety.    Historical Provider, MD  diltiazem (CARDIZEM CD) 240 MG 24 hr capsule TAKE ONE CAPSULE BY MOUTH EVERY DAY 07/02/14   Marletta Lor, MD  diltiazem (DILACOR XR) 240 MG 24 hr capsule Take 240 mg by mouth daily.    Historical Provider, MD  HYDROcodone-acetaminophen (NORCO/VICODIN) 5-325 MG per tablet Take 1 tablet by mouth every 6 (six) hours as needed for moderate pain. 07/11/13   Ashok Pall, MD  HYDROmorphone (DILAUDID) 4 MG tablet Take 4 mg by mouth every 4 (four) hours as needed for moderate pain or severe pain.    Historical Provider, MD  lisinopril (PRINIVIL,ZESTRIL) 20 MG tablet TAKE 1 TABLET BY MOUTH EVERY DAY 04/20/14   Marletta Lor, MD  lisinopril (PRINIVIL,ZESTRIL) 20 MG tablet TAKE 1 TABLET BY MOUTH EVERY DAY 07/02/14   Marletta Lor, MD   methylPREDNISolone (MEDROL DOSEPAK) 4 MG tablet Take as directed 06/22/14   Mirna Mires, MD  morphine (MS CONTIN) 15 MG 12 hr tablet Take 1 tablet (15 mg total) by mouth every 8 (eight) hours as needed for pain. 06/22/14   Mirna Mires, MD  omeprazole (PRILOSEC OTC) 20 MG tablet Take 2 tablets (40 mg total) by mouth daily. 02/27/13   Sheila Oats, MD  sertraline (ZOLOFT) 50 MG tablet TAKE 1 TABLET (50 MG TOTAL) BY MOUTH DAILY. 07/02/14   Marletta Lor, MD  simvastatin (ZOCOR) 20 MG tablet TAKE 1 TABLET  BY MOUTH AT BEDTIME 07/02/14   Marletta Lor, MD  Syringe/Needle, Disp, (B-D ECLIPSE SYRINGE) 30G X 1/2" 1 ML MISC every 30 (thirty) days.      Historical Provider, MD  tiZANidine (ZANAFLEX) 4 MG tablet Take 4 mg by mouth every 6 (six) hours as needed for muscle spasms.    Historical Provider, MD   BP 125/73 mmHg  Pulse 74  Temp(Src) 98.2 F (36.8 C) (Oral)  Resp 22  Ht 5\' 7"  (1.702 m)  Wt 155 lb (70.308 kg)  BMI 24.27 kg/m2  SpO2 97% Physical Exam  Constitutional: He is oriented to person, place, and time. He appears well-developed and well-nourished. No distress.  HENT:  Head: Normocephalic and atraumatic.  Eyes: Conjunctivae and EOM are normal.  Neck: Normal range of motion.  Cardiovascular: Normal rate and regular rhythm.  Exam reveals no gallop and no friction rub.   No murmur heard. Pulmonary/Chest: Effort normal and breath sounds normal. He has no wheezes. He has no rales. He exhibits no tenderness.  Abdominal: Soft. He exhibits no distension. There is no tenderness. There is no rebound.  Genitourinary:  No CVA tenderness  Musculoskeletal: Normal range of motion.  No midline spine tenderness to palpation. Well-healed vertical surgical scar in the lumbar spine area. Older healed surgical scar overlying the cervical spine.   Neurological: He is alert and oriented to person, place, and time. Coordination normal.  Speech is goal-oriented. Moves limbs without  ataxia.   Skin: Skin is warm and dry.  Psychiatric: He has a normal mood and affect. His behavior is normal.  Nursing note and vitals reviewed.   ED Course  Procedures (including critical care time) Labs Review Labs Reviewed  CBC WITH DIFFERENTIAL - Abnormal; Notable for the following:    RBC 3.90 (*)    Hemoglobin 11.3 (*)    HCT 34.0 (*)    All other components within normal limits  COMPREHENSIVE METABOLIC PANEL - Abnormal; Notable for the following:    Glucose, Bld 109 (*)    Total Bilirubin 0.2 (*)    GFR calc non Af Amer 63 (*)    GFR calc Af Amer 73 (*)    All other components within normal limits  URINALYSIS, ROUTINE W REFLEX MICROSCOPIC - Abnormal; Notable for the following:    Color, Urine AMBER (*)    APPearance CLOUDY (*)    Bilirubin Urine SMALL (*)    Ketones, ur 15 (*)    All other components within normal limits  LIPASE, BLOOD    Imaging Review Ct Abdomen Pelvis Wo Contrast  07/31/2014   CLINICAL DATA:  59 year old male with left-sided flank pain for the past 2 days.  EXAM: CT ABDOMEN AND PELVIS WITHOUT CONTRAST  TECHNIQUE: Multidetector CT imaging of the abdomen and pelvis was performed following the standard protocol without IV contrast.  COMPARISON:  No priors.  FINDINGS: Lower chest:  Unremarkable.  Hepatobiliary: No definite cystic or solid hepatic lesions identified on today's non contrast CT examination. Gallbladder is normal in appearance.  Pancreas: Unremarkable.  Spleen: Unremarkable.  Adrenals/Urinary Tract: There are no abnormal calcifications within the collecting system of either kidney, along the course of either ureter, or within the lumen of the urinary bladder. Mild bilateral perinephric stranding (nonspecific). No hydroureteronephrosis to suggest urinary tract obstruction at this time. The unenhanced appearance of the kidneys is unremarkable bilaterally. Normal adrenal glands.  Stomach/Bowel: The unenhanced appearance of the stomach is normal. No  pathologic dilatation of small bowel or  colon. Numerous colonic diverticulae are noted, without surrounding inflammatory changes to suggest an acute diverticulitis at this time. Normal appendix.  Vascular/Lymphatic: Atherosclerosis throughout the abdominal and pelvic vasculature, without evidence of aneurysm. No lymphadenopathy noted in the abdomen or pelvis on today's non contrast CT examination.  Reproductive: Prostate gland and seminal vesicles are unremarkable in appearance.  Other: No significant volume of ascites.  No pneumoperitoneum.  Musculoskeletal: Postoperative changes of PLIF from L1-L5. There are no aggressive appearing lytic or blastic lesions noted in the visualized portions of the skeleton.  IMPRESSION: 1. No acute findings in the abdomen or pelvis to account for the patient's symptoms. Specifically, no urinary tract calculi or findings of urinary tract obstruction. 2. Normal appendix. 3. Postoperative changes of PLIF at L1-L5. 4. Atherosclerosis.   Electronically Signed   By: Vinnie Langton M.D.   On: 07/31/2014 22:13     EKG Interpretation None      MDM   Final diagnoses:  Flank pain    8:17 PM Labs and urinalysis pending. Vitals stable and patient afebrile. Patient will have fluids, dilaudid, toradol, and zofran for symptoms.   9:37 PM CT abdomen/pelvis without contrast pending to evaluate for kidney stone.   11:22 PM Patient's CT unremarkable for acute changes. Dr. Alvino Chapel saw the patient and thinks he is having acute exacerbation of chronic back pain. Patient will have additional pain medication here and be discharged. Patient has a follow up in 3 days with Neurosurgery. Patient instructed to return with worsening or concerning symptoms.     Alvina Chou, PA-C 07/31/14 [place] Alvino Chapel, MD 08/01/14 2248

## 2014-07-31 NOTE — ED Notes (Signed)
Pt attempting to use urinal.

## 2014-07-31 NOTE — Discharge Instructions (Signed)
Take dilaudid and morphine as needed for pain. Refer to attached documents for more information. Return to the ED with worsening or concerning symptoms. Follow up with your Neurosurgeon as scheduled.

## 2014-07-31 NOTE — ED Notes (Signed)
Pt attempted to use urinal; unable to at this time

## 2014-07-31 NOTE — ED Notes (Signed)
Per EMS, pt has been have left sided flank pain x 2 days. Pt has hx of lumbar spine surgery in November. Denies injury or trauma. Pt has taken morphine and dialudid at home. Pt received 196mcg of Fentanyl en route.

## 2014-11-01 ENCOUNTER — Other Ambulatory Visit: Payer: Self-pay | Admitting: Internal Medicine

## 2014-11-23 ENCOUNTER — Ambulatory Visit (INDEPENDENT_AMBULATORY_CARE_PROVIDER_SITE_OTHER): Payer: Managed Care, Other (non HMO) | Admitting: Family Medicine

## 2014-11-23 ENCOUNTER — Encounter: Payer: Self-pay | Admitting: Family Medicine

## 2014-11-23 VITALS — BP 130/82 | HR 93 | Temp 98.5°F | Ht 67.0 in | Wt 160.1 lb

## 2014-11-23 DIAGNOSIS — T148 Other injury of unspecified body region: Secondary | ICD-10-CM

## 2014-11-23 DIAGNOSIS — W57XXXA Bitten or stung by nonvenomous insect and other nonvenomous arthropods, initial encounter: Secondary | ICD-10-CM | POA: Diagnosis not present

## 2014-11-23 DIAGNOSIS — L03313 Cellulitis of chest wall: Secondary | ICD-10-CM

## 2014-11-23 MED ORDER — DOXYCYCLINE HYCLATE 100 MG PO TABS: 100.0000 mg | ORAL_TABLET | Freq: Two times a day (BID) | ORAL | Status: DC

## 2014-11-23 NOTE — Progress Notes (Signed)
HPI:  Aaron Black is a pleasant 59 yo M, patient of Dr. Raliegh Ip, with PMH sig for anxiety here for an acute visit for:  Tick bite: -reports: sore and itchy area on L chest for 2 days, thinks a tick is biting here -denies: fevers, rash, chills, malaise, flu like symptoms   ROS: See pertinent positives and negatives per HPI.  Past Medical History  Diagnosis Date  . Diverticulosis of colon   . GERD (gastroesophageal reflux disease)   . Cervical disc disease     a. 07/2003 ant cervical deomcpression and fusion C5-6/C6-7;  b. 09/2007 post cervical laminectomy C4-5 with scres and arthrodesis C4-C7.  Marland Kitchen Sleep apnea   . Arthritis   . Anxiety   . Hypertension   . Colon polyps   . B12 deficiency   . Anemia   . Hyperlipidemia   . Gout   . Pleurisy   . Tobacco abuse     a. 40 yrs, 1.5-3 ppd over that time.  . Chronic lower back pain   . Complication of anesthesia     " i WOKE UP DURING A COLONOSCOPY "  . Chest pain at rest 02/25/2013  . Depression     Past Surgical History  Procedure Laterality Date  . Rotator cuff repair  August 2000, January 2002, July 2008, July 2009    2 left 2 right  . Neck surgery  January 2005, March 2009    C-Spine  . Cervical disc surgery      X 2  . Elbow arthroscopy Right   . Carpal tunnel release Right   . Colonoscopy    . Lumbar laminectomy/decompression microdiscectomy Right 07/11/2013    Procedure: LUMBAR LAMINECTOMY/DECOMPRESSION MICRODISCECTOMY 1 LEVEL;  Surgeon: Erline Levine, MD;  Location: [facility];  Service: Neurosurgery;  Laterality: Right;  Right L34 microdiskectomy  . Knee arthroscopy Right 08/2013    torn menicus  . Knee arthroplasty Left 11/2013    chip cartlidge,torn menicus  . Anterior lateral lumbar fusion 4 levels Right 06/05/2014    Procedure: ANTERIOR LATERAL LUMBAR FUSION  LUMBAR ONE TO LUMBAR FIVE with Percutaneous Pedicle Screws;  Surgeon: Erline Levine, MD;  Location: [facility];  Service: Neurosurgery;  Laterality: Right;   . Lumbar percutaneous pedicle screw 4 level N/A 06/05/2014    Procedure: LUMBAR PERCUTANEOUS PEDICLE SCREW 4 LEVEL;  Surgeon: Erline Levine, MD;  Location: [facility];  Service: Neurosurgery;  Laterality: N/A;    Family History  Problem Relation Age of Onset  . Bone cancer Mother   . Colon polyps Mother   . Squamous cell carcinoma Mother     died @ 65  . Hypertension Father   . Colon polyps Father   . Diabetes Father     borderline  . Congestive Heart Failure Father     alive @ 50  . Sudden death Brother     died @ 28  . Other Brother     died in [place] @ 60 - struck by drunk driver  . Other Brother     died in [place] @ 30 - struck by drunk driver  . Heart attack Mother   . Heart failure Father   . Heart attack Brother   . Stroke Paternal Grandfather     History   Social History  . Marital Status: Married    Spouse Name: N/A  . Number of Children: N/A  . Years of Education: N/A   Social History Main Topics  .  Smoking status: Heavy Tobacco Smoker -- 0.50 packs/day for 40 years    Types: Cigarettes    Last Attempt to Quit: 02/24/2013  . Smokeless tobacco: Never Used     Comment: Currently smoking 1.5 ppd.  Has previously smoked up to 3 ppd and did so for about 20 yrs.  . Alcohol Use: Yes     Comment: at least 3 beers/night.  . Drug Use: No  . Sexual Activity: Not on file   Other Topics Concern  . None   Social History Narrative   Lives in [place] with wife.  Does not work.  Previously read H2O meters for city of [place].     Current outpatient prescriptions:  .  ALPRAZolam (XANAX) 0.5 MG tablet, TAKE 1 TABLET BY MOUTH 3 TIMES A DAY, Disp: 90 tablet, Rfl: 2 .  colchicine 0.6 MG tablet, Take 0.6 mg by mouth daily as needed (GOUT). , Disp: , Rfl:  .  cyanocobalamin (,VITAMIN B-12,) 1000 MCG/ML injection, inject 1 milliliter INTO THE MUSCLE EVERY 30 DAYS, Disp: 10 mL, Rfl: 2 .  cyclobenzaprine (FLEXERIL) 10 MG tablet, Take 10 mg by mouth 2 (two) times daily as needed for  muscle spasms. , Disp: , Rfl:  .  diazepam (VALIUM) 5 MG tablet, Take 5 mg by mouth every 6 (six) hours as needed for anxiety., Disp: , Rfl:  .  diltiazem (CARDIZEM CD) 240 MG 24 hr capsule, TAKE ONE CAPSULE BY MOUTH EVERY DAY, Disp: 90 capsule, Rfl: 1 .  diltiazem (DILACOR XR) 240 MG 24 hr capsule, Take 240 mg by mouth daily., Disp: , Rfl:  .  gabapentin (NEURONTIN) 100 MG capsule, Take by mouth daily. , Disp: , Rfl: 3 .  HYDROcodone-acetaminophen (NORCO/VICODIN) 5-325 MG per tablet, Take 1 tablet by mouth every 6 (six) hours as needed for moderate pain., Disp: 70 tablet, Rfl: 0 .  lisinopril (PRINIVIL,ZESTRIL) 20 MG tablet, TAKE 1 TABLET BY MOUTH EVERY DAY, Disp: 90 tablet, Rfl: 1 .  lisinopril (PRINIVIL,ZESTRIL) 20 MG tablet, TAKE 1 TABLET BY MOUTH EVERY DAY, Disp: 90 tablet, Rfl: 1 .  methylPREDNISolone (MEDROL DOSEPAK) 4 MG tablet, Take as directed, Disp: 21 tablet, Rfl: 0 .  morphine (MS CONTIN) 15 MG 12 hr tablet, Take 1 tablet (15 mg total) by mouth every 8 (eight) hours as needed for pain., Disp: 15 tablet, Rfl: 0 .  omeprazole (PRILOSEC OTC) 20 MG tablet, Take 2 tablets (40 mg total) by mouth daily., Disp: , Rfl:  .  sertraline (ZOLOFT) 50 MG tablet, TAKE 1 TABLET (50 MG TOTAL) BY MOUTH DAILY., Disp: 90 tablet, Rfl: 1 .  simvastatin (ZOCOR) 20 MG tablet, TAKE 1 TABLET BY MOUTH AT BEDTIME, Disp: 90 tablet, Rfl: 1 .  Syringe/Needle, Disp, (B-D ECLIPSE SYRINGE) 30G X 1/2" 1 ML MISC, every 30 (thirty) days.  , Disp: , Rfl:  .  tiZANidine (ZANAFLEX) 4 MG tablet, Take 4 mg by mouth every 6 (six) hours as needed for muscle spasms., Disp: , Rfl:  .  doxycycline (VIBRA-TABS) 100 MG tablet, Take 1 tablet (100 mg total) by mouth 2 (two) times daily., Disp: 20 tablet, Rfl: 0  EXAM:  Filed Vitals:   11/23/14 1250  BP: 130/82  Pulse: 93  Temp: 98.5 F (36.9 C)    Body mass index is 25.07 kg/(m^2).  GENERAL: vitals reviewed and listed above, alert, oriented, appears well hydrated and in no  acute distress  HEENT: atraumatic, conjunttiva clear, no obvious abnormalities on inspection of external nose and  ears  NECK: no obvious masses on inspection  SKIN: erythematous papule on L chest with small surrounding area approx 4 cm in diameter of erythema and induration  MS: moves all extremities without noticeable abnormality  PSYCH: pleasant and cooperative, no obvious depression or anxiety  ASSESSMENT AND PLAN:  Discussed the following assessment and plan:  Tick bite  Cellulitis of chest wall - Plan: doxycycline (VIBRA-TABS) 100 MG tablet  -bite site care recs discussed - he is very concerned for infection - suspect reaction but will cover for mild cellulitis with doxy -discussed signs and symptoms of tick borne illness (none present at this time) and return precautions -follow up as needed -Patient advised to return or notify a doctor immediately if symptoms worsen or persist or new concerns arise.  There are no Patient Instructions on file for this visit.   Colin Benton R.

## 2014-11-23 NOTE — Progress Notes (Signed)
Pre visit review using our clinic review tool, if applicable. No additional management support is needed unless otherwise documented below in the visit note. 

## 2014-11-23 NOTE — Patient Instructions (Signed)
Tick Bite Information Ticks are insects that attach themselves to the skin and draw blood for food. There are various types of ticks. Common types include wood ticks and deer ticks. Most ticks live in shrubs and grassy areas. Ticks can climb onto your body when you make contact with leaves or grass where the tick is waiting. The most common places on the body for ticks to attach themselves are the scalp, neck, armpits, waist, and groin. Most tick bites are harmless, but sometimes ticks carry germs that cause diseases. These germs can be spread to a person during the tick's feeding process. The chance of a disease spreading through a tick bite depends on:   The type of tick.  Time of year.   How long the tick is attached.   Geographic location.  HOW CAN YOU PREVENT TICK BITES? Take these steps to help prevent tick bites when you are outdoors:  Wear protective clothing. Long sleeves and long pants are best.   Wear white clothes so you can see ticks more easily.  Tuck your pant legs into your socks.   If walking on a trail, stay in the middle of the trail to avoid brushing against bushes.  Avoid walking through areas with long grass.  Put insect repellent on all exposed skin and along boot tops, pant legs, and sleeve cuffs.   Check clothing, hair, and skin repeatedly and before going inside.   Brush off any ticks that are not attached.  Take a shower or bath as soon as possible after being outdoors.  WHAT IS THE PROPER WAY TO REMOVE A TICK? Ticks should be removed as soon as possible to help prevent diseases caused by tick bites. 1. If latex gloves are available, put them on before trying to remove a tick.  2. Using fine-point tweezers, grasp the tick as close to the skin as possible. You may also use curved forceps or a tick removal tool. Grasp the tick as close to its head as possible. Avoid grasping the tick on its body. 3. Pull gently with steady upward pressure until  the tick lets go. Do not twist the tick or jerk it suddenly. This may break off the tick's head or mouth parts. 4. Do not squeeze or crush the tick's body. This could force disease-carrying fluids from the tick into your body.  5. After the tick is removed, wash the bite area and your hands with soap and water or other disinfectant such as alcohol. 6. Apply a small amount of antiseptic cream or ointment to the bite site.  7. Wash and disinfect any instruments that were used.  Do not try to remove a tick by applying a hot match, petroleum jelly, or fingernail polish to the tick. These methods do not work and may increase the chances of disease being spread from the tick bite.  WHEN SHOULD YOU SEEK MEDICAL CARE? Contact your health care provider if you are unable to remove a tick from your skin or if a part of the tick breaks off and is stuck in the skin.  After a tick bite, you need to be aware of signs and symptoms that could be related to diseases spread by ticks. Contact your health care provider if you develop any of the following in the days or weeks after the tick bite:  Unexplained fever.  Rash. A circular rash that appears days or weeks after the tick bite may indicate the possibility of Lyme disease. The rash may resemble   a target with a bull's-eye and may occur at a different part of your body than the tick bite.  Redness and swelling in the area of the tick bite.   Tender, swollen lymph glands.   Diarrhea.   Weight loss.   Cough.   Fatigue.   Muscle, joint, or bone pain.   Abdominal pain.   Headache.   Lethargy or a change in your level of consciousness.  Difficulty walking or moving your legs.   Numbness in the legs.   Paralysis.  Shortness of breath.   Confusion.   Repeated vomiting.  Document Released: 07/07/2000 Document Revised: 04/30/2013 Document Reviewed: 12/18/2012 [employer] Patient Information 2015 [employer]. This information is  not intended to replace advice given to you by your health care provider. Make sure you discuss any questions you have with your health care provider.  

## 2014-12-25 ENCOUNTER — Other Ambulatory Visit: Payer: Self-pay | Admitting: Internal Medicine

## 2014-12-30 ENCOUNTER — Other Ambulatory Visit: Payer: Self-pay | Admitting: Internal Medicine

## 2015-02-07 ENCOUNTER — Other Ambulatory Visit: Payer: Self-pay | Admitting: Internal Medicine

## 2015-03-19 ENCOUNTER — Other Ambulatory Visit (INDEPENDENT_AMBULATORY_CARE_PROVIDER_SITE_OTHER): Payer: Managed Care, Other (non HMO)

## 2015-03-19 DIAGNOSIS — Z0000 Encounter for general adult medical examination without abnormal findings: Secondary | ICD-10-CM

## 2015-03-19 LAB — CBC WITH DIFFERENTIAL/PLATELET
Basophils Absolute: 0 10*3/uL (ref 0.0–0.1)
Basophils Relative: 0.4 % (ref 0.0–3.0)
EOS PCT: 1.2 % (ref 0.0–5.0)
Eosinophils Absolute: 0.1 10*3/uL (ref 0.0–0.7)
HCT: 37.5 % — ABNORMAL LOW (ref 39.0–52.0)
HEMOGLOBIN: 12.6 g/dL — AB (ref 13.0–17.0)
LYMPHS ABS: 1.7 10*3/uL (ref 0.7–4.0)
LYMPHS PCT: 24.4 % (ref 12.0–46.0)
MCHC: 33.8 g/dL (ref 30.0–36.0)
MCV: 92.5 fl (ref 78.0–100.0)
Monocytes Absolute: 0.5 10*3/uL (ref 0.1–1.0)
Monocytes Relative: 6.4 % (ref 3.0–12.0)
Neutro Abs: 4.8 10*3/uL (ref 1.4–7.7)
Neutrophils Relative %: 67.6 % (ref 43.0–77.0)
Platelets: 248 10*3/uL (ref 150.0–400.0)
RBC: 4.05 Mil/uL — AB (ref 4.22–5.81)
RDW: 14 % (ref 11.5–15.5)
WBC: 7 10*3/uL (ref 4.0–10.5)

## 2015-03-19 LAB — POCT URINALYSIS DIPSTICK
BILIRUBIN UA: NEGATIVE
GLUCOSE UA: NEGATIVE
Ketones, UA: NEGATIVE
Leukocytes, UA: NEGATIVE
Nitrite, UA: NEGATIVE
Protein, UA: NEGATIVE
RBC UA: NEGATIVE
SPEC GRAV UA: 1.015
Urobilinogen, UA: 0.2
pH, UA: 5

## 2015-03-19 LAB — HEPATIC FUNCTION PANEL
ALBUMIN: 4.6 g/dL (ref 3.5–5.2)
ALT: 25 U/L (ref 0–53)
AST: 34 U/L (ref 0–37)
Alkaline Phosphatase: 93 U/L (ref 39–117)
Bilirubin, Direct: 0.1 mg/dL (ref 0.0–0.3)
Total Bilirubin: 0.4 mg/dL (ref 0.2–1.2)
Total Protein: 7 g/dL (ref 6.0–8.3)

## 2015-03-19 LAB — LIPID PANEL
CHOL/HDL RATIO: 4
Cholesterol: 217 mg/dL — ABNORMAL HIGH (ref 0–200)
HDL: 60.8 mg/dL (ref >39.00)
LDL CALC: 135 mg/dL — AB (ref 0–99)
NonHDL: 156.64
Triglycerides: 106 mg/dL (ref 0.0–149.0)
VLDL: 21.2 mg/dL (ref 0.0–40.0)

## 2015-03-19 LAB — BASIC METABOLIC PANEL
BUN: 25 mg/dL — ABNORMAL HIGH (ref 6–23)
CO2: 25 mEq/L (ref 19–32)
Calcium: 10.1 mg/dL (ref 8.4–10.5)
Chloride: 108 mEq/L (ref 96–112)
Creatinine, Ser: 1.39 mg/dL (ref 0.40–1.50)
GFR: 55.85 mL/min — AB (ref >60.00)
GLUCOSE: 96 mg/dL (ref 70–99)
POTASSIUM: 5.9 meq/L — AB (ref 3.5–5.1)
SODIUM: 143 meq/L (ref 135–145)

## 2015-03-19 LAB — TSH: TSH: 0.31 u[IU]/mL — ABNORMAL LOW (ref 0.35–4.50)

## 2015-03-19 LAB — PSA: PSA: 0.41 ng/mL (ref 0.10–4.00)

## 2015-03-23 IMAGING — CT CT ABD-PELV W/O CM
2 of 4 series · 5 of 46 positions shown, 7 images · non-contrast
Comparison: No priors.

CLINICAL DATA: 59-year-old male with left-sided flank pain for the
past 2 days.

EXAM:
CT ABDOMEN AND PELVIS WITHOUT CONTRAST
TECHNIQUE: Multidetector CT imaging of the abdomen and pelvis was performed
following the standard protocol without IV contrast.

[Series 204: cor · coronal · 0.50mm/px · 4 of 128 slices shown, 5 images]
[im 29/128  soft-tissue]
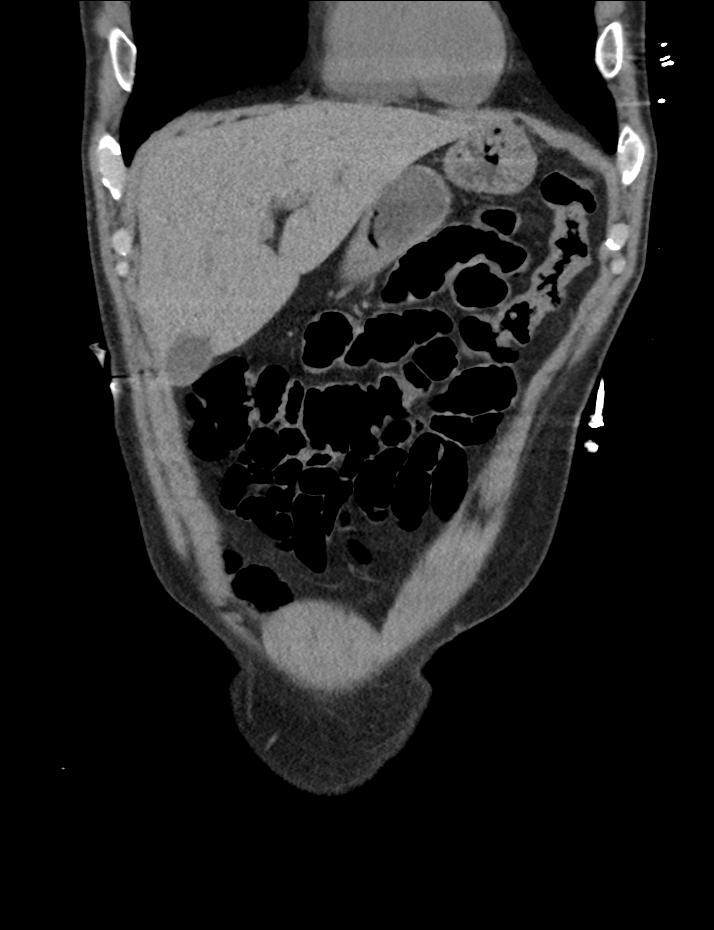
[im 29/128  bone]
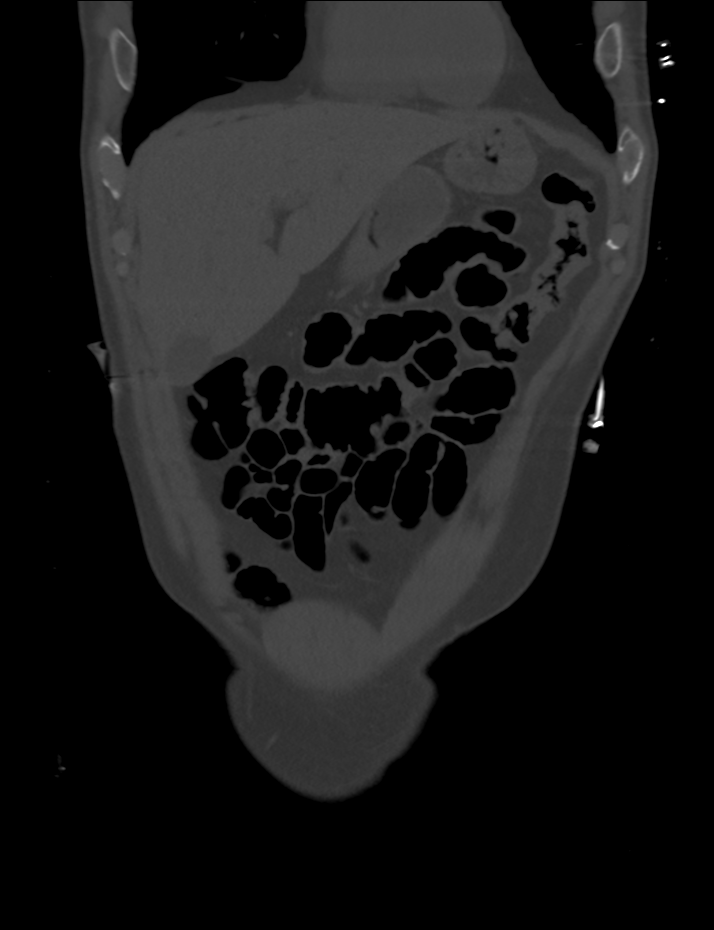
[im 57/128  soft-tissue]
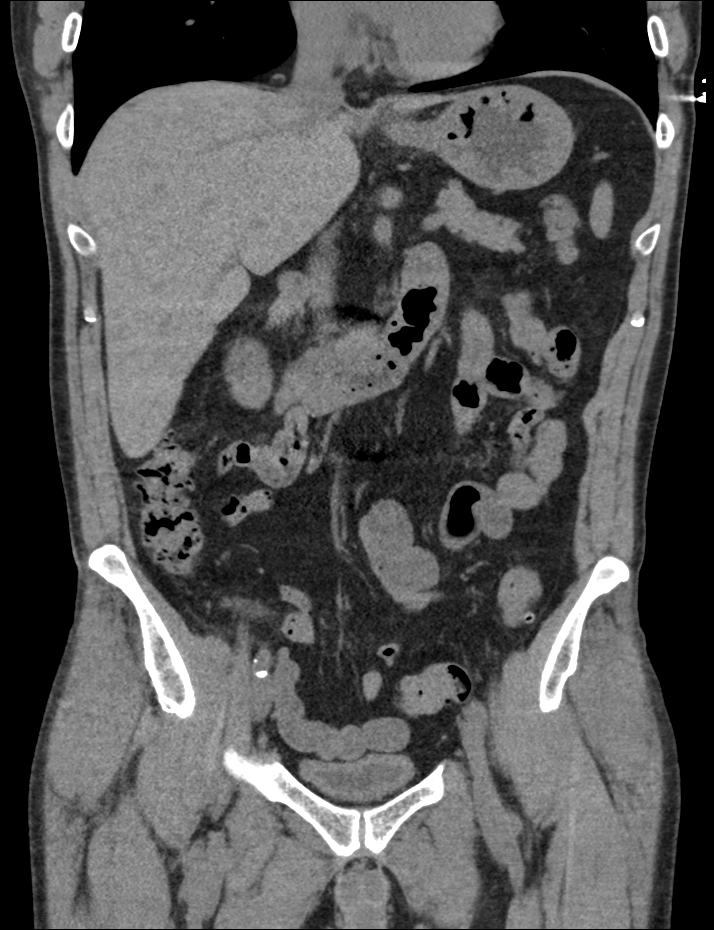
[im 85/128  soft-tissue]
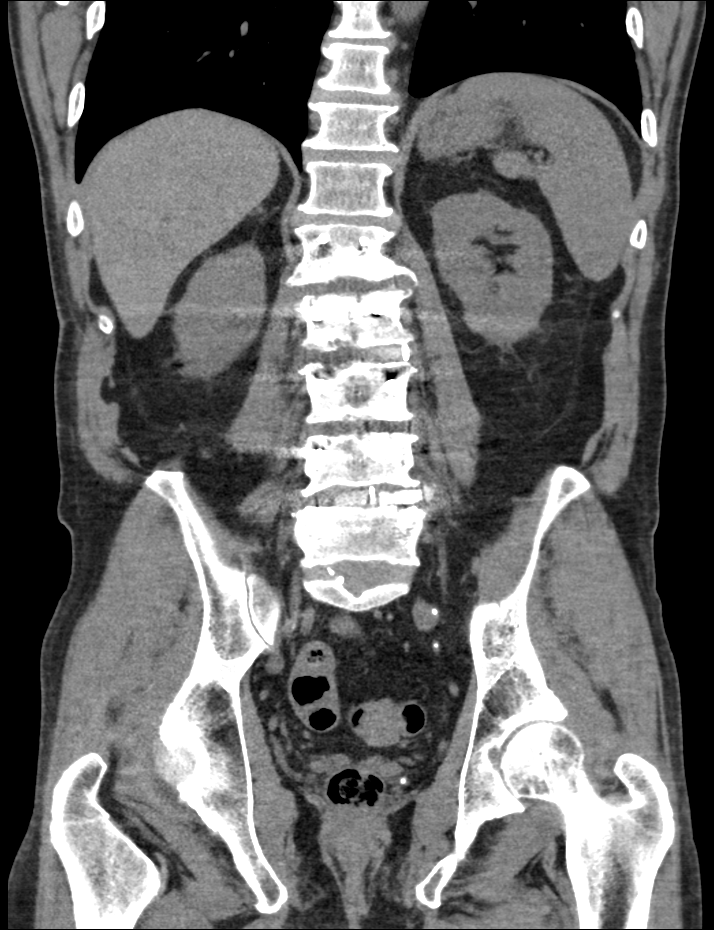
[im 113/128  soft-tissue]
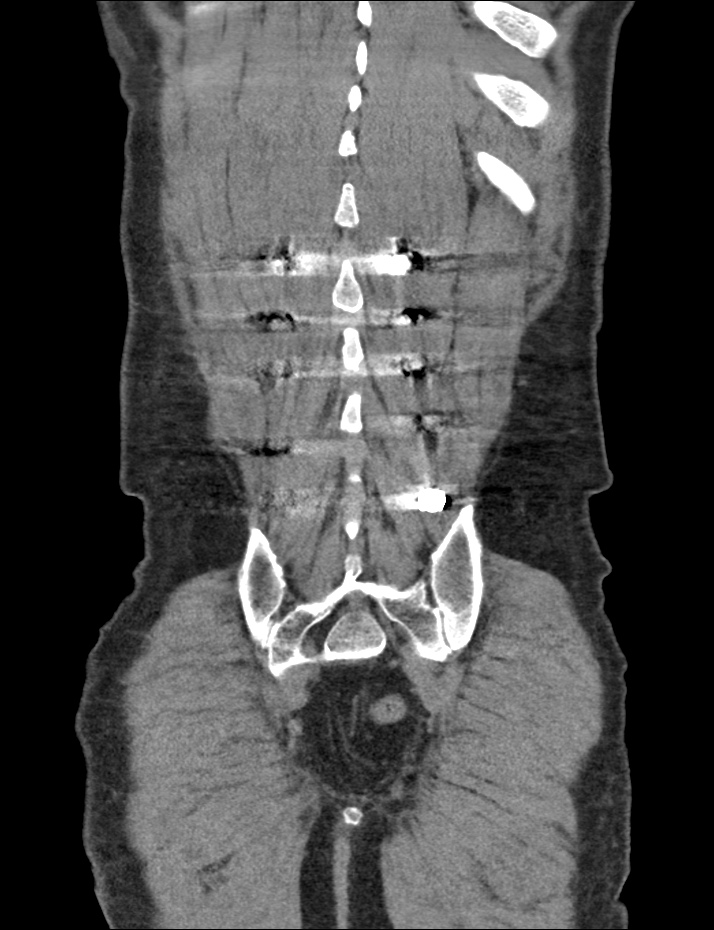

[Series 205: sag · sagittal · 0.50mm/px · 1 of 163 slices shown, 2 images]
[im 55/163  soft-tissue]
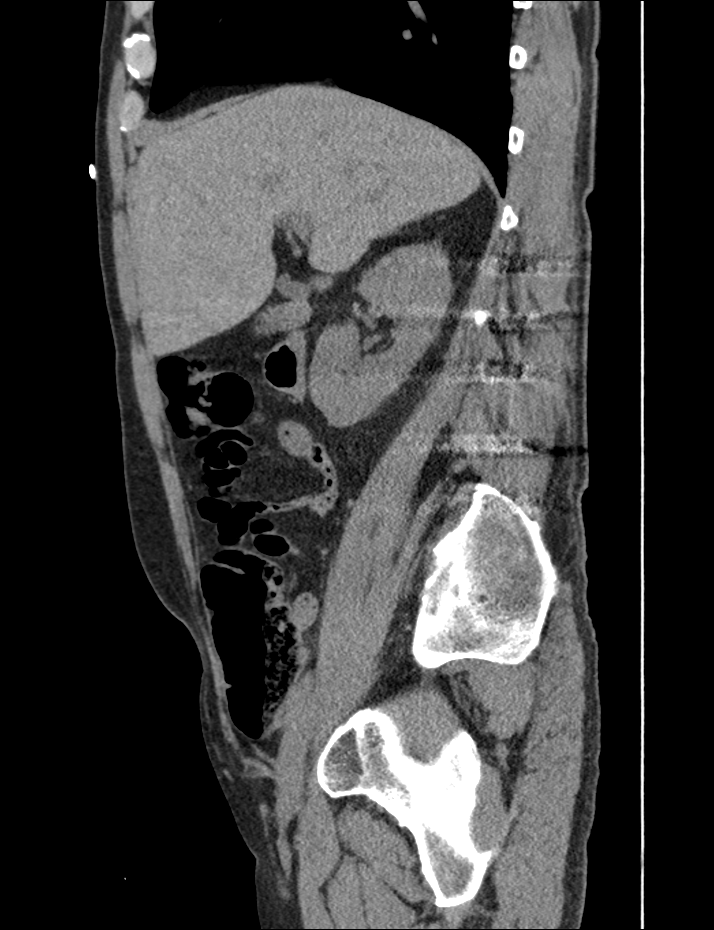
[im 55/163  bone]
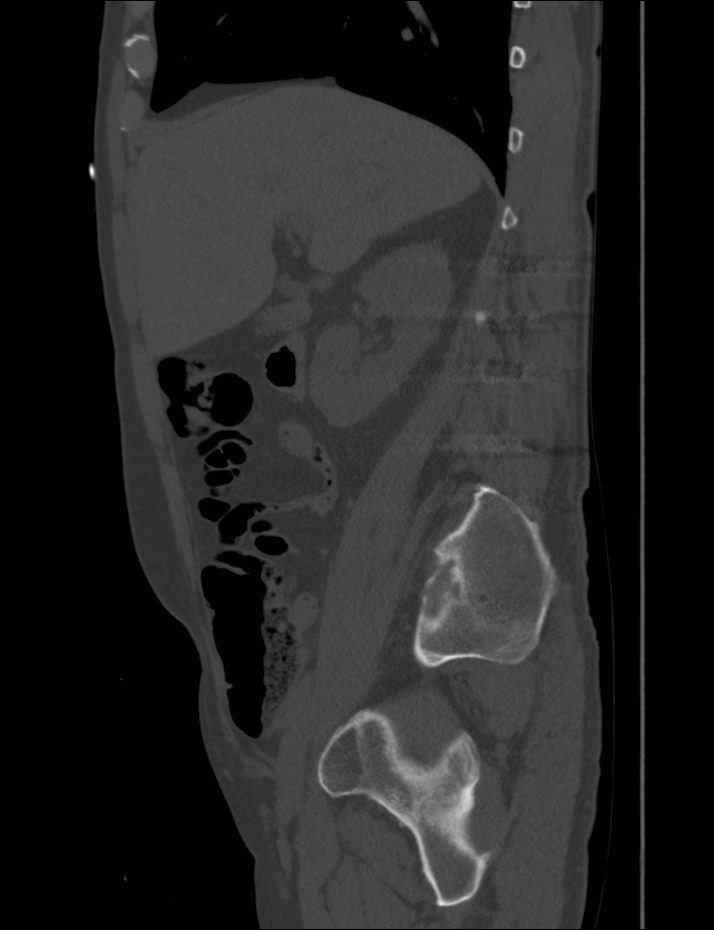

[5 of 46 positions shown; findings below may reference images not displayed]

FINDINGS: Lower chest:  Unremarkable.

Hepatobiliary: No definite cystic or solid hepatic lesions
identified on today's non contrast CT examination. Gallbladder is
normal in appearance.

Pancreas: Unremarkable.

Spleen: Unremarkable.

Adrenals/Urinary Tract: There are no abnormal calcifications within
the collecting system of either kidney, along the course of either
ureter, or within the lumen of the urinary bladder. Mild bilateral
perinephric stranding (nonspecific). No hydroureteronephrosis to
suggest urinary tract obstruction at this time. The unenhanced
appearance of the kidneys is unremarkable bilaterally. Normal
adrenal glands.

Stomach/Bowel: The unenhanced appearance of the stomach is normal.
No pathologic dilatation of small bowel or colon. Numerous colonic
diverticulae are noted, without surrounding inflammatory changes to
suggest an acute diverticulitis at this time. Normal appendix.

Vascular/Lymphatic: Atherosclerosis throughout the abdominal and
pelvic vasculature, without evidence of aneurysm. No lymphadenopathy
noted in the abdomen or pelvis on today's non contrast CT
examination.

Reproductive: Prostate gland and seminal vesicles are unremarkable
in appearance.

Other: No significant volume of ascites.  No pneumoperitoneum.

Musculoskeletal: Postoperative changes of PLIF from L1-L5. There are
no aggressive appearing lytic or blastic lesions noted in the
visualized portions of the skeleton.
IMPRESSION: 1. No acute findings in the abdomen or pelvis to account for the
patient's symptoms. Specifically, no urinary tract calculi or
findings of urinary tract obstruction.
2. Normal appendix.
3. Postoperative changes of PLIF at L1-L5.
4. Atherosclerosis.

## 2015-03-26 ENCOUNTER — Encounter: Payer: Managed Care, Other (non HMO) | Admitting: Internal Medicine

## 2015-03-27 ENCOUNTER — Other Ambulatory Visit: Payer: Self-pay | Admitting: Internal Medicine

## 2015-03-31 ENCOUNTER — Encounter: Payer: Self-pay | Admitting: Internal Medicine

## 2015-03-31 ENCOUNTER — Other Ambulatory Visit: Payer: Self-pay | Admitting: *Deleted

## 2015-03-31 ENCOUNTER — Encounter: Payer: Self-pay | Admitting: *Deleted

## 2015-03-31 ENCOUNTER — Ambulatory Visit (INDEPENDENT_AMBULATORY_CARE_PROVIDER_SITE_OTHER): Payer: Managed Care, Other (non HMO) | Admitting: Internal Medicine

## 2015-03-31 VITALS — BP 118/80 | HR 65 | Temp 98.1°F | Resp 18 | Ht 67.0 in | Wt 160.0 lb

## 2015-03-31 DIAGNOSIS — Z0000 Encounter for general adult medical examination without abnormal findings: Secondary | ICD-10-CM

## 2015-03-31 DIAGNOSIS — I10 Essential (primary) hypertension: Secondary | ICD-10-CM

## 2015-03-31 DIAGNOSIS — Z720 Tobacco use: Secondary | ICD-10-CM

## 2015-03-31 MED ORDER — ALPRAZOLAM 0.5 MG PO TABS: 0.5000 mg | ORAL_TABLET | Freq: Three times a day (TID) | ORAL | Status: DC

## 2015-03-31 MED ORDER — SIMVASTATIN 40 MG PO TABS: 40.0000 mg | ORAL_TABLET | Freq: Every day | ORAL | Status: DC

## 2015-03-31 MED ORDER — SERTRALINE HCL 50 MG PO TABS: 50.0000 mg | ORAL_TABLET | Freq: Every day | ORAL | Status: DC

## 2015-03-31 MED ORDER — LISINOPRIL 20 MG PO TABS: 20.0000 mg | ORAL_TABLET | Freq: Every day | ORAL | Status: DC

## 2015-03-31 MED ORDER — DILTIAZEM HCL ER COATED BEADS 240 MG PO CP24: 240.0000 mg | ORAL_CAPSULE | Freq: Every day | ORAL | Status: DC

## 2015-03-31 NOTE — Patient Instructions (Signed)
Limit your sodium (Salt) intake  Please check your blood pressure on a regular basis.  If it is consistently greater than 150/90, please make an office appointment.    It is important that you exercise regularly, at least 20 minutes 3 to 4 times per week.  If you develop chest pain or shortness of breath seek  medical attention.  Smoking tobacco is very bad for your health. You should stop smoking immediately.  Health Maintenance A healthy lifestyle and preventative care can promote health and wellness.  Maintain regular health, dental, and eye exams.  Eat a healthy diet. Foods like vegetables, fruits, whole grains, low-fat dairy products, and lean protein foods contain the nutrients you need and are low in calories. Decrease your intake of foods high in solid fats, added sugars, and salt. Get information about a proper diet from your health care provider, if necessary.  Regular physical exercise is one of the most important things you can do for your health. Most adults should get at least 150 minutes of moderate-intensity exercise (any activity that increases your heart rate and causes you to sweat) each week. In addition, most adults need muscle-strengthening exercises on 2 or more days a week.   Maintain a healthy weight. The body mass index (BMI) is a screening tool to identify possible weight problems. It provides an estimate of body fat based on height and weight. Your health care provider can find your BMI and can help you achieve or maintain a healthy weight. For males 20 years and older:  A BMI below 18.5 is considered underweight.  A BMI of 18.5 to 24.9 is normal.  A BMI of 25 to 29.9 is considered overweight.  A BMI of 30 and above is considered obese.  Maintain normal blood lipids and cholesterol by exercising and minimizing your intake of saturated fat. Eat a balanced diet with plenty of fruits and vegetables. Blood tests for lipids and cholesterol should begin at age 62 and  be repeated every 5 years. If your lipid or cholesterol levels are high, you are over age 37, or you are at high risk for heart disease, you may need your cholesterol levels checked more frequently.Ongoing high lipid and cholesterol levels should be treated with medicines if diet and exercise are not working.  If you smoke, find out from your health care provider how to quit. If you do not use tobacco, do not start.  Lung cancer screening is recommended for adults aged 58-80 years who are at high risk for developing lung cancer because of a history of smoking. A yearly low-dose CT scan of the lungs is recommended for people who have at least a 30-pack-year history of smoking and are current smokers or have quit within the past 15 years. A pack year of smoking is smoking an average of 1 pack of cigarettes a day for 1 year (for example, a 30-pack-year history of smoking could mean smoking 1 pack a day for 30 years or 2 packs a day for 15 years). Yearly screening should continue until the smoker has stopped smoking for at least 15 years. Yearly screening should be stopped for people who develop a health problem that would prevent them from having lung cancer treatment.  If you choose to drink alcohol, do not have more than 2 drinks per day. One drink is considered to be 12 oz (360 mL) of beer, 5 oz (150 mL) of wine, or 1.5 oz (45 mL) of liquor.  Avoid the use  of street drugs. Do not share needles with anyone. Ask for help if you need support or instructions about stopping the use of drugs.  High blood pressure causes heart disease and increases the risk of stroke. Blood pressure should be checked at least every 1-2 years. Ongoing high blood pressure should be treated with medicines if weight loss and exercise are not effective.  If you are 18-81 years old, ask your health care provider if you should take aspirin to prevent heart disease.  Diabetes screening involves taking a blood sample to check your  fasting blood sugar level. This should be done once every 3 years after age 105 if you are at a normal weight and without risk factors for diabetes. Testing should be considered at a younger age or be carried out more frequently if you are overweight and have at least 1 risk factor for diabetes.  Colorectal cancer can be detected and often prevented. Most routine colorectal cancer screening begins at the age of 30 and continues through age 7. However, your health care provider may recommend screening at an earlier age if you have risk factors for colon cancer. On a yearly basis, your health care provider may provide home test kits to check for hidden blood in the stool. A small camera at the end of a tube may be used to directly examine the colon (sigmoidoscopy or colonoscopy) to detect the earliest forms of colorectal cancer. Talk to your health care provider about this at age 62 when routine screening begins. A direct exam of the colon should be repeated every 5-10 years through age 18, unless early forms of precancerous polyps or small growths are found.  People who are at an increased risk for hepatitis B should be screened for this virus. You are considered at high risk for hepatitis B if:  You were born in a country where hepatitis B occurs often. Talk with your health care provider about which countries are considered high risk.  Your parents were born in a high-risk country and you have not received a shot to protect against hepatitis B (hepatitis B vaccine).  You have HIV or AIDS.  You use needles to inject street drugs.  You live with, or have sex with, someone who has hepatitis B.  You are a man who has sex with other men (MSM).  You get hemodialysis treatment.  You take certain medicines for conditions like cancer, organ transplantation, and autoimmune conditions.  Hepatitis C blood testing is recommended for all people born from [birth year] through 1963 and any individual with known risk  factors for hepatitis C.  Healthy men should no longer receive prostate-specific antigen (PSA) blood tests as part of routine cancer screening. Talk to your health care provider about prostate cancer screening.  Testicular cancer screening is not recommended for adolescents or adult males who have no symptoms. Screening includes self-exam, a health care provider exam, and other screening tests. Consult with your health care provider about any symptoms you have or any concerns you have about testicular cancer.  Practice safe sex. Use condoms and avoid high-risk sexual practices to reduce the spread of sexually transmitted infections (STIs).  You should be screened for STIs, including gonorrhea and chlamydia if:  You are sexually active and are younger than 24 years.  You are older than 24 years, and your health care provider tells you that you are at risk for this type of infection.  Your sexual activity has changed since you were last screened,  and you are at an increased risk for chlamydia or gonorrhea. Ask your health care provider if you are at risk.  If you are at risk of being infected with HIV, it is recommended that you take a prescription medicine daily to prevent HIV infection. This is called pre-exposure prophylaxis (PrEP). You are considered at risk if:  You are a man who has sex with other men (MSM).  You are a heterosexual man who is sexually active with multiple partners.  You take drugs by injection.  You are sexually active with a partner who has HIV.  Talk with your health care provider about whether you are at high risk of being infected with HIV. If you choose to begin PrEP, you should first be tested for HIV. You should then be tested every 3 months for as long as you are taking PrEP.  Use sunscreen. Apply sunscreen liberally and repeatedly throughout the day. You should seek shade when your shadow is shorter than you. Protect yourself by wearing long sleeves, pants, a  wide-brimmed hat, and sunglasses year round whenever you are outdoors.  Tell your health care provider of new moles or changes in moles, especially if there is a change in shape or color. Also, tell your health care provider if a mole is larger than the size of a pencil eraser.  A one-time screening for abdominal aortic aneurysm (AAA) and surgical repair of large AAAs by ultrasound is recommended for men aged 41-75 years who are current or former smokers.  Stay current with your vaccines (immunizations). Document Released: 01/06/2008 Document Revised: 07/15/2013 Document Reviewed: 12/05/2010 [facility] Patient Information 2015 [place], [place]. This information is not intended to replace advice given to you by your health care provider. Make sure you discuss any questions you have with your health care provider.

## 2015-03-31 NOTE — Progress Notes (Signed)
Pre visit review using our clinic review tool, if applicable. No additional management support is needed unless otherwise documented below in the visit note. 

## 2015-03-31 NOTE — Progress Notes (Signed)
Subjective:    Patient ID: Aaron Black, male    DOB: 1955-08-12, 59 y.o.   MRN: [id number]  HPI   59 year old patient who is seen today for a wellness exam.  Medical problems include a history of essential hypertension and ongoing tobacco use. He has chronic low back pain and is status post remote cervical fusion and more recently, lumbar fusion in November 2015.  Still has chronic low back pain and is on morphine sulfate once or twice daily. Disabled since 2005 Colonoscopy 2008  Past Medical History  Diagnosis Date  . Diverticulosis of colon   . GERD (gastroesophageal reflux disease)   . Cervical disc disease     a. 07/2003 ant cervical deomcpression and fusion C5-6/C6-7;  b. 09/2007 post cervical laminectomy C4-5 with scres and arthrodesis C4-C7.  Marland Kitchen Sleep apnea   . Arthritis   . Anxiety   . Hypertension   . Colon polyps   . B12 deficiency   . Anemia   . Hyperlipidemia   . Gout   . Pleurisy   . Tobacco abuse     a. 40 yrs, 1.5-3 ppd over that time.  . Chronic lower back pain   . Complication of anesthesia     " i WOKE UP DURING A COLONOSCOPY "  . Chest pain at rest 02/25/2013  . Depression     Social History   Social History  . Marital Status: Married    Spouse Name: N/A  . Number of Children: N/A  . Years of Education: N/A   Occupational History  . Not on file.   Social History Main Topics  . Smoking status: Heavy Tobacco Smoker -- 0.50 packs/day for 40 years    Types: Cigarettes    Last Attempt to Quit: 02/24/2013  . Smokeless tobacco: Never Used     Comment: Currently smoking 1.5 ppd.  Has previously smoked up to 3 ppd and did so for about 20 yrs.  . Alcohol Use: Yes     Comment: at least 3 beers/night.  . Drug Use: No  . Sexual Activity: Not on file   Other Topics Concern  . Not on file   Social History Narrative   Lives in [place] with wife.  Does not work.  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    Past Surgical History  Procedure  Laterality Date  . Rotator cuff repair  August 2000, January 2002, July 2008, July 2009    2 left 2 right  . Neck surgery  January 2005, March 2009    C-Spine  . Cervical disc surgery      X 2  . Elbow arthroscopy Right   . Carpal tunnel release Right   . Colonoscopy    . Lumbar laminectomy/decompression microdiscectomy Right 07/11/2013    Procedure: LUMBAR LAMINECTOMY/DECOMPRESSION MICRODISCECTOMY 1 LEVEL;  Surgeon: Erline Levine, MD;  Location: [facility];  Service: Neurosurgery;  Laterality: Right;  Right L34 microdiskectomy  . Knee arthroscopy Right 08/2013    torn menicus  . Knee arthroplasty Left 11/2013    chip cartlidge,torn menicus  . Anterior lateral lumbar fusion 4 levels Right 06/05/2014    Procedure: ANTERIOR LATERAL LUMBAR FUSION  LUMBAR ONE TO LUMBAR FIVE with Percutaneous Pedicle Screws;  Surgeon: Erline Levine, MD;  Location: [facility];  Service: Neurosurgery;  Laterality: Right;  . Lumbar percutaneous pedicle screw 4 level N/A 06/05/2014    Procedure: LUMBAR PERCUTANEOUS PEDICLE SCREW 4 LEVEL;  Surgeon: Erline Levine,  MD;  Location: [facility];  Service: Neurosurgery;  Laterality: N/A;    Family History  Problem Relation Age of Onset  . Bone cancer Mother   . Colon polyps Mother   . Squamous cell carcinoma Mother     died @ 37  . Hypertension Father   . Colon polyps Father   . Diabetes Father     borderline  . Congestive Heart Failure Father     alive @ 66  . Sudden death Brother     died @ 13  . Other Brother     died in [place] @ 54 - struck by drunk driver  . Other Brother     died in [place] @ 24 - struck by drunk driver  . Heart attack Mother   . Heart failure Father   . Heart attack Brother   . Stroke Paternal Grandfather     Allergies  Allergen Reactions  . Percodan [Oxycodone-Aspirin] Rash    Percodan caused rash---but he can tolerate Percocet and aspirin on their own     Current Outpatient Prescriptions on File Prior to Visit  Medication Sig  Dispense Refill  . colchicine 0.6 MG tablet Take 0.6 mg by mouth daily as needed (GOUT).     Marland Kitchen diltiazem (CARDIZEM CD) 240 MG 24 hr capsule TAKE ONE CAPSULE BY MOUTH EVERY DAY 90 capsule 0  . lisinopril (PRINIVIL,ZESTRIL) 20 MG tablet TAKE 1 TABLET BY MOUTH EVERY DAY 90 tablet 1  . morphine (MS CONTIN) 15 MG 12 hr tablet Take 1 tablet (15 mg total) by mouth every 8 (eight) hours as needed for pain. 15 tablet 0  . omeprazole (PRILOSEC OTC) 20 MG tablet Take 2 tablets (40 mg total) by mouth daily.    . sertraline (ZOLOFT) 50 MG tablet TAKE 1 TABLET BY MOUTH EVERY DAY 90 tablet 0  . simvastatin (ZOCOR) 20 MG tablet TAKE 1 TABLET BY MOUTH AT BEDTIME 90 tablet 1  . tiZANidine (ZANAFLEX) 4 MG tablet Take 4 mg by mouth every 6 (six) hours as needed for muscle spasms.    . cyanocobalamin (,VITAMIN B-12,) 1000 MCG/ML injection inject 1 milliliter INTO THE MUSCLE EVERY 30 DAYS (Patient not taking: Reported on 03/31/2015) 10 mL 2  . cyclobenzaprine (FLEXERIL) 10 MG tablet Take 10 mg by mouth 2 (two) times daily as needed for muscle spasms.     Marland Kitchen HYDROcodone-acetaminophen (NORCO/VICODIN) 5-325 MG per tablet Take 1 tablet by mouth every 6 (six) hours as needed for moderate pain. (Patient not taking: Reported on 03/31/2015) 70 tablet 0  . Syringe/Needle, Disp, (B-D ECLIPSE SYRINGE) 30G X 1/2" 1 ML MISC every 30 (thirty) days.       No current facility-administered medications on file prior to visit.    BP 118/80 mmHg  Pulse 65  Temp(Src) 98.1 F (36.7 C) (Oral)  Resp 18  Ht 5\' 7"  (1.702 m)  Wt 160 lb (72.576 kg)  BMI 25.05 kg/m2  SpO2 98%     Review of Systems  Constitutional: Negative for fever, chills, activity change, appetite change and fatigue.  HENT: Negative for congestion, dental problem, ear pain, hearing loss, mouth sores, rhinorrhea, sinus pressure, sneezing, tinnitus, trouble swallowing and voice change.   Eyes: Negative for photophobia, pain, redness and visual disturbance.  Respiratory:  Negative for apnea, cough, choking, chest tightness, shortness of breath and wheezing.   Cardiovascular: Negative for chest pain, palpitations and leg swelling.  Gastrointestinal: Negative for nausea, vomiting, abdominal pain, diarrhea, constipation, blood in  stool, abdominal distention, anal bleeding and rectal pain.  Genitourinary: Negative for dysuria, urgency, frequency, hematuria, flank pain, decreased urine volume, discharge, penile swelling, scrotal swelling, difficulty urinating, genital sores and testicular pain.  Musculoskeletal: Positive for back pain. Negative for myalgias, joint swelling, arthralgias, gait problem, neck pain and neck stiffness.  Skin: Negative for color change, rash and wound.  Neurological: Negative for dizziness, tremors, seizures, syncope, facial asymmetry, speech difficulty, weakness, light-headedness, numbness and headaches.  Hematological: Negative for adenopathy. Bruises/bleeds easily.  Psychiatric/Behavioral: Negative for suicidal ideas, hallucinations, behavioral problems, confusion, sleep disturbance, self-injury, dysphoric mood, decreased concentration and agitation. The patient is nervous/anxious.        Objective:   Physical Exam  Constitutional: He appears well-developed and well-nourished.  HENT:  Head: Normocephalic and atraumatic.  Right Ear: External ear normal.  Left Ear: External ear normal.  Nose: Nose normal.  Mouth/Throat: Oropharynx is clear and moist.  Eyes: Conjunctivae and EOM are normal. Pupils are equal, round, and reactive to light. No scleral icterus.  Neck: Normal range of motion. Neck supple. No JVD present. No thyromegaly present.  Cardiovascular: Regular rhythm, normal heart sounds and intact distal pulses.  Exam reveals no gallop and no friction rub.   No murmur heard. Pulmonary/Chest: Effort normal and breath sounds normal. He exhibits no tenderness.  Abdominal: Soft. Bowel sounds are normal. He exhibits no distension and no  mass. There is no tenderness.  Genitourinary: Prostate normal and penis normal. Guaiac negative stool.  Musculoskeletal: Normal range of motion. He exhibits no edema or tenderness.  Lymphadenopathy:    He has no cervical adenopathy.  Neurological: He is alert. He has normal reflexes. No cranial nerve deficit. Coordination normal.  Skin: Skin is warm and dry. No rash noted.  Surgical scars posterior neck and lumbar region  Psychiatric: He has a normal mood and affect. His behavior is normal.          Assessment & Plan:  Preventive health exam Hypertension Dyslipidemia.  Will increase the simvastatin to 40 mg daily Tobacco abuse.  Total smoking cessation encouraged  Recheck 6 months

## 2015-09-14 ENCOUNTER — Ambulatory Visit (INDEPENDENT_AMBULATORY_CARE_PROVIDER_SITE_OTHER): Payer: Managed Care, Other (non HMO) | Admitting: Family Medicine

## 2015-09-14 ENCOUNTER — Encounter: Payer: Self-pay | Admitting: Family Medicine

## 2015-09-14 VITALS — BP 92/60 | HR 109 | Temp 98.3°F | Ht 67.0 in | Wt 162.2 lb

## 2015-09-14 DIAGNOSIS — R6889 Other general symptoms and signs: Secondary | ICD-10-CM

## 2015-09-14 LAB — POCT INFLUENZA A/B
Influenza A, POC: POSITIVE — AB
Influenza B, POC: POSITIVE — AB

## 2015-09-14 MED ORDER — BENZONATATE 100 MG PO CAPS: 100.0000 mg | ORAL_CAPSULE | Freq: Three times a day (TID) | ORAL | Status: DC | PRN

## 2015-09-14 MED ORDER — OSELTAMIVIR PHOSPHATE 75 MG PO CAPS: 75.0000 mg | ORAL_CAPSULE | Freq: Two times a day (BID) | ORAL | Status: DC

## 2015-09-14 NOTE — Progress Notes (Signed)
Pre visit review using our clinic review tool, if applicable. No additional management support is needed unless otherwise documented below in the visit note. 

## 2015-09-14 NOTE — Progress Notes (Addendum)
HPI:  Flu like symptoms: -started: 2 days ago -symptoms:nasal congestion, sore throat, cough, fever, body aches, chills, mild diarrhea -denies:SOB, NV -has tried: nothing -sick contacts/travel/risks: wife with the same symptoms  ROS: See pertinent positives and negatives per HPI.  Past Medical History  Diagnosis Date  . Diverticulosis of colon   . GERD (gastroesophageal reflux disease)   . Cervical disc disease     a. 07/2003 ant cervical deomcpression and fusion C5-6/C6-7;  b. 09/2007 post cervical laminectomy C4-5 with scres and arthrodesis C4-C7.  Marland Kitchen Sleep apnea   . Arthritis   . Anxiety   . Hypertension   . Colon polyps   . B12 deficiency   . Anemia   . Hyperlipidemia   . Gout   . Pleurisy   . Tobacco abuse     a. 40 yrs, 1.5-3 ppd over that time.  . Chronic lower back pain   . Complication of anesthesia     " i WOKE UP DURING A COLONOSCOPY "  . Chest pain at rest 02/25/2013  . Depression     Past Surgical History  Procedure Laterality Date  . Rotator cuff repair  August 2000, January 2002, July 2008, July 2009    2 left 2 right  . Neck surgery  January 2005, March 2009    C-Spine  . Cervical disc surgery      X 2  . Elbow arthroscopy Right   . Carpal tunnel release Right   . Colonoscopy    . Lumbar laminectomy/decompression microdiscectomy Right 07/11/2013    Procedure: LUMBAR LAMINECTOMY/DECOMPRESSION MICRODISCECTOMY 1 LEVEL;  Surgeon: Erline Levine, MD;  Location: [facility];  Service: Neurosurgery;  Laterality: Right;  Right L34 microdiskectomy  . Knee arthroscopy Right 08/2013    torn menicus  . Knee arthroplasty Left 11/2013    chip cartlidge,torn menicus  . Anterior lateral lumbar fusion 4 levels Right 06/05/2014    Procedure: ANTERIOR LATERAL LUMBAR FUSION  LUMBAR ONE TO LUMBAR FIVE with Percutaneous Pedicle Screws;  Surgeon: Erline Levine, MD;  Location: [facility];  Service: Neurosurgery;  Laterality: Right;  . Lumbar percutaneous pedicle screw 4  level N/A 06/05/2014    Procedure: LUMBAR PERCUTANEOUS PEDICLE SCREW 4 LEVEL;  Surgeon: Erline Levine, MD;  Location: [facility];  Service: Neurosurgery;  Laterality: N/A;    Family History  Problem Relation Age of Onset  . Bone cancer Mother   . Colon polyps Mother   . Squamous cell carcinoma Mother     died @ 1  . Hypertension Father   . Colon polyps Father   . Diabetes Father     borderline  . Congestive Heart Failure Father     alive @ 29  . Sudden death Brother     died @ 45  . Other Brother     died in [place] @ 95 - struck by drunk driver  . Other Brother     died in [place] @ 45 - struck by drunk driver  . Heart attack Mother   . Heart failure Father   . Heart attack Brother   . Stroke Paternal Grandfather     Social History   Social History  . Marital Status: Married    Spouse Name: N/A  . Number of Children: N/A  . Years of Education: N/A   Social History Main Topics  . Smoking status: Heavy Tobacco Smoker -- 0.50 packs/day for 40 years    Types: Cigarettes    Last  Attempt to Quit: 02/24/2013  . Smokeless tobacco: Never Used     Comment: Currently smoking 1.5 ppd.  Has previously smoked up to 3 ppd and did so for about 20 yrs.  . Alcohol Use: Yes     Comment: at least 3 beers/night.  . Drug Use: No  . Sexual Activity: Not Asked   Other Topics Concern  . None   Social History Narrative   Lives in [place] with wife.  Does not work.  Previously read H2O meters for city of [place].     Current outpatient prescriptions:  .  ALPRAZolam (XANAX) 0.5 MG tablet, Take 1 tablet (0.5 mg total) by mouth 3 (three) times daily., Disp: 90 tablet, Rfl: 5 .  colchicine 0.6 MG tablet, Take 0.6 mg by mouth daily as needed (GOUT). , Disp: , Rfl:  .  cyclobenzaprine (FLEXERIL) 10 MG tablet, Take 10 mg by mouth 2 (two) times daily as needed for muscle spasms. , Disp: , Rfl:  .  diltiazem (CARDIZEM CD) 240 MG 24 hr capsule, Take 1 capsule (240 mg total) by mouth daily., Disp: 90  capsule, Rfl: 3 .  HYDROcodone-acetaminophen (NORCO/VICODIN) 5-325 MG per tablet, Take 1 tablet by mouth every 6 (six) hours as needed for moderate pain., Disp: 70 tablet, Rfl: 0 .  lisinopril (PRINIVIL,ZESTRIL) 20 MG tablet, Take 1 tablet (20 mg total) by mouth daily., Disp: 90 tablet, Rfl: 3 .  omeprazole (PRILOSEC OTC) 20 MG tablet, Take 2 tablets (40 mg total) by mouth daily., Disp: , Rfl:  .  sertraline (ZOLOFT) 50 MG tablet, Take 1 tablet (50 mg total) by mouth daily., Disp: 90 tablet, Rfl: 3 .  simvastatin (ZOCOR) 40 MG tablet, Take 1 tablet (40 mg total) by mouth at bedtime., Disp: 90 tablet, Rfl: 5 .  vitamin B-12 (CYANOCOBALAMIN) 1000 MCG tablet, Take 1,000 mcg by mouth daily., Disp: , Rfl:  .  benzonatate (TESSALON PERLES) 100 MG capsule, Take 1 capsule (100 mg total) by mouth 3 (three) times daily as needed., Disp: 20 capsule, Rfl: 0 .  oseltamivir (TAMIFLU) 75 MG capsule, Take 1 capsule (75 mg total) by mouth 2 (two) times daily., Disp: 14 capsule, Rfl: 0  EXAM:  Filed Vitals:   09/14/15 1406  BP: 92/60  Pulse: 109  Temp: 98.3 F (36.8 C)    Body mass index is 25.4 kg/(m^2).  GENERAL: vitals reviewed and listed above, alert, oriented, appears well hydrated and in no acute distress  HEENT: atraumatic, conjunttiva clear, no obvious abnormalities on inspection of external nose and ears, normal appearance of ear canals and TMs, clear nasal congestion, mild post oropharyngeal erythema with PND, no tonsillar edema or exudate, no sinus TTP  NECK: no obvious masses on inspection  LUNGS: clear to auscultation bilaterally, no wheezes, rales or rhonchi, good air movement  CV: HRRR, no peripheral edema  MS: moves all extremities without noticeable abnormality  PSYCH: pleasant and cooperative, no obvious depression or anxiety  ASSESSMENT AND PLAN:  Discussed the following assessment and plan:  Influenza-like symptoms - Plan: POC Influenza A/B  -rapid flu positive.  tamiflu  risks and benefits, likely course, antibiotic misuse, transmission, and signs of developing a serious illness or secondary bacterial infection discussed.. -his wife has the same, but more severe symptoms and lung disease and is being treated with tamiflu today and he wants to start tamiflu as well despite milder symptoms.   -of course, we advised to return or notify a doctor immediately if symptoms worsen or  persist or new concerns arise.    Patient Instructions  Influenza, Adult Influenza ("the flu") is a viral infection of the respiratory tract. It occurs more often in winter months because people spend more time in close contact with one another. Influenza can make you feel very sick. Influenza easily spreads from person to person (contagious). CAUSES  Influenza is caused by a virus that infects the respiratory tract. You can catch the virus by breathing in droplets from an infected person's cough or sneeze. You can also catch the virus by touching something that was recently contaminated with the virus and then touching your mouth, nose, or eyes. RISKS AND COMPLICATIONS You may be at risk for a more severe case of influenza if you smoke cigarettes, have diabetes, have chronic heart disease (such as heart failure) or lung disease (such as asthma), or if you have a weakened immune system. Elderly people and pregnant women are also at risk for more serious infections. The most common problem of influenza is a lung infection (pneumonia). Sometimes, this problem can require emergency medical care and may be life threatening. SIGNS AND SYMPTOMS  Symptoms typically last 4 to 10 days and may include:  Fever.  Chills.  Headache, body aches, and muscle aches.  Sore throat.  Chest discomfort and cough.  Poor appetite.  Weakness or feeling tired.  Dizziness.  Nausea or vomiting. DIAGNOSIS  Diagnosis of influenza is often made based on your history and a physical exam. A nose or throat swab  test can be done to confirm the diagnosis. TREATMENT  In mild cases, influenza goes away on its own. Treatment is directed at relieving symptoms. For more severe cases, your health care provider may prescribe antiviral medicines to shorten the sickness. Antibiotic medicines are not effective because the infection is caused by a virus, not by bacteria. HOME CARE INSTRUCTIONS  Take medicines only as directed by your health care provider.  Use a cool mist humidifier to make breathing easier.  Get plenty of rest until your temperature returns to normal. This usually takes 3 to 4 days.  Drink enough fluid to keep your urine clear or pale yellow.  Cover yourmouth and nosewhen coughing or sneezing,and wash your handswellto prevent thevirusfrom spreading.  Stay homefromwork orschool untilthe fever is gonefor at least 26full day. PREVENTION  An annual influenza vaccination (flu shot) is the best way to avoid getting influenza. An annual flu shot is now routinely recommended for all adults in the [place] IF:  You experiencechest pain, yourcough worsens,or you producemore mucus.  Youhave nausea,vomiting, ordiarrhea.  Your fever returns or gets worse. SEEK IMMEDIATE MEDICAL CARE IF:  You havetrouble breathing, you become short of breath,or your skin ornails becomebluish.  You have severe painor stiffnessin the neck.  You develop a sudden headache, or pain in the face or ear.  You have nausea or vomiting that you cannot control. MAKE SURE YOU:   Understand these instructions.  Will watch your condition.  Will get help right away if you are not doing well or get worse.   This information is not intended to replace advice given to you by your health care provider. Make sure you discuss any questions you have with your health care provider.   Document Released: 07/07/2000 Document Revised: 07/31/2014 Document Reviewed: 10/09/2011 Elsevier  Interactive Patient Education 2016 [employer], [place]

## 2015-09-14 NOTE — Addendum Note (Signed)
Addended by: Agnes Lawrence on: 09/14/2015 02:44 PM   Modules accepted: Orders

## 2015-09-14 NOTE — Patient Instructions (Signed)

## 2015-09-14 NOTE — Addendum Note (Signed)
Addended by: Lucretia Kern on: 09/14/2015 02:47 PM   Modules accepted: Orders

## 2015-09-30 ENCOUNTER — Telehealth: Payer: Self-pay | Admitting: Family Medicine

## 2015-09-30 NOTE — Telephone Encounter (Signed)
[facility]  Patient Name: Aaron Black  DOB: 02-12-1956    Initial Comment Caller states yesterday my husband is having 58/56 and 99/43 106/60 105/56 this morning 116/72 then 138/83 now it is 106/59    Nurse Assessment  Nurse: Justine Null, RN, Rodena Piety Date/Time (Eastern Time): 09/30/2015 3:43:09 PM  Confirm and document reason for call. If symptomatic, describe symptoms. You must click the next button to save text entered. ---Caller states yesterday my husband is having 48/56 and 99/43 106/60 105/56 this morning 116/72 then 138/83 now it is 106/59 Caller stated that he has been having the 2 BP pills and he takes the meds of Dilziem ER 240 mg po daily and lisinopril 20 mg po daily and ahs been taking in the AM and has been having some dizziness  Has the patient traveled out of the country within the last 30 days? ---No  Does the patient have any new or worsening symptoms? ---Yes  Will a triage be completed? ---Yes  Related visit to physician within the last 2 weeks? ---No  Does the PT have any chronic conditions? (i.e. diabetes, asthma, etc.) ---Yes  List chronic conditions. ---HTN  Is this a behavioral health or substance abuse call? ---No     Guidelines    Guideline Title Affirmed Question Affirmed Notes  Low Blood Pressure [id number] Systolic BP [id number] AND [id number] taking blood pressure medications AND [3] NOT dizzy, lightheaded or weak    Final Disposition User   See Physician within [place], RN, Rodena Piety    Comments  call placed to the office back line at [phone] and spoke with Danae Chen and informed of the patient status and the need for an appointment within the next 24 hours and warm transferred to the caller   Referrals  REFERRED TO PCP OFFICE   Disagree/Comply: Comply

## 2015-09-30 NOTE — Telephone Encounter (Signed)
FYI, appt tomorrow with Dr.Kim.

## 2015-10-01 ENCOUNTER — Ambulatory Visit (INDEPENDENT_AMBULATORY_CARE_PROVIDER_SITE_OTHER): Payer: Managed Care, Other (non HMO) | Admitting: Family Medicine

## 2015-10-01 ENCOUNTER — Encounter: Payer: Self-pay | Admitting: Family Medicine

## 2015-10-01 VITALS — BP 118/82 | HR 84 | Temp 98.5°F | Ht 67.0 in | Wt 162.4 lb

## 2015-10-01 DIAGNOSIS — I10 Essential (primary) hypertension: Secondary | ICD-10-CM | POA: Diagnosis not present

## 2015-10-01 NOTE — Progress Notes (Signed)
HPI:  Aaron Black is a pleasant 60 yo here for an acute visit regarding his blood pressure. He had the flu last week and his wife has been monitoring his blood pressure 5-6 times per day with readings in the 90-120s/50-80s. Se was worried about some of the lower readings. He recently stopped NSAIDs. He feels fine and reports he is over the flu other then a mild cough. He denies CP, SOB, fevers, DOE, dizziness, HA. His blood pressure has been higher the last day.   ROS: See pertinent positives and negatives per HPI.  Past Medical History  Diagnosis Date  . Diverticulosis of colon   . GERD (gastroesophageal reflux disease)   . Cervical disc disease     a. 07/2003 ant cervical deomcpression and fusion C5-6/C6-7;  b. 09/2007 post cervical laminectomy C4-5 with scres and arthrodesis C4-C7.  Marland Kitchen Sleep apnea   . Arthritis   . Anxiety   . Hypertension   . Colon polyps   . B12 deficiency   . Anemia   . Hyperlipidemia   . Gout   . Pleurisy   . Tobacco abuse     a. 40 yrs, 1.5-3 ppd over that time.  . Chronic lower back pain   . Complication of anesthesia     " i WOKE UP DURING A COLONOSCOPY "  . Chest pain at rest 02/25/2013  . Depression     Past Surgical History  Procedure Laterality Date  . Rotator cuff repair  August 2000, January 2002, July 2008, July 2009    2 left 2 right  . Neck surgery  January 2005, March 2009    C-Spine  . Cervical disc surgery      X 2  . Elbow arthroscopy Right   . Carpal tunnel release Right   . Colonoscopy    . Lumbar laminectomy/decompression microdiscectomy Right 07/11/2013    Procedure: LUMBAR LAMINECTOMY/DECOMPRESSION MICRODISCECTOMY 1 LEVEL;  Surgeon: Erline Levine, MD;  Location: [facility];  Service: Neurosurgery;  Laterality: Right;  Right L34 microdiskectomy  . Knee arthroscopy Right 08/2013    torn menicus  . Knee arthroplasty Left 11/2013    chip cartlidge,torn menicus  . Anterior lateral lumbar fusion 4 levels Right 06/05/2014   Procedure: ANTERIOR LATERAL LUMBAR FUSION  LUMBAR ONE TO LUMBAR FIVE with Percutaneous Pedicle Screws;  Surgeon: Erline Levine, MD;  Location: [facility];  Service: Neurosurgery;  Laterality: Right;  . Lumbar percutaneous pedicle screw 4 level N/A 06/05/2014    Procedure: LUMBAR PERCUTANEOUS PEDICLE SCREW 4 LEVEL;  Surgeon: Erline Levine, MD;  Location: [facility];  Service: Neurosurgery;  Laterality: N/A;    Family History  Problem Relation Age of Onset  . Bone cancer Mother   . Colon polyps Mother   . Squamous cell carcinoma Mother     died @ 34  . Hypertension Father   . Colon polyps Father   . Diabetes Father     borderline  . Congestive Heart Failure Father     alive @ 27  . Sudden death Brother     died @ 58  . Other Brother     died in [place] @ 81 - struck by drunk driver  . Other Brother     died in M[place] @ 55 - struck by drunk driver  . Heart attack Mother   . Heart failure Father   . Heart attack Brother   . Stroke Paternal Grandfather     Social History  Social History  . Marital Status: Married    Spouse Name: N/A  . Number of Children: N/A  . Years of Education: N/A   Social History Main Topics  . Smoking status: Heavy Tobacco Smoker -- 0.50 packs/day for 40 years    Types: Cigarettes    Last Attempt to Quit: 02/24/2013  . Smokeless tobacco: Never Used     Comment: Currently smoking 1.5 ppd.  Has previously smoked up to 3 ppd and did so for about 20 yrs.  . Alcohol Use: Yes     Comment: at least 3 beers/night.  . Drug Use: No  . Sexual Activity: Not Asked   Other Topics Concern  . None   Social History Narrative   Lives in [place] with wife.  Does not work.  Previously read H2O meters for city of [place].     Current outpatient prescriptions:  .  ALPRAZolam (XANAX) 0.5 MG tablet, Take 1 tablet (0.5 mg total) by mouth 3 (three) times daily., Disp: 90 tablet, Rfl: 5 .  colchicine 0.6 MG tablet, Take 0.6 mg by mouth daily as needed (GOUT). , Disp: , Rfl:  .   cyclobenzaprine (FLEXERIL) 10 MG tablet, Take 10 mg by mouth 2 (two) times daily as needed for muscle spasms. , Disp: , Rfl:  .  diltiazem (CARDIZEM CD) 240 MG 24 hr capsule, Take 1 capsule (240 mg total) by mouth daily., Disp: 90 capsule, Rfl: 3 .  HYDROcodone-acetaminophen (NORCO/VICODIN) 5-325 MG per tablet, Take 1 tablet by mouth every 6 (six) hours as needed for moderate pain., Disp: 70 tablet, Rfl: 0 .  lisinopril (PRINIVIL,ZESTRIL) 20 MG tablet, Take 1 tablet (20 mg total) by mouth daily., Disp: 90 tablet, Rfl: 3 .  omeprazole (PRILOSEC OTC) 20 MG tablet, Take 2 tablets (40 mg total) by mouth daily., Disp: , Rfl:  .  sertraline (ZOLOFT) 50 MG tablet, Take 1 tablet (50 mg total) by mouth daily., Disp: 90 tablet, Rfl: 3 .  simvastatin (ZOCOR) 40 MG tablet, Take 1 tablet (40 mg total) by mouth at bedtime., Disp: 90 tablet, Rfl: 5 .  vitamin B-12 (CYANOCOBALAMIN) 1000 MCG tablet, Take 1,000 mcg by mouth daily., Disp: , Rfl:   EXAM:  Filed Vitals:   10/01/15 1348  BP: 118/82  Pulse: 84  Temp: 98.5 F (36.9 C)    Body mass index is 25.43 kg/(m^2).  GENERAL: vitals reviewed and listed above, alert, oriented, appears well hydrated and in no acute distress  HEENT: atraumatic, conjunttiva clear, no obvious abnormalities on inspection of external nose and ears  NECK: no obvious masses on inspection  LUNGS: clear to auscultation bilaterally, no wheezes, rales or rhonchi, good air movement  CV: HRRR, no peripheral edema  MS: moves all extremities without noticeable abnormality  PSYCH: pleasant and cooperative, no obvious depression or anxiety  ASSESSMENT AND PLAN:  Discussed the following assessment and plan:  Essential hypertension  -BP is ok today, mildly high on bottom -advised to monitor now that he is recovered from flu and if concerned remaining low to follow up with PCP with cuff to ensure cuff accurate -if truly low or symptomatic advise would need to decrease one of his  antihypertensives, he agreed -Patient advised to return or notify a doctor immediately if symptoms worsen or persist or new concerns arise.  Patient Instructions  Monitor blood pressure 3 times per week.   If running lower consistently or having symptoms please follow up with your doctor and bring your cuff to  the visit.      Colin Benton R.

## 2015-10-01 NOTE — Progress Notes (Signed)
Pre visit review using our clinic review tool, if applicable. No additional management support is needed unless otherwise documented below in the visit note. 

## 2015-10-01 NOTE — Patient Instructions (Signed)
Monitor blood pressure 3 times per week.   If running lower consistently or having symptoms please follow up with your doctor and bring your cuff to the visit.

## 2015-10-20 ENCOUNTER — Other Ambulatory Visit: Payer: Self-pay | Admitting: Internal Medicine

## 2015-11-26 ENCOUNTER — Encounter: Payer: Self-pay | Admitting: Internal Medicine

## 2015-11-26 ENCOUNTER — Ambulatory Visit (INDEPENDENT_AMBULATORY_CARE_PROVIDER_SITE_OTHER): Payer: Managed Care, Other (non HMO) | Admitting: Internal Medicine

## 2015-11-26 VITALS — BP 160/100 | HR 103 | Temp 98.5°F | Resp 20 | Ht 67.0 in | Wt 158.0 lb

## 2015-11-26 DIAGNOSIS — E785 Hyperlipidemia, unspecified: Secondary | ICD-10-CM

## 2015-11-26 DIAGNOSIS — I10 Essential (primary) hypertension: Secondary | ICD-10-CM | POA: Diagnosis not present

## 2015-11-26 DIAGNOSIS — Z720 Tobacco use: Secondary | ICD-10-CM

## 2015-11-26 DIAGNOSIS — M1000 Idiopathic gout, unspecified site: Secondary | ICD-10-CM | POA: Diagnosis not present

## 2015-11-26 MED ORDER — LOSARTAN POTASSIUM 100 MG PO TABS: 100.0000 mg | ORAL_TABLET | Freq: Every day | ORAL | Status: DC

## 2015-11-26 MED ORDER — ALLOPURINOL 100 MG PO TABS: 100.0000 mg | ORAL_TABLET | Freq: Every day | ORAL | Status: DC

## 2015-11-26 NOTE — Progress Notes (Signed)
Pre visit review using our clinic review tool, if applicable. No additional management support is needed unless otherwise documented below in the visit note. 

## 2015-11-26 NOTE — Patient Instructions (Addendum)
Limit your sodium (Salt) intake  Please check your blood pressure on a regular basis.  If it is consistently greater than 150/90, please make an office appointment.  Return in 4 weeks for follow-up  Discontinue losartan.  If you have any recurrent lip swelling

## 2015-11-26 NOTE — Progress Notes (Signed)
Subjective:    Patient ID: Aaron Black, male    DOB: [birth year], 60 y.o.   MRN: [id number]  HPI  60 year old patient who is seen today for follow-up of hypertension.  He was seen at an urgent care recently with swelling involving the left lip and lisinopril was discontinued.  He remains on diltiazem.  He is quite anxious today following a storm through the night and has not taken his blood pressure medications yet He is followed by rheumatology and noted have a uric acid level recently of 9.9.  His last attack of gout was a number of months ago.  He is completing doxycycline for a URI.  He has had a couple of recent tick exposures.  He has chronic low back pain and is also followed by neurology.  Past Medical History  Diagnosis Date  . Diverticulosis of colon   . GERD (gastroesophageal reflux disease)   . Cervical disc disease     a. 07/2003 ant cervical deomcpression and fusion C5-6/C6-7;  b. 09/2007 post cervical laminectomy C4-5 with scres and arthrodesis C4-C7.  Marland Kitchen Sleep apnea   . Arthritis   . Anxiety   . Hypertension   . Colon polyps   . B12 deficiency   . Anemia   . Hyperlipidemia   . Gout   . Pleurisy   . Tobacco abuse     a. 40 yrs, 1.5-3 ppd over that time.  . Chronic lower back pain   . Complication of anesthesia     " i WOKE UP DURING A COLONOSCOPY "  . Chest pain at rest 02/25/2013  . Depression      Social History   Social History  . Marital Status: Married    Spouse Name: N/A  . Number of Children: N/A  . Years of Education: N/A   Occupational History  . Not on file.   Social History Main Topics  . Smoking status: Heavy Tobacco Smoker -- 0.50 packs/day for 40 years    Types: Cigarettes    Last Attempt to Quit: 02/24/2013  . Smokeless tobacco: Never Used     Comment: Currently smoking 1.5 ppd.  Has previously smoked up to 3 ppd and did so for about 20 yrs.  . Alcohol Use: Yes     Comment: at least 3 beers/night.  . Drug Use: No  . Sexual Activity:  Not on file   Other Topics Concern  . Not on file   Social History Narrative   Lives in [place] with wife.  Does not work.  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    Past Surgical History  Procedure Laterality Date  . Rotator cuff repair  August 2000, January 2002, July 2008, July 2009    2 left 2 right  . Neck surgery  January 2005, March 2009    C-Spine  . Cervical disc surgery      X 2  . Elbow arthroscopy Right   . Carpal tunnel release Right   . Colonoscopy    . Lumbar laminectomy/decompression microdiscectomy Right 07/11/2013    Procedure: LUMBAR LAMINECTOMY/DECOMPRESSION MICRODISCECTOMY 1 LEVEL;  Surgeon: Erline Levine, MD;  Location: [facility];  Service: Neurosurgery;  Laterality: Right;  Right L34 microdiskectomy  . Knee arthroscopy Right 08/2013    torn menicus  . Knee arthroplasty Left 11/2013    chip cartlidge,torn menicus  . Anterior lateral lumbar fusion 4 levels Right 06/05/2014    Procedure: ANTERIOR LATERAL LUMBAR FUSION  LUMBAR  ONE TO LUMBAR FIVE with Percutaneous Pedicle Screws;  Surgeon: Erline Levine, MD;  Location: [facility];  Service: Neurosurgery;  Laterality: Right;  . Lumbar percutaneous pedicle screw 4 level N/A 06/05/2014    Procedure: LUMBAR PERCUTANEOUS PEDICLE SCREW 4 LEVEL;  Surgeon: Erline Levine, MD;  Location: [facility];  Service: Neurosurgery;  Laterality: N/A;    Family History  Problem Relation Age of Onset  . Bone cancer Mother   . Colon polyps Mother   . Squamous cell carcinoma Mother     died @ 34  . Hypertension Father   . Colon polyps Father   . Diabetes Father     borderline  . Congestive Heart Failure Father     alive @ 38  . Sudden death Brother     died @ 7  . Other Brother     died in [place] @ 41 - struck by drunk driver  . Other Brother     died in [place] @ 58 - struck by drunk driver  . Heart attack Mother   . Heart failure Father   . Heart attack Brother   . Stroke Paternal Grandfather     Allergies  Allergen  Reactions  . Lisinopril Swelling    Swollen Lip  . Percodan [Oxycodone-Aspirin] Rash    Percodan caused rash---but he can tolerate Percocet and aspirin on their own     Current Outpatient Prescriptions on File Prior to Visit  Medication Sig Dispense Refill  . ALPRAZolam (XANAX) 0.5 MG tablet TAKE 1 TABLET BY MOUTH 3 TIMES A DAY 90 tablet 2  . colchicine 0.6 MG tablet Take 0.6 mg by mouth daily as needed (GOUT).     . cyclobenzaprine (FLEXERIL) 10 MG tablet Take 10 mg by mouth 2 (two) times daily as needed for muscle spasms.     Marland Kitchen diltiazem (CARDIZEM CD) 240 MG 24 hr capsule Take 1 capsule (240 mg total) by mouth daily. 90 capsule 3  . HYDROcodone-acetaminophen (NORCO/VICODIN) 5-325 MG per tablet Take 1 tablet by mouth every 6 (six) hours as needed for moderate pain. 70 tablet 0  . omeprazole (PRILOSEC OTC) 20 MG tablet Take 2 tablets (40 mg total) by mouth daily.    . sertraline (ZOLOFT) 50 MG tablet Take 1 tablet (50 mg total) by mouth daily. 90 tablet 3  . simvastatin (ZOCOR) 40 MG tablet Take 1 tablet (40 mg total) by mouth at bedtime. 90 tablet 5  . vitamin B-12 (CYANOCOBALAMIN) 1000 MCG tablet Take 1,000 mcg by mouth daily.     No current facility-administered medications on file prior to visit.    BP 160/100 mmHg  Pulse 103  Temp(Src) 98.5 F (36.9 C) (Oral)  Resp 20  Ht 5\' 7"  (1.702 m)  Wt 158 lb (71.668 kg)  BMI 24.74 kg/m2  SpO2 99%     Review of Systems  Constitutional: Negative for fever, chills, appetite change and fatigue.  HENT: Negative for congestion, dental problem, ear pain, hearing loss, sore throat, tinnitus, trouble swallowing and voice change.   Eyes: Negative for pain, discharge and visual disturbance.  Respiratory: Negative for cough, chest tightness, wheezing and stridor.   Cardiovascular: Negative for chest pain, palpitations and leg swelling.  Gastrointestinal: Negative for nausea, vomiting, abdominal pain, diarrhea, constipation, blood in stool and  abdominal distention.  Genitourinary: Negative for urgency, hematuria, flank pain, discharge, difficulty urinating and genital sores.  Musculoskeletal: Positive for back pain. Negative for myalgias, joint swelling, arthralgias, gait problem and neck  stiffness.  Skin: Negative for rash.  Neurological: Negative for dizziness, syncope, speech difficulty, weakness, numbness and headaches.  Hematological: Negative for adenopathy. Does not bruise/bleed easily.  Psychiatric/Behavioral: Negative for behavioral problems and dysphoric mood. The patient is nervous/anxious.        Objective:   Physical Exam  Constitutional: He is oriented to person, place, and time. He appears well-developed.  Blood pressure 160/100.  Pulse 100  HENT:  Head: Normocephalic.  Right Ear: External ear normal.  Left Ear: External ear normal.  Eyes: Conjunctivae and EOM are normal.  Neck: Normal range of motion.  Cardiovascular: Normal rate and normal heart sounds.   Pulmonary/Chest: Breath sounds normal. No respiratory distress. He has no wheezes. He has no rales.  Abdominal: Bowel sounds are normal.  Musculoskeletal: Normal range of motion. He exhibits no edema or tenderness.  Neurological: He is alert and oriented to person, place, and time.  Psychiatric: He has a normal mood and affect. His behavior is normal.          Assessment & Plan:   Hypertension, poor control.  Possible angioedema secondary to ACE inhibiton; will give a trial of losartan 100 mg daily for its uricosuric effect and blood pressure control.  Hopeful.  Patient will tolerate well Hyperuricemia.  According to the patient.  Rheumatology felt primary care should start allopurinol Anxiety disorder Chronic low back pain Ongoing tobacco use.  Chest x-ray report revealed revealed some mild lingular scarring  Home blood pressure monitoring.  Encouraged Recheck 4 weeks

## 2016-01-13 ENCOUNTER — Other Ambulatory Visit: Payer: Self-pay | Admitting: Neurosurgery

## 2016-01-13 ENCOUNTER — Other Ambulatory Visit: Payer: Self-pay | Admitting: *Deleted

## 2016-01-13 ENCOUNTER — Ambulatory Visit
Admission: RE | Admit: 2016-01-13 | Discharge: 2016-01-13 | Disposition: A | Discharge status: Home / Self Care | Payer: Managed Care, Other (non HMO) | Source: Ambulatory Visit | Attending: Neurosurgery | Admitting: Neurosurgery

## 2016-01-13 DIAGNOSIS — M5416 Radiculopathy, lumbar region: Secondary | ICD-10-CM

## 2016-01-13 MED ORDER — ALLOPURINOL 100 MG PO TABS: 100.0000 mg | ORAL_TABLET | Freq: Every day | ORAL | Status: DC

## 2016-01-13 NOTE — Telephone Encounter (Signed)
[facility] sent fax request for 90 day supply.

## 2016-01-17 ENCOUNTER — Other Ambulatory Visit: Payer: Self-pay | Admitting: Neurosurgery

## 2016-01-17 DIAGNOSIS — S32009K Unspecified fracture of unspecified lumbar vertebra, subsequent encounter for fracture with nonunion: Secondary | ICD-10-CM | POA: Diagnosis present

## 2016-01-18 NOTE — Pre-Procedure Instructions (Signed)
    Aaron Black  01/18/2016     Your procedure is scheduled on Friday, May 30.  Report to [facility] Admitting at 5:30 A.M.                 Your surgery or procedure is scheduled for 7:30 AM   Call this number if you have problems the morning of surgery:[phone]                Remember:  Do not eat food or drink liquids after midnight.  Take these medicines the morning of surgery with A SIP OF WATER :allopurinol (ZYLOPRIM), diltiazem (CARDIZEM CD), omeprazole (PRILOSEC OTC), sertraline (ZOLOFT).                    May take if needed: Alprazolam, ALLERGY pill, cyclobenzaprine (FLEXERIL), Pain medication.                       Stop taking Aspirin,Aspirin Products, Herbal medications, Vitamins.  Do not take any NSAIDs ie: Ibuprofen,  Advil,Naproxen or any medication containing Aspirin.   Do not wear jewelry, make-up or nail polish.  Do not wear lotions, powders, or perfumes.  .   Men may shave face and neck.  Do not bring valuables to the hospital.  [facility] is not responsible for any belongings or valuables.  Contacts, dentures or bridgework may not be worn into surgery.  Leave your suitcase in the car.  After surgery it may be brought to your room. For patients admitted to the hospital, discharge time will be determined by your treatment team.  Please read over the following fact sheets that you were given: [facility]- Preparing For Surgery and Patient Instructions for Mupirocin Application

## 2016-01-20 ENCOUNTER — Encounter (HOSPITAL_COMMUNITY)
Admission: RE | Admit: 2016-01-20 | Discharge: 2016-01-20 | Disposition: A | Discharge status: Home / Self Care | Payer: Managed Care, Other (non HMO) | Source: Ambulatory Visit | Attending: Neurosurgery | Admitting: Neurosurgery

## 2016-01-20 ENCOUNTER — Encounter (HOSPITAL_COMMUNITY): Payer: Self-pay

## 2016-01-20 ENCOUNTER — Other Ambulatory Visit: Payer: Self-pay | Admitting: Neurosurgery

## 2016-01-20 HISTORY — DX: Reserved for inherently not codable concepts without codable children: IMO0001

## 2016-01-20 LAB — CBC
HEMATOCRIT: 41.9 % (ref 39.0–52.0)
HEMOGLOBIN: 13.7 g/dL (ref 13.0–17.0)
MCH: 30.1 pg (ref 26.0–34.0)
MCHC: 32.7 g/dL (ref 30.0–36.0)
MCV: 92.1 fL (ref 78.0–100.0)
Platelets: 270 10*3/uL (ref 150–400)
RBC: 4.55 MIL/uL (ref 4.22–5.81)
RDW: 13.5 % (ref 11.5–15.5)
WBC: 5.5 10*3/uL (ref 4.0–10.5)

## 2016-01-20 LAB — TYPE AND SCREEN
ABO/RH(D): O POS
Antibody Screen: NEGATIVE

## 2016-01-20 LAB — BASIC METABOLIC PANEL
ANION GAP: 5 (ref 5–15)
BUN: 10 mg/dL (ref 6–20)
CHLORIDE: 106 mmol/L (ref 101–111)
CO2: 26 mmol/L (ref 22–32)
Calcium: 9.6 mg/dL (ref 8.9–10.3)
Creatinine, Ser: 1.04 mg/dL (ref 0.61–1.24)
GFR calc Af Amer: >60 mL/min (ref >60)
Glucose, Bld: 101 mg/dL — ABNORMAL HIGH (ref 65–99)
POTASSIUM: 4.2 mmol/L (ref 3.5–5.1)
SODIUM: 137 mmol/L (ref 135–145)

## 2016-01-20 LAB — SURGICAL PCR SCREEN
MRSA, PCR: NEGATIVE
STAPHYLOCOCCUS AUREUS: NEGATIVE

## 2016-01-20 MED ORDER — CEFAZOLIN SODIUM-DEXTROSE 2-4 GM/100ML-% IV SOLN
2.0000 g | INTRAVENOUS | Status: AC
  Administered 2016-01-21: 2 g via INTRAVENOUS
  Filled 2016-01-20: qty 100

## 2016-01-21 ENCOUNTER — Encounter (HOSPITAL_COMMUNITY): Payer: Self-pay | Admitting: *Deleted

## 2016-01-21 ENCOUNTER — Inpatient Hospital Stay (HOSPITAL_COMMUNITY): Payer: Managed Care, Other (non HMO) | Admitting: Anesthesiology

## 2016-01-21 ENCOUNTER — Encounter (HOSPITAL_COMMUNITY)
Admission: RE | Disposition: A | Discharge status: Home Health | Payer: Self-pay | Source: Ambulatory Visit | Attending: Neurosurgery

## 2016-01-21 ENCOUNTER — Inpatient Hospital Stay (HOSPITAL_COMMUNITY): Payer: Managed Care, Other (non HMO)

## 2016-01-21 ENCOUNTER — Inpatient Hospital Stay (HOSPITAL_COMMUNITY)
Admission: RE | Admit: 2016-01-21 | Discharge: 2016-01-23 | DRG: 460 | Disposition: A | Discharge status: Home Health | Payer: Managed Care, Other (non HMO) | Source: Ambulatory Visit | Attending: Neurosurgery | Admitting: Neurosurgery

## 2016-01-21 DIAGNOSIS — I10 Essential (primary) hypertension: Secondary | ICD-10-CM | POA: Diagnosis present

## 2016-01-21 DIAGNOSIS — S32009K Unspecified fracture of unspecified lumbar vertebra, subsequent encounter for fracture with nonunion: Secondary | ICD-10-CM | POA: Diagnosis present

## 2016-01-21 DIAGNOSIS — Z885 Allergy status to narcotic agent status: Secondary | ICD-10-CM | POA: Diagnosis not present

## 2016-01-21 DIAGNOSIS — K219 Gastro-esophageal reflux disease without esophagitis: Secondary | ICD-10-CM | POA: Diagnosis present

## 2016-01-21 DIAGNOSIS — M960 Pseudarthrosis after fusion or arthrodesis: Secondary | ICD-10-CM | POA: Diagnosis present

## 2016-01-21 DIAGNOSIS — Y838 Other surgical procedures as the cause of abnormal reaction of the patient, or of later complication, without mention of misadventure at the time of the procedure: Secondary | ICD-10-CM | POA: Diagnosis present

## 2016-01-21 DIAGNOSIS — Z419 Encounter for procedure for purposes other than remedying health state, unspecified: Secondary | ICD-10-CM

## 2016-01-21 DIAGNOSIS — Z886 Allergy status to analgesic agent status: Secondary | ICD-10-CM

## 2016-01-21 DIAGNOSIS — T84028A Dislocation of other internal joint prosthesis, initial encounter: Principal | ICD-10-CM | POA: Diagnosis present

## 2016-01-21 SURGERY — POSTERIOR LUMBAR FUSION 1 LEVEL
Anesthesia: General | Site: Back

## 2016-01-21 MED ORDER — LOSARTAN POTASSIUM 50 MG PO TABS
100.0000 mg | ORAL_TABLET | Freq: Every day | ORAL | Status: DC
  Administered 2016-01-21 – 2016-01-23 (×3): 100 mg via ORAL
  Filled 2016-01-21 (×3): qty 2

## 2016-01-21 MED ORDER — MIDAZOLAM HCL 2 MG/2ML IJ SOLN
INTRAMUSCULAR | Status: AC
  Filled 2016-01-21: qty 2

## 2016-01-21 MED ORDER — FENTANYL CITRATE (PF) 100 MCG/2ML IJ SOLN
INTRAMUSCULAR | Status: DC | PRN
  Administered 2016-01-21: 150 ug via INTRAVENOUS
  Administered 2016-01-21: 50 ug via INTRAVENOUS

## 2016-01-21 MED ORDER — HYDROCODONE-ACETAMINOPHEN 5-325 MG PO TABS: 1.0000 | ORAL_TABLET | Freq: Four times a day (QID) | ORAL | Status: DC | PRN

## 2016-01-21 MED ORDER — ALPRAZOLAM 0.5 MG PO TABS
0.5000 mg | ORAL_TABLET | Freq: Three times a day (TID) | ORAL | Status: DC
  Administered 2016-01-21 – 2016-01-23 (×6): 0.5 mg via ORAL
  Filled 2016-01-21 (×6): qty 1

## 2016-01-21 MED ORDER — MEPERIDINE HCL 25 MG/ML IJ SOLN: 6.2500 mg | INTRAMUSCULAR | Status: DC | PRN

## 2016-01-21 MED ORDER — PROPOFOL 10 MG/ML IV BOLUS
INTRAVENOUS | Status: AC
  Filled 2016-01-21: qty 40

## 2016-01-21 MED ORDER — OMEPRAZOLE MAGNESIUM 20 MG PO TBEC: 40.0000 mg | DELAYED_RELEASE_TABLET | Freq: Every day | ORAL | Status: DC

## 2016-01-21 MED ORDER — HYDROCODONE-ACETAMINOPHEN 5-325 MG PO TABS
1.0000 | ORAL_TABLET | ORAL | Status: DC | PRN
  Administered 2016-01-21: 1 via ORAL

## 2016-01-21 MED ORDER — SURGIFOAM 100 EX MISC
CUTANEOUS | Status: DC | PRN
  Administered 2016-01-21: 11:00:00 via TOPICAL

## 2016-01-21 MED ORDER — CYCLOBENZAPRINE HCL 10 MG PO TABS: 10.0000 mg | ORAL_TABLET | Freq: Two times a day (BID) | ORAL | Status: DC | PRN

## 2016-01-21 MED ORDER — LIDOCAINE-EPINEPHRINE 1 %-1:100000 IJ SOLN
INTRAMUSCULAR | Status: DC | PRN
  Administered 2016-01-21: 5 mL

## 2016-01-21 MED ORDER — BUPIVACAINE LIPOSOME 1.3 % IJ SUSP
20.0000 mL | INTRAMUSCULAR | Status: DC
  Filled 2016-01-21: qty 20

## 2016-01-21 MED ORDER — DIPHENHYDRAMINE HCL 25 MG PO CAPS
25.0000 mg | ORAL_CAPSULE | Freq: Three times a day (TID) | ORAL | Status: DC | PRN
  Administered 2016-01-22: 25 mg via ORAL
  Filled 2016-01-21: qty 1

## 2016-01-21 MED ORDER — BUPIVACAINE HCL (PF) 0.5 % IJ SOLN
INTRAMUSCULAR | Status: DC | PRN
  Administered 2016-01-21: 5 mL

## 2016-01-21 MED ORDER — FENTANYL CITRATE (PF) 250 MCG/5ML IJ SOLN
INTRAMUSCULAR | Status: AC
  Filled 2016-01-21: qty 5

## 2016-01-21 MED ORDER — METHOCARBAMOL 1000 MG/10ML IJ SOLN
500.0000 mg | Freq: Four times a day (QID) | INTRAVENOUS | Status: DC | PRN
  Filled 2016-01-21: qty 5

## 2016-01-21 MED ORDER — ACETAMINOPHEN 325 MG PO TABS
650.0000 mg | ORAL_TABLET | ORAL | Status: DC | PRN
  Administered 2016-01-22: 650 mg via ORAL
  Filled 2016-01-21: qty 2

## 2016-01-21 MED ORDER — METHOCARBAMOL 500 MG PO TABS
ORAL_TABLET | ORAL | Status: AC
  Administered 2016-01-21: 500 mg via ORAL
  Filled 2016-01-21: qty 1

## 2016-01-21 MED ORDER — CEFAZOLIN SODIUM-DEXTROSE 2-4 GM/100ML-% IV SOLN
2.0000 g | Freq: Three times a day (TID) | INTRAVENOUS | Status: AC
  Administered 2016-01-21 – 2016-01-22 (×2): 2 g via INTRAVENOUS
  Filled 2016-01-21 (×2): qty 100

## 2016-01-21 MED ORDER — KCL IN DEXTROSE-NACL 20-5-0.45 MEQ/L-%-% IV SOLN
INTRAVENOUS | Status: DC
  Administered 2016-01-21 – 2016-01-22 (×2): via INTRAVENOUS
  Filled 2016-01-21 (×2): qty 1000

## 2016-01-21 MED ORDER — HYDROCODONE-ACETAMINOPHEN 5-325 MG PO TABS
ORAL_TABLET | ORAL | Status: AC
  Administered 2016-01-21: 1 via ORAL
  Filled 2016-01-21: qty 2

## 2016-01-21 MED ORDER — SODIUM CHLORIDE 0.9% FLUSH: 3.0000 mL | INTRAVENOUS | Status: DC | PRN

## 2016-01-21 MED ORDER — MIDAZOLAM HCL 5 MG/5ML IJ SOLN
INTRAMUSCULAR | Status: DC | PRN
  Administered 2016-01-21: 2 mg via INTRAVENOUS

## 2016-01-21 MED ORDER — HYDROMORPHONE HCL 1 MG/ML IJ SOLN
0.2500 mg | INTRAMUSCULAR | Status: DC | PRN
  Administered 2016-01-21: 0.5 mg via INTRAVENOUS
  Administered 2016-01-21 (×2): 0.25 mg via INTRAVENOUS
  Administered 2016-01-21 (×2): 0.5 mg via INTRAVENOUS

## 2016-01-21 MED ORDER — LACTATED RINGERS IV SOLN
INTRAVENOUS | Status: DC | PRN
  Administered 2016-01-21 (×3): via INTRAVENOUS

## 2016-01-21 MED ORDER — LACTATED RINGERS IV SOLN: INTRAVENOUS | Status: DC

## 2016-01-21 MED ORDER — VITAMIN B-12 1000 MCG PO TABS
1000.0000 ug | ORAL_TABLET | Freq: Every day | ORAL | Status: DC
  Administered 2016-01-22 – 2016-01-23 (×2): 1000 ug via ORAL
  Filled 2016-01-21 (×2): qty 1

## 2016-01-21 MED ORDER — SERTRALINE HCL 50 MG PO TABS
50.0000 mg | ORAL_TABLET | Freq: Every day | ORAL | Status: DC
  Administered 2016-01-22 – 2016-01-23 (×2): 50 mg via ORAL
  Filled 2016-01-21 (×2): qty 1

## 2016-01-21 MED ORDER — ALLOPURINOL 100 MG PO TABS
100.0000 mg | ORAL_TABLET | Freq: Every day | ORAL | Status: DC
  Administered 2016-01-22 – 2016-01-23 (×2): 100 mg via ORAL
  Filled 2016-01-21 (×2): qty 1

## 2016-01-21 MED ORDER — FAMOTIDINE IN NACL 20-0.9 MG/50ML-% IV SOLN
20.0000 mg | Freq: Two times a day (BID) | INTRAVENOUS | Status: DC
  Administered 2016-01-21 – 2016-01-22 (×2): 20 mg via INTRAVENOUS
  Filled 2016-01-21 (×2): qty 50

## 2016-01-21 MED ORDER — POLYETHYLENE GLYCOL 3350 17 G PO PACK: 17.0000 g | PACK | Freq: Every day | ORAL | Status: DC | PRN

## 2016-01-21 MED ORDER — LIDOCAINE HCL (CARDIAC) 20 MG/ML IV SOLN
INTRAVENOUS | Status: DC | PRN
  Administered 2016-01-21: 50 mg via INTRAVENOUS

## 2016-01-21 MED ORDER — FLEET ENEMA 7-19 GM/118ML RE ENEM: 1.0000 | ENEMA | Freq: Once | RECTAL | Status: DC | PRN

## 2016-01-21 MED ORDER — HYDROMORPHONE HCL 1 MG/ML IJ SOLN
INTRAMUSCULAR | Status: AC
  Administered 2016-01-21: 0.5 mg via INTRAVENOUS
  Filled 2016-01-21: qty 1

## 2016-01-21 MED ORDER — 0.9 % SODIUM CHLORIDE (POUR BTL) OPTIME
TOPICAL | Status: DC | PRN
  Administered 2016-01-21: 1000 mL

## 2016-01-21 MED ORDER — COLCHICINE 0.6 MG PO TABS: 0.6000 mg | ORAL_TABLET | Freq: Every day | ORAL | Status: DC | PRN

## 2016-01-21 MED ORDER — PHENOL 1.4 % MT LIQD: 1.0000 | OROMUCOSAL | Status: DC | PRN

## 2016-01-21 MED ORDER — METHOCARBAMOL 500 MG PO TABS
500.0000 mg | ORAL_TABLET | Freq: Four times a day (QID) | ORAL | Status: DC | PRN
  Administered 2016-01-21 – 2016-01-23 (×4): 500 mg via ORAL
  Filled 2016-01-21 (×3): qty 1

## 2016-01-21 MED ORDER — HYDROMORPHONE HCL 1 MG/ML IJ SOLN
INTRAMUSCULAR | Status: AC
  Administered 2016-01-21: 0.25 mg via INTRAVENOUS
  Filled 2016-01-21: qty 1

## 2016-01-21 MED ORDER — HYDROMORPHONE HCL 1 MG/ML IJ SOLN
0.5000 mg | INTRAMUSCULAR | Status: DC | PRN
  Administered 2016-01-21 – 2016-01-22 (×4): 1 mg via INTRAVENOUS
  Filled 2016-01-21 (×4): qty 1

## 2016-01-21 MED ORDER — LACTATED RINGERS IV SOLN
INTRAVENOUS | Status: DC
  Administered 2016-01-21: 07:00:00 via INTRAVENOUS

## 2016-01-21 MED ORDER — MENTHOL 3 MG MT LOZG: 1.0000 | LOZENGE | OROMUCOSAL | Status: DC | PRN

## 2016-01-21 MED ORDER — ACETAMINOPHEN 650 MG RE SUPP: 650.0000 mg | RECTAL | Status: DC | PRN

## 2016-01-21 MED ORDER — SUGAMMADEX SODIUM 200 MG/2ML IV SOLN
INTRAVENOUS | Status: DC | PRN
  Administered 2016-01-21: 147.8 mg via INTRAVENOUS

## 2016-01-21 MED ORDER — PROPOFOL 10 MG/ML IV BOLUS
INTRAVENOUS | Status: DC | PRN
  Administered 2016-01-21: 200 mg via INTRAVENOUS

## 2016-01-21 MED ORDER — ROCURONIUM BROMIDE 100 MG/10ML IV SOLN
INTRAVENOUS | Status: DC | PRN
  Administered 2016-01-21: 10 mg via INTRAVENOUS
  Administered 2016-01-21: 50 mg via INTRAVENOUS
  Administered 2016-01-21 (×4): 10 mg via INTRAVENOUS

## 2016-01-21 MED ORDER — ONDANSETRON HCL 4 MG/2ML IJ SOLN
INTRAMUSCULAR | Status: DC | PRN
  Administered 2016-01-21: 4 mg via INTRAVENOUS

## 2016-01-21 MED ORDER — HYDROMORPHONE HCL 2 MG PO TABS
2.0000 mg | ORAL_TABLET | ORAL | Status: DC | PRN
  Administered 2016-01-21: 2 mg via ORAL
  Administered 2016-01-22 – 2016-01-23 (×8): 4 mg via ORAL
  Filled 2016-01-21: qty 1
  Filled 2016-01-21 (×8): qty 2

## 2016-01-21 MED ORDER — SODIUM CHLORIDE 0.9 % IV SOLN: 250.0000 mL | INTRAVENOUS | Status: DC

## 2016-01-21 MED ORDER — ZOLPIDEM TARTRATE 5 MG PO TABS: 5.0000 mg | ORAL_TABLET | Freq: Every evening | ORAL | Status: DC | PRN

## 2016-01-21 MED ORDER — BISACODYL 10 MG RE SUPP: 10.0000 mg | Freq: Every day | RECTAL | Status: DC | PRN

## 2016-01-21 MED ORDER — ONDANSETRON HCL 4 MG/2ML IJ SOLN: 4.0000 mg | INTRAMUSCULAR | Status: DC | PRN

## 2016-01-21 MED ORDER — DILTIAZEM HCL ER COATED BEADS 240 MG PO CP24
240.0000 mg | ORAL_CAPSULE | Freq: Every day | ORAL | Status: DC
  Administered 2016-01-22 – 2016-01-23 (×2): 240 mg via ORAL
  Filled 2016-01-21 (×2): qty 1

## 2016-01-21 MED ORDER — PANTOPRAZOLE SODIUM 40 MG PO TBEC
80.0000 mg | DELAYED_RELEASE_TABLET | Freq: Every day | ORAL | Status: DC
  Administered 2016-01-22 – 2016-01-23 (×2): 80 mg via ORAL
  Filled 2016-01-21 (×2): qty 2

## 2016-01-21 MED ORDER — ALUM & MAG HYDROXIDE-SIMETH 200-200-20 MG/5ML PO SUSP: 30.0000 mL | Freq: Four times a day (QID) | ORAL | Status: DC | PRN

## 2016-01-21 MED ORDER — METOCLOPRAMIDE HCL 5 MG/ML IJ SOLN: 10.0000 mg | Freq: Once | INTRAMUSCULAR | Status: DC | PRN

## 2016-01-21 MED ORDER — SODIUM CHLORIDE 0.9% FLUSH
3.0000 mL | Freq: Two times a day (BID) | INTRAVENOUS | Status: DC
  Administered 2016-01-22: 3 mL via INTRAVENOUS

## 2016-01-21 MED ORDER — DOCUSATE SODIUM 100 MG PO CAPS
100.0000 mg | ORAL_CAPSULE | Freq: Two times a day (BID) | ORAL | Status: DC
  Administered 2016-01-21 – 2016-01-23 (×4): 100 mg via ORAL
  Filled 2016-01-21 (×4): qty 1

## 2016-01-21 SURGICAL SUPPLY — 91 items
ADH SKN CLS APL DERMABOND .7 (GAUZE/BANDAGES/DRESSINGS) ×1
APL SKNCLS STERI-STRIP NONHPOA (GAUZE/BANDAGES/DRESSINGS) ×1
BENZOIN TINCTURE PRP APPL 2/3 (GAUZE/BANDAGES/DRESSINGS) ×2 IMPLANT
BLADE CLIPPER SURG (BLADE) IMPLANT
BONE CANC CHIPS 20CC PCAN1/4 (Bone Implant) ×2 IMPLANT
BUR MATCHSTICK NEURO 3.0 LAGG (BURR) ×2 IMPLANT
BUR PRECISION FLUTE 5.0 (BURR) ×2 IMPLANT
CANISTER SUCT 3000ML PPV (MISCELLANEOUS) ×2 IMPLANT
CHIPS CANC BONE 20CC PCAN1/4 (Bone Implant) ×1 IMPLANT
CONNECTOR RELINE 20MM OPEN OFF (Connector) ×2 IMPLANT
CONT SPEC 4OZ CLIKSEAL STRL BL (MISCELLANEOUS) ×2 IMPLANT
COVER BACK TABLE 60X90IN (DRAPES) ×2 IMPLANT
DECANTER SPIKE VIAL GLASS SM (MISCELLANEOUS) ×2 IMPLANT
DERMABOND ADVANCED (GAUZE/BANDAGES/DRESSINGS) ×1
DERMABOND ADVANCED .7 DNX12 (GAUZE/BANDAGES/DRESSINGS) ×1 IMPLANT
DRAPE C-ARM 42X72 X-RAY (DRAPES) ×3 IMPLANT
DRAPE C-ARMOR (DRAPES) ×1 IMPLANT
DRAPE LAPAROTOMY 100X72X124 (DRAPES) ×2 IMPLANT
DRAPE POUCH INSTRU U-SHP 10X18 (DRAPES) ×2 IMPLANT
DRAPE SURG 17X23 STRL (DRAPES) ×2 IMPLANT
DRSG OPSITE POSTOP 4X6 (GAUZE/BANDAGES/DRESSINGS) ×1 IMPLANT
DURAPREP 26ML APPLICATOR (WOUND CARE) ×2 IMPLANT
ELECT BLADE 4.0 EZ CLEAN MEGAD (MISCELLANEOUS) ×2
ELECT REM PT RETURN 9FT ADLT (ELECTROSURGICAL) ×2
ELECTRODE BLDE 4.0 EZ CLN MEGD (MISCELLANEOUS) IMPLANT
ELECTRODE REM PT RTRN 9FT ADLT (ELECTROSURGICAL) ×1 IMPLANT
EVACUATOR 1/8 PVC DRAIN (DRAIN) ×2 IMPLANT
GAUZE SPONGE 4X4 12PLY STRL (GAUZE/BANDAGES/DRESSINGS) ×2 IMPLANT
GAUZE SPONGE 4X4 16PLY XRAY LF (GAUZE/BANDAGES/DRESSINGS) IMPLANT
GLOVE BIO SURGEON STRL SZ8 (GLOVE) ×5 IMPLANT
GLOVE BIOGEL PI IND STRL 7.5 (GLOVE) IMPLANT
GLOVE BIOGEL PI IND STRL 8 (GLOVE) ×2 IMPLANT
GLOVE BIOGEL PI IND STRL 8.5 (GLOVE) ×2 IMPLANT
GLOVE BIOGEL PI INDICATOR 7.5 (GLOVE) ×3
GLOVE BIOGEL PI INDICATOR 8 (GLOVE) ×2
GLOVE BIOGEL PI INDICATOR 8.5 (GLOVE) ×2
GLOVE ECLIPSE 7.5 STRL STRAW (GLOVE) ×2 IMPLANT
GLOVE ECLIPSE 8.0 STRL XLNG CF (GLOVE) ×4 IMPLANT
GLOVE EXAM NITRILE LRG STRL (GLOVE) IMPLANT
GLOVE EXAM NITRILE MD LF STRL (GLOVE) IMPLANT
GLOVE EXAM NITRILE XL STR (GLOVE) IMPLANT
GLOVE EXAM NITRILE XS STR PU (GLOVE) IMPLANT
GLOVE INDICATOR 8.5 STRL (GLOVE) ×1 IMPLANT
GLOVE SURG SS PI 7.0 STRL IVOR (GLOVE) ×5 IMPLANT
GOWN STRL REUS W/ TWL LRG LVL3 (GOWN DISPOSABLE) IMPLANT
GOWN STRL REUS W/ TWL XL LVL3 (GOWN DISPOSABLE) ×2 IMPLANT
GOWN STRL REUS W/TWL 2XL LVL3 (GOWN DISPOSABLE) ×4 IMPLANT
GOWN STRL REUS W/TWL LRG LVL3 (GOWN DISPOSABLE)
GOWN STRL REUS W/TWL XL LVL3 (GOWN DISPOSABLE) ×8
GRAFT BNE CANC CHIPS 1-8 20CC (Bone Implant) IMPLANT
KIT BASIN OR (CUSTOM PROCEDURE TRAY) ×2 IMPLANT
KIT INFUSE X SMALL 1.4CC (Orthopedic Implant) ×1 IMPLANT
KIT POSITION SURG JACKSON T1 (MISCELLANEOUS) ×2 IMPLANT
KIT ROOM TURNOVER OR (KITS) ×2 IMPLANT
MILL MEDIUM DISP (BLADE) ×2 IMPLANT
NDL HYPO 21X1.5 SAFETY (NEEDLE) IMPLANT
NDL HYPO 25X1 1.5 SAFETY (NEEDLE) ×1 IMPLANT
NDL SPNL 18GX3.5 QUINCKE PK (NEEDLE) IMPLANT
NEEDLE HYPO 21X1.5 SAFETY (NEEDLE) ×2 IMPLANT
NEEDLE HYPO 25X1 1.5 SAFETY (NEEDLE) ×2 IMPLANT
NEEDLE SPNL 18GX3.5 QUINCKE PK (NEEDLE) IMPLANT
NS IRRIG 1000ML POUR BTL (IV SOLUTION) ×2 IMPLANT
PACK LAMINECTOMY NEURO (CUSTOM PROCEDURE TRAY) ×2 IMPLANT
PAD ARMBOARD 7.5X6 YLW CONV (MISCELLANEOUS) ×6 IMPLANT
PATTIES SURGICAL .5 X.5 (GAUZE/BANDAGES/DRESSINGS) IMPLANT
PATTIES SURGICAL .5 X1 (DISPOSABLE) IMPLANT
PATTIES SURGICAL 1X1 (DISPOSABLE) ×1 IMPLANT
RASP 3.0MM (RASP) ×1 IMPLANT
ROD RELIN-O LORD 5.5X65MM (Rod) ×1 IMPLANT
ROD RELINE O-H CON M 5.0/6.0MM (Rod) ×2 IMPLANT
ROD RELINE-O LORDOTIC 5.5X60MM (Rod) ×1 IMPLANT
SCREW LOCK RELINE 5.5 TULIP (Screw) ×6 IMPLANT
SCREW RELINE-O 8.5X45MM POLY (Screw) ×1 IMPLANT
SCREW RELINE-O POLY 7.5X50 (Screw) ×2 IMPLANT
SCREW RLINE PLY 2S 50X7.5XPA (Screw) IMPLANT
SPONGE LAP 4X18 X RAY DECT (DISPOSABLE) IMPLANT
SPONGE SURGIFOAM ABS GEL 100 (HEMOSTASIS) ×2 IMPLANT
STAPLER SKIN PROX WIDE 3.9 (STAPLE) ×1 IMPLANT
STRIP CLOSURE SKIN 1/2X4 (GAUZE/BANDAGES/DRESSINGS) ×2 IMPLANT
SUT VIC AB 1 CT1 18XBRD ANBCTR (SUTURE) ×2 IMPLANT
SUT VIC AB 1 CT1 8-18 (SUTURE) ×4
SUT VIC AB 2-0 CT1 18 (SUTURE) ×4 IMPLANT
SUT VIC AB 3-0 SH 8-18 (SUTURE) ×4 IMPLANT
SYR 20CC LL (SYRINGE) ×1 IMPLANT
SYR 3ML LL SCALE MARK (SYRINGE) ×4 IMPLANT
SYR 5ML LL (SYRINGE) IMPLANT
TOWEL OR 17X24 6PK STRL BLUE (TOWEL DISPOSABLE) ×2 IMPLANT
TOWEL OR 17X26 10 PK STRL BLUE (TOWEL DISPOSABLE) ×2 IMPLANT
TRAP SPECIMEN MUCOUS 40CC (MISCELLANEOUS) ×2 IMPLANT
TRAY FOLEY W/METER SILVER 16FR (SET/KITS/TRAYS/PACK) ×2 IMPLANT
WATER STERILE IRR 1000ML POUR (IV SOLUTION) ×2 IMPLANT

## 2016-01-21 NOTE — Anesthesia Postprocedure Evaluation (Signed)
Anesthesia Post Note  Patient: Aaron Black  Procedure(s) Performed: Procedure(s) (LRB):  Lumbar Four-Five  Posterior Exploration and Revision of Posterior Fusion (N/A)  Patient location during evaluation: PACU Anesthesia Type: General Level of consciousness: awake and alert Pain management: pain level controlled Vital Signs Assessment: post-procedure vital signs reviewed and stable Respiratory status: spontaneous breathing, nonlabored ventilation, respiratory function stable and patient connected to nasal cannula oxygen Cardiovascular status: blood pressure returned to baseline and stable Postop Assessment: no signs of nausea or vomiting Anesthetic complications: no    Last Vitals:  Filed Vitals:   01/21/16 0644 01/21/16 1320  BP: 135/96   Pulse: 72   Temp: 36.6 C 36.6 C  Resp: 20     Last Pain:  Filed Vitals:   01/21/16 1328  PainSc: 10-Worst pain ever                 Montez Hageman

## 2016-01-21 NOTE — Anesthesia Preprocedure Evaluation (Signed)
Anesthesia Evaluation  Patient identified by MRN, date of birth, ID band Patient awake    Reviewed: Allergy & Precautions, H&P , NPO status , Patient's Chart, lab work & pertinent test results  Airway Mallampati: II  TM Distance: >3 FB Neck ROM: Full    Dental  (+) Dental Advisory Given   Pulmonary sleep apnea , Current Smoker,    breath sounds clear to auscultation       Cardiovascular hypertension, Pt. on medications  Rhythm:Regular Rate:Normal     Neuro/Psych    GI/Hepatic Neg liver ROS, GERD  Medicated and Controlled,  Endo/Other  negative endocrine ROS  Renal/GU negative Renal ROS     Musculoskeletal  (+) Arthritis ,   Abdominal   Peds  Hematology   Anesthesia Other Findings   Reproductive/Obstetrics                             Anesthesia Physical  Anesthesia Plan  ASA: II  Anesthesia Plan: General   Post-op Pain Management:    Induction: Intravenous  Airway Management Planned: Oral ETT  Additional Equipment:   Intra-op Plan:   Post-operative Plan: Extubation in OR  Informed Consent: I have reviewed the patients History and Physical, chart, labs and discussed the procedure including the risks, benefits and alternatives for the proposed anesthesia with the patient or authorized representative who has indicated his/her understanding and acceptance.   Dental advisory given  Plan Discussed with: CRNA, Anesthesiologist and Surgeon  Anesthesia Plan Comments:         Anesthesia Quick Evaluation

## 2016-01-21 NOTE — Progress Notes (Signed)
Awake, alert, conversant.  MAEW with good strength.  Left leg pain is resolved and strength appears full.

## 2016-01-21 NOTE — Progress Notes (Signed)
Pt request a new brace. Ortho tech paged. On coming RN aware to repage ortho tech and pass on message.   Ave Filter, RN

## 2016-01-21 NOTE — Interval H&P Note (Signed)
History and Physical Interval Note:  01/21/2016 9:34 AM  Aaron Black  has presented today for surgery, with the diagnosis of Pseudoarthrosis of lumbar spine; Lumbar stenosis; Disc displacement of lumbar spine  The various methods of treatment have been discussed with the patient and family. After consideration of risks, benefits and other options for treatment, the patient has consented to  Procedure(s) with comments: Right L4-5  posterior exploration and revision of posterior fusion (N/A) - Right L4-5  posterior exploration and revision of posterior fusion as a surgical intervention .  The patient's history has been reviewed, patient examined, no change in status, stable for surgery.  I have reviewed the patient's chart and labs.  Questions were answered to the patient's satisfaction.     [name] D

## 2016-01-21 NOTE — Brief Op Note (Signed)
01/21/2016  1:18 PM  PATIENT:  Aaron Black  60 y.o. male  PRE-OPERATIVE DIAGNOSIS:  Pseudoarthrosis of lumbar spine; Lumbar stenosis; Disc displacement of lumbar spine L 4 5 level, lumbar radiculopathy  POST-OPERATIVE DIAGNOSIS:  Pseudoarthrosis of lumbar spine; Lumbar stenosis; Disc displacement of lumbar spine L 4 5 level, lumbar radiculopathy   PROCEDURE:  Procedure(s) with comments:  Lumbar Four-Five  Posterior Exploration and Revision of Posterior Fusion (N/A) -  Lumbar Four-Five Posterior Exploration and Revision of Posterior Fusion with complete facetectomy with revision of anterolateral cage through posterior approach with revision of posterior hardware and posterolateral arthrodesis  SURGEON:  Surgeon(s) and Role:    * Erline Levine, MD - Primary    * Kary Kos, MD - Assisting  PHYSICIAN ASSISTANT:   ASSISTANTS: Poteat, RN   ANESTHESIA:   general  EBL:  Total I/O In: 2700 [I.V.:2700] Out: [id number] [Urine:560; Blood:575]  BLOOD ADMINISTERED:none  DRAINS: none   LOCAL MEDICATIONS USED:  MARCAINE     SPECIMEN:  No Specimen  DISPOSITION OF SPECIMEN:  N/A  COUNTS:  YES  TOURNIQUET:  * No tourniquets in log *  DICTATION: Patient is 60 year old man with lumbar scoliosis and previous fusion L 1-5 levels with pseudoarthrosis at  L 45 with posterolateral migration of L 45 XLIF cage with Left L 4 nerve root compression in extraforaminal space.  It was elected to revise cage and posterior hardware.     Procedure: Patient was placed in a prone position on the [place] table after smooth and uncomplicated induction of general endotracheal anesthesia. His low back was prepped and draped in usual sterile fashion with betadine scrub and DuraPrep. Area of incision was infiltrated with local lidocaine. Incision was made to the lumbodorsal fascia was incised and exposure was performed of the L 3 - L 5 spinous processes laminae facet joint and transverse processes. Previous hardware was  exposed.   The previously placed bone screws were exposed and the rods were cut and loosened L 5 screws were removed. A total hemi- laminectomy and completed facetectomy of L 4 -5 on the left was performed  With wide decompression of the thecal sac and left L 4 nerve root.  A thorough discectomy with removal annulus and disc material was performed to expose the XLIF cage.  This was then tamped away from the nerve root and the L 4 nerve root was decompressed.  X ray demonstrated the cage was successfully moved out of the neural foramen and away from the nerve root. The posterolateral region was extensively decorticated on the right the previously placed L 5 screws were replaced (8.5 x 45 on the right and 6.5 x 50 on the left).  Connectors were affixed to the rods on each side and the construct was placed in compression.  5.5 mm rods were placed and connected through offset connectors.  Final X ray demonstrated well positioned screws and rods and implants.   The posterolateral region was packed with 20 cc bone graft on the right and extra-small BMP along with autograft. The wound was irrigated. Fascia was closed with 1 Vicryl sutures skin edges were reapproximated 2 and 3-0 Vicryl sutures. Subcutaneous tissues were infiltrated with long-acting Marcaine.  The wound was dressed with Dermabond and  an occlusive dressing the patient was extubated in the operating room and taken to recovery in stable satisfactory condition she tolerated traction well counts were correct at the end of the case.   PLAN OF CARE: Admit to inpatient  PATIENT DISPOSITION:  PACU - hemodynamically stable.   Delay start of Pharmacological VTE agent (>24hrs) due to surgical blood loss or risk of bleeding: yes

## 2016-01-21 NOTE — H&P (Signed)
Patient ID:   [id number] Patient: Aaron Black  Date of Birth: 04-17-1956 Visit Type: Office Visit   Date: 01/17/2016 09:30 AM Provider: Marchia Meiers. Vertell Limber MD   This 60 year old male presents for back pain.  History of Present Illness: 1.  back pain    The patient comes in to review his CT results. The patient states he continues to have low back and left leg pain that radiates into his ankle.   01/13/16 CT: Patient's left leg pain likely results from posterior lateral migration of the left L4-5 interbody cage. Left L4 nerve root impingement is likely. Correlation with prior MR reveals significant enhancement of the vertebral bodies above and below L4-5; diskitis and osteomyelitis not excluded. Alternatively, the MR appearance could be related to pseudoarthritis at L4-5, as there is significant lucency surrounding the right L5 screw.     My impression is that this set up imaging findings is consistent with chronic nonunion and pseudoarthrosis rather than with discitis.  I reviewed the imaging studies with the patient and his wife and this shows progressive migration of the cage and clear evidence of nonunion at the previously operated level of L4 L5.  The remaining cages appeared to be satisfactory position without evidence of migration or pseudoarthrosis.  The patient is continuing to complain of left leg pain in the distribution of the left L4 nerve.  He is not able to bear full weight on his left leg because of the severity of his pain.  PAST MEDICAL HISTORY, SURGICAL HISTORY, FAMILY HISTORY, SOCIAL HISTORY AND REVIEW OF SYSTEMS  12/22/2015, which I have signed.   MEDICATIONS(added, continued or stopped this visit): Started Medication Directions Instruction Stopped  01/12/2016 Dilaudid 2 mg tablet take 1-2 tablet by oral route  every  6 hours as needed     diltiazem CD 240 mg capsule,extended release 24 hr take 1 capsule by oral route  every day     lisinopril 20 mg tablet take 1  tablet by oral route  every day     omeprazole 20 mg capsule,delayed release take 1 capsule by oral route  every day 30 minutes to 1 hour before a meal     Xanax 0.5 mg tablet take 1 tablet by oral route 3 times every day       ALLERGIES: Ingredient Reaction Medication Name Comment  OXYCODONE TEREPHTHALATE Rash Percodan   ASPIRIN Rash Percodan   OXYCODONE HCL Rash Percodan       Vitals Date Temp F BP Pulse Ht In Wt Lb BMI BSA Pain Score  01/17/2016  161/110 101 67 163 25.53  7/10      IMPRESSION The patient continues to experience residual low back and left leg pain that radiates into his ankle. On review of his CT, his left L4-5 interbody cage from right-sided access has migrated as it did not fully heal. Subsequently, this is causing left L4 nerve compression. We will need to do a redo L4-5 XLIF for alleviation of his low back and left leg symptoms. on discussion with representatives from [facility], it was felt that he would be safer for the patient to avoid XLIF approach altogether and to perform revision entirely posteriorly with repositioning of cage after posterior decompression of nerve elements and then with revision of posterior hardware and with posterolateral arthrodesis with the goal of relieving his radicular symptoms, but that lateral redo approach is too potentially risky to the patient.   Pain Assessment/Treatment Pain Scale: 7/10. Method: Numeric Pain  Intensity Scale. Location: back. Onset: 11/21/2013. Duration: varies. Quality: discomforting. Pain Assessment/Treatment follow-up plan of care: Patient is taking medications prescribed.  Fall Risk Plan The patient has not fallen in the last year.  Schedule revision L 45 fusion with posterolateral fusion. He still has his LSO brace from his previous surgery. Nurse education given. I will get his wife more FMLA for more time with her husband after surgery.              Provider:  Marchia Meiers. Vertell Limber MD  01/17/2016  01:31 PM Dictation edited by: Johnella Moloney    CC Providers: [place],  [place]  [id number]-   Juanita Craver [facility] [address] [place], [place] [address]-              Electronically signed by Marchia Meiers. Vertell Limber MD on 01/17/2016 01:39 PM  Patient ID:   [id number] Patient: Aaron Black  Date of Birth: [birth year] Visit Type: Office Visit   Date: 01/03/2016 01:00 PM Provider: Marchia Meiers. Vertell Limber MD   This 60 year old male presents for back pain.  History of Present Illness: 1.  back pain    Patient returns to review his MRI. He continues having pain on his left side.  MRI on [place]  12/29/15 MRI: At L4-5 there is interbody fusion with posterior disc bulging of residual disc material falttening the ventral thecal sac and resulting in bilateral lateral recess stenosis. The interbody spacer is mildly laterally displaced from the disc space with mass effect on the left extra foraminal L4 nerve root. Posterior lumbar interbody fusion from L1 through L5 with bilateral pedicle screws at each level. Severe marrow edema on either side of the L4-5 disc space likely reactive and may signify a increase motion across this disc space.      Medical/Surgical/Interim History Reviewed, no change.  Last detailed document date:12/22/2015.   PAST MEDICAL HISTORY, SURGICAL HISTORY, FAMILY HISTORY, SOCIAL HISTORY AND REVIEW OF SYSTEMS I have reviewed the patient's past medical, surgical, family and social history as well as the comprehensive review of systems as included on the [facility] history form dated 12/22/2015, which I have signed.  Family History: Reviewed, no changes.  Last detailed document: 12/22/2015.   Social History: Tobacco use reviewed. Reviewed, no changes. Last detailed document date: 12/22/2015.      MEDICATIONS(added, continued or stopped this  visit): Started Medication Directions Instruction Stopped  06/29/2014 Dilaudid 4 mg tablet take 1 po q6hours prn pain     diltiazem CD 240 mg capsule,extended release 24 hr take 1 capsule by oral route  every day     lisinopril 20 mg tablet take 1 tablet by oral route  every day    05/03/2015 morphine 15 mg immediate release tablet take 1 tablet by oral route 2 times every day as needed    10/07/2014 Neurontin 100 mg capsule Take 1 capsule TID x 3 days, then 2 capsules TID x 3 days, then 3 capsules TID     omeprazole 20 mg capsule,delayed release take 1 capsule by oral route  every day 30 minutes to 1 hour before a meal    05/03/2015 tizanidine 4 mg tablet Take 1 po TID prn spasm    12/23/2015 Valium 5 mg tablet take 1 tablet by oral route 30 minutes prior to MRI     voltaren 75mg  ORAL      Xanax 0.5 mg  tablet take 1 tablet by oral route 3 times every day    09/08/2013 z-pak  ORAL Take as directed     Zocor 20 mg tablet take 1 tablet by oral route  every day in the evening       ALLERGIES: Ingredient Reaction Medication Name Comment  OXYCODONE TEREPHTHALATE Rash Percodan   ASPIRIN Rash Percodan   OXYCODONE HCL Rash Percodan       Vitals Date Temp F BP Pulse Ht In Wt Lb BMI BSA Pain Score  01/03/2016  136/83 98 67 165 25.84  10/10      IMPRESSION The patient is experiencing left sided low back and leg pain. On review of his MRI, he may have some extraforaminalL4 nerve root compression possible pseudoarthrosis at L4 L5. I will order a CT for further workup of his left-sided symptoms.he may require revision surgery if this proved to be the case with his new imaging studies.  Assessment/Plan # Detail Type Description   1. Assessment Spondylolisthesis, lumbar region (M43.16).       2. Assessment Radiculopathy, lumbar region (M54.16).       3. Assessment Low back pain (M54.5).       4. Assessment Disc displacement, lumbar (M51.26).       5. Assessment Spinal stenosis of lumbar  region (M48.06).         Pain Assessment/Treatment Pain Scale: 10/10. Method: Numeric Pain Intensity Scale. Location: back. Onset: 11/21/2013. Duration: varies. Quality: discomforting. Pain Assessment/Treatment follow-up plan of care: Patient is taking medications prescribed.  Fall Risk Plan The patient has not fallen in the last year.  Ordered CT scan. Follow up after to discuss.   Orders: Diagnostic Procedures: Assessment Procedure  M54.16 CT L-Spine W/o Contrast             Provider:  Marchia Meiers. Vertell Limber MD  01/03/2016 02:14 PM Dictation edited by: Marchia Meiers. Vertell Limber    CC Providers: [place],  [place]  [id number]-   Juanita Craver [facility] [address] [place], [place] [address]-              Electronically signed by Marchia Meiers. Vertell Limber MD on 01/03/2016 02:15 PM  Patient ID:   [id number] Patient: Aaron Black  Date of Birth: Sep 08, 1955 Visit Type: Office Visit   Date: 12/22/2015 10:45 AM Provider: Marchia Meiers. Vertell Limber MD   This 60 year old male presents for back pain.  History of Present Illness: 1.  back pain    06/05/14 right anterolateral interbody fusion L1 through L5 levels with posterior percutaneous pedicle screws  Aaron Black returns, reporting increased lumbar and left hip to shin pain 1 month.  He recalls no falls or other injury.  Patient reports no position of comfort, but pain increases with walking.  Voltaren was stopped in February by his primary care physician.  Vicodin 5/325 continues 2 per day. He cannot lay on his left side when sleeping.  Physical Exam: Motor strength is full and intact.  he has weakness in left leg diffusely per patient        PAST MEDICAL/SURGICAL HISTORY   (Detailed)  Disease/disorder Onset Date Management Date Comments  High cholesterol      Hypertension       DIAGNOSTICS HISTORY: Test Ordered Ordering Comments Modifier   Lumbar Spine- AP/Lat/Flex/Ex 04/27/2014    Lumbar Spine- AP/Lat 06/09/2014    Lumbar Spine- AP/Lat 07/01/2014    Cervical Spine- AP/Lat  07/01/2014    Lumbar Spine- AP/Lat 08/03/2014    Lumbar Spine- AP/Lat/Flex/Ex 09/07/2015      PAST MEDICAL HISTORY, SURGICAL HISTORY, FAMILY HISTORY, SOCIAL HISTORY AND REVIEW OF SYSTEMS  09/08/2015, which I have signed.  Family History  (Detailed)   SOCIAL HISTORY  (Detailed) Tobacco use reviewed. Preferred language is [place].   MARITAL STATUS/FAMILY/SOCIAL SUPPORT Currently married.   Smoking status: Light tobacco smoker.  SMOKING STATUS Use Status Type Smoking Status Usage Per Day Years Used Total Pack Years  yes Cigarette Light tobacco smoker 1 Cigarettes      HOME ENVIRONMENT/SAFETY The patient has not fallen in the last year.        MEDICATIONS(added, continued or stopped this visit): Started Medication Directions Instruction Stopped  06/29/2014 Dilaudid 4 mg tablet take 1 po q6hours prn pain     diltiazem CD 240 mg capsule,extended release 24 hr take 1 capsule by oral route  every day     lisinopril 20 mg tablet take 1 tablet by oral route  every day    05/03/2015 morphine 15 mg immediate release tablet take 1 tablet by oral route 2 times every day as needed    10/07/2014 Neurontin 100 mg capsule Take 1 capsule TID x 3 days, then 2 capsules TID x 3 days, then 3 capsules TID     omeprazole 20 mg capsule,delayed release take 1 capsule by oral route  every day 30 minutes to 1 hour before a meal    05/03/2015 tizanidine 4 mg tablet Take 1 po TID prn spasm     voltaren 75mg  ORAL      Xanax 0.5 mg tablet take 1 tablet by oral route 3 times every day    09/08/2013 z-pak  ORAL Take as directed     Zocor 20 mg tablet take 1 tablet by oral route  every day in the evening       ALLERGIES: Ingredient Reaction Medication Name Comment  OXYCODONE TEREPHTHALATE Rash Percodan   ASPIRIN Rash Percodan   OXYCODONE HCL Rash Percodan        Vitals Date Temp F BP Pulse Ht In Wt Lb BMI BSA Pain Score  12/22/2015  146/92 108 67 163.2 25.56  8/10      IMPRESSION The patient is experiencing low back and left hip pain that radiates down to his shin. His symptoms are worsening since his last visit, so I will order a new lumbar MRI for further workup of his symptoms.    Pain Assessment/Treatment Pain Scale: 8/10. Method: Numeric Pain Intensity Scale. Location: back. Onset: 11/21/2013. Duration: varies. Quality: discomforting. Pain Assessment/Treatment follow-up plan of care: Patient is taking medications prescribed.  Fall Risk Plan The patient has not fallen in the last year.  Ordered lumbar MRI. Follow up after to discuss.              Provider:  Marchia Meiers. Vertell Limber MD  12/22/2015 12:29 PM Dictation edited by: Johnella Moloney    CC Providers: [place],  [place]  [id number]   Juanita Craver [facility] [address] [place], [place] [address]-              Electronically signed by Marchia Meiers. Vertell Limber MD on 12/22/2015 12:32 PM  Patient ID:   [id number] Patient: Aaron Black  Date of Birth: 07-Nov-1955 Visit Type: Office Visit   Date: 09/08/2015 10:15 AM Provider: Marchia Meiers. Vertell Limber MD   This 60  year old male presents for back pain.  History of Present Illness: 1.  back pain  Not taking Morphine, muscles relaxants. Back on Vicodin, once a day.  LE strength has improved. States he has improved since before surgery. X-rays good. Stopped smoking.      Medical/Surgical/Interim History Reviewed, no change.  Last detailed document date:07/07/2013.   PAST MEDICAL HISTORY, SURGICAL HISTORY, FAMILY HISTORY, SOCIAL HISTORY AND REVIEW OF SYSTEMS I have reviewed the patient's past medical, surgical, family and social history as well as the comprehensive review of systems as included on the [facility] history form dated 05/03/2015, which I have signed.  Family History: Reviewed, no changes.  Last detailed document: 07/07/2013.  Patient reports there is no relevant family history.   Social History: Tobacco use reviewed. Reviewed, no changes. Last detailed document date: 07/07/2013.      MEDICATIONS(added, continued or stopped this visit): Started Medication Directions Instruction Stopped  06/29/2014 Dilaudid 4 mg tablet take 1 po q6hours prn pain     diltiazem CD 240 mg capsule,extended release 24 hr take 1 capsule by oral route  every day     lisinopril 20 mg tablet take 1 tablet by oral route  every day    05/03/2015 morphine 15 mg immediate release tablet take 1 tablet by oral route 2 times every day as needed    10/07/2014 Neurontin 100 mg capsule Take 1 capsule TID x 3 days, then 2 capsules TID x 3 days, then 3 capsules TID     omeprazole 20 mg capsule,delayed release take 1 capsule by oral route  every day 30 minutes to 1 hour before a meal    05/03/2015 tizanidine 4 mg tablet Take 1 po TID prn spasm     voltaren 75mg  ORAL      Xanax 0.5 mg tablet take 1 tablet by oral route 3 times every day    09/08/2013 z-pak  ORAL Take as directed     Zocor 20 mg tablet take 1 tablet by oral route  every day in the evening       ALLERGIES: Ingredient Reaction Medication Name Comment  OXYCODONE TEREPHTHALATE Rash Percodan   ASPIRIN Rash Percodan   OXYCODONE HCL Rash Percodan    Reviewed, no changes.    Vitals Date Temp F BP Pulse Ht In Wt Lb BMI BSA Pain Score  09/08/2015  139/92 92 67 162 25.37  4/10     PHYSICAL EXAM General Level of Distress: no acute distress Overall Appearance: normal  Head and Face  Right Left  Fundoscopic Exam:  normal normal    Cardiovascular Cardiac: regular rate and rhythm without murmur  Right Left  Carotid Pulses: normal normal  Respiratory Lungs: clear to auscultation  Neurological Orientation: normal Recent and  Remote Memory: normal Attention Span and Concentration:   normal Language: normal Fund of Knowledge: normal  Right Left Sensation: normal normal Upper Extremity Coordination: normal normal  Lower Extremity Coordination: normal normal  Musculoskeletal Gait and Station: normal  Right Left Upper Extremity Muscle Strength: normal normal Lower Extremity Muscle Strength: normal normal Upper Extremity Muscle Tone:  normal normal Lower Extremity Muscle Tone: normal normal  Motor Strength Upper and lower extremity motor strength was tested in the clinically pertinent muscles.     Deep Tendon Reflexes  Right Left Biceps: normal normal Triceps: normal normal Brachiloradialis: normal normal Patellar: normal normal Achilles: normal normal  Cranial Nerves II. Optic Nerve/Visual Fields: normal III. Oculomotor: normal IV. Trochlear:  normal V. Trigeminal: normal VI. Abducens: normal VII. Facial: normal VIII. Acoustic/Vestibular: normal IX. Glossopharyngeal: normal X. Vagus: normal XI. Spinal Accessory: normal XII. Hypoglossal: normal  Motor and other Tests Lhermittes: negative Rhomberg: negative Pronator drift: absent     Right Left Hoffman's: normal normal Clonus: normal normal Babinski: normal normal      IMPRESSION The patient is managing his pain well with medication and is improving well since before surgery.   Completed Orders (this encounter) Order Details Reason Side Interpretation Result Initial Treatment Date Region  Hypertension education Follow up with primary care physician.        Lifestyle education regarding diet Encouraged to eat a well balanced diet and follow up with primary care physician.         Assessment/Plan # Detail Type Description   1. Assessment Low back pain (M54.5).       2. Assessment Scoliosis (and kyphoscoliosis), idiopathic (M41.20).       3. Assessment Spondylolisthesis, lumbar region (M43.16).       4. Assessment  Radiculopathy, lumbar region (M54.16).       5. Assessment Essential (primary) hypertension (I10).       6. Assessment Body mass index (BMI) 25.0-25.9, adult [id number]).   Plan Orders Today's instructions / counseling include(s) Lifestyle education regarding diet.         Pain Assessment/Treatment Pain Scale: 4/10. Method: Numeric Pain Intensity Scale. Location: back. Onset: 11/21/2013. Duration: varies. Quality: discomforting. Pain Assessment/Treatment follow-up plan of care: Patient is taking medications as prescribed..  Follow up PRN  Orders: Instruction(s)/Education: Assessment Instruction  I10 Hypertension education  Z68.25 Lifestyle education regarding diet             Provider:  Marchia Meiers. Vertell Limber MD  09/08/2015 11:21 AM Dictation edited by: Derek Mound    CC Providers: [place],  [place]  [id number]-   Juanita Craver [facility] [address] [place], [place] [address]-              Electronically signed by Marchia Meiers. Vertell Limber MD on 09/08/2015 11:21 AM

## 2016-01-21 NOTE — Progress Notes (Signed)
Patient arrived to room from PACU alert at this time. Safety precautions and orders reviewed with patient ICS encourage. SCDs applied. Pt resting quietly. No other distress noted. Will continue to monitor.   Ave Filter, RN

## 2016-01-21 NOTE — Transfer of Care (Signed)
Immediate Anesthesia Transfer of Care Note  Patient: Aaron Black  Procedure(s) Performed: Procedure(s) with comments:  Lumbar Four-Five  Posterior Exploration and Revision of Posterior Fusion (N/A) -  Lumbar Four-Five Posterior Exploration and Revision of Posterior Fusion  Patient Location: PACU  Anesthesia Type:General  Level of Consciousness: awake, alert  and oriented  Airway & Oxygen Therapy: Patient Spontanous Breathing and Patient connected to nasal cannula oxygen  Post-op Assessment: Report given to RN and Post -op Vital signs reviewed and stable  Post vital signs: Reviewed and stable  Last Vitals:  Filed Vitals:   01/21/16 0644  BP: 135/96  Pulse: 72  Temp: 36.6 C  Resp: 20    Last Pain:  Filed Vitals:   01/21/16 0647  PainSc: 8       Patients Stated Pain Goal: 2 ([id number])  Complications: No apparent anesthesia complications

## 2016-01-21 NOTE — Anesthesia Procedure Notes (Signed)
Procedure Name: Intubation Date/Time: 01/21/2016 9:45 AM Performed by: Eligha Bridegroom Pre-anesthesia Checklist: Emergency Drugs available, Patient identified, Suction available, Patient being monitored and Timeout performed Patient Re-evaluated:Patient Re-evaluated prior to inductionOxygen Delivery Method: Circle system utilized Preoxygenation: Pre-oxygenation with 100% oxygen Intubation Type: IV induction Laryngoscope Size: Mac and 4 Grade View: Grade I Tube type: Oral Airway Equipment and Method: Stylet Placement Confirmation: ETT inserted through vocal cords under direct vision,  positive ETCO2 and breath sounds checked- equal and bilateral Secured at: 22 cm Tube secured with: Tape Dental Injury: Teeth and Oropharynx as per pre-operative assessment

## 2016-01-21 NOTE — Op Note (Signed)
01/21/2016  1:18 PM  PATIENT:  Aaron Black  60 y.o. male  PRE-OPERATIVE DIAGNOSIS:  Pseudoarthrosis of lumbar spine; Lumbar stenosis; Disc displacement of lumbar spine L 4 5 level, lumbar radiculopathy  POST-OPERATIVE DIAGNOSIS:  Pseudoarthrosis of lumbar spine; Lumbar stenosis; Disc displacement of lumbar spine L 4 5 level, lumbar radiculopathy   PROCEDURE:  Procedure(s) with comments:  Lumbar Four-Five  Posterior Exploration and Revision of Posterior Fusion (N/A) -  Lumbar Four-Five Posterior Exploration and Revision of Posterior Fusion with complete facetectomy with revision of anterolateral cage through posterior approach with revision of posterior hardware and posterolateral arthrodesis  SURGEON:  Surgeon(s) and Role:    * Erline Levine, MD - Primary    * Kary Kos, MD - Assisting  PHYSICIAN ASSISTANT:   ASSISTANTS: Pote[place], RN   ANESTHESIA:   general  EBL:  Total I/O In: 2700 [I.V.:2700] Out: [id number] [Urine:560; Blood:575]  BLOOD ADMINISTERED:none  DRAINS: none   LOCAL MEDICATIONS USED:  MARCAINE     SPECIMEN:  No Specimen  DISPOSITION OF SPECIMEN:  N/A  COUNTS:  YES  TOURNIQUET:  * No tourniquets in log *  DICTATION: Patient is 60 year old man with lumbar scoliosis and previous fusion L 1-5 levels with pseudoarthrosis at  L 45 with posterolateral migration of L 45 XLIF cage with Left L 4 nerve root compression in extraforaminal space.  It was elected to revise cage and posterior hardware.     Procedure: Patient was placed in a prone position on the [place] table after smooth and uncomplicated induction of general endotracheal anesthesia. His low back was prepped and draped in usual sterile fashion with betadine scrub and DuraPrep. Area of incision was infiltrated with local lidocaine. Incision was made to the lumbodorsal fascia was incised and exposure was performed of the L 3 - L 5 spinous processes laminae facet joint and transverse processes. Previous hardware was  exposed.   The previously placed bone screws were exposed and the rods were cut and loosened L 5 screws were removed. A total hemi- laminectomy and completed facetectomy of L 4 -5 on the left was performed  With wide decompression of the thecal sac and left L 4 nerve root.  A thorough discectomy with removal annulus and disc material was performed to expose the XLIF cage.  This was then tamped away from the nerve root and the L 4 nerve root was decompressed.  X ray demonstrated the cage was successfully moved out of the neural foramen and away from the nerve root. The posterolateral region was extensively decorticated on the right the previously placed L 5 screws were replaced (8.5 x 45 on the right and 6.5 x 50 on the left).  Connectors were affixed to the rods on each side and the construct was placed in compression.  5.5 mm rods were placed and connected through offset connectors.  Final X ray demonstrated well positioned screws and rods and implants.   The posterolateral region was packed with 20 cc bone graft on the right and extra-small BMP along with autograft. The wound was irrigated. Fascia was closed with 1 Vicryl sutures skin edges were reapproximated 2 and 3-0 Vicryl sutures. Subcutaneous tissues were infiltrated with long-acting Marcaine.  The wound was dressed with Dermabond and  an occlusive dressing the patient was extubated in the operating room and taken to recovery in stable satisfactory condition she tolerated traction well counts were correct at the end of the case.   PLAN OF CARE: Admit to inpatient  PATIENT DISPOSITION:  PACU - hemodynamically stable.   Delay start of Pharmacological VTE agent (>24hrs) due to surgical blood loss or risk of bleeding: yes

## 2016-01-22 MED ORDER — FAMOTIDINE 20 MG PO TABS
20.0000 mg | ORAL_TABLET | Freq: Two times a day (BID) | ORAL | Status: DC
  Administered 2016-01-22 – 2016-01-23 (×2): 20 mg via ORAL
  Filled 2016-01-22 (×2): qty 1

## 2016-01-22 NOTE — Evaluation (Signed)
Physical Therapy Evaluation Patient Details Name: Aaron Black MRN: [id number] DOB: 07-Jan-1956 Today's Date: 01/22/2016   History of Present Illness  Patient is a 60 yo male s/p Lumbar Four-Five Posterior Exploration and Revision of Posterior Fusion  Clinical Impression  Patient demonstrates deficits in functional mobility as indicated below. Will need continued skilled PT to address deficits and maximize function. Will see as indicated and progress as tolerated.     Follow Up Recommendations No PT follow up;Supervision/Assistance - 24 hour    Equipment Recommendations  3in1 (PT)    Recommendations for Other Services       Precautions / Restrictions Precautions Precautions: Back Precaution Booklet Issued: Yes (comment) Precaution Comments: provided written hand out and verbally reviewed with patient Required Braces or Orthoses: Spinal Brace Spinal Brace: Lumbar corset Restrictions Weight Bearing Restrictions: No      Mobility  Bed Mobility Overal bed mobility: Needs Assistance Bed Mobility: Rolling;Sidelying to Sit Rolling: Min guard Sidelying to sit: Min guard       General bed mobility comments: Min guard for safety and stability  Transfers Overall transfer level: Needs assistance Equipment used: 1 person hand held assist Transfers: Sit to/from Stand Sit to Stand: Min assist         General transfer comment: VCs for hand placement, min assist for elevation to upright. Cues for safety  Ambulation/Gait Ambulation/Gait assistance: Supervision Ambulation Distance (Feet): 380 Feet Assistive device: Rolling walker (2 wheeled) Gait Pattern/deviations: Step-through pattern;Decreased stride length;Drifts right/left Gait velocity: decreased Gait velocity interpretation: Below normal speed for age/gender General Gait Details: slow gait speed initially, improved with cues, supervision for safety  Stairs            Wheelchair Mobility    Modified Rankin  (Stroke Patients Only)       Balance                                             Pertinent Vitals/Pain Pain Assessment: Faces Faces Pain Scale: Hurts whole lot Pain Location: back Pain Descriptors / Indicators: Aching;Grimacing;Guarding;Moaning Pain Intervention(s): Monitored during session;Repositioned    Home Living Family/patient expects to be discharged to:: Private residence Living Arrangements: Spouse/significant other Available Help at Discharge: Family;Available 24 hours/day Type of Home: House Home Access: Stairs to enter Entrance Stairs-Rails: None Entrance Stairs-Number of Steps: 1 Home Layout: One level Home Equipment: Environmental consultant - 2 wheels      Prior Function Level of Independence: Independent               Hand Dominance   Dominant Hand: Right    Extremity/Trunk Assessment   Upper Extremity Assessment: Overall WFL for tasks assessed           Lower Extremity Assessment: Defer to PT evaluation      Cervical / Trunk Assessment: Other exceptions  Communication   Communication: No difficulties  Cognition Arousal/Alertness: Awake/alert Behavior During Therapy: Impulsive Overall Cognitive Status: Within Functional Limits for tasks assessed                      General Comments      Exercises        Assessment/Plan    PT Assessment Patient needs continued PT services  PT Diagnosis Difficulty walking;Abnormality of gait;Acute pain   PT Problem List Decreased strength;Decreased activity tolerance;Decreased balance;Decreased mobility;Decreased cognition;Decreased knowledge of use of  DME;Decreased safety awareness;Decreased knowledge of precautions;Pain  PT Treatment Interventions DME instruction;Gait training;Stair training;Functional mobility training;Therapeutic activities;Therapeutic exercise;Balance training;Patient/family education   PT Goals (Current goals can be found in the Care Plan section) Acute Rehab PT  Goals Patient Stated Goal: to not be in pain PT Goal Formulation: With patient Time For Goal Achievement: 02/05/16 Potential to Achieve Goals: Good    Frequency Min 5X/week   Barriers to discharge        Co-evaluation               End of Session Equipment Utilized During Treatment: Gait belt Activity Tolerance: Patient limited by pain Patient left: in chair;with call bell/phone within reach;with chair alarm set;with family/visitor present Nurse Communication: Mobility status;Precautions         Time: 1020-1045 PT Time Calculation (min) (ACUTE ONLY): 25 min   Charges:   PT Evaluation $PT Eval Moderate Complexity: 1 Procedure     PT G CodesDuncan Dull January 30, 2016, 11:35 AM Alben Deeds, PT DPT  [phone]

## 2016-01-22 NOTE — Progress Notes (Signed)
Patient ID: Aaron Black, male   DOB: 1955-12-05, 60 y.o.   MRN: [id number] BP 104/59 mmHg  Pulse 95  Temp(Src) 98.4 F (36.9 C) (Oral)  Resp 16  Ht 5\' 7"  (1.702 m)  Wt 73.936 kg (163 lb)  BMI 25.52 kg/m2  SpO2 95% Alert and oriented x 4, speech is clear and fluent Moving lower extremities well Wound is clean, dry, no signs of infection Significant pain in peri incisional area.

## 2016-01-22 NOTE — Evaluation (Signed)
Occupational Therapy Evaluation Patient Details Name: Aaron Black MRN: [id number] DOB: [birth year] Today's Date: 01/22/2016    History of Present Illness Patient is a 60 yo male s/p Lumbar Four-Five Posterior Exploration and Revision of Posterior Fusion   Clinical Impression   Pt reports he was independent with ADLs PTA. Currently pt is overall min guard-min assist for ADLs. Began back, safety, and ADL education with pt and wife; they recall a lot of info from previous sxs. Pt planning to d/c home with 24/7 from his wife. Pt would benefit from continued skilled OT to address established goals.    Follow Up Recommendations  No OT follow up;Supervision - Intermittent    Equipment Recommendations  3 in 1 bedside comode    Recommendations for Other Services       Precautions / Restrictions Precautions Precautions: Back Precaution Booklet Issued: Yes (comment) Precaution Comments: provided written hand out and verbally reviewed with patient Required Braces or Orthoses: Spinal Brace Spinal Brace: Lumbar corset Restrictions Weight Bearing Restrictions: No      Mobility Bed Mobility Overal bed mobility: Needs Assistance Bed Mobility: Rolling;Sidelying to Sit Rolling: Min guard Sidelying to sit: Min guard       General bed mobility comments: Min guard for safety and stability  Transfers Overall transfer level: Needs assistance Equipment used: 1 person hand held assist Transfers: Sit to/from Stand Sit to Stand: Min assist         General transfer comment: VCs for hand placement, min assist for elevation to upright. Cues for safety    Balance Overall balance assessment: Needs assistance Sitting-balance support: No upper extremity supported;Feet supported Sitting balance-Leahy Scale: Fair     Standing balance support: Bilateral upper extremity supported Standing balance-Leahy Scale: Poor Standing balance comment: RW for support                           ADL Overall ADL's : Needs assistance/impaired Eating/Feeding: Independent;Sitting   Grooming: Min guard;Standing   Upper Body Bathing: Min guard;Sitting   Lower Body Bathing: Minimal assistance;Sit to/from stand   Upper Body Dressing : Min guard;Sitting   Lower Body Dressing: Minimal assistance;Sit to/from stand   Toilet Transfer: Minimal assistance;Ambulation;BSC;RW Toilet Transfer Details (indicate cue type and reason): Simulated by sit to stand from [place] and Hygiene: Minimal assistance;Sit to/from stand       Functional mobility during ADLs: Min guard;Rolling walker General ADL Comments: Educated pt and wife on maintaining precautions during functional activities, log roll technique for bed mobility.     Vision     Perception     Praxis      Pertinent Vitals/Pain Pain Assessment: Faces Faces Pain Scale: Hurts whole lot Pain Location: back Pain Descriptors / Indicators: Aching;Grimacing;Guarding;Moaning Pain Intervention(s): Monitored during session;Repositioned     Hand Dominance Right   Extremity/Trunk Assessment Upper Extremity Assessment Upper Extremity Assessment: Overall WFL for tasks assessed   Lower Extremity Assessment Lower Extremity Assessment: Defer to PT evaluation   Cervical / Trunk Assessment Cervical / Trunk Assessment: Other exceptions Cervical / Trunk Exceptions: s/p lumbar sx, hx of multiple back sx   Communication Communication Communication: No difficulties   Cognition Arousal/Alertness: Awake/alert Behavior During Therapy: Impulsive Overall Cognitive Status: Within Functional Limits for tasks assessed                     General Comments       Exercises  Shoulder Instructions      Home Living Family/patient expects to be discharged to:: Private residence Living Arrangements: Spouse/significant other Available Help at Discharge: Family;Available 24 hours/day Type of Home:  House Home Access: Stairs to enter [employer] of Steps: 1 Entrance Stairs-Rails: None Home Layout: One level     Bathroom Shower/Tub: Occupational psychologist: Standard     Home Equipment: Environmental consultant - 2 wheels          Prior Functioning/Environment Level of Independence: Independent             OT Diagnosis: Generalized weakness;Acute pain   OT Problem List: Decreased strength;Decreased activity tolerance;Impaired balance (sitting and/or standing);Decreased safety awareness;Decreased knowledge of precautions;Decreased knowledge of use of DME or AE;Pain   OT Treatment/Interventions: Self-care/ADL training;DME and/or AE instruction;Energy conservation;Therapeutic activities;Patient/family education;Balance training    OT Goals(Current goals can be found in the care plan section) Acute Rehab OT Goals Patient Stated Goal: to not be in pain OT Goal Formulation: With patient/family Time For Goal Achievement: 02/05/16 Potential to Achieve Goals: Good ADL Goals Pt Will Perform Grooming: with modified independence;standing Pt Will Transfer to Toilet: with modified independence;ambulating;regular height toilet Pt Will Perform Toileting - Clothing Manipulation and hygiene: with modified independence;sit to/from stand Pt Will Perform Tub/Shower Transfer: Shower transfer;with supervision;ambulating;3 in 1;rolling walker Additional ADL Goal #1: Pt will independently verbally recall 3/3 back precautions and maintain throughout ADL. Additional ADL Goal #2: Pt will don/doff back brace with set up as precursor for ADLs and functional mobility.  OT Frequency: Min 2X/week   Barriers to D/C:            Co-evaluation              End of Session Equipment Utilized During Treatment: Gait belt;Rolling walker Nurse Communication: Mobility status  Activity Tolerance: Patient tolerated treatment well Patient left: in chair;with call bell/phone within reach;with  chair alarm set;with family/visitor present   Time: 1020-1045 OT Time Calculation (min): 25 min Charges:  OT General Charges $OT Visit: 1 Procedure OT Evaluation $OT Eval Moderate Complexity: 1 Procedure G-Codes:     Binnie Kand M.S., OTR/L Pager: [phone]  01/22/2016, 11:46 AM

## 2016-01-22 NOTE — Progress Notes (Signed)
Orthopedic Tech Progress Note Patient Details:  Aaron Black 1956/02/18 [id number]  Patient ID: [name], male   DOB: January 23, 1956, 59 y.o.   MRN: [id number]   [name] 01/22/2016, 12:14 PMCalled [employer] for LSO brace.

## 2016-01-23 MED ORDER — HYDROMORPHONE HCL 2 MG PO TABS: 2.0000 mg | ORAL_TABLET | ORAL | Status: DC | PRN

## 2016-01-23 MED ORDER — CYCLOBENZAPRINE HCL 10 MG PO TABS: 10.0000 mg | ORAL_TABLET | Freq: Two times a day (BID) | ORAL | Status: DC | PRN

## 2016-01-23 NOTE — Progress Notes (Signed)
Physical Therapy Treatment Patient Details Name: Aaron Black MRN: [id number] DOB: [birth year] Today's Date: 01/23/2016    History of Present Illness Patient is a 60 yo male s/p Lumbar Four-Five Posterior Exploration and Revision of Posterior Fusion    PT Comments    Pt progressing towards physical therapy goals. Pain was limiting factor during session due to brace pressing on incision, however pt still mobilized well with gross supervision for safety. Pt completed stair training with no difficulty. Reviewed precautions, walking program, and general safety for home. Feel pt is ready for d/c home from a mobility standpoint. Will continue to follow until d/c.  Follow Up Recommendations  Outpatient PT;Supervision for mobility/OOB     Equipment Recommendations  3in1 (PT)    Recommendations for Other Services       Precautions / Restrictions Precautions Precautions: Back Precaution Comments: Pt was cued for precautions during functional mobility and pt was able to recall 3/3 precautions verbally.  Required Braces or Orthoses: Spinal Brace Spinal Brace: Lumbar corset;Applied in sitting position Restrictions Weight Bearing Restrictions: No    Mobility  Bed Mobility               General bed mobility comments: Pt sitting up in recliner upon PT arrival.   Transfers Overall transfer level: Needs assistance Equipment used: None Transfers: Sit to/from Stand Sit to Stand: Supervision         General transfer comment: Pt was able to power-up to full standing position without physical assist. No AD required and no unsteadiness noted.   Ambulation/Gait Ambulation/Gait assistance: Supervision Ambulation Distance (Feet): 300 Feet Assistive device: Rolling walker (2 wheeled);Straight cane Gait Pattern/deviations: Step-through pattern;Decreased stride length;Trunk flexed Gait velocity: Decreased Gait velocity interpretation: Below normal speed for age/gender General Gait  Details: Pt moving well with the RW. VC's for improved posture. Pt given a SPC at end of gait training and pt able to maintain better posture without cues. Supervision for safety with both the RW and SPC.    Stairs Stairs: Yes Stairs assistance: Supervision Stair Management: One rail Right;Alternating pattern;Step to pattern;Forwards Number of Stairs: 10 (5 x2) General stair comments: VC's for general sequencing and safety. Pt practiced holding to R rail loosely to simulate home environment.   Wheelchair Mobility    Modified Rankin (Stroke Patients Only)       Balance Overall balance assessment: Needs assistance Sitting-balance support: No upper extremity supported;Feet supported Sitting balance-Leahy Scale: Fair     Standing balance support: No upper extremity supported;During functional activity Standing balance-Leahy Scale: Fair Standing balance comment: Was able to perform light dynamic activity without UE support.                     Cognition Arousal/Alertness: Awake/alert Behavior During Therapy: WFL for tasks assessed/performed Overall Cognitive Status: Within Functional Limits for tasks assessed                      Exercises      General Comments        Pertinent Vitals/Pain Pain Assessment: Faces Faces Pain Scale: Hurts little more Pain Location: Incision site, IV site. Adjusted brace positioning for better comfort around incision. Pain Descriptors / Indicators: Operative site guarding;Sore Pain Intervention(s): Limited activity within patient's tolerance;Monitored during session;Repositioned    Home Living                      Prior Function  PT Goals (current goals can now be found in the care plan section) Acute Rehab PT Goals Patient Stated Goal: Decrease pain, home this morning PT Goal Formulation: With patient/family Time For Goal Achievement: 02/05/16 Potential to Achieve Goals: Good Progress towards PT  goals: Progressing toward goals    Frequency  Min 5X/week    PT Plan Current plan remains appropriate    Co-evaluation             End of Session Equipment Utilized During Treatment: Gait belt Activity Tolerance: Patient tolerated treatment well Patient left: in chair;with call bell/phone within reach;with chair alarm set;with family/visitor present     Time: [id number] PT Time Calculation (min) (ACUTE ONLY): 21 min  Charges:  $Gait Training: 8-22 mins                    G Codes:      Rolinda Roan 01-24-2016, 8:27 AM   Rolinda Roan, PT, DPT Acute Rehabilitation Services Pager: [phone]

## 2016-01-23 NOTE — Care Management Note (Signed)
Case Management Note  Patient Details  Name: Aaron Black MRN: [id number] Date of Birth: 29-Sep-1955  Subjective/Objective:                  Lumbar Four-Five Posterior Exploration and Revision of Posterior Fusion (N/A) Action/Plan: Discharge planning Expected Discharge Date:  01/23/16               Expected Discharge Plan:  [place]  In-House Referral:     Discharge planning Services  CM Consult  Post Acute Care Choice:  Home Health Choice offered to:  Patient  DME Arranged:  3-N-1 DME Agency:  [place]:  PT [facility] Agency:  [place]  Status of Service:  Completed, signed off  If discussed at [place] of Stay Meetings, dates discussed:    Additional Comments: CM spoke with pt to offer choice of home health agency. [name] to render HHPT.  Referral called to [facility] rep, Tiffany.  CM called [facility] DME rep, Germaine to please deliver the 3n1 to room so pt can discharge.  No other CM needs were communicated. Dellie Catholic, RN 01/23/2016, 11:40 AM

## 2016-01-23 NOTE — Discharge Instructions (Signed)
Spinal Fusion, Care After °Refer to this sheet in the next few weeks. These instructions provide you with information on caring for yourself after your procedure. Your caregiver may also give you more specific instructions. Your treatment has been planned according to current medical practices, but problems sometimes occur. Call your caregiver if you have any problems or questions after your procedure. °HOME CARE INSTRUCTIONS  °· Take whatever pain medicine has been prescribed by your caregiver. Do not take over-the-counter pain medicine unless directed otherwise by your caregiver. °· Do not drive if you are taking narcotic pain medicines. °· Change your bandage (dressing) if necessary or as directed by your caregiver. °· Do not get your surgical cut (incision) wet. After a few days you may take quick showers (rather than baths), but keep your incision clean and dry. Covering the incision with plastic wrap while you shower should keep your incision dry. A few weeks after surgery, once your incision has healed and your caregiver says it is okay, you can take baths or go swimming. °· If you have been prescribed medicine to prevent your blood from clotting, follow the directions carefully. °· Check the area around your incision often. Look for redness and swelling. Also, look for anything leaking from your wound. You can use a mirror or have a family member inspect your incision if it is in a place where it is difficult for you to see. °· Ask your caregiver what activities you should avoid and for how long. °· Walk as much as possible. °· Do not lift anything heavier than 10 pounds (4.5 kilograms) until your caregiver says it is safe. °· Do not twist or bend for a few weeks. Try not to pull on things. Avoid sitting for long periods of time. Change positions at least every hour. °· Ask your caregiver what kinds of exercise you should do to make your back stronger and when you should begin doing these exercises. °SEEK  IMMEDIATE MEDICAL CARE IF:  °· Pain suddenly becomes much worse. °· The incision area is red, swollen, bleeding, or leaking fluid. °· Your legs or feet become increasingly painful, numb, weak, or swollen. °· You have trouble controlling urination or bowel movements. °· You have trouble breathing. °· You have chest pain. °· You have a fever. °MAKE SURE YOU: °· Understand these instructions. °· Will watch your condition. °· Will get help right away if you are not doing well or get worse. °  °This information is not intended to replace advice given to you by your health care provider. Make sure you discuss any questions you have with your health care provider. °  °Document Released: 01/27/2005 Document Revised: 07/31/2014 Document Reviewed: 12/23/2014 °Elsevier Interactive Patient Education ©2016 [employer] ° °

## 2016-01-23 NOTE — Progress Notes (Signed)
RN discussed discharge instructions to patient including follow up appointments, medications, pain control, pt knows to keep dressing dry and on for 3 days. May remove after. Vocalized understanding of s/sx of infection. Dressing is clean, dry, intact. CM contacted to set up hh equipment and pt.

## 2016-01-23 NOTE — Progress Notes (Signed)
Patient ID: Aaron Black, male   DOB: Aug 21, 1955, 60 y.o.   MRN: [id number] No leg pain back pain well-controlled  Mobilized discharge home

## 2016-01-29 NOTE — Discharge Summary (Signed)
Physician Discharge Summary  Patient ID: Aaron Black MRN: [id number] DOB/AGE: 29-Jan-1956 60 y.o.  Admit date: 01/21/2016 Discharge date: 01/29/2016  Admission Diagnoses:Lumbar pseudoarthrosis with cage migration and lumbar radiculopathy L 45 level  Discharge Diagnoses: Same Active Problems:   Pseudoarthrosis of lumbar spine   Discharged Condition: Improved  Hospital Course: Patient was admitted on 01/21/16 for revision of posterior arthrodesis, including decompression, repositioning of XLIF cage, revision of posterior hardware and posterolateral arthrodesis.  He did well with this surgery and had relief of what, preoperatively, was an intractable left L 4 radiculopathy.  He was mobilized and did well and was discharged home on POD 2.  Consults: None  Significant Diagnostic Studies: None  Treatments: Surgery: revision of posterior arthrodesis, including decompression, repositioning of XLIF cage, revision of posterior hardware and posterolateral arthrodesis  Discharge Exam: Blood pressure 110/69, pulse 90, temperature 99.1 F (37.3 C), temperature source Oral, resp. rate 18, height 5\' 7"  (1.702 m), weight 73.936 kg (163 lb), SpO2 91 %. Wound CDI.  Strength 5/5.  Doing well.    Disposition: Home  Discharge Instructions    DME Bedside commode    Complete by:  As directed      Face-to-face encounter (required for Medicare/Medicaid patients)    Complete by:  As directed   I CRAM,GARY P certify that this patient is under my care and that I, or a nurse practitioner or physician's assistant working with me, had a face-to-face encounter that meets the physician face-to-face encounter requirements with this patient on 01/23/2016. The encounter with the patient was in whole, or in part for the following medical condition(s) which is the primary reason for home health care (List medical condition): Spinal stenosis pseudoarthrosis  The encounter with the patient was in whole, or in part, for the  following medical condition, which is the primary reason for home health care:  Lumbar stenosis and pseudoarthrosis  I certify that, based on my findings, the following services are medically necessary home health services:  Physical therapy  Reason for Medically Necessary Home Health Services:  Therapy- Instruction on Safe use of Assistive Devices for ADLs  My clinical findings support the need for the above services:  Pain interferes with ambulation/mobility  Further, I certify that my clinical findings support that this patient is homebound due to:  Pain interferes with ambulation/mobility     Home Health    Complete by:  As directed   To provide the following care/treatments:  PT            Medication List    TAKE these medications        allopurinol 100 MG tablet  Commonly known as:  ZYLOPRIM  Take 1 tablet (100 mg total) by mouth daily.     ALPRAZolam 0.5 MG tablet  Commonly known as:  XANAX  TAKE 1 TABLET BY MOUTH 3 TIMES A DAY     colchicine 0.6 MG tablet  Take 0.6 mg by mouth daily as needed (GOUT).     [facility] ALLERGY 25 mg capsule  Generic drug:  diphenhydrAMINE  TAKE 2 CAPSULE(S) EVERY 4 HOURS BY MOUTH AS NEEDED.     cyclobenzaprine 10 MG tablet  Commonly known as:  FLEXERIL  Take 10 mg by mouth 2 (two) times daily as needed for muscle spasms.     cyclobenzaprine 10 MG tablet  Commonly known as:  FLEXERIL  Take 1 tablet (10 mg total) by mouth 2 (two) times daily as needed for muscle spasms.  diltiazem 240 MG 24 hr capsule  Commonly known as:  CARDIZEM CD  Take 1 capsule (240 mg total) by mouth daily.     HYDROcodone-acetaminophen 5-325 MG tablet  Commonly known as:  NORCO/VICODIN  Take 1 tablet by mouth every 6 (six) hours as needed for moderate pain.     HYDROmorphone 2 MG tablet  Commonly known as:  DILAUDID  Take 2-4 mg by mouth every 6 (six) hours as needed.     HYDROmorphone 2 MG tablet  Commonly known as:  DILAUDID  Take 1-2 tablets (2-4 mg  total) by mouth every 3 (three) hours as needed for moderate pain or severe pain.     losartan 100 MG tablet  Commonly known as:  COZAAR  Take 1 tablet (100 mg total) by mouth daily.     omeprazole 20 MG tablet  Commonly known as:  PRILOSEC OTC  Take 2 tablets (40 mg total) by mouth daily.     sertraline 50 MG tablet  Commonly known as:  ZOLOFT  Take 1 tablet (50 mg total) by mouth daily.     vitamin B-12 1000 MCG tablet  Commonly known as:  CYANOCOBALAMIN  Take 1,000 mcg by mouth daily.           Follow-up Information    Follow up with [facility].   Why:  home health physical therapy and 3n1 (over the commode seat)   Contact information:   [place] [id number] [phone]       Signed: Peggyann Shoals, MD 01/29/2016, 11:22 AM

## 2016-02-14 ENCOUNTER — Telehealth: Payer: Self-pay | Admitting: Internal Medicine

## 2016-02-14 MED ORDER — COLCHICINE 0.6 MG PO TABS: 0.6000 mg | ORAL_TABLET | Freq: Every day | ORAL | 0 refills | Status: DC | PRN

## 2016-02-14 NOTE — Telephone Encounter (Signed)
Rx sent to pharmacy   

## 2016-02-14 NOTE — Telephone Encounter (Signed)
Pt had back surgery 3 weeks ago and has now a   flare of with gout in his left toe. Would like to know if Dr Raliegh Ip will call in rx for  colchicine 0.6 MG tablet  Pt cannot get into office.   [facility] [address]

## 2016-02-14 NOTE — Telephone Encounter (Signed)
Colchicine  0.6 mg 1 twice a day when necessary acute gout  #30

## 2016-02-23 ENCOUNTER — Encounter: Payer: Self-pay | Admitting: Family Medicine

## 2016-02-23 ENCOUNTER — Ambulatory Visit (INDEPENDENT_AMBULATORY_CARE_PROVIDER_SITE_OTHER): Payer: Managed Care, Other (non HMO) | Admitting: Family Medicine

## 2016-02-23 VITALS — BP 138/86 | HR 94 | Temp 98.4°F | Ht 67.0 in | Wt 168.0 lb

## 2016-02-23 DIAGNOSIS — G5762 Lesion of plantar nerve, left lower limb: Secondary | ICD-10-CM

## 2016-02-23 MED ORDER — METHYLPREDNISOLONE 4 MG PO TBPK: ORAL_TABLET | ORAL | 0 refills | Status: DC

## 2016-02-23 NOTE — Progress Notes (Signed)
   Subjective:    Patient ID: Aaron Black, male    DOB: [birth year], 60 y.o.   MRN: [id number]  HPI Here for one week of pain and swelling in the left foot. No recent trauma. He has gout but this is different that his typical gout. He is taking Colchicine for it but this is not helping at all.    Review of Systems  Constitutional: Negative.   Musculoskeletal: Positive for arthralgias and joint swelling.       Objective:   Physical Exam  Constitutional: He appears well-developed and well-nourished.  Musculoskeletal:  He is swollen and tender on the left forefoot between the 2nd and 3rd toes. No erythema or warmth.           Assessment & Plan:  Morton's neuroma. Stay off the foot and keep it elevated. Try a Medrol dose pack. Recheck prn. Laurey Morale, MD

## 2016-02-23 NOTE — Progress Notes (Signed)
Pre visit review using our clinic review tool, if applicable. No additional management support is needed unless otherwise documented below in the visit note. 

## 2016-03-02 ENCOUNTER — Other Ambulatory Visit: Payer: Self-pay | Admitting: Internal Medicine

## 2016-03-03 NOTE — Telephone Encounter (Signed)
Okay to refill? 

## 2016-03-03 NOTE — Telephone Encounter (Signed)
Rx refill sent to pharmacy. 

## 2016-03-09 ENCOUNTER — Ambulatory Visit (INDEPENDENT_AMBULATORY_CARE_PROVIDER_SITE_OTHER): Payer: Managed Care, Other (non HMO) | Admitting: Podiatry

## 2016-03-09 ENCOUNTER — Encounter: Payer: Self-pay | Admitting: Podiatry

## 2016-03-09 ENCOUNTER — Ambulatory Visit (INDEPENDENT_AMBULATORY_CARE_PROVIDER_SITE_OTHER): Payer: Managed Care, Other (non HMO)

## 2016-03-09 VITALS — BP 133/93 | HR 109 | Resp 16 | Ht 67.0 in | Wt 165.0 lb

## 2016-03-09 DIAGNOSIS — M79672 Pain in left foot: Secondary | ICD-10-CM

## 2016-03-09 DIAGNOSIS — M779 Enthesopathy, unspecified: Secondary | ICD-10-CM

## 2016-03-09 MED ORDER — TRIAMCINOLONE ACETONIDE 10 MG/ML IJ SUSP
10.0000 mg | Freq: Once | INTRAMUSCULAR | Status: AC
  Administered 2016-03-09: 10 mg

## 2016-03-09 NOTE — Progress Notes (Signed)
   Subjective:    Patient ID: Aaron Black, male    DOB: 1956/03/08, 60 y.o.   MRN: [id number]  HPI Chief Complaint  Patient presents with  . Foot Pain    Left foot; interdigital 2nd & 3rd toes; pt stated, "Saw Dr. Noemi Chapel and he referred him over here; Dr. Sharlene Motts thought pt had Morton's Neuroma"      Review of Systems  Musculoskeletal: Positive for arthralgias, back pain and gait problem.  All other systems reviewed and are negative.      Objective:   Physical Exam        Assessment & Plan:

## 2016-03-10 ENCOUNTER — Ambulatory Visit: Payer: Managed Care, Other (non HMO) | Admitting: Podiatry

## 2016-03-10 NOTE — Progress Notes (Signed)
Subjective:     Patient ID: Aaron Black, male   DOB: 1955-09-23, 60 y.o.   MRN: [id number]  HPI patient presents stating that he's been getting a lot of pain around his second and third toe of his left foot that's been going on for several months and has failed to respond to shoe gear modifications anti-inflammatory and reduced activity   Review of Systems  All other systems reviewed and are negative.      Objective:   Physical Exam  Constitutional: He is oriented to person, place, and time.  Cardiovascular: Intact distal pulses.   Musculoskeletal: Normal range of motion.  Neurological: He is oriented to person, place, and time.  Skin: Skin is warm.  Nursing note and vitals reviewed.  neurovascular status intact muscle strength adequate range of motion within normal limits with patient found to have inflammatory changes with pain of the second and third metatarsophalangeal joint left with fluid buildup and slight separation of the adjacent digits. There is no dorsal excursion of the toes and they are moderately painful when pressed. Patient has good digital perfusion and is well oriented 3     Assessment:     Inflammatory capsulitis second and third metatarsophalangeal joints left that are very painful when palpated    Plan:     H&P x-rays performed and today I went ahead and injected the second and third MPJs after first doing proximal nerve block and aspiration with 1/4 mL deck some Kenalog and each joint. I did discuss prior to this the risk of digital movement and patient is aware of this  X-ray report indicates elongated second and third metatarsals

## 2016-03-19 ENCOUNTER — Other Ambulatory Visit: Payer: Self-pay | Admitting: Internal Medicine

## 2016-03-24 ENCOUNTER — Ambulatory Visit (INDEPENDENT_AMBULATORY_CARE_PROVIDER_SITE_OTHER): Payer: Managed Care, Other (non HMO) | Admitting: Podiatry

## 2016-03-24 ENCOUNTER — Encounter: Payer: Self-pay | Admitting: Podiatry

## 2016-03-24 DIAGNOSIS — M79672 Pain in left foot: Secondary | ICD-10-CM | POA: Diagnosis not present

## 2016-03-24 DIAGNOSIS — M779 Enthesopathy, unspecified: Secondary | ICD-10-CM | POA: Diagnosis not present

## 2016-03-24 NOTE — Progress Notes (Signed)
Subjective:     Patient ID: Aaron Black, male   DOB: [birth year], 60 y.o.   MRN: [id number]  HPI patient presents stating that his foot is feeling much better with occasional discomfort   Review of Systems     Objective:   Physical Exam Neurovascular status intact negative Homans sign noted with patient found to have well improve second third metatarsals left with diminished swelling    Assessment:     Improved capsulitis left foot    Plan:     Advised on physical therapy anti-inflammatories supportive shoes and if symptoms persist may need to consider other treatment

## 2016-04-06 ENCOUNTER — Other Ambulatory Visit: Payer: Self-pay | Admitting: Internal Medicine

## 2016-05-23 ENCOUNTER — Encounter: Payer: Self-pay | Admitting: Family Medicine

## 2016-05-23 ENCOUNTER — Ambulatory Visit (INDEPENDENT_AMBULATORY_CARE_PROVIDER_SITE_OTHER): Payer: Managed Care, Other (non HMO) | Admitting: Family Medicine

## 2016-05-23 VITALS — BP 146/94 | HR 96 | Temp 98.6°F | Ht 67.0 in | Wt 173.0 lb

## 2016-05-23 DIAGNOSIS — E785 Hyperlipidemia, unspecified: Secondary | ICD-10-CM | POA: Diagnosis not present

## 2016-05-23 DIAGNOSIS — T50905A Adverse effect of unspecified drugs, medicaments and biological substances, initial encounter: Secondary | ICD-10-CM | POA: Diagnosis not present

## 2016-05-23 DIAGNOSIS — I10 Essential (primary) hypertension: Secondary | ICD-10-CM

## 2016-05-23 MED ORDER — ROSUVASTATIN CALCIUM 5 MG PO TABS: 5.0000 mg | ORAL_TABLET | Freq: Every day | ORAL | 1 refills | Status: DC

## 2016-05-23 NOTE — Progress Notes (Signed)
HPI:  Aaron Black a pleasant 60 year old with past medical history significant for hypertension, hyperlipidemia and tobacco use here for an acute visit for a medication interaction issue. He reports that the insurance company sent him a letter telling him that his diltiazem and his simvastatin could not be taken together. He had taken these medications for a long time without any issues. He has now stopped the simvastatin. He really wants to be on cholesterol medication and wonders if there are other options. He does take colchicine, but uses a half dose of this and uses it rarely for gout flares, which he would reports are very seldom these days.His blood pressure is elevated on arrival today, but he reports he did not take any of his medicines and that he was stressed on the way here due to heavy traffic.  Denies any chest pain, cramps, cognitive dysfunction on the statin, dyspnea on exertion or headaches.   ROS: See pertinent positives and negatives per HPI.  Past Medical History:  Diagnosis Date  . Anemia   . Anxiety   . Arthritis   . B12 deficiency   . Cervical disc disease    a. 07/2003 ant cervical deomcpression and fusion C5-6/C6-7;  b. 09/2007 post cervical laminectomy C4-5 with scres and arthrodesis C4-C7.  Marland Kitchen Chest pain at rest 02/25/2013  . Chronic lower back pain   . Colon polyps   . Complication of anesthesia    " i WOKE UP DURING A COLONOSCOPY "- 2008  . Depression   . Diverticulosis of colon   . GERD (gastroesophageal reflux disease)   . Gout   . Hyperlipidemia   . Hypertension   . Pleurisy   . Shortness of breath dyspnea    with exertion- "I cant exercise now"  . Sleep apnea   . Tobacco abuse    a. 40 yrs, 1.5-3 ppd over that time.    Past Surgical History:  Procedure Laterality Date  . ANTERIOR LATERAL LUMBAR FUSION 4 LEVELS Right 06/05/2014   Procedure: ANTERIOR LATERAL LUMBAR FUSION  LUMBAR ONE TO LUMBAR FIVE with Percutaneous Pedicle Screws;  Surgeon:  Erline Levine, MD;  Location: [facility];  Service: Neurosurgery;  Laterality: Right;  . CARPAL TUNNEL RELEASE Right   . CERVICAL DISC SURGERY     X 2  . COLONOSCOPY    . ELBOW ARTHROSCOPY Right   . KNEE ARTHROSCOPY Right 08/2013   torn menicus  . KNEE ARTHROSCOPY Left 11/2013   chip cartlidge,torn menicus  . LUMBAR LAMINECTOMY/DECOMPRESSION MICRODISCECTOMY Right 07/11/2013   Procedure: LUMBAR LAMINECTOMY/DECOMPRESSION MICRODISCECTOMY 1 LEVEL;  Surgeon: Erline Levine, MD;  Location: [facility];  Service: Neurosurgery;  Laterality: Right;  Right L34 microdiskectomy  . LUMBAR PERCUTANEOUS PEDICLE SCREW 4 LEVEL N/A 06/05/2014   Procedure: LUMBAR PERCUTANEOUS PEDICLE SCREW 4 LEVEL;  Surgeon: Erline Levine, MD;  Location: [facility];  Service: Neurosurgery;  Laterality: N/A;  . NECK SURGERY  January 2005, March 2009   C-Spine  . ROTATOR CUFF REPAIR  August 2000, January 2002, July 2008, July 2009   2 left 2 right    Family History  Problem Relation Age of Onset  . Bone cancer Mother   . Colon polyps Mother   . Squamous cell carcinoma Mother     died @ 28  . Heart attack Mother   . Hypertension Father   . Colon polyps Father   . Diabetes Father     borderline  . Congestive Heart Failure Father  alive @ 73  . Heart failure Father   . Sudden death Brother     died @ 55  . Other Brother     died in [place] @ 12 - struck by drunk driver  . Other Brother     died in MVA @ 36 - struck by drunk driver  . Heart attack Brother   . Stroke Paternal Grandfather     Social History   Social History  . Marital status: Married    Spouse name: N/A  . Number of children: N/A  . Years of education: N/A   Social History Main Topics  . Smoking status: Heavy Tobacco Smoker    Packs/day: 0.50    Years: 40.00    Types: Cigarettes    Last attempt to quit: 02/24/2013  . Smokeless tobacco: Never Used     Comment: Currently smoking 1.5 ppd.  Has previously smoked up to 3 ppd and did so for  about 20 yrs.  . Alcohol use No     Comment: at least 3 beers/night.- 01/20/16  . Drug use: No  . Sexual activity: Not Asked   Other Topics Concern  . None   Social History Narrative   Lives in [place] with wife.  Does not work.  Previously read H2O meters for city of [place].     Current Outpatient Prescriptions:  .  allopurinol (ZYLOPRIM) 100 MG tablet, Take 1 tablet (100 mg total) by mouth daily., Disp: 90 tablet, Rfl: 1 .  ALPRAZolam (XANAX) 0.5 MG tablet, TAKE 1 TABLET 3 TIMES DAILY, Disp: 90 tablet, Rfl: 2 .  colchicine 0.6 MG tablet, Take 1 tablet (0.6 mg total) by mouth daily as needed (GOUT)., Disp: 30 tablet, Rfl: 0 .  [facility] ALLERGY 25 MG capsule, TAKE 2 CAPSULE(S) EVERY 4 HOURS BY MOUTH AS NEEDED., Disp: , Rfl: 0 .  cyclobenzaprine (FLEXERIL) 10 MG tablet, Take 1 tablet (10 mg total) by mouth 2 (two) times daily as needed for muscle spasms., Disp: 60 tablet, Rfl: 0 .  diltiazem (CARDIZEM CD) 240 MG 24 hr capsule, TAKE ONE CAPSULE BY MOUTH EVERY DAY, Disp: 90 capsule, Rfl: 3 .  HYDROcodone-acetaminophen (NORCO/VICODIN) 5-325 MG per tablet, Take 1 tablet by mouth every 6 (six) hours as needed for moderate pain., Disp: 70 tablet, Rfl: 0 .  losartan (COZAAR) 100 MG tablet, Take 1 tablet (100 mg total) by mouth daily., Disp: 90 tablet, Rfl: 3 .  omeprazole (PRILOSEC OTC) 20 MG tablet, Take 2 tablets (40 mg total) by mouth daily., Disp: , Rfl:  .  sertraline (ZOLOFT) 50 MG tablet, Take 1 tablet (50 mg total) by mouth daily., Disp: 90 tablet, Rfl: 3 .  vitamin B-12 (CYANOCOBALAMIN) 1000 MCG tablet, Take 1,000 mcg by mouth daily., Disp: , Rfl:  .  rosuvastatin (CRESTOR) 5 MG tablet, Take 1 tablet (5 mg total) by mouth daily., Disp: 90 tablet, Rfl: 1  EXAM:  Vitals:   05/23/16 0935 05/23/16 0943  BP: (!) 160/112 (!) 146/94  Pulse: 96   Temp: 98.6 F (37 C)     Body mass index is 27.1 kg/m.  GENERAL: vitals reviewed and listed above, alert, oriented, appears well hydrated and in no  acute distress  HEENT: atraumatic, conjunttiva clear, no obvious abnormalities on inspection of external nose and ears  NECK: no obvious masses on inspection  LUNGS: clear to auscultation bilaterally, no wheezes, rales or rhonchi, good air movement  CV: HRRR, no peripheral edema  MS: moves all extremities without  noticeable abnormality  PSYCH: pleasant and cooperative, no obvious depression or anxiety  ASSESSMENT AND PLAN:  Discussed the following assessment and plan:  Essential hypertension  Hyperlipidemia, unspecified hyperlipidemia type  Adverse drug interaction with prescription medication  -Discussed interactions, options and he decided to try Crestor low-dose and follow up with PCP in several months. Advise a healthy lifestyle as well. -His blood pressure was a little improved on recheck, but is still elevated - he feels fine and really does feel this is due to not taking his medications and traffic on the way here -advised nurse check in 1-2 weeks Discussed potential interactions of the colchicine with his statin and diltiazem -is aware already of this.  -Patient advised to return or notify a doctor immediately if symptoms worsen or persist or new concerns arise.  Patient Instructions  BEFORE YOU LEAVE: -follow up: 1) nurse visit with Butch Penny in 1-2 weeks to recheck BP - make sure to take blood pressure medications a few hours before appointment 3) follow up with PCP in 3 months     KIM, Jarrett Soho R., DO

## 2016-05-23 NOTE — Patient Instructions (Addendum)
BEFORE YOU LEAVE: -follow up: 1) nurse visit with Butch Penny in 1-2 weeks to recheck BP - make sure to take blood pressure medications a few hours before appointment 3) follow up with PCP in 3 months

## 2016-05-23 NOTE — Progress Notes (Signed)
Pre visit review using our clinic review tool, if applicable. No additional management support is needed unless otherwise documented below in the visit note. 

## 2016-05-25 ENCOUNTER — Other Ambulatory Visit: Payer: Self-pay | Admitting: Internal Medicine

## 2016-06-06 ENCOUNTER — Ambulatory Visit (INDEPENDENT_AMBULATORY_CARE_PROVIDER_SITE_OTHER): Payer: Managed Care, Other (non HMO) | Admitting: Internal Medicine

## 2016-06-06 ENCOUNTER — Encounter: Payer: Self-pay | Admitting: Internal Medicine

## 2016-06-06 VITALS — BP 152/92 | Ht 67.0 in | Wt 173.0 lb

## 2016-06-06 DIAGNOSIS — I10 Essential (primary) hypertension: Secondary | ICD-10-CM

## 2016-06-06 DIAGNOSIS — M1000 Idiopathic gout, unspecified site: Secondary | ICD-10-CM

## 2016-06-06 NOTE — Patient Instructions (Signed)
Limit your sodium (Salt) intake  Please check your blood pressure on a regular basis.  If it is consistently greater than 150/90, please make an office appointment.  Return for annual exam as scheduled

## 2016-06-06 NOTE — Progress Notes (Signed)
Subjective:    Patient ID: Aaron Black, male    DOB: [birth year], 58 y.o.   MRN: [id number]  HPI 60 year old patient who has a history of essential hypertension.  The patient was seen today for follow-up.  He also has history of gout which has been quite stable on allopurinol.  He has dyslipidemia, controlled with Crestor. The patient's blood pressure has been treated with losartan as well as diltiazem. No major concerns or complaints today.  He does have an annual exam scheduled in the near future.  Past Medical History:  Diagnosis Date  . Anemia   . Anxiety   . Arthritis   . B12 deficiency   . Cervical disc disease    a. 07/2003 ant cervical deomcpression and fusion C5-6/C6-7;  b. 09/2007 post cervical laminectomy C4-5 with scres and arthrodesis C4-C7.  Marland Kitchen Chest pain at rest 02/25/2013  . Chronic lower back pain   . Colon polyps   . Complication of anesthesia    " i WOKE UP DURING A COLONOSCOPY "- 2008  . Depression   . Diverticulosis of colon   . GERD (gastroesophageal reflux disease)   . Gout   . Hyperlipidemia   . Hypertension   . Pleurisy   . Shortness of breath dyspnea    with exertion- "I cant exercise now"  . Sleep apnea   . Tobacco abuse    a. 40 yrs, 1.5-3 ppd over that time.     Social History   Social History  . Marital status: Married    Spouse name: N/A  . Number of children: N/A  . Years of education: N/A   Occupational History  . Not on file.   Social History Main Topics  . Smoking status: Heavy Tobacco Smoker    Packs/day: 0.50    Years: 40.00    Types: Cigarettes    Last attempt to quit: 02/24/2013  . Smokeless tobacco: Never Used     Comment: Currently smoking 1.5 ppd.  Has previously smoked up to 3 ppd and did so for about 20 yrs.  . Alcohol use No     Comment: at least 3 beers/night.- 01/20/16  . Drug use: No  . Sexual activity: Not on file   Other Topics Concern  . Not on file   Social History Narrative   Lives in [place] with wife.   Does not work.  Previously read H2O meters for city [place].    Past Surgical History:  Procedure Laterality Date  . ANTERIOR LATERAL LUMBAR FUSION 4 LEVELS Right 06/05/2014   Procedure: ANTERIOR LATERAL LUMBAR FUSION  LUMBAR ONE TO LUMBAR FIVE with Percutaneous Pedicle Screws;  Surgeon: Erline Levine, MD;  Location: [facility];  Service: Neurosurgery;  Laterality: Right;  . CARPAL TUNNEL RELEASE Right   . CERVICAL DISC SURGERY     X 2  . COLONOSCOPY    . ELBOW ARTHROSCOPY Right   . KNEE ARTHROSCOPY Right 08/2013   torn menicus  . KNEE ARTHROSCOPY Left 11/2013   chip cartlidge,torn menicus  . LUMBAR LAMINECTOMY/DECOMPRESSION MICRODISCECTOMY Right 07/11/2013   Procedure: LUMBAR LAMINECTOMY/DECOMPRESSION MICRODISCECTOMY 1 LEVEL;  Surgeon: Erline Levine, MD;  Location: [facility];  Service: Neurosurgery;  Laterality: Right;  Right L34 microdiskectomy  . LUMBAR PERCUTANEOUS PEDICLE SCREW 4 LEVEL N/A 06/05/2014   Procedure: LUMBAR PERCUTANEOUS PEDICLE SCREW 4 LEVEL;  Surgeon: Erline Levine, MD;  Location: [facility];  Service: Neurosurgery;  Laterality: N/A;  . NECK SURGERY  January 2005,  March 2009   C-Spine  . ROTATOR CUFF REPAIR  August 2000, January 2002, July 2008, July 2009   2 left 2 right    Family History  Problem Relation Age of Onset  . Bone cancer Mother   . Colon polyps Mother   . Squamous cell carcinoma Mother     died @ 58  . Heart attack Mother   . Hypertension Father   . Colon polyps Father   . Diabetes Father     borderline  . Congestive Heart Failure Father     alive @ 90  . Heart failure Father   . Sudden death Brother     died @ 76  . Other Brother     died in [place] @ 30 - struck by drunk driver  . Other Brother     died in MVA @ 89 - struck by drunk driver  . Heart attack Brother   . Stroke Paternal Grandfather     Allergies  Allergen Reactions  . Lisinopril Swelling    Swollen Lip  . Percodan [Oxycodone-Aspirin] Rash    Percodan caused rash---but  he can tolerate Percocet and aspirin on their own     Current Outpatient Prescriptions on File Prior to Visit  Medication Sig Dispense Refill  . allopurinol (ZYLOPRIM) 100 MG tablet Take 1 tablet (100 mg total) by mouth daily. 90 tablet 1  . ALPRAZolam (XANAX) 0.5 MG tablet TAKE 1 TABLET 3 TIMES DAILY 90 tablet 2  . colchicine 0.6 MG tablet Take 1 tablet (0.6 mg total) by mouth daily as needed (GOUT). 30 tablet 0  . cyclobenzaprine (FLEXERIL) 10 MG tablet Take 1 tablet (10 mg total) by mouth 2 (two) times daily as needed for muscle spasms. 60 tablet 0  . diltiazem (CARDIZEM CD) 240 MG 24 hr capsule TAKE ONE CAPSULE BY MOUTH EVERY DAY 90 capsule 3  . HYDROcodone-acetaminophen (NORCO/VICODIN) 5-325 MG per tablet Take 1 tablet by mouth every 6 (six) hours as needed for moderate pain. 70 tablet 0  . losartan (COZAAR) 100 MG tablet Take 1 tablet (100 mg total) by mouth daily. 90 tablet 3  . omeprazole (PRILOSEC OTC) 20 MG tablet Take 2 tablets (40 mg total) by mouth daily.    . rosuvastatin (CRESTOR) 5 MG tablet Take 1 tablet (5 mg total) by mouth daily. 90 tablet 1  . sertraline (ZOLOFT) 50 MG tablet TAKE 1 TABLET (50 MG TOTAL) BY MOUTH DAILY. 90 tablet 3  . vitamin B-12 (CYANOCOBALAMIN) 1000 MCG tablet Take 1,000 mcg by mouth daily.     No current facility-administered medications on file prior to visit.     BP (!) 152/92   Ht 5\' 7"  (1.702 m)   Wt 173 lb (78.5 kg)   BMI 27.10 kg/m      Review of Systems  Constitutional: Negative.   Musculoskeletal: Positive for arthralgias and back pain.  Psychiatric/Behavioral: Positive for sleep disturbance. The patient is nervous/anxious.        Objective:   Physical Exam  Constitutional: He appears well-developed and well-nourished. No distress.  Blood pressure 140/90  Cardiovascular: Normal rate and regular rhythm.   Pulmonary/Chest: Effort normal and breath sounds normal.  Musculoskeletal: He exhibits no edema.          Assessment  & Plan:   Hypertension.  Will continue dual therapy.  In view of history of gout.  Will attempt to avoid diuretic therapy CPX as scheduled Low-salt diet recommended Home blood pressure monitoring.  Encouraged  Nyoka Cowden

## 2016-06-27 ENCOUNTER — Other Ambulatory Visit (INDEPENDENT_AMBULATORY_CARE_PROVIDER_SITE_OTHER): Payer: Managed Care, Other (non HMO)

## 2016-06-27 DIAGNOSIS — Z0000 Encounter for general adult medical examination without abnormal findings: Secondary | ICD-10-CM | POA: Diagnosis not present

## 2016-06-27 LAB — CBC WITH DIFFERENTIAL/PLATELET
BASOS ABS: 0 10*3/uL (ref 0.0–0.1)
Basophils Relative: 0.3 % (ref 0.0–3.0)
EOS ABS: 0.2 10*3/uL (ref 0.0–0.7)
EOS PCT: 2.2 % (ref 0.0–5.0)
HCT: 41.7 % (ref 39.0–52.0)
HEMOGLOBIN: 14.4 g/dL (ref 13.0–17.0)
Lymphocytes Relative: 22.9 % (ref 12.0–46.0)
Lymphs Abs: 1.7 10*3/uL (ref 0.7–4.0)
MCHC: 34.5 g/dL (ref 30.0–36.0)
MCV: 87.9 fl (ref 78.0–100.0)
MONO ABS: 0.6 10*3/uL (ref 0.1–1.0)
Monocytes Relative: 8.1 % (ref 3.0–12.0)
Neutro Abs: 4.9 10*3/uL (ref 1.4–7.7)
Neutrophils Relative %: 66.5 % (ref 43.0–77.0)
Platelets: 248 10*3/uL (ref 150.0–400.0)
RBC: 4.75 Mil/uL (ref 4.22–5.81)
RDW: 14.8 % (ref 11.5–15.5)
WBC: 7.4 10*3/uL (ref 4.0–10.5)

## 2016-06-27 LAB — LIPID PANEL
CHOL/HDL RATIO: 4
Cholesterol: 233 mg/dL — ABNORMAL HIGH (ref 0–200)
HDL: 63 mg/dL (ref >39.00)
LDL Cholesterol: 136 mg/dL — ABNORMAL HIGH (ref 0–99)
NONHDL: 170.26
Triglycerides: 171 mg/dL — ABNORMAL HIGH (ref 0.0–149.0)
VLDL: 34.2 mg/dL (ref 0.0–40.0)

## 2016-06-27 LAB — BASIC METABOLIC PANEL
BUN: 15 mg/dL (ref 6–23)
CALCIUM: 10 mg/dL (ref 8.4–10.5)
CO2: 28 mEq/L (ref 19–32)
Chloride: 99 mEq/L (ref 96–112)
Creatinine, Ser: 1.26 mg/dL (ref 0.40–1.50)
GFR: 62.28 mL/min (ref >60.00)
Glucose, Bld: 103 mg/dL — ABNORMAL HIGH (ref 70–99)
POTASSIUM: 4.6 meq/L (ref 3.5–5.1)
SODIUM: 137 meq/L (ref 135–145)

## 2016-06-27 LAB — HEPATIC FUNCTION PANEL
ALK PHOS: 91 U/L (ref 39–117)
ALT: 21 U/L (ref 0–53)
AST: 22 U/L (ref 0–37)
Albumin: 4.6 g/dL (ref 3.5–5.2)
BILIRUBIN DIRECT: 0.1 mg/dL (ref 0.0–0.3)
BILIRUBIN TOTAL: 0.5 mg/dL (ref 0.2–1.2)
Total Protein: 7.4 g/dL (ref 6.0–8.3)

## 2016-06-27 LAB — TSH: TSH: 0.44 u[IU]/mL (ref 0.35–4.50)

## 2016-06-27 LAB — PSA: PSA: 0.52 ng/mL (ref 0.10–4.00)

## 2016-06-27 LAB — POC URINALSYSI DIPSTICK (AUTOMATED)
BILIRUBIN UA: NEGATIVE
GLUCOSE UA: NEGATIVE
KETONES UA: NEGATIVE
Leukocytes, UA: NEGATIVE
Nitrite, UA: NEGATIVE
PH UA: 5.5
Protein, UA: NEGATIVE
RBC UA: NEGATIVE
Spec Grav, UA: 1.01
Urobilinogen, UA: 0.2

## 2016-06-30 ENCOUNTER — Encounter: Payer: Self-pay | Admitting: Internal Medicine

## 2016-06-30 ENCOUNTER — Ambulatory Visit (INDEPENDENT_AMBULATORY_CARE_PROVIDER_SITE_OTHER): Payer: Managed Care, Other (non HMO) | Admitting: Internal Medicine

## 2016-06-30 VITALS — BP 150/88 | HR 96 | Temp 97.7°F | Ht 65.5 in | Wt 177.0 lb

## 2016-06-30 DIAGNOSIS — Z23 Encounter for immunization: Secondary | ICD-10-CM | POA: Diagnosis not present

## 2016-06-30 DIAGNOSIS — Z0000 Encounter for general adult medical examination without abnormal findings: Secondary | ICD-10-CM | POA: Diagnosis not present

## 2016-06-30 MED ORDER — ALPRAZOLAM 0.5 MG PO TABS: 0.5000 mg | ORAL_TABLET | Freq: Three times a day (TID) | ORAL | 2 refills | Status: DC

## 2016-06-30 NOTE — Patient Instructions (Addendum)
Limit your sodium (Salt) intake  Please check your blood pressure on a regular basis.  If it is consistently greater than 150/90, please make an office appointment.    It is important that you exercise regularly, at least 20 minutes 3 to 4 times per week.  If you develop chest pain or shortness of breath seek  medical attention.  Return in 4 months for follow-up  Health Maintenance, Male A healthy lifestyle and preventative care can promote health and wellness.  Maintain regular health, dental, and eye exams.  Eat a healthy diet. Foods like vegetables, fruits, whole grains, low-fat dairy products, and lean protein foods contain the nutrients you need and are low in calories. Decrease your intake of foods high in solid fats, added sugars, and salt. Get information about a proper diet from your health care provider, if necessary.  Regular physical exercise is one of the most important things you can do for your health. Most adults should get at least 150 minutes of moderate-intensity exercise (any activity that increases your heart rate and causes you to sweat) each week. In addition, most adults need muscle-strengthening exercises on 2 or more days a week.   Maintain a healthy weight. The body mass index (BMI) is a screening tool to identify possible weight problems. It provides an estimate of body fat based on height and weight. Your health care provider can find your BMI and can help you achieve or maintain a healthy weight. For males 20 years and older:  A BMI below 18.5 is considered underweight.  A BMI of 18.5 to 24.9 is normal.  A BMI of 25 to 29.9 is considered overweight.  A BMI of 30 and above is considered obese.  Maintain normal blood lipids and cholesterol by exercising and minimizing your intake of saturated fat. Eat a balanced diet with plenty of fruits and vegetables. Blood tests for lipids and cholesterol should begin at age 34 and be repeated every 5 years. If your lipid  or cholesterol levels are high, you are over age 55, or you are at high risk for heart disease, you may need your cholesterol levels checked more frequently.Ongoing high lipid and cholesterol levels should be treated with medicines if diet and exercise are not working.  If you smoke, find out from your health care provider how to quit. If you do not use tobacco, do not start.  Lung cancer screening is recommended for adults aged 56-80 years who are at high risk for developing lung cancer because of a history of smoking. A yearly low-dose CT scan of the lungs is recommended for people who have at least a 30-pack-year history of smoking and are current smokers or have quit within the past 15 years. A pack year of smoking is smoking an average of 1 pack of cigarettes a day for 1 year (for example, a 30-pack-year history of smoking could mean smoking 1 pack a day for 30 years or 2 packs a day for 15 years). Yearly screening should continue until the smoker has stopped smoking for at least 15 years. Yearly screening should be stopped for people who develop a health problem that would prevent them from having lung cancer treatment.  If you choose to drink alcohol, do not have more than 2 drinks per day. One drink is considered to be 12 oz (360 mL) of beer, 5 oz (150 mL) of wine, or 1.5 oz (45 mL) of liquor.  Avoid the use of street drugs. Do not share  needles with anyone. Ask for help if you need support or instructions about stopping the use of drugs.  High blood pressure causes heart disease and increases the risk of stroke. High blood pressure is more likely to develop in:  People who have blood pressure in the end of the normal range (100-139/85-89 mm Hg).  People who are overweight or obese.  People who are African American.  If you are 58-3 years of age, have your blood pressure checked every 3-5 years. If you are 57 years of age or older, have your blood pressure checked every year. You should  have your blood pressure measured twice-once when you are at a hospital or clinic, and once when you are not at a hospital or clinic. Record the average of the two measurements. To check your blood pressure when you are not at a hospital or clinic, you can use:  An automated blood pressure machine at a pharmacy.  A home blood pressure monitor.  If you are 60-32 years old, ask your health care provider if you should take aspirin to prevent heart disease.  Diabetes screening involves taking a blood sample to check your fasting blood sugar level. This should be done once every 3 years after age 27 if you are at a normal weight and without risk factors for diabetes. Testing should be considered at a younger age or be carried out more frequently if you are overweight and have at least 1 risk factor for diabetes.  Colorectal cancer can be detected and often prevented. Most routine colorectal cancer screening begins at the age of 8 and continues through age 65. However, your health care provider may recommend screening at an earlier age if you have risk factors for colon cancer. On a yearly basis, your health care provider may provide home test kits to check for hidden blood in the stool. A small camera at the end of a tube may be used to directly examine the colon (sigmoidoscopy or colonoscopy) to detect the earliest forms of colorectal cancer. Talk to your health care provider about this at age 55 when routine screening begins. A direct exam of the colon should be repeated every 5-10 years through age 20, unless early forms of precancerous polyps or small growths are found.  People who are at an increased risk for hepatitis B should be screened for this virus. You are considered at high risk for hepatitis B if:  You were born in a country where hepatitis B occurs often. Talk with your health care provider about which countries are considered high risk.  Your parents were born in a high-risk country and  you have not received a shot to protect against hepatitis B (hepatitis B vaccine).  You have HIV or AIDS.  You use needles to inject street drugs.  You live with, or have sex with, someone who has hepatitis B.  You are a man who has sex with other men (MSM).  You get hemodialysis treatment.  You take certain medicines for conditions like cancer, organ transplantation, and autoimmune conditions.  Hepatitis C blood testing is recommended for all people born from [birth year] through 1963 and any individual with known risk factors for hepatitis C.  Healthy men should no longer receive prostate-specific antigen (PSA) blood tests as part of routine cancer screening. Talk to your health care provider about prostate cancer screening.  Testicular cancer screening is not recommended for adolescents or adult males who have no symptoms. Screening includes self-exam, a health care  provider exam, and other screening tests. Consult with your health care provider about any symptoms you have or any concerns you have about testicular cancer.  Practice safe sex. Use condoms and avoid high-risk sexual practices to reduce the spread of sexually transmitted infections (STIs).  You should be screened for STIs, including gonorrhea and chlamydia if:  You are sexually active and are younger than 24 years.  You are older than 24 years, and your health care provider tells you that you are at risk for this type of infection.  Your sexual activity has changed since you were last screened, and you are at an increased risk for chlamydia or gonorrhea. Ask your health care provider if you are at risk.  If you are at risk of being infected with HIV, it is recommended that you take a prescription medicine daily to prevent HIV infection. This is called pre-exposure prophylaxis (PrEP). You are considered at risk if:  You are a man who has sex with other men (MSM).  You are a heterosexual man who is sexually active with  multiple partners.  You take drugs by injection.  You are sexually active with a partner who has HIV.  Talk with your health care provider about whether you are at high risk of being infected with HIV. If you choose to begin PrEP, you should first be tested for HIV. You should then be tested every 3 months for as long as you are taking PrEP.  Use sunscreen. Apply sunscreen liberally and repeatedly throughout the day. You should seek shade when your shadow is shorter than you. Protect yourself by wearing long sleeves, pants, a wide-brimmed hat, and sunglasses year round whenever you are outdoors.  Tell your health care provider of new moles or changes in moles, especially if there is a change in shape or color. Also, tell your health care provider if a mole is larger than the size of a pencil eraser.  A one-time screening for abdominal aortic aneurysm (AAA) and surgical repair of large AAAs by ultrasound is recommended for men aged 42-75 years who are current or former smokers.  Stay current with your vaccines (immunizations). This information is not intended to replace advice given to you by your health care provider. Make sure you discuss any questions you have with your health care provider. Document Released: 01/06/2008 Document Revised: 07/31/2014 Document Reviewed: 04/13/2015 Elsevier Interactive Patient Education  2017 [employer].

## 2016-06-30 NOTE — Progress Notes (Signed)
Pre visit review using our clinic review tool, if applicable. No additional management support is needed unless otherwise documented below in the visit note. 

## 2016-06-30 NOTE — Progress Notes (Signed)
Subjective:    Patient ID: Aaron Black, male    DOB: 09/12/1955, 60 y.o.   MRN: [id number]  HPI  60 year old patient who is seen today for a wellness exam. He has a history of essential hypertension and dyslipidemia.  He has a history of ongoing tobacco use.  He has an extensive orthopedic history.  He has had 8 orthopedic procedures including bilateral shoulder surgery times 2.  Dr. Noemi Chapel is his orthopedic physician. He has had 5 neurosurgical procedures by Dr. Vertell Limber in the cervical and lumbar area  He is followed by rheumatology.  He has ongoing shoulder and hip discomfort.  His most recent surgery was lumbar surgery, which was very helpful with left leg radiculopathy.  Past Medical History:  Diagnosis Date  . Anemia   . Anxiety   . Arthritis   . B12 deficiency   . Cervical disc disease    a. 07/2003 ant cervical deomcpression and fusion C5-6/C6-7;  b. 09/2007 post cervical laminectomy C4-5 with scres and arthrodesis C4-C7.  Marland Kitchen Chest pain at rest 02/25/2013  . Chronic lower back pain   . Colon polyps   . Complication of anesthesia    " i WOKE UP DURING A COLONOSCOPY "- 2008  . Depression   . Diverticulosis of colon   . GERD (gastroesophageal reflux disease)   . Gout   . Hyperlipidemia   . Hypertension   . Pleurisy   . Shortness of breath dyspnea    with exertion- "I cant exercise now"  . Sleep apnea   . Tobacco abuse    a. 40 yrs, 1.5-3 ppd over that time.     Social History   Social History  . Marital status: Married    Spouse name: N/A  . Number of children: N/A  . Years of education: N/A   Occupational History  . Not on file.   Social History Main Topics  . Smoking status: Heavy Tobacco Smoker    Packs/day: 0.50    Years: 40.00    Types: Cigarettes    Last attempt to quit: 02/24/2013  . Smokeless tobacco: Never Used     Comment: Currently smoking 1.5 ppd.  Has previously smoked up to 3 ppd and did so for about 20 yrs.  . Alcohol use No     Comment:  at least 3 beers/night.- 01/20/16  . Drug use: No  . Sexual activity: Not on file   Other Topics Concern  . Not on file   Social History Narrative   Lives in [place] with wife.  Does not work.  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    Past Surgical History:  Procedure Laterality Date  . ANTERIOR LATERAL LUMBAR FUSION 4 LEVELS Right 06/05/2014   Procedure: ANTERIOR LATERAL LUMBAR FUSION  LUMBAR ONE TO LUMBAR FIVE with Percutaneous Pedicle Screws;  Surgeon: Erline Levine, MD;  Location: [facility];  Service: Neurosurgery;  Laterality: Right;  . CARPAL TUNNEL RELEASE Right   . CERVICAL DISC SURGERY     X 2  . COLONOSCOPY    . ELBOW ARTHROSCOPY Right   . KNEE ARTHROSCOPY Right 08/2013   torn menicus  . KNEE ARTHROSCOPY Left 11/2013   chip cartlidge,torn menicus  . LUMBAR LAMINECTOMY/DECOMPRESSION MICRODISCECTOMY Right 07/11/2013   Procedure: LUMBAR LAMINECTOMY/DECOMPRESSION MICRODISCECTOMY 1 LEVEL;  Surgeon: Erline Levine, MD;  Location: [facility];  Service: Neurosurgery;  Laterality: Right;  Right L34 microdiskectomy  . LUMBAR PERCUTANEOUS PEDICLE SCREW 4 LEVEL N/A 06/05/2014  Procedure: LUMBAR PERCUTANEOUS PEDICLE SCREW 4 LEVEL;  Surgeon: Erline Levine, MD;  Location: [facility];  Service: Neurosurgery;  Laterality: N/A;  . NECK SURGERY  January 2005, March 2009   C-Spine  . ROTATOR CUFF REPAIR  August 2000, January 2002, July 2008, July 2009   2 left 2 right    Family History  Problem Relation Age of Onset  . Bone cancer Mother   . Colon polyps Mother   . Squamous cell carcinoma Mother     died @ 32  . Heart attack Mother   . Hypertension Father   . Colon polyps Father   . Diabetes Father     borderline  . Congestive Heart Failure Father     alive @ 39  . Heart failure Father   . Sudden death Brother     died @ 28  . Other Brother     died in [place] @ 25 - struck by drunk driver  . Other Brother     died in M[place] @ 3 - struck by drunk driver  . Heart attack  Brother   . Stroke Paternal Grandfather     Allergies  Allergen Reactions  . Lisinopril Swelling    Swollen Lip  . Percodan [Oxycodone-Aspirin] Rash    Percodan caused rash---but he can tolerate Percocet and aspirin on their own     Current Outpatient Prescriptions on File Prior to Visit  Medication Sig Dispense Refill  . allopurinol (ZYLOPRIM) 100 MG tablet Take 1 tablet (100 mg total) by mouth daily. 90 tablet 1  . colchicine 0.6 MG tablet Take 1 tablet (0.6 mg total) by mouth daily as needed (GOUT). 30 tablet 0  . cyclobenzaprine (FLEXERIL) 10 MG tablet Take 1 tablet (10 mg total) by mouth 2 (two) times daily as needed for muscle spasms. 60 tablet 0  . diltiazem (CARDIZEM CD) 240 MG 24 hr capsule TAKE ONE CAPSULE BY MOUTH EVERY DAY 90 capsule 3  . HYDROcodone-acetaminophen (NORCO/VICODIN) 5-325 MG per tablet Take 1 tablet by mouth every 6 (six) hours as needed for moderate pain. 70 tablet 0  . losartan (COZAAR) 100 MG tablet Take 1 tablet (100 mg total) by mouth daily. 90 tablet 3  . omeprazole (PRILOSEC OTC) 20 MG tablet Take 2 tablets (40 mg total) by mouth daily.    . rosuvastatin (CRESTOR) 5 MG tablet Take 1 tablet (5 mg total) by mouth daily. 90 tablet 1  . sertraline (ZOLOFT) 50 MG tablet TAKE 1 TABLET (50 MG TOTAL) BY MOUTH DAILY. 90 tablet 3  . vitamin B-12 (CYANOCOBALAMIN) 1000 MCG tablet Take 1,000 mcg by mouth daily.     No current facility-administered medications on file prior to visit.     BP (!) 150/88 (BP Location: Right Arm, Patient Position: Sitting, Cuff Size: Normal)   Pulse 96   Temp 97.7 F (36.5 C) (Oral)   Ht 5' 5.5" (1.664 m)   Wt 177 lb (80.3 kg)   SpO2 98%   BMI 29.01 kg/m     Review of Systems  Constitutional: Negative for appetite change, chills, fatigue and fever.  HENT: Negative for congestion, dental problem, ear pain, hearing loss, sore throat, tinnitus, trouble swallowing and voice change.   Eyes: Negative for pain, discharge and visual  disturbance.  Respiratory: Negative for cough, chest tightness, wheezing and stridor.   Cardiovascular: Negative for chest pain, palpitations and leg swelling.  Gastrointestinal: Negative for abdominal distention, abdominal pain, blood in stool, constipation, diarrhea, nausea and  vomiting.  Genitourinary: Negative for difficulty urinating, discharge, flank pain, genital sores, hematuria and urgency.  Musculoskeletal: Positive for arthralgias, back pain, gait problem, neck pain and neck stiffness. Negative for joint swelling and myalgias.  Skin: Negative for rash.  Neurological: Negative for dizziness, syncope, speech difficulty, weakness, numbness and headaches.  Hematological: Negative for adenopathy. Does not bruise/bleed easily.  Psychiatric/Behavioral: Negative for behavioral problems and dysphoric mood. The patient is not nervous/anxious.        Objective:   Physical Exam  Constitutional: He appears well-developed and well-nourished.  Blood pressure 145/90  HENT:  Head: Normocephalic and atraumatic.  Right Ear: External ear normal.  Left Ear: External ear normal.  Nose: Nose normal.  Mouth/Throat: Oropharynx is clear and moist.  Eyes: Conjunctivae and EOM are normal. Pupils are equal, round, and reactive to light. No scleral icterus.  Neck: Normal range of motion. Neck supple. No JVD present. No thyromegaly present.  Cardiovascular: Regular rhythm, normal heart sounds and intact distal pulses.  Exam reveals no gallop and no friction rub.   No murmur heard. Pulmonary/Chest: Effort normal and breath sounds normal. He exhibits no tenderness.  Abdominal: Soft. Bowel sounds are normal. He exhibits no distension and no mass. There is no tenderness.  Genitourinary: Prostate normal and penis normal.  Musculoskeletal: Normal range of motion. He exhibits no edema or tenderness.  Lymphadenopathy:    He has no cervical adenopathy.  Neurological: He is alert. He has normal reflexes. No  cranial nerve deficit. Coordination normal.  Skin: Skin is warm and dry. No rash noted.  Multiple surgical scars involving neck, lumbar shoulder and flank areas  Psychiatric: He has a normal mood and affect. His behavior is normal.          Assessment & Plan:   Preventive health exam Hypertension.  Will continue present regimen.  Continue home blood pressure monitoring.  Blood pressures are generally better controlled then noted today Dyslipidemia.  Due to concerns about drug drug interactions simvastatin discontinued and patient has a new prescription for Crestor 5, which he just started taking;  will continue on present dose Tobacco abuse.  Total smoking cessation encouraged Generalized osteoarthritis History of gout  Follow-up 4 months  Emitt Maglione Pilar Plate

## 2016-07-16 ENCOUNTER — Other Ambulatory Visit: Payer: Self-pay | Admitting: Internal Medicine

## 2016-07-18 ENCOUNTER — Other Ambulatory Visit: Payer: Self-pay | Admitting: Internal Medicine

## 2016-08-30 ENCOUNTER — Encounter: Payer: Self-pay | Admitting: *Deleted

## 2016-08-30 ENCOUNTER — Other Ambulatory Visit: Payer: Self-pay | Admitting: Acute Care

## 2016-08-30 DIAGNOSIS — F17210 Nicotine dependence, cigarettes, uncomplicated: Secondary | ICD-10-CM

## 2016-09-13 ENCOUNTER — Encounter: Payer: Self-pay | Admitting: Acute Care

## 2016-09-13 ENCOUNTER — Ambulatory Visit (INDEPENDENT_AMBULATORY_CARE_PROVIDER_SITE_OTHER)
Admission: RE | Admit: 2016-09-13 | Discharge: 2016-09-13 | Disposition: A | Discharge status: Home / Self Care | Payer: Managed Care, Other (non HMO) | Source: Ambulatory Visit | Attending: Acute Care | Admitting: Acute Care

## 2016-09-13 ENCOUNTER — Ambulatory Visit (INDEPENDENT_AMBULATORY_CARE_PROVIDER_SITE_OTHER): Payer: Managed Care, Other (non HMO) | Admitting: Acute Care

## 2016-09-13 DIAGNOSIS — Z87891 Personal history of nicotine dependence: Secondary | ICD-10-CM

## 2016-09-13 DIAGNOSIS — F17210 Nicotine dependence, cigarettes, uncomplicated: Secondary | ICD-10-CM | POA: Diagnosis not present

## 2016-09-13 NOTE — Progress Notes (Signed)
Shared Decision Making Visit Lung Cancer Screening Program [phone])   Eligibility:  Age 61 y.o.  Pack Years Smoking History Calculation 31 pack year smoking history (# packs/per year x # years smoked)  Recent History of coughing up blood  no  Unexplained weight loss? no ( >Than 15 pounds within the last 6 months )  Prior History Lung / other cancer no (Diagnosis within the last 5 years already requiring surveillance chest CT Scans).  Smoking Status Current Smoker  Former Smokers: Years since quit: NA  Quit Date: NA  Visit Components:  Discussion included one or more decision making aids. yes  Discussion included risk/benefits of screening. yes  Discussion included potential follow up diagnostic testing for abnormal scans. yes  Discussion included meaning and risk of over diagnosis. yes  Discussion included meaning and risk of False Positives. yes  Discussion included meaning of total radiation exposure. yes  Counseling Included:  Importance of adherence to annual lung cancer LDCT screening. yes  Impact of comorbidities on ability to participate in the program. yes  Ability and willingness to under diagnostic treatment. yes  Smoking Cessation Counseling:  Current Smokers:   Discussed importance of smoking cessation. yes  Information about tobacco cessation classes and interventions provided to patient. yes  Patient provided with "ticket" for LDCT Scan. yes  Symptomatic Patient. no  Counseling: NA  Diagnosis Code: Tobacco Use Z72.0  Asymptomatic Patient yes  Counseling (Intermediate counseling: > three minutes counseling) [id number]  Former Smokers:   Discussed the importance of maintaining cigarette abstinence. yes  Diagnosis Code: Personal History of Nicotine Dependence. [id number]  Information about tobacco cessation classes and interventions provided to patient. Yes  Patient provided with "ticket" for LDCT Scan. yes  Written Order for Lung Cancer  Screening with LDCT placed in Epic. Yes (CT Chest Lung Cancer Screening Low Dose W/O CM) [id number] Z12.2-Screening of respiratory organs Z87.891-Personal history of nicotine dependence  I spent 3 minutes counseling on smoking cessation during this visit.  I have spent 25 minutes of face to face time with Mr. Donez discussing the risks and benefits of lung cancer screening. We viewed a power point together that explained in detail the above noted topics. We paused at intervals to allow for questions to be asked and answered to ensure understanding.We discussed that the single most powerful action that he can take to decrease his risk of developing lung cancer is to quit smoking. We discussed whether or not he is ready to commit to setting a quit date. He is currently not ready to set a quit date. He is currently smoking about 3 cigarettes a day. We discussed options for tools to aid in quitting smoking including nicotine replacement therapy, non-nicotine medications, support groups, Quit Smart classes, and behavior modification. We discussed that often times setting smaller, more achievable goals, such as eliminating 1 cigarette a day for a week and then 2 cigarettes a day for a week can be helpful in slowly decreasing the number of cigarettes smoked. This allows for a sense of accomplishment as well as providing a clinical benefit. I gave him the " Be Stronger Than Your Excuses" card with contact information for community resources, classes, free nicotine replacement therapy, and access to mobile apps, text messaging, and on-line smoking cessation help. I have also given him my card and contact information in the event he needs to contact me. We discussed the time and location of the scan, and that either June Leap, CMA, or I will call with  the results within 24-48 hours of receiving them. I have provided Mr. Soine with a copy of the power point we viewed  as a resource in the event they need  reinforcement of the concepts we discussed today in the office. The patient verbalized understanding of all of  the above and had no further questions upon leaving the office. They have my contact information in the event they have any further questions.  We did discuss the high incidence of coronary artery disease noted on the scan. I explained that as a non-gated exam degree of severity cannot be determined. Mr. Tomasino is currently on a statin, and is monitoring his cholesterol and triglycerides to his primary care provider.  Magdalen Spatz, NP 09/13/2016

## 2016-09-15 ENCOUNTER — Telehealth: Payer: Self-pay | Admitting: Acute Care

## 2016-09-15 DIAGNOSIS — F17210 Nicotine dependence, cigarettes, uncomplicated: Principal | ICD-10-CM

## 2016-09-19 NOTE — Telephone Encounter (Signed)
Spoke with pt to advise on chest ct results - Pt states that he spoke to someone regarding ct results on 09/15/16 and he was advised that we will repeat ct in 1 yr . Nothing further needed.

## 2016-10-05 ENCOUNTER — Ambulatory Visit (INDEPENDENT_AMBULATORY_CARE_PROVIDER_SITE_OTHER): Payer: Managed Care, Other (non HMO) | Admitting: Family Medicine

## 2016-10-05 ENCOUNTER — Encounter: Payer: Self-pay | Admitting: Family Medicine

## 2016-10-05 VITALS — BP 144/82 | HR 90 | Temp 98.0°F | Ht 65.5 in | Wt 183.3 lb

## 2016-10-05 DIAGNOSIS — R059 Cough, unspecified: Secondary | ICD-10-CM

## 2016-10-05 DIAGNOSIS — J988 Other specified respiratory disorders: Secondary | ICD-10-CM | POA: Diagnosis not present

## 2016-10-05 DIAGNOSIS — R05 Cough: Secondary | ICD-10-CM | POA: Diagnosis not present

## 2016-10-05 DIAGNOSIS — Z720 Tobacco use: Secondary | ICD-10-CM | POA: Diagnosis not present

## 2016-10-05 MED ORDER — PREDNISONE 20 MG PO TABS: 40.0000 mg | ORAL_TABLET | Freq: Every day | ORAL | 0 refills | Status: DC

## 2016-10-05 MED ORDER — BENZONATATE 100 MG PO CAPS: 100.0000 mg | ORAL_CAPSULE | Freq: Three times a day (TID) | ORAL | 0 refills | Status: DC | PRN

## 2016-10-05 MED ORDER — DOXYCYCLINE HYCLATE 100 MG PO TABS: 100.0000 mg | ORAL_TABLET | Freq: Two times a day (BID) | ORAL | 0 refills | Status: DC

## 2016-10-05 NOTE — Patient Instructions (Signed)
Take the prednisone as instructed.  Tessalon as needed for cough.  Use the antibiotic if needed if not improving over the next few days.  I hope you are feeling better soon! Seek care immediately if worsening, new concerns or you are not improving with treatment.

## 2016-10-05 NOTE — Progress Notes (Signed)
HPI:  Acute visit for Congestion/Cough: -started: 3 weeks ago -symptoms:nasal congestion, sore throat, cough - most symptoms resolved but cough not improving and is productive and he feels is in his chest -denies:fever, SOB, NVD, tooth pain, sinus pain, wheezing -has tried: nothing -sick contacts/travel/risks: no reported flu, strep or tick exposure -Hx of: allergies, smoking ROS: See pertinent positives and negatives per HPI.  Past Medical History:  Diagnosis Date  . Anemia   . Anxiety   . Arthritis   . B12 deficiency   . Cervical disc disease    a. 07/2003 ant cervical deomcpression and fusion C5-6/C6-7;  b. 09/2007 post cervical laminectomy C4-5 with scres and arthrodesis C4-C7.  Marland Kitchen Chest pain at rest 02/25/2013  . Chronic lower back pain   . Colon polyps   . Complication of anesthesia    " i WOKE UP DURING A COLONOSCOPY "- 2008  . Depression   . Diverticulosis of colon   . GERD (gastroesophageal reflux disease)   . Gout   . Hyperlipidemia   . Hypertension   . Pleurisy   . Shortness of breath dyspnea    with exertion- "I cant exercise now"  . Sleep apnea   . Tobacco abuse    a. 40 yrs, 1.5-3 ppd over that time.    Past Surgical History:  Procedure Laterality Date  . ANTERIOR LATERAL LUMBAR FUSION 4 LEVELS Right 06/05/2014   Procedure: ANTERIOR LATERAL LUMBAR FUSION  LUMBAR ONE TO LUMBAR FIVE with Percutaneous Pedicle Screws;  Surgeon: Erline Levine, MD;  Location: [facility];  Service: Neurosurgery;  Laterality: Right;  . CARPAL TUNNEL RELEASE Right   . CERVICAL DISC SURGERY     X 2  . COLONOSCOPY    . ELBOW ARTHROSCOPY Right   . KNEE ARTHROSCOPY Right 08/2013   torn menicus  . KNEE ARTHROSCOPY Left 11/2013   chip cartlidge,torn menicus  . LUMBAR LAMINECTOMY/DECOMPRESSION MICRODISCECTOMY Right 07/11/2013   Procedure: LUMBAR LAMINECTOMY/DECOMPRESSION MICRODISCECTOMY 1 LEVEL;  Surgeon: Erline Levine, MD;  Location: [facility];  Service: Neurosurgery;  Laterality:  Right;  Right L34 microdiskectomy  . LUMBAR PERCUTANEOUS PEDICLE SCREW 4 LEVEL N/A 06/05/2014   Procedure: LUMBAR PERCUTANEOUS PEDICLE SCREW 4 LEVEL;  Surgeon: Erline Levine, MD;  Location: [facility];  Service: Neurosurgery;  Laterality: N/A;  . NECK SURGERY  January 2005, March 2009   C-Spine  . ROTATOR CUFF REPAIR  August 2000, January 2002, July 2008, July 2009   2 left 2 right    Family History  Problem Relation Age of Onset  . Bone cancer Mother   . Colon polyps Mother   . Squamous cell carcinoma Mother     died @ 74  . Heart attack Mother   . Hypertension Father   . Colon polyps Father   . Diabetes Father     borderline  . Congestive Heart Failure Father     alive @ 110  . Heart failure Father   . Sudden death Brother     died @ 55  . Other Brother     died in [place] @ 46 - struck by drunk driver  . Other Brother     died in [place] @ 78 - struck by drunk driver  . Heart attack Brother   . Stroke Paternal Grandfather     Social History   Social History  . Marital status: Married    Spouse name: N/A  . Number of children: N/A  . Years of education:  N/A   Social History Main Topics  . Smoking status: Heavy Tobacco Smoker    Packs/day: 0.50    Years: 40.00    Types: Cigarettes    Last attempt to quit: 02/24/2013  . Smokeless tobacco: Never Used     Comment: Currently smoking 1.5 ppd.  Has previously smoked up to 3 ppd and did so for about 20 yrs.  . Alcohol use No     Comment: at least 3 beers/night.- 01/20/16  . Drug use: No  . Sexual activity: Not Asked   Other Topics Concern  . None   Social History Narrative   Lives in [place] with wife.  Does not work.  Previously read H2O meters for city of [place].     Current Outpatient Prescriptions:  .  allopurinol (ZYLOPRIM) 100 MG tablet, TAKE 1 TABLET DAILY, Disp: 90 tablet, Rfl: 0 .  allopurinol (ZYLOPRIM) 100 MG tablet, TAKE 1 TABLET DAILY, Disp: 90 tablet, Rfl: 1 .  ALPRAZolam (XANAX) 0.5 MG tablet, Take 1 tablet  (0.5 mg total) by mouth 3 (three) times daily., Disp: 90 tablet, Rfl: 2 .  colchicine 0.6 MG tablet, Take 1 tablet (0.6 mg total) by mouth daily as needed (GOUT)., Disp: 30 tablet, Rfl: 0 .  cyclobenzaprine (FLEXERIL) 10 MG tablet, Take 1 tablet (10 mg total) by mouth 2 (two) times daily as needed for muscle spasms., Disp: 60 tablet, Rfl: 0 .  diltiazem (CARDIZEM CD) 240 MG 24 hr capsule, TAKE ONE CAPSULE BY MOUTH EVERY DAY, Disp: 90 capsule, Rfl: 3 .  HYDROcodone-acetaminophen (NORCO/VICODIN) 5-325 MG per tablet, Take 1 tablet by mouth every 6 (six) hours as needed for moderate pain., Disp: 70 tablet, Rfl: 0 .  losartan (COZAAR) 100 MG tablet, Take 1 tablet (100 mg total) by mouth daily., Disp: 90 tablet, Rfl: 3 .  omeprazole (PRILOSEC OTC) 20 MG tablet, Take 2 tablets (40 mg total) by mouth daily., Disp: , Rfl:  .  rosuvastatin (CRESTOR) 5 MG tablet, Take 1 tablet (5 mg total) by mouth daily., Disp: 90 tablet, Rfl: 1 .  sertraline (ZOLOFT) 50 MG tablet, TAKE 1 TABLET (50 MG TOTAL) BY MOUTH DAILY., Disp: 90 tablet, Rfl: 3 .  vitamin B-12 (CYANOCOBALAMIN) 1000 MCG tablet, Take 1,000 mcg by mouth daily., Disp: , Rfl:  .  benzonatate (TESSALON PERLES) 100 MG capsule, Take 1 capsule (100 mg total) by mouth 3 (three) times daily as needed., Disp: 20 capsule, Rfl: 0 .  doxycycline (VIBRA-TABS) 100 MG tablet, Take 1 tablet (100 mg total) by mouth 2 (two) times daily., Disp: 14 tablet, Rfl: 0 .  predniSONE (DELTASONE) 20 MG tablet, Take 2 tablets (40 mg total) by mouth daily with breakfast., Disp: 8 tablet, Rfl: 0  EXAM:  Vitals:   10/05/16 0951  BP: (!) 144/82  Pulse: 90  Temp: 98 F (36.7 C)    Body mass index is 30.04 kg/m.  GENERAL: vitals reviewed and listed above, alert, oriented, appears well hydrated and in no acute distress  HEENT: atraumatic, conjunttiva clear, no obvious abnormalities on inspection of external nose and ears, normal appearance of ear canals and TMs, clear nasal  congestion, mild post oropharyngeal erythema with PND, no tonsillar edema or exudate, no sinus TTP  NECK: no obvious masses on inspection  LUNGS: clear to auscultation bilaterally, no wheezes, rales or rhonchi, good air movement  CV: HRRR, no peripheral edema  MS: moves all extremities without noticeable abnormality  PSYCH: pleasant and cooperative, no obvious depression or anxiety  ASSESSMENT AND PLAN:  Discussed the following assessment and plan:  Cough  Respiratory infection  Tobacco use  e discussed potential etiologies, with VURI being most likely, and post viral cough. Given smoking potential for likely underlying mild or undiagnosed COPD. We discussed treatment side effects, likely course, antibiotic misuse, transmission, and signs of developing a serious illness. He would like to try a course of prednisone and tessalon and an abx if not improving. Advised to quit smoking. -of course, we advised to return or notify a doctor immediately if symptoms worsen or persist or new concerns arise.    Patient Instructions  Take the prednisone as instructed.  Tessalon as needed for cough.  Use the antibiotic if needed if not improving over the next few days.  I hope you are feeling better soon! Seek care immediately if worsening, new concerns or you are not improving with treatment.       Colin Benton R., DO

## 2016-10-05 NOTE — Progress Notes (Signed)
Pre visit review using our clinic review tool, if applicable. No additional management support is needed unless otherwise documented below in the visit note. 

## 2016-10-11 ENCOUNTER — Other Ambulatory Visit: Payer: Self-pay | Admitting: Family Medicine

## 2016-10-15 ENCOUNTER — Other Ambulatory Visit: Payer: Self-pay | Admitting: Internal Medicine

## 2016-10-16 NOTE — Telephone Encounter (Signed)
Ok to refill once, but should see PCP if worsening or persistent issues. Thanks.

## 2016-10-18 ENCOUNTER — Other Ambulatory Visit: Payer: Self-pay | Admitting: Internal Medicine

## 2016-10-18 ENCOUNTER — Encounter: Payer: Self-pay | Admitting: Internal Medicine

## 2016-10-18 MED ORDER — ATORVASTATIN CALCIUM 20 MG PO TABS: 20.0000 mg | ORAL_TABLET | Freq: Every day | ORAL | 1 refills | Status: DC

## 2016-11-13 ENCOUNTER — Other Ambulatory Visit: Payer: Self-pay | Admitting: Internal Medicine

## 2016-11-19 ENCOUNTER — Other Ambulatory Visit: Payer: Self-pay | Admitting: Internal Medicine

## 2016-11-30 ENCOUNTER — Encounter: Payer: Self-pay | Admitting: Internal Medicine

## 2017-01-12 ENCOUNTER — Other Ambulatory Visit: Payer: Self-pay | Admitting: Internal Medicine

## 2017-02-05 ENCOUNTER — Ambulatory Visit (AMBULATORY_SURGERY_CENTER): Payer: Self-pay

## 2017-02-05 ENCOUNTER — Encounter: Payer: Self-pay | Admitting: Internal Medicine

## 2017-02-05 VITALS — Ht 67.0 in | Wt 174.2 lb

## 2017-02-05 DIAGNOSIS — Z1211 Encounter for screening for malignant neoplasm of colon: Secondary | ICD-10-CM

## 2017-02-05 MED ORDER — NA SULFATE-K SULFATE-MG SULF 17.5-3.13-1.6 GM/177ML PO SOLN: 1.0000 | Freq: Once | ORAL | 0 refills | Status: AC

## 2017-02-05 NOTE — Progress Notes (Signed)
Denies allergies to eggs or soy products. Denies complication of anesthesia or sedation. Denies use of weight loss medication. Denies use of O2.   Emmi instructions declined. Patient does not have a computer.

## 2017-02-19 ENCOUNTER — Encounter: Payer: Self-pay | Admitting: Internal Medicine

## 2017-02-19 ENCOUNTER — Ambulatory Visit (AMBULATORY_SURGERY_CENTER): Payer: Managed Care, Other (non HMO) | Admitting: Internal Medicine

## 2017-02-19 VITALS — BP 107/79 | HR 83 | Temp 98.9°F | Resp 16 | Ht 67.0 in | Wt 174.0 lb

## 2017-02-19 DIAGNOSIS — D120 Benign neoplasm of cecum: Secondary | ICD-10-CM | POA: Diagnosis not present

## 2017-02-19 DIAGNOSIS — D123 Benign neoplasm of transverse colon: Secondary | ICD-10-CM

## 2017-02-19 DIAGNOSIS — D122 Benign neoplasm of ascending colon: Secondary | ICD-10-CM

## 2017-02-19 DIAGNOSIS — Z1212 Encounter for screening for malignant neoplasm of rectum: Secondary | ICD-10-CM | POA: Diagnosis not present

## 2017-02-19 DIAGNOSIS — Z1211 Encounter for screening for malignant neoplasm of colon: Secondary | ICD-10-CM

## 2017-02-19 DIAGNOSIS — K635 Polyp of colon: Secondary | ICD-10-CM | POA: Diagnosis not present

## 2017-02-19 MED ORDER — SODIUM CHLORIDE 0.9 % IV SOLN: 500.0000 mL | INTRAVENOUS | Status: DC

## 2017-02-19 NOTE — Progress Notes (Signed)
Called to room to assist during endoscopic procedure.  Patient ID and intended procedure confirmed with present staff. Received instructions for my participation in the procedure from the performing physician.  

## 2017-02-19 NOTE — Progress Notes (Signed)
To PACU VSS. Report to [email or web address] 

## 2017-02-19 NOTE — Op Note (Addendum)
[facility] Patient Name: Aaron Black Procedure Date: 02/19/2017 8:08 AM MRN: [id number] Endoscopist: Docia Chuck. Henrene Pastor , MD Age: 61 Referring MD:  Date of Birth: 18-Mar-1956 Gender: Male Account #: [id number] Procedure:                Colonoscopy with cold snare polypectomy x 4 Indications:              Screening for colorectal malignant neoplasm. Prior                            exam May 2008 with diverticulosis Medicines:                Monitored Anesthesia Care Procedure:                Pre-Anesthesia Assessment:                           - Prior to the procedure, a History and Physical                            was performed, and patient medications and                            allergies were reviewed. The patient's tolerance of                            previous anesthesia was also reviewed. The risks                            and benefits of the procedure and the sedation                            options and risks were discussed with the patient.                            All questions were answered, and informed consent                            was obtained. Prior Anticoagulants: The patient has                            taken no previous anticoagulant or antiplatelet                            agents. ASA Grade Assessment: II - A patient with                            mild systemic disease. After reviewing the risks                            and benefits, the patient was deemed in                            satisfactory condition to undergo the procedure.  After obtaining informed consent, the colonoscope                            was passed under direct vision. Throughout the                            procedure, the patient's blood pressure, pulse, and                            oxygen saturations were monitored continuously. The                            Colonoscope was introduced through the anus and   advanced to the the cecum, identified by                            appendiceal orifice and ileocecal valve. The                            ileocecal valve, appendiceal orifice, and rectum                            were photographed. The quality of the bowel                            preparation was excellent. The colonoscopy was                            performed without difficulty. The patient tolerated                            the procedure well. The bowel preparation used was                            SUPREP. Scope In: 8:13:31 AM Scope Out: 8:30:29 AM Scope Withdrawal Time: 0 hours 14 minutes 20 seconds  Total Procedure Duration: 0 hours 16 minutes 58 seconds  Findings:                 Four polyps were found in the transverse colon,                            ascending colon and ileocecal valve. The polyps                            were 1 to 4 mm in size. These polyps were removed                            with a cold snare. Resection and retrieval were                            complete.                           Multiple diverticula were found in the transverse  colon and left colon.                           Internal hemorrhoids were found during                            retroflexion. The hemorrhoids were small.                           The exam was otherwise without abnormality on                            direct and retroflexion views. Complications:            No immediate complications. Estimated blood loss:                            None. Estimated Blood Loss:     Estimated blood loss: none. Impression:               - Four 1 to 4 mm polyps in the transverse colon, in                            the ascending colon and at the ileocecal valve,                            removed with a cold snare. Resected and retrieved.                           - Diverticulosis in the transverse colon and in the                            left colon.                            - Internal hemorrhoids.                           - The examination was otherwise normal on direct                            and retroflexion views. Recommendation:           - Repeat colonoscopy in 3 - 5 years for                            surveillance.                           - Patient has a contact number available for                            emergencies. The signs and symptoms of potential                            delayed complications were discussed with the  patient. Return to normal activities tomorrow.                            Written discharge instructions were provided to the                            patient.                           - Resume previous diet.                           - Continue present medications.                           - Await pathology results. Docia Chuck. Henrene Pastor, MD 02/19/2017 8:37:01 AM This report has been signed electronically.

## 2017-02-19 NOTE — Patient Instructions (Signed)
  Handouts given: Polyps, Diverticulosis, and Hemorrhoids.  YOU HAD AN ENDOSCOPIC PROCEDURE TODAY AT [facility]:   Refer to the procedure report that was given to you for any specific questions about what was found during the examination.  If the procedure report does not answer your questions, please call your gastroenterologist to clarify.  If you requested that your care partner not be given the details of your procedure findings, then the procedure report has been included in a sealed envelope for you to review at your convenience later.  YOU SHOULD EXPECT: Some feelings of bloating in the abdomen. Passage of more gas than usual.  Walking can help get rid of the air that was put into your GI tract during the procedure and reduce the bloating. If you had a lower endoscopy (such as a colonoscopy or flexible sigmoidoscopy) you may notice spotting of blood in your stool or on the toilet paper. If you underwent a bowel prep for your procedure, you may not have a normal bowel movement for a few days.  Please Note:  You might notice some irritation and congestion in your nose or some drainage.  This is from the oxygen used during your procedure.  There is no need for concern and it should clear up in a day or so.  SYMPTOMS TO REPORT IMMEDIATELY:   Following lower endoscopy (colonoscopy or flexible sigmoidoscopy):  Excessive amounts of blood in the stool  Significant tenderness or worsening of abdominal pains  Swelling of the abdomen that is new, acute  Fever of 100F or higher   For urgent or emergent issues, a gastroenterologist can be reached at any hour by calling [phone].   DIET:  We do recommend a small meal at first, but then you may proceed to your regular diet.  Drink plenty of fluids but you should avoid alcoholic beverages for 24 hours.  ACTIVITY:  You should plan to take it easy for the rest of today and you should NOT DRIVE or use heavy machinery until  tomorrow (because of the sedation medicines used during the test).    FOLLOW UP: Our staff will call the number listed on your records the next business day following your procedure to check on you and address any questions or concerns that you may have regarding the information given to you following your procedure. If we do not reach you, we will leave a message.  However, if you are feeling well and you are not experiencing any problems, there is no need to return our call.  We will assume that you have returned to your regular daily activities without incident.  If any biopsies were taken you will be contacted by phone or by letter within the next 1-3 weeks.  Please call us at [phone] if you have not heard about the biopsies in 3 weeks.    SIGNATURES/CONFIDENTIALITY: You and/or your care partner have signed paperwork which will be entered into your electronic medical record.  These signatures attest to the fact that that the information above on your After Visit Summary has been reviewed and is understood.  Full responsibility of the confidentiality of this discharge information lies with you and/or your care-partner.

## 2017-02-20 ENCOUNTER — Telehealth: Payer: Self-pay | Admitting: *Deleted

## 2017-02-20 NOTE — Telephone Encounter (Signed)
  Follow up Call-  Call back number 02/19/2017  Post procedure Call Back phone  # [phone]  Permission to leave phone message No  Some recent data might be hidden     Patient questions:  Do you have a fever, pain , or abdominal swelling? No. Pain Score  0 *  Have you tolerated food without any problems? Yes.    Have you been able to return to your normal activities? Yes.    Do you have any questions about your discharge instructions: Diet   No. Medications  No. Follow up visit  No.  Do you have questions or concerns about your Care? No.  Actions: * If pain score is 4 or above: No action needed, pain <4.

## 2017-02-20 NOTE — Telephone Encounter (Signed)
Chart opened in error

## 2017-02-22 ENCOUNTER — Encounter: Payer: Self-pay | Admitting: Internal Medicine

## 2017-03-02 ENCOUNTER — Encounter: Payer: Self-pay | Admitting: Internal Medicine

## 2017-03-02 ENCOUNTER — Ambulatory Visit (INDEPENDENT_AMBULATORY_CARE_PROVIDER_SITE_OTHER): Payer: Managed Care, Other (non HMO) | Admitting: Internal Medicine

## 2017-03-02 VITALS — BP 158/82 | HR 87 | Temp 98.1°F | Ht 67.0 in | Wt 175.2 lb

## 2017-03-02 DIAGNOSIS — Z720 Tobacco use: Secondary | ICD-10-CM

## 2017-03-02 DIAGNOSIS — I10 Essential (primary) hypertension: Secondary | ICD-10-CM

## 2017-03-02 MED ORDER — DILTIAZEM HCL ER COATED BEADS 360 MG PO CP24: 360.0000 mg | ORAL_CAPSULE | Freq: Every day | ORAL | 2 refills | Status: DC

## 2017-03-02 MED ORDER — ALBUTEROL SULFATE HFA 108 (90 BASE) MCG/ACT IN AERS: 2.0000 | INHALATION_SPRAY | Freq: Four times a day (QID) | RESPIRATORY_TRACT | 0 refills | Status: DC | PRN

## 2017-03-02 NOTE — Progress Notes (Signed)
Subjective:    Patient ID: Aaron Black, male    DOB: 1955-12-24, 61 y.o.   MRN: [id number]  HPI  61 year old patient who has essential hypertension. Blood pressure readings have been consistently elevated lately.  At the time of colonoscopy and also at his rheumatology office visit. He has had several episodes acute dyspnea and choking.  He does have radiographic evidence of COPD on CT scanning and ongoing tobacco use.  Last night.  EMS was called after he awoke with a sensation of choking that he felt was related to mucus.  There is no real wheezing identified.  Evaluation by EMS revealed a normal pulse and normal EKG.  Oxygen saturation was 97%.  He declined ED evaluation  Past Medical History:  Diagnosis Date  . Anemia   . Anxiety   . Arthritis   . B12 deficiency   . Cervical disc disease    a. 07/2003 ant cervical deomcpression and fusion C5-6/C6-7;  b. 09/2007 post cervical laminectomy C4-5 with scres and arthrodesis C4-C7.  Marland Kitchen Chest pain at rest 02/25/2013  . Chronic lower back pain   . Complication of anesthesia    " i WOKE UP DURING A COLONOSCOPY "- 2008  . Depression   . Diverticulosis of colon   . GERD (gastroesophageal reflux disease)   . Gout   . Hyperlipidemia   . Hypertension   . Pleurisy   . Shortness of breath dyspnea    with exertion- "I cant exercise now"  . Sleep apnea    Patient denies  . Tobacco abuse    a. 40 yrs, 1.5-3 ppd over that time.     Social History   Social History  . Marital status: Married    Spouse name: N/A  . Number of children: N/A  . Years of education: N/A   Occupational History  . Not on file.   Social History Main Topics  . Smoking status: Heavy Tobacco Smoker    Packs/day: 0.50    Years: 40.00    Types: Cigarettes    Last attempt to quit: 02/24/2013  . Smokeless tobacco: Never Used     Comment: Currently smoking 1.5 ppd.  Has previously smoked up to 3 ppd and did so for about 20 yrs.  . Alcohol use Yes     Comment: at  least 3 beers/night.- 01/20/16  . Drug use: No  . Sexual activity: Not on file   Other Topics Concern  . Not on file   Social History Narrative   Lives in [place] with wife.  Does not work.  Previously read H2O meters for city of [place].    Past Surgical History:  Procedure Laterality Date  . ANTERIOR LATERAL LUMBAR FUSION 4 LEVELS Right 06/05/2014   Procedure: ANTERIOR LATERAL LUMBAR FUSION  LUMBAR ONE TO LUMBAR FIVE with Percutaneous Pedicle Screws;  Surgeon: Erline Levine, MD;  Location: [facility];  Service: Neurosurgery;  Laterality: Right;  . CARPAL TUNNEL RELEASE Right   . CERVICAL DISC SURGERY     X 2  . COLONOSCOPY    . ELBOW ARTHROSCOPY Right   . KNEE ARTHROSCOPY Right 08/2013   torn menicus  . KNEE ARTHROSCOPY Left 11/2013   chip cartlidge,torn menicus  . LUMBAR LAMINECTOMY/DECOMPRESSION MICRODISCECTOMY Right 07/11/2013   Procedure: LUMBAR LAMINECTOMY/DECOMPRESSION MICRODISCECTOMY 1 LEVEL;  Surgeon: Erline Levine, MD;  Location: [facility];  Service: Neurosurgery;  Laterality: Right;  Right L34 microdiskectomy  . LUMBAR PERCUTANEOUS PEDICLE SCREW 4 LEVEL N/A 06/05/2014  Procedure: LUMBAR PERCUTANEOUS PEDICLE SCREW 4 LEVEL;  Surgeon: Erline Levine, MD;  Location: [facility];  Service: Neurosurgery;  Laterality: N/A;  . NECK SURGERY  January 2005, March 2009   C-Spine  . ROTATOR CUFF REPAIR  August 2000, January 2002, July 2008, July 2009   2 left 2 right    Family History  Problem Relation Age of Onset  . Bone cancer Mother   . Squamous cell carcinoma Mother        died @ 46  . Heart attack Mother   . Hypertension Father   . Diabetes Father        borderline  . Congestive Heart Failure Father        alive @ 51  . Heart failure Father   . Sudden death Brother        died @ 70  . Other Brother        died in [place] @ 29 - struck by drunk driver  . Other Brother        died in [place] @ 69 - struck by drunk driver  . Heart attack Brother   . Stroke Paternal Grandfather    . Colon cancer Neg Hx   . Esophageal cancer Neg Hx   . Rectal cancer Neg Hx   . Stomach cancer Neg Hx     Allergies  Allergen Reactions  . Lisinopril Swelling    Swollen Lip  . Percodan [Oxycodone-Aspirin] Rash    Percodan caused rash---but he can tolerate Percocet and aspirin on their own     Current Outpatient Prescriptions on File Prior to Visit  Medication Sig Dispense Refill  . allopurinol (ZYLOPRIM) 100 MG tablet TAKE 1 TABLET DAILY 90 tablet 1  . ALPRAZolam (XANAX) 0.5 MG tablet TAKE 1 TABLET BY MOUTH 3 TIMES A DAY 90 tablet 2  . atorvastatin (LIPITOR) 20 MG tablet Take 1 tablet (20 mg total) by mouth daily. 90 tablet 1  . colchicine 0.6 MG tablet Take 1 tablet (0.6 mg total) by mouth daily as needed (GOUT). 30 tablet 0  . cyclobenzaprine (FLEXERIL) 10 MG tablet Take 1 tablet (10 mg total) by mouth 2 (two) times daily as needed for muscle spasms. 60 tablet 0  . HYDROcodone-acetaminophen (NORCO/VICODIN) 5-325 MG per tablet Take 1 tablet by mouth every 6 (six) hours as needed for moderate pain. 70 tablet 0  . losartan (COZAAR) 100 MG tablet TAKE 1 TABLET (100 MG TOTAL) BY MOUTH DAILY. 90 tablet 3  . omeprazole (PRILOSEC OTC) 20 MG tablet Take 2 tablets (40 mg total) by mouth daily.    . sertraline (ZOLOFT) 50 MG tablet TAKE 1 TABLET (50 MG TOTAL) BY MOUTH DAILY. 90 tablet 3  . vitamin B-12 (CYANOCOBALAMIN) 1000 MCG tablet Take 1,000 mcg by mouth daily.     Current Facility-Administered Medications on File Prior to Visit  Medication Dose Route Frequency Provider Last Rate Last Dose  . 0.9 %  sodium chloride infusion  500 mL Intravenous Continuous Irene Shipper, MD        BP (!) 158/82 (BP Location: Left Arm, Patient Position: Sitting, Cuff Size: Normal)   Pulse 87   Temp 98.1 F (36.7 C) (Oral)   Ht 5\' 7"  (1.702 m)   Wt 175 lb 3.2 oz (79.5 kg)   SpO2 96%   BMI 27.44 kg/m     Review of Systems  Constitutional: Negative for appetite change, chills, fatigue and fever.    HENT: Negative for congestion,  dental problem, ear pain, hearing loss, sore throat, tinnitus, trouble swallowing and voice change.   Eyes: Negative for pain, discharge and visual disturbance.  Respiratory: Positive for cough and shortness of breath. Negative for chest tightness, wheezing and stridor.   Cardiovascular: Negative for chest pain, palpitations and leg swelling.  Gastrointestinal: Negative for abdominal distention, abdominal pain, blood in stool, constipation, diarrhea, nausea and vomiting.  Genitourinary: Negative for difficulty urinating, discharge, flank pain, genital sores, hematuria and urgency.  Musculoskeletal: Negative for arthralgias, back pain, gait problem, joint swelling, myalgias and neck stiffness.  Skin: Negative for rash.  Neurological: Negative for dizziness, syncope, speech difficulty, weakness, numbness and headaches.  Hematological: Negative for adenopathy. Does not bruise/bleed easily.  Psychiatric/Behavioral: Negative for behavioral problems and dysphoric mood. The patient is not nervous/anxious.        Objective:   Physical Exam  Constitutional: He appears well-developed and well-nourished. He appears distressed.  Lowest blood pressure 140/84  HENT:  Mouth/Throat: Oropharynx is clear and moist.  Neck: Neck supple.  Cardiovascular: Regular rhythm.   Pulmonary/Chest: Effort normal and breath sounds normal.          Assessment & Plan:   Essential hypertension.  Patient has had frequent blood pressure elevations.  Will increase diltiazem to 3 and 60 mg daily.  Will place on a DASH diet Total smoking cessation encouraged COPD.  Patient has had paroxysms of acute shortness of breath.  Will give a trial of albuterol  inhaler Follow-up  in 4 weeks  Low-salt diet recommended  Nyoka Cowden

## 2017-03-02 NOTE — Patient Instructions (Addendum)
Limit your sodium (Salt) intake  Please check your blood pressure on a regular basis.  If it is consistently greater than 150/90, please make an office appointment.  Return in 4 weeks for follow-up   DASH Eating Plan DASH stands for "Dietary Approaches to Stop Hypertension." The DASH eating plan is a healthy eating plan that has been shown to reduce high blood pressure (hypertension). It may also reduce your risk for type 2 diabetes, heart disease, and stroke. The DASH eating plan may also help with weight loss. What are tips for following this plan? General guidelines  Avoid eating more than 2,300 mg (milligrams) of salt (sodium) a day. If you have hypertension, you may need to reduce your sodium intake to 1,500 mg a day.  Limit alcohol intake to no more than 1 drink a day for nonpregnant women and 2 drinks a day for men. One drink equals 12 oz of beer, 5 oz of wine, or 1 oz of hard liquor.  Work with your health care provider to maintain a healthy body weight or to lose weight. Ask what an ideal weight is for you.  Get at least 30 minutes of exercise that causes your heart to beat faster (aerobic exercise) most days of the week. Activities may include walking, swimming, or biking.  Work with your health care provider or diet and nutrition specialist (dietitian) to adjust your eating plan to your individual calorie needs. Reading food labels  Check food labels for the amount of sodium per serving. Choose foods with less than 5 percent of the Daily Value of sodium. Generally, foods with less than 300 mg of sodium per serving fit into this eating plan.  To find whole grains, look for the word "whole" as the first word in the ingredient list. Shopping  Buy products labeled as "low-sodium" or "no salt added."  Buy fresh foods. Avoid canned foods and premade or frozen meals. Cooking  Avoid adding salt when cooking. Use salt-free seasonings or herbs instead of table salt or sea salt.  Check with your health care provider or pharmacist before using salt substitutes.  Do not fry foods. Cook foods using healthy methods such as baking, boiling, grilling, and broiling instead.  Cook with heart-healthy oils, such as olive, canola, soybean, or sunflower oil. Meal planning   Eat a balanced diet that includes: ? 5 or more servings of fruits and vegetables each day. At each meal, try to fill half of your plate with fruits and vegetables. ? Up to 6-8 servings of whole grains each day. ? Less than 6 oz of lean meat, poultry, or fish each day. A 3-oz serving of meat is about the same size as a deck of cards. One egg equals 1 oz. ? 2 servings of low-fat dairy each day. ? A serving of nuts, seeds, or beans 5 times each week. ? Heart-healthy fats. Healthy fats called Omega-3 fatty acids are found in foods such as flaxseeds and coldwater fish, like sardines, salmon, and mackerel.  Limit how much you eat of the following: ? Canned or prepackaged foods. ? Food that is high in trans fat, such as fried foods. ? Food that is high in saturated fat, such as fatty meat. ? Sweets, desserts, sugary drinks, and other foods with added sugar. ? Full-fat dairy products.  Do not salt foods before eating.  Try to eat at least 2 vegetarian meals each week.  Eat more home-cooked food and less restaurant, buffet, and fast food.  When  eating at a restaurant, ask that your food be prepared with less salt or no salt, if possible. What foods are recommended? The items listed may not be a complete list. Talk with your dietitian about what dietary choices are best for you. Grains Whole-grain or whole-wheat bread. Whole-grain or whole-wheat pasta. Brown rice. Modena Morrow. Bulgur. Whole-grain and low-sodium cereals. Pita bread. Low-fat, low-sodium crackers. Whole-wheat flour tortillas. Vegetables Fresh or frozen vegetables (raw, steamed, roasted, or grilled). Low-sodium or reduced-sodium tomato and  vegetable juice. Low-sodium or reduced-sodium tomato sauce and tomato paste. Low-sodium or reduced-sodium canned vegetables. Fruits All fresh, dried, or frozen fruit. Canned fruit in natural juice (without added sugar). Meat and other protein foods Skinless chicken or [place]. Ground chicken or [place]. Pork with fat trimmed off. Fish and seafood. Egg whites. Dried beans, peas, or lentils. Unsalted nuts, nut butters, and seeds. Unsalted canned beans. Lean cuts of beef with fat trimmed off. Low-sodium, lean deli meat. Dairy Low-fat (1%) or fat-free (skim) milk. Fat-free, low-fat, or reduced-fat cheeses. Nonfat, low-sodium ricotta or cottage cheese. Low-fat or nonfat yogurt. Low-fat, low-sodium cheese. Fats and oils Soft margarine without trans fats. Vegetable oil. Low-fat, reduced-fat, or light mayonnaise and salad dressings (reduced-sodium). Canola, safflower, olive, soybean, and sunflower oils. Avocado. Seasoning and other foods Herbs. Spices. Seasoning mixes without salt. Unsalted popcorn and pretzels. Fat-free sweets. What foods are not recommended? The items listed may not be a complete list. Talk with your dietitian about what dietary choices are best for you. Grains Baked goods made with fat, such as croissants, muffins, or some breads. Dry pasta or rice meal packs. Vegetables Creamed or fried vegetables. Vegetables in a cheese sauce. Regular canned vegetables (not low-sodium or reduced-sodium). Regular canned tomato sauce and paste (not low-sodium or reduced-sodium). Regular tomato and vegetable juice (not low-sodium or reduced-sodium). Angie Fava. Olives. Fruits Canned fruit in a light or heavy syrup. Fried fruit. Fruit in cream or butter sauce. Meat and other protein foods Fatty cuts of meat. Ribs. Fried meat. Berniece Salines. Sausage. Bologna and other processed lunch meats. Salami. Fatback. Hotdogs. Bratwurst. Salted nuts and seeds. Canned beans with added salt. Canned or smoked fish. Whole eggs or  egg yolks. Chicken or [place] with skin. Dairy Whole or 2% milk, cream, and half-and-half. Whole or full-fat cream cheese. Whole-fat or sweetened yogurt. Full-fat cheese. Nondairy creamers. Whipped toppings. Processed cheese and cheese spreads. Fats and oils Butter. Stick margarine. Lard. Shortening. Ghee. Bacon fat. Tropical oils, such as coconut, palm kernel, or palm oil. Seasoning and other foods Salted popcorn and pretzels. Onion salt, garlic salt, seasoned salt, table salt, and sea salt. Worcestershire sauce. Tartar sauce. Barbecue sauce. Teriyaki sauce. Soy sauce, including reduced-sodium. Steak sauce. Canned and packaged gravies. Fish sauce. Oyster sauce. Cocktail sauce. Horseradish that you find on the shelf. Ketchup. Mustard. Meat flavorings and tenderizers. Bouillon cubes. Hot sauce and Tabasco sauce. Premade or packaged marinades. Premade or packaged taco seasonings. Relishes. Regular salad dressings. Where to find more information:  National Heart, Lung, and [place]: https://wilson-eaton.com/  American Heart Association: www.heart.org Summary  The DASH eating plan is a healthy eating plan that has been shown to reduce high blood pressure (hypertension). It may also reduce your risk for type 2 diabetes, heart disease, and stroke.  With the DASH eating plan, you should limit salt (sodium) intake to 2,300 mg a day. If you have hypertension, you may need to reduce your sodium intake to 1,500 mg a day.  When on the DASH eating plan, aim  to eat more fresh fruits and vegetables, whole grains, lean proteins, low-fat dairy, and heart-healthy fats.  Work with your health care provider or diet and nutrition specialist (dietitian) to adjust your eating plan to your individual calorie needs. This information is not intended to replace advice given to you by your health care provider. Make sure you discuss any questions you have with your health care provider. Document Released: 06/29/2011  Document Revised: 07/03/2016 Document Reviewed: 07/03/2016 Elsevier Interactive Patient Education  2017 [employer].

## 2017-03-05 ENCOUNTER — Telehealth: Payer: Self-pay | Admitting: Internal Medicine

## 2017-03-05 ENCOUNTER — Other Ambulatory Visit: Payer: Self-pay | Admitting: Internal Medicine

## 2017-03-05 MED ORDER — ALBUTEROL SULFATE HFA 108 (90 BASE) MCG/ACT IN AERS: 2.0000 | INHALATION_SPRAY | Freq: Four times a day (QID) | RESPIRATORY_TRACT | 3 refills | Status: DC | PRN

## 2017-03-05 MED ORDER — DILTIAZEM HCL ER COATED BEADS 360 MG PO CP24: 360.0000 mg | ORAL_CAPSULE | Freq: Every day | ORAL | 3 refills | Status: DC

## 2017-03-05 MED ORDER — LOSARTAN POTASSIUM 100 MG PO TABS: 100.0000 mg | ORAL_TABLET | Freq: Every day | ORAL | 3 refills | Status: DC

## 2017-03-05 NOTE — Telephone Encounter (Signed)
Spoke with pharmacy and clarified dose.

## 2017-03-05 NOTE — Telephone Encounter (Signed)
Pharmacy is calling in reference to pt Rx diltiazem with changes of dosage.  Pharmacy state that the pt told them that he had to call the EMS on Saturday due to his Bp issue but did not go to the hospital.

## 2017-03-05 NOTE — Telephone Encounter (Signed)
The albuterol needs to go to [facility] not [facility]. Please call pt once rx has been sent to [facility]

## 2017-03-05 NOTE — Telephone Encounter (Signed)
Pt calling to check the status of Rx

## 2017-03-05 NOTE — Addendum Note (Signed)
Addended by: Abelardo Diesel on: 03/05/2017 09:03 AM   Modules accepted: Orders

## 2017-03-05 NOTE — Telephone Encounter (Signed)
Pt state that the pharmacy has not received Rx diltiazem and would like to see if it can be re-send     Pharm:  [facility].

## 2017-03-06 ENCOUNTER — Other Ambulatory Visit: Payer: Self-pay | Admitting: Internal Medicine

## 2017-03-06 MED ORDER — DILTIAZEM HCL ER COATED BEADS 360 MG PO CP24: 360.0000 mg | ORAL_CAPSULE | Freq: Every day | ORAL | 3 refills | Status: DC

## 2017-03-06 NOTE — Telephone Encounter (Signed)
Spoke with [employer] from [place] and requested for the pt's Diltiazem to be canceled (as I requested yesterday) she stated that medication was already in the mail.  Requested to send medication back to pharmacy, Evelena Peat stated she would attempt to get medication back.

## 2017-03-06 NOTE — Telephone Encounter (Signed)
Pt said we must cancel diltiazem request to [facility] before [facility]

## 2017-03-06 NOTE — Telephone Encounter (Signed)
Please advise 

## 2017-03-07 ENCOUNTER — Other Ambulatory Visit: Payer: Self-pay | Admitting: Internal Medicine

## 2017-03-07 MED ORDER — DILTIAZEM HCL ER COATED BEADS 360 MG PO CP24
360.0000 mg | ORAL_CAPSULE | Freq: Every day | ORAL | 3 refills | Status: DC
Start: 2017-03-07 — End: 2018-03-18

## 2017-03-14 ENCOUNTER — Other Ambulatory Visit: Payer: Self-pay | Admitting: Internal Medicine

## 2017-03-20 ENCOUNTER — Other Ambulatory Visit: Payer: Self-pay | Admitting: Internal Medicine

## 2017-03-22 ENCOUNTER — Other Ambulatory Visit: Payer: Self-pay | Admitting: Internal Medicine

## 2017-04-11 ENCOUNTER — Other Ambulatory Visit: Payer: Self-pay | Admitting: Internal Medicine

## 2017-04-12 ENCOUNTER — Other Ambulatory Visit: Payer: Self-pay | Admitting: Internal Medicine

## 2017-04-12 NOTE — Telephone Encounter (Signed)
Spoke with patient about 4 week follow up regarding blood pressure. Patient states that since he saw his rheumatologist his blood pressure has been controlled. Patient states that he has been checking blood pressure and feels he does not need to come in for a follow up at this time.

## 2017-05-23 ENCOUNTER — Other Ambulatory Visit: Payer: Self-pay | Admitting: Internal Medicine

## 2017-05-24 ENCOUNTER — Ambulatory Visit (INDEPENDENT_AMBULATORY_CARE_PROVIDER_SITE_OTHER): Payer: 59 | Admitting: *Deleted

## 2017-05-24 DIAGNOSIS — Z23 Encounter for immunization: Secondary | ICD-10-CM

## 2017-06-23 ENCOUNTER — Inpatient Hospital Stay (HOSPITAL_COMMUNITY): Payer: 59

## 2017-06-23 ENCOUNTER — Encounter (HOSPITAL_COMMUNITY): Payer: Self-pay

## 2017-06-23 ENCOUNTER — Other Ambulatory Visit: Payer: Self-pay

## 2017-06-23 ENCOUNTER — Inpatient Hospital Stay (HOSPITAL_COMMUNITY)
Admission: EM | Admit: 2017-06-23 | Discharge: 2017-06-27 | DRG: 872 | Disposition: A | Discharge status: Home / Self Care | Payer: 59 | Attending: Internal Medicine | Admitting: Internal Medicine

## 2017-06-23 DIAGNOSIS — Z981 Arthrodesis status: Secondary | ICD-10-CM

## 2017-06-23 DIAGNOSIS — L03111 Cellulitis of right axilla: Secondary | ICD-10-CM | POA: Diagnosis present

## 2017-06-23 DIAGNOSIS — Z8249 Family history of ischemic heart disease and other diseases of the circulatory system: Secondary | ICD-10-CM | POA: Diagnosis not present

## 2017-06-23 DIAGNOSIS — K219 Gastro-esophageal reflux disease without esophagitis: Secondary | ICD-10-CM | POA: Diagnosis present

## 2017-06-23 DIAGNOSIS — Z79891 Long term (current) use of opiate analgesic: Secondary | ICD-10-CM

## 2017-06-23 DIAGNOSIS — G8929 Other chronic pain: Secondary | ICD-10-CM | POA: Diagnosis present

## 2017-06-23 DIAGNOSIS — E785 Hyperlipidemia, unspecified: Secondary | ICD-10-CM | POA: Diagnosis present

## 2017-06-23 DIAGNOSIS — L0390 Cellulitis, unspecified: Secondary | ICD-10-CM | POA: Diagnosis present

## 2017-06-23 DIAGNOSIS — I10 Essential (primary) hypertension: Secondary | ICD-10-CM | POA: Diagnosis present

## 2017-06-23 DIAGNOSIS — Z888 Allergy status to other drugs, medicaments and biological substances status: Secondary | ICD-10-CM

## 2017-06-23 DIAGNOSIS — M1000 Idiopathic gout, unspecified site: Secondary | ICD-10-CM | POA: Diagnosis not present

## 2017-06-23 DIAGNOSIS — L03113 Cellulitis of right upper limb: Secondary | ICD-10-CM

## 2017-06-23 DIAGNOSIS — W5503XA Scratched by cat, initial encounter: Secondary | ICD-10-CM

## 2017-06-23 DIAGNOSIS — Z885 Allergy status to narcotic agent status: Secondary | ICD-10-CM

## 2017-06-23 DIAGNOSIS — F419 Anxiety disorder, unspecified: Secondary | ICD-10-CM | POA: Diagnosis present

## 2017-06-23 DIAGNOSIS — D649 Anemia, unspecified: Secondary | ICD-10-CM | POA: Diagnosis present

## 2017-06-23 DIAGNOSIS — A419 Sepsis, unspecified organism: Principal | ICD-10-CM

## 2017-06-23 DIAGNOSIS — Z808 Family history of malignant neoplasm of other organs or systems: Secondary | ICD-10-CM | POA: Diagnosis not present

## 2017-06-23 DIAGNOSIS — M109 Gout, unspecified: Secondary | ICD-10-CM | POA: Diagnosis present

## 2017-06-23 DIAGNOSIS — Z720 Tobacco use: Secondary | ICD-10-CM | POA: Diagnosis not present

## 2017-06-23 DIAGNOSIS — M545 Low back pain: Secondary | ICD-10-CM | POA: Diagnosis present

## 2017-06-23 DIAGNOSIS — E538 Deficiency of other specified B group vitamins: Secondary | ICD-10-CM | POA: Diagnosis present

## 2017-06-23 DIAGNOSIS — F17210 Nicotine dependence, cigarettes, uncomplicated: Secondary | ICD-10-CM | POA: Diagnosis present

## 2017-06-23 DIAGNOSIS — F329 Major depressive disorder, single episode, unspecified: Secondary | ICD-10-CM | POA: Diagnosis present

## 2017-06-23 LAB — COMPREHENSIVE METABOLIC PANEL
ALBUMIN: 4.5 g/dL (ref 3.5–5.0)
ALK PHOS: 79 U/L (ref 38–126)
ALT: 28 U/L (ref 17–63)
ANION GAP: 12 (ref 5–15)
AST: 37 U/L (ref 15–41)
BILIRUBIN TOTAL: 1.1 mg/dL (ref 0.3–1.2)
BUN: 16 mg/dL (ref 6–20)
CALCIUM: 9.1 mg/dL (ref 8.9–10.3)
CO2: 18 mmol/L — ABNORMAL LOW (ref 22–32)
CREATININE: 1.09 mg/dL (ref 0.61–1.24)
Chloride: 100 mmol/L — ABNORMAL LOW (ref 101–111)
GFR calc non Af Amer: >60 mL/min (ref >60)
GLUCOSE: 85 mg/dL (ref 65–99)
Potassium: 4.4 mmol/L (ref 3.5–5.1)
Sodium: 130 mmol/L — ABNORMAL LOW (ref 135–145)
TOTAL PROTEIN: 7.4 g/dL (ref 6.5–8.1)

## 2017-06-23 LAB — CREATININE, SERUM
CREATININE: 1.08 mg/dL (ref 0.61–1.24)
GFR calc Af Amer: >60 mL/min (ref >60)

## 2017-06-23 LAB — CBC
HCT: 35.2 % — ABNORMAL LOW (ref 39.0–52.0)
HEMOGLOBIN: 12 g/dL — AB (ref 13.0–17.0)
MCH: 31.7 pg (ref 26.0–34.0)
MCHC: 34.1 g/dL (ref 30.0–36.0)
MCV: 93.1 fL (ref 78.0–100.0)
Platelets: 188 10*3/uL (ref 150–400)
RBC: 3.78 MIL/uL — ABNORMAL LOW (ref 4.22–5.81)
RDW: 13.7 % (ref 11.5–15.5)
WBC: 12.7 10*3/uL — AB (ref 4.0–10.5)

## 2017-06-23 LAB — CBC WITH DIFFERENTIAL/PLATELET
BASOS ABS: 0 10*3/uL (ref 0.0–0.1)
BASOS PCT: 0 %
EOS ABS: 0.1 10*3/uL (ref 0.0–0.7)
Eosinophils Relative: 1 %
HEMATOCRIT: 43.2 % (ref 39.0–52.0)
Hemoglobin: 14.9 g/dL (ref 13.0–17.0)
Lymphocytes Relative: 12 %
Lymphs Abs: 2.1 10*3/uL (ref 0.7–4.0)
MCH: 32.3 pg (ref 26.0–34.0)
MCHC: 34.5 g/dL (ref 30.0–36.0)
MCV: 93.5 fL (ref 78.0–100.0)
MONO ABS: 1.6 10*3/uL — AB (ref 0.1–1.0)
MONOS PCT: 9 %
NEUTROS ABS: 13.4 10*3/uL — AB (ref 1.7–7.7)
Neutrophils Relative %: 78 %
PLATELETS: 201 10*3/uL (ref 150–400)
RBC: 4.62 MIL/uL (ref 4.22–5.81)
RDW: 13.4 % (ref 11.5–15.5)
WBC: 17.2 10*3/uL — ABNORMAL HIGH (ref 4.0–10.5)

## 2017-06-23 LAB — LACTIC ACID, PLASMA
LACTIC ACID, VENOUS: 2.1 mmol/L — AB (ref 0.5–1.9)
LACTIC ACID, VENOUS: 1.3 mmol/L (ref 0.5–1.9)

## 2017-06-23 LAB — I-STAT CG4 LACTIC ACID, ED: Lactic Acid, Venous: 4.13 mmol/L (ref 0.5–1.9)

## 2017-06-23 MED ORDER — ALBUTEROL SULFATE (2.5 MG/3ML) 0.083% IN NEBU: 2.5000 mg | INHALATION_SOLUTION | Freq: Four times a day (QID) | RESPIRATORY_TRACT | Status: DC | PRN

## 2017-06-23 MED ORDER — PIPERACILLIN-TAZOBACTAM 3.375 G IVPB
3.3750 g | Freq: Three times a day (TID) | INTRAVENOUS | Status: DC
  Administered 2017-06-23 – 2017-06-26 (×9): 3.375 g via INTRAVENOUS
  Filled 2017-06-23 (×11): qty 50

## 2017-06-23 MED ORDER — ENOXAPARIN SODIUM 40 MG/0.4ML ~~LOC~~ SOLN
40.0000 mg | SUBCUTANEOUS | Status: DC
  Administered 2017-06-23 – 2017-06-26 (×4): 40 mg via SUBCUTANEOUS
  Filled 2017-06-23 (×4): qty 0.4

## 2017-06-23 MED ORDER — CYCLOBENZAPRINE HCL 10 MG PO TABS
10.0000 mg | ORAL_TABLET | Freq: Two times a day (BID) | ORAL | Status: DC | PRN
  Administered 2017-06-24 – 2017-06-26 (×4): 10 mg via ORAL
  Filled 2017-06-23 (×4): qty 1

## 2017-06-23 MED ORDER — ACETAMINOPHEN 325 MG PO TABS
650.0000 mg | ORAL_TABLET | Freq: Four times a day (QID) | ORAL | Status: DC | PRN
  Administered 2017-06-23 – 2017-06-26 (×7): 650 mg via ORAL
  Filled 2017-06-23 (×6): qty 2

## 2017-06-23 MED ORDER — MORPHINE SULFATE (PF) 4 MG/ML IV SOLN
2.0000 mg | INTRAVENOUS | Status: DC | PRN
  Administered 2017-06-23 – 2017-06-27 (×13): 2 mg via INTRAVENOUS
  Filled 2017-06-23 (×13): qty 1

## 2017-06-23 MED ORDER — VITAMIN B-12 1000 MCG PO TABS
1000.0000 ug | ORAL_TABLET | Freq: Every day | ORAL | Status: DC
  Administered 2017-06-24 – 2017-06-27 (×4): 1000 ug via ORAL
  Filled 2017-06-23 (×4): qty 1

## 2017-06-23 MED ORDER — LOSARTAN POTASSIUM 50 MG PO TABS
100.0000 mg | ORAL_TABLET | Freq: Every day | ORAL | Status: DC
  Administered 2017-06-24 – 2017-06-27 (×4): 100 mg via ORAL
  Filled 2017-06-23 (×5): qty 2

## 2017-06-23 MED ORDER — VANCOMYCIN HCL IN DEXTROSE 1-5 GM/200ML-% IV SOLN
1000.0000 mg | Freq: Two times a day (BID) | INTRAVENOUS | Status: DC
  Administered 2017-06-24 – 2017-06-26 (×5): 1000 mg via INTRAVENOUS
  Filled 2017-06-23 (×6): qty 200

## 2017-06-23 MED ORDER — IBUPROFEN 200 MG PO TABS
600.0000 mg | ORAL_TABLET | Freq: Four times a day (QID) | ORAL | Status: DC | PRN
  Administered 2017-06-23 – 2017-06-25 (×4): 600 mg via ORAL
  Filled 2017-06-23: qty 3
  Filled 2017-06-23: qty 1
  Filled 2017-06-23 (×3): qty 3

## 2017-06-23 MED ORDER — SERTRALINE HCL 50 MG PO TABS
50.0000 mg | ORAL_TABLET | Freq: Every day | ORAL | Status: DC
  Administered 2017-06-24 – 2017-06-27 (×4): 50 mg via ORAL
  Filled 2017-06-23 (×4): qty 1

## 2017-06-23 MED ORDER — ONDANSETRON HCL 4 MG/2ML IJ SOLN: 4.0000 mg | Freq: Four times a day (QID) | INTRAMUSCULAR | Status: DC | PRN

## 2017-06-23 MED ORDER — DILTIAZEM HCL ER COATED BEADS 360 MG PO CP24
360.0000 mg | ORAL_CAPSULE | Freq: Every day | ORAL | Status: DC
  Administered 2017-06-24 – 2017-06-27 (×4): 360 mg via ORAL
  Filled 2017-06-23 (×3): qty 2
  Filled 2017-06-23: qty 1
  Filled 2017-06-23 (×2): qty 2
  Filled 2017-06-23 (×3): qty 1

## 2017-06-23 MED ORDER — VANCOMYCIN HCL IN DEXTROSE 1-5 GM/200ML-% IV SOLN
1000.0000 mg | Freq: Once | INTRAVENOUS | Status: DC
  Administered 2017-06-23: 1000 mg via INTRAVENOUS
  Filled 2017-06-23: qty 200

## 2017-06-23 MED ORDER — PIPERACILLIN-TAZOBACTAM 3.375 G IVPB 30 MIN: 3.3750 g | Freq: Once | INTRAVENOUS | Status: DC

## 2017-06-23 MED ORDER — SODIUM CHLORIDE 0.9 % IV SOLN
INTRAVENOUS | Status: AC
  Administered 2017-06-23: 75 mL/h via INTRAVENOUS
  Administered 2017-06-24: 09:00:00 via INTRAVENOUS

## 2017-06-23 MED ORDER — PANTOPRAZOLE SODIUM 40 MG PO TBEC
40.0000 mg | DELAYED_RELEASE_TABLET | Freq: Every day | ORAL | Status: DC
  Administered 2017-06-24 – 2017-06-27 (×4): 40 mg via ORAL
  Filled 2017-06-23 (×4): qty 1

## 2017-06-23 MED ORDER — OMEPRAZOLE MAGNESIUM 20 MG PO TBEC: 40.0000 mg | DELAYED_RELEASE_TABLET | Freq: Every day | ORAL | Status: DC

## 2017-06-23 MED ORDER — SODIUM CHLORIDE 0.9 % IV BOLUS (SEPSIS)
1000.0000 mL | Freq: Once | INTRAVENOUS | Status: AC
  Administered 2017-06-23: 1000 mL via INTRAVENOUS

## 2017-06-23 MED ORDER — ATORVASTATIN CALCIUM 20 MG PO TABS
20.0000 mg | ORAL_TABLET | Freq: Every day | ORAL | Status: DC
  Administered 2017-06-24 – 2017-06-26 (×3): 20 mg via ORAL
  Filled 2017-06-23 (×3): qty 1

## 2017-06-23 MED ORDER — HYDROCODONE-ACETAMINOPHEN 5-325 MG PO TABS
1.0000 | ORAL_TABLET | Freq: Four times a day (QID) | ORAL | Status: DC | PRN
  Administered 2017-06-23 – 2017-06-27 (×10): 1 via ORAL
  Filled 2017-06-23 (×9): qty 1

## 2017-06-23 MED ORDER — ACETAMINOPHEN 650 MG RE SUPP: 650.0000 mg | Freq: Four times a day (QID) | RECTAL | Status: DC | PRN

## 2017-06-23 MED ORDER — ALPRAZOLAM 0.25 MG PO TABS
0.5000 mg | ORAL_TABLET | Freq: Three times a day (TID) | ORAL | Status: DC
  Administered 2017-06-23 – 2017-06-27 (×11): 0.5 mg via ORAL
  Filled 2017-06-23 (×11): qty 2

## 2017-06-23 MED ORDER — PIPERACILLIN-TAZOBACTAM 3.375 G IVPB 30 MIN
3.3750 g | Freq: Once | INTRAVENOUS | Status: AC
  Administered 2017-06-23: 3.375 g via INTRAVENOUS
  Filled 2017-06-23: qty 50

## 2017-06-23 MED ORDER — PIPERACILLIN-TAZOBACTAM 3.375 G IVPB: 3.3750 g | Freq: Three times a day (TID) | INTRAVENOUS | Status: DC

## 2017-06-23 MED ORDER — VANCOMYCIN HCL IN DEXTROSE 1-5 GM/200ML-% IV SOLN: 1000.0000 mg | Freq: Once | INTRAVENOUS | Status: DC

## 2017-06-23 MED ORDER — ONDANSETRON HCL 4 MG PO TABS: 4.0000 mg | ORAL_TABLET | Freq: Four times a day (QID) | ORAL | Status: DC | PRN

## 2017-06-23 MED ORDER — ALLOPURINOL 100 MG PO TABS
100.0000 mg | ORAL_TABLET | Freq: Every day | ORAL | Status: DC
  Administered 2017-06-24 – 2017-06-27 (×4): 100 mg via ORAL
  Filled 2017-06-23 (×4): qty 1

## 2017-06-23 MED ORDER — VANCOMYCIN HCL 10 G IV SOLR
1500.0000 mg | Freq: Once | INTRAVENOUS | Status: AC
  Administered 2017-06-23: 1500 mg via INTRAVENOUS
  Filled 2017-06-23: qty 1500

## 2017-06-23 MED ORDER — SODIUM CHLORIDE 0.9 % IV BOLUS (SEPSIS): 500.0000 mL | Freq: Once | INTRAVENOUS | Status: DC

## 2017-06-23 MED ORDER — HYDROMORPHONE HCL 1 MG/ML IJ SOLN
0.5000 mg | Freq: Once | INTRAMUSCULAR | Status: AC
  Administered 2017-06-23: 0.5 mg via INTRAVENOUS
  Filled 2017-06-23: qty 1

## 2017-06-23 NOTE — ED Provider Notes (Signed)
[facility] EMERGENCY DEPARTMENT Provider Note   CSN: [id number] Arrival date & time: 06/23/17  1433     History   Chief Complaint Chief Complaint  Patient presents with  . Animal Bite    HPI Aaron Black is a 61 y.o. male.  The history is provided by the patient and medical records. No language interpreter was used.   Aaron Black is a 61 y.o. male  with a PMH of HTN, HLD, gout who presents to the Emergency Department complaining of cat scratch to the right forearm. Patient states that he was scratched by his own kitten who is about 19 months old. The cat is fully vaccinated. Yesterday, it was just a small scratch to the forearm, however when he awoke this morning, his arm was red, swollen and painful. As the day progressed, redness began streaking up his arm and arm became much more painful. Denies fever or chills. Tetanus is up-to-date.  Past Medical History:  Diagnosis Date  . Anemia   . Anxiety   . Arthritis   . B12 deficiency   . Cervical disc disease    a. 07/2003 ant cervical deomcpression and fusion C5-6/C6-7;  b. 09/2007 post cervical laminectomy C4-5 with scres and arthrodesis C4-C7.  Marland Kitchen Chest pain at rest 02/25/2013  . Chronic lower back pain   . Complication of anesthesia    " i WOKE UP DURING A COLONOSCOPY "- 2008  . Depression   . Diverticulosis of colon   . GERD (gastroesophageal reflux disease)   . Gout   . Hyperlipidemia   . Hypertension   . Pleurisy   . Shortness of breath dyspnea    with exertion- "I cant exercise now"  . Sleep apnea    Patient denies  . Tobacco abuse    a. 40 yrs, 1.5-3 ppd over that time.    Patient Active Problem List   Diagnosis Date Noted  . Pseudoarthrosis of lumbar spine 01/21/2016  . Lumbar spine scoliosis 06/05/2014  . Herniated lumbar disc without myelopathy 07/11/2013  . Chest pain at rest 02/26/2013  . Tobacco abuse 09/25/2011  . Gout 05/27/2009  . VITAMIN B12 DEFICIENCY 09/08/2008  . HLD  (hyperlipidemia) 09/08/2008  . COLONIC POLYPS, HX OF 09/08/2008  . Anemia, unspecified 06/30/2008  . ABDOMINAL PAIN-LLQ 06/30/2008  . ANXIETY 06/29/2008  . Essential hypertension 06/29/2008  . GERD 06/29/2008  . DIVERTICULOSIS, COLON 06/29/2008  . ARTHRITIS 06/29/2008  . HERNIATED DISC 06/29/2008    Past Surgical History:  Procedure Laterality Date  . ANTERIOR LATERAL LUMBAR FUSION 4 LEVELS Right 06/05/2014   Procedure: ANTERIOR LATERAL LUMBAR FUSION  LUMBAR ONE TO LUMBAR FIVE with Percutaneous Pedicle Screws;  Surgeon: Erline Levine, MD;  Location: [facility];  Service: Neurosurgery;  Laterality: Right;  . CARPAL TUNNEL RELEASE Right   . CERVICAL DISC SURGERY     X 2  . COLONOSCOPY    . ELBOW ARTHROSCOPY Right   . KNEE ARTHROSCOPY Right 08/2013   torn menicus  . KNEE ARTHROSCOPY Left 11/2013   chip cartlidge,torn menicus  . LUMBAR LAMINECTOMY/DECOMPRESSION MICRODISCECTOMY Right 07/11/2013   Procedure: LUMBAR LAMINECTOMY/DECOMPRESSION MICRODISCECTOMY 1 LEVEL;  Surgeon: Erline Levine, MD;  Location: [facility];  Service: Neurosurgery;  Laterality: Right;  Right L34 microdiskectomy  . LUMBAR PERCUTANEOUS PEDICLE SCREW 4 LEVEL N/A 06/05/2014   Procedure: LUMBAR PERCUTANEOUS PEDICLE SCREW 4 LEVEL;  Surgeon: Erline Levine, MD;  Location: [facility];  Service: Neurosurgery;  Laterality: N/A;  .  NECK SURGERY  January 2005, March 2009   C-Spine  . ROTATOR CUFF REPAIR  August 2000, January 2002, July 2008, July 2009   2 left 2 right       Home Medications    Prior to Admission medications   Medication Sig Start Date End Date Taking? Authorizing Provider  albuterol (PROVENTIL HFA;VENTOLIN HFA) 108 (90 Base) MCG/ACT inhaler Inhale 2 puffs into the lungs every 6 (six) hours as needed for wheezing or shortness of breath. 03/05/17   Marletta Lor, MD  allopurinol (ZYLOPRIM) 100 MG tablet TAKE 1 TABLET DAILY 07/18/16   Marletta Lor, MD  allopurinol (ZYLOPRIM) 100 MG tablet  TAKE 1 TABLET BY MOUTH EVERY DAY 04/12/17   Marletta Lor, MD  ALPRAZolam Duanne Moron) 0.5 MG tablet TAKE 1 TABLET BY MOUTH 3 TIMES A DAY 03/20/17   Marletta Lor, MD  atorvastatin (LIPITOR) 20 MG tablet TAKE 1 TABLET (20 MG TOTAL) BY MOUTH DAILY. 04/11/17   Marletta Lor, MD  colchicine 0.6 MG tablet Take 1 tablet (0.6 mg total) by mouth daily as needed (GOUT). 02/14/16   Marletta Lor, MD  cyclobenzaprine (FLEXERIL) 10 MG tablet Take 1 tablet (10 mg total) by mouth 2 (two) times daily as needed for muscle spasms. 01/23/16   Kary Kos, MD  diltiazem (CARDIZEM CD) 360 MG 24 hr capsule Take 1 capsule (360 mg total) by mouth daily. 03/07/17   Marletta Lor, MD  HYDROcodone-acetaminophen (NORCO/VICODIN) 5-325 MG per tablet Take 1 tablet by mouth every 6 (six) hours as needed for moderate pain. 07/11/13   Ashok Pall, MD  losartan (COZAAR) 100 MG tablet Take 1 tablet (100 mg total) by mouth daily. 03/05/17   Marletta Lor, MD  omeprazole (PRILOSEC OTC) 20 MG tablet Take 2 tablets (40 mg total) by mouth daily. 02/27/13   Viyuoh, Alison Stalling, MD  sertraline (ZOLOFT) 50 MG tablet TAKE 1 TABLET (50 MG TOTAL) BY MOUTH DAILY. 05/23/17   Marletta Lor, MD  vitamin B-12 (CYANOCOBALAMIN) 1000 MCG tablet Take 1,000 mcg by mouth daily.    [provider]    Family History Family History  Problem Relation Age of Onset  . Bone cancer Mother   . Squamous cell carcinoma Mother        died @ 3  . Heart attack Mother   . Hypertension Father   . Diabetes Father        borderline  . Congestive Heart Failure Father        alive @ 72  . Heart failure Father   . Sudden death Brother        died @ 33  . Other Brother        died in [place] @ 38 - struck by drunk driver  . Other Brother        died in [place] @ 6 - struck by drunk driver  . Heart attack Brother   . Stroke Paternal Grandfather   . Colon cancer Neg Hx   . Esophageal cancer Neg Hx   . Rectal cancer Neg Hx     . Stomach cancer Neg Hx     Social History Social History   Tobacco Use  . Smoking status: Heavy Tobacco Smoker    Packs/day: 0.50    Years: 40.00    Pack years: 20.00    Types: Cigarettes    Last attempt to quit: 02/24/2013    Years since quitting: 4.3  . Smokeless  tobacco: Never Used  . Tobacco comment: Currently smoking 1.5 ppd.  Has previously smoked up to 3 ppd and did so for about 20 yrs.  Substance Use Topics  . Alcohol use: Yes    Comment: at least 3 beers/night.- 01/20/16  . Drug use: No     Allergies   Lisinopril and Percodan [oxycodone-aspirin]   Review of Systems Review of Systems  Musculoskeletal: Positive for myalgias.  Skin: Positive for color change and wound.  All other systems reviewed and are negative.    Physical Exam Updated Vital Signs BP 111/79 (BP Location: Left Arm)   Pulse 77   Temp 97.7 F (36.5 C) (Oral)   Resp 16   SpO2 100%   Physical Exam  Constitutional: He is oriented to person, place, and time. He appears well-developed and well-nourished. No distress.  HENT:  Head: Normocephalic and atraumatic.  Cardiovascular: Normal rate, regular rhythm and normal heart sounds.  No murmur heard. Pulmonary/Chest: Effort normal and breath sounds normal. No respiratory distress.  Abdominal: Soft. He exhibits no distension. There is no tenderness.  Musculoskeletal: He exhibits no edema.  Neurological: He is alert and oriented to person, place, and time.  Skin: Skin is warm and dry.  See imaging below. Right arm with erythema surrounding 1.5 cm scratch from cat which is warm to the touch. Extensive streaking up the right arm all the way to axilla.  Nursing note and vitals reviewed.        ED Treatments / Results  Labs (all labs ordered are listed, but only abnormal results are displayed) Labs Reviewed  COMPREHENSIVE METABOLIC PANEL - Abnormal; Notable for the following components:      Result Value   Sodium 130 (*)    Chloride 100  (*)    CO2 18 (*)    All other components within normal limits  CBC WITH DIFFERENTIAL/PLATELET - Abnormal; Notable for the following components:   WBC 17.2 (*)    Neutro Abs 13.4 (*)    Monocytes Absolute 1.6 (*)    All other components within normal limits  I-STAT CG4 LACTIC ACID, ED - Abnormal; Notable for the following components:   Lactic Acid, Venous 4.13 (*)    All other components within normal limits  CULTURE, BLOOD (ROUTINE X 2)  CULTURE, BLOOD (ROUTINE X 2)  I-STAT CG4 LACTIC ACID, ED    EKG  EKG Interpretation None       Radiology No results found.  Procedures Procedures (including critical care time)  CRITICAL CARE Performed by: Ozella Almond Ward   Total critical care time: 35 minutes  Critical care time was exclusive of separately billable procedures and treating other patients.  Critical care was necessary to treat or prevent imminent or life-threatening deterioration.  Critical care was time spent personally by me on the following activities: development of treatment plan with patient and/or surrogate as well as nursing, discussions with consultants, evaluation of patient's response to treatment, examination of patient, obtaining history from patient or surrogate, ordering and performing treatments and interventions, ordering and review of laboratory studies, ordering and review of radiographic studies, pulse oximetry and re-evaluation of patient's condition.   Medications Ordered in ED Medications  sodium chloride 0.9 % bolus 1,000 mL (1,000 mLs Intravenous New Bag/Given 06/23/17 1625)    And  sodium chloride 0.9 % bolus 1,000 mL (1,000 mLs Intravenous New Bag/Given 06/23/17 1635)    And  sodium chloride 0.9 % bolus 500 mL (not administered)  vancomycin (VANCOCIN) IVPB  1000 mg/200 mL premix (not administered)  piperacillin-tazobactam (ZOSYN) IVPB 3.375 g (0 g Intravenous Stopped 06/23/17 1655)  HYDROmorphone (DILAUDID) injection 0.5 mg (0.5 mg  Intravenous Given 06/23/17 1626)     Initial Impression / Assessment and Plan / ED Course  I have reviewed the triage vital signs and the nursing notes.  Pertinent labs & imaging results that were available during my care of the patient were reviewed by me and considered in my medical decision making (see chart for details).    Aaron Black is a 61 y.o. male who presents to ED for cat scratch to the right forearm yesterday from his own fully vaccinated 66 month old kitten. This morning upon awakening, arm was red, swollen and painful. In just a few hours, erythema has begun streaking up the forearm. He is afebrile and hemodynamically stable upon ED arrival, however cellulitis is quite extensive. Will give dose of IV Zosyn and obtain lab work for further evaluation.   Lactic elevated at 4.13 - Code sepsis called. Will obtain blood cultures and start weight-based fluids.  White count of 17.2 with shift of 13.4.   Hospitalist consulted who will admit.   Final Clinical Impressions(s) / ED Diagnoses   Final diagnoses:  Cellulitis of right upper extremity    ED Discharge Orders    None         Ward, Ozella Almond, PA-C 06/23/17 1759    Charlesetta Shanks, MD 06/24/17 [phone]

## 2017-06-23 NOTE — H&P (Signed)
[facility] History and Physical  BLU LORI EXH:[id number] DOB: [birth year] DOA: 06/23/2017  PCP: Marletta Lor, MD  Patient coming from: Home  Chief Complaint: Right arm pain and redness  HPI: Aaron Black is a 61 y.o. male with a medical history of hypertension, tobacco abuse, who presented with complaints of right arm swelling and pain. Patient states he has a 33-month-old kitten that scratched him yesterday. The scratch wad small and noted on his right forearm. When patient awoke this morning, he noted that his arm was swollen and painful and that he noted his redness and streaking coursing up his arm. Patient states as the day has progressed, his arm became more painful and difficult to move. He does state that his kitten has been vaccinated. Denies any fever or chills at home. Denies any recent travel, or illness. Denies any current chest pain, shortness of breath, abdominal pain, nausea vomiting, diarrhea or constipation, dizziness or headache, changes in urinary habits. Patient stated his tetanus shot is up-to-date.  ED Course: Found to have leukocytosis 17.2, lactic acidosis of 4.13. Right upper extremity erythema. Patient started on Zosyn. [facility] called for admission.  Review of Systems:  All other systems reviewed and are negative.   Past Medical History:  Diagnosis Date  . Anemia   . Anxiety   . Arthritis   . B12 deficiency   . Cervical disc disease    a. 07/2003 ant cervical deomcpression and fusion C5-6/C6-7;  b. 09/2007 post cervical laminectomy C4-5 with scres and arthrodesis C4-C7.  Marland Kitchen Chest pain at rest 02/25/2013  . Chronic lower back pain   . Complication of anesthesia    " i WOKE UP DURING A COLONOSCOPY "- 2008  . Depression   . Diverticulosis of colon   . GERD (gastroesophageal reflux disease)   . Gout   . Hyperlipidemia   . Hypertension   . Pleurisy   . Shortness of breath dyspnea    with exertion- "I cant exercise now"  . Sleep apnea    Patient denies  . Tobacco abuse    a. 40 yrs, 1.5-3 ppd over that time.    Past Surgical History:  Procedure Laterality Date  . ANTERIOR LATERAL LUMBAR FUSION 4 LEVELS Right 06/05/2014   Procedure: ANTERIOR LATERAL LUMBAR FUSION  LUMBAR ONE TO LUMBAR FIVE with Percutaneous Pedicle Screws;  Surgeon: Erline Levine, MD;  Location: [place];  Service: Neurosurgery;  Laterality: Right;  . CARPAL TUNNEL RELEASE Right   . CERVICAL DISC SURGERY     X 2  . COLONOSCOPY    . ELBOW ARTHROSCOPY Right   . KNEE ARTHROSCOPY Right 08/2013   torn menicus  . KNEE ARTHROSCOPY Left 11/2013   chip cartlidge,torn menicus  . LUMBAR LAMINECTOMY/DECOMPRESSION MICRODISCECTOMY Right 07/11/2013   Procedure: LUMBAR LAMINECTOMY/DECOMPRESSION MICRODISCECTOMY 1 LEVEL;  Surgeon: Erline Levine, MD;  Location: [facility];  Service: Neurosurgery;  Laterality: Right;  Right L34 microdiskectomy  . LUMBAR PERCUTANEOUS PEDICLE SCREW 4 LEVEL N/A 06/05/2014   Procedure: LUMBAR PERCUTANEOUS PEDICLE SCREW 4 LEVEL;  Surgeon: Erline Levine, MD;  Location: [place] [facility];  Service: Neurosurgery;  Laterality: N/A;  . NECK SURGERY  January 2005, March 2009   C-Spine  . ROTATOR CUFF REPAIR  August 2000, January 2002, July 2008, July 2009   2 left 2 right    Social History:  reports that he has been smoking cigarettes.  He has a 20.00 pack-year smoking history. he has never used smokeless tobacco. He  reports that he drinks alcohol. He reports that he does not use drugs.  Allergies  Allergen Reactions  . Lisinopril Swelling    Swollen Lip  . Percodan [Oxycodone-Aspirin] Rash    Percodan caused rash---but he can tolerate Percocet and aspirin on their own     Family History  Problem Relation Age of Onset  . Bone cancer Mother   . Squamous cell carcinoma Mother        died @ 75  . Heart attack Mother   . Hypertension Father   . Diabetes Father        borderline  . Congestive Heart Failure Father        alive @ 46  . Heart  failure Father   . Sudden death Brother        died @ 8  . Other Brother        died in [place] @ 68 - struck by drunk driver  . Other Brother        died in [place] @ 38 - struck by drunk driver  . Heart attack Brother   . Stroke Paternal Grandfather   . Colon cancer Neg Hx   . Esophageal cancer Neg Hx   . Rectal cancer Neg Hx   . Stomach cancer Neg Hx      Prior to Admission medications   Medication Sig Start Date End Date Taking? Authorizing Provider  albuterol (PROVENTIL HFA;VENTOLIN HFA) 108 (90 Base) MCG/ACT inhaler Inhale 2 puffs into the lungs every 6 (six) hours as needed for wheezing or shortness of breath. 03/05/17   Marletta Lor, MD  allopurinol (ZYLOPRIM) 100 MG tablet TAKE 1 TABLET DAILY 07/18/16   Marletta Lor, MD  allopurinol (ZYLOPRIM) 100 MG tablet TAKE 1 TABLET BY MOUTH EVERY DAY 04/12/17   Marletta Lor, MD  ALPRAZolam Duanne Moron) 0.5 MG tablet TAKE 1 TABLET BY MOUTH 3 TIMES A DAY 03/20/17   Marletta Lor, MD  atorvastatin (LIPITOR) 20 MG tablet TAKE 1 TABLET (20 MG TOTAL) BY MOUTH DAILY. 04/11/17   Marletta Lor, MD  colchicine 0.6 MG tablet Take 1 tablet (0.6 mg total) by mouth daily as needed (GOUT). 02/14/16   Marletta Lor, MD  cyclobenzaprine (FLEXERIL) 10 MG tablet Take 1 tablet (10 mg total) by mouth 2 (two) times daily as needed for muscle spasms. 01/23/16   Kary Kos, MD  diltiazem (CARDIZEM CD) 360 MG 24 hr capsule Take 1 capsule (360 mg total) by mouth daily. 03/07/17   Marletta Lor, MD  HYDROcodone-acetaminophen (NORCO/VICODIN) 5-325 MG per tablet Take 1 tablet by mouth every 6 (six) hours as needed for moderate pain. 07/11/13   Ashok Pall, MD  losartan (COZAAR) 100 MG tablet Take 1 tablet (100 mg total) by mouth daily. 03/05/17   Marletta Lor, MD  omeprazole (PRILOSEC OTC) 20 MG tablet Take 2 tablets (40 mg total) by mouth daily. 02/27/13   Viyuoh, Alison Stalling, MD  sertraline (ZOLOFT) 50 MG tablet TAKE 1 TABLET (50  MG TOTAL) BY MOUTH DAILY. 05/23/17   Marletta Lor, MD  vitamin B-12 (CYANOCOBALAMIN) 1000 MCG tablet Take 1,000 mcg by mouth daily.    [provider]    Physical Exam: Vitals:   06/23/17 1502  BP: 111/79  Pulse: 77  Resp: 16  Temp: 97.7 F (36.5 C)  SpO2: 100%     General: Well developed, well nourished, NAD, appears stated age  HEENT: NCAT, PERRLA, EOMI, Anicteic Sclera, mucous  membranes moist.   Neck: Supple, no JVD, no masses  Cardiovascular: S1 S2 auscultated, no rubs, murmurs or gallops. Regular rate and rhythm.  Respiratory: Clear to auscultation bilaterally with equal chest rise  Abdomen: Soft, nontender, nondistended, + bowel sounds  Extremities: warm dry without cyanosis clubbing or edema of lower extremity. Marked erythema and edema of the right upper extremity starting at the forearm and extending to the axilla, also tender to palpation.  Neuro: AAOx3, cranial nerves grossly intact. Strength 5/5 in patient's upper and lower extremities bilaterally  Skin: Without rashes exudates or nodules  Psych: Normal affect and demeanor with intact judgement and insight  Labs on Admission: I have personally reviewed following labs and imaging studies CBC: Recent Labs  Lab 06/23/17 1548  WBC 17.2*  NEUTROABS 13.4*  HGB 14.9  HCT 43.2  MCV 93.5  PLT 081   Basic Metabolic Panel: Recent Labs  Lab 06/23/17 1548  NA 130*  K 4.4  CL 100*  CO2 18*  GLUCOSE 85  BUN 16  CREATININE 1.09  CALCIUM 9.1   GFR: CrCl cannot be calculated (Unknown ideal weight.). Liver Function Tests: Recent Labs  Lab 06/23/17 1548  AST 37  ALT 28  ALKPHOS 79  BILITOT 1.1  PROT 7.4  ALBUMIN 4.5   No results for input(s): LIPASE, AMYLASE in the last 168 hours. No results for input(s): AMMONIA in the last 168 hours. Coagulation Profile: No results for input(s): INR, PROTIME in the last 168 hours. Cardiac Enzymes: No results for input(s): CKTOTAL, CKMB,  CKMBINDEX, TROPONINI in the last 168 hours. BNP (last 3 results) No results for input(s): PROBNP in the last 8760 hours. HbA1C: No results for input(s): HGBA1C in the last 72 hours. CBG: No results for input(s): GLUCAP in the last 168 hours. Lipid Profile: No results for input(s): CHOL, HDL, LDLCALC, TRIG, CHOLHDL, LDLDIRECT in the last 72 hours. Thyroid Function Tests: No results for input(s): TSH, T4TOTAL, FREET4, T3FREE, THYROIDAB in the last 72 hours. Anemia Panel: No results for input(s): VITAMINB12, FOLATE, FERRITIN, TIBC, IRON, RETICCTPCT in the last 72 hours. Urine analysis:    Component Value Date/Time   COLORURINE AMBER (A) 07/31/2014 2129   APPEARANCEUR CLOUDY (A) 07/31/2014 2129   LABSPEC 1.025 07/31/2014 2129   PHURINE 5.0 07/31/2014 2129   GLUCOSEU NEGATIVE 07/31/2014 2129   HGBUR NEGATIVE 07/31/2014 2129   BILIRUBINUR n 06/27/2016 1524   KETONESUR 15 (A) 07/31/2014 2129   PROTEINUR n 06/27/2016 1524   PROTEINUR NEGATIVE 07/31/2014 2129   UROBILINOGEN 0.2 06/27/2016 1524   UROBILINOGEN 0.2 07/31/2014 2129   NITRITE n 06/27/2016 1524   NITRITE NEGATIVE 07/31/2014 2129   LEUKOCYTESUR Negative 06/27/2016 1524   Sepsis Labs: @LABRCNTIP (procalcitonin:4,lacticidven:4) )No results found for this or any previous visit (from the past 240 hour(s)).   Radiological Exams on Admission: No results found.  EKG: None  Assessment/Plan Early sepsis secondary to right upper extremity cellulitis secondary to cat scratch -Patient presented with leukocytosis and elevated lactic acid -patient with very extensive and progressive cellulitis starting on the right forearm and extending to the right axilla- area has been marked -Will place patient on vancomycin and Zosyn per sepsis protocol -Blood cultures pending -Continue IV fluids -Will continue to monitor lactic acid level -Continue pain control -If no improvement with antibiotics, will obtain CT arm  Essential  hypertension -Continue Cardizem, losartan  Gout -Continue allopurinol, hold colchicine  Hyperlipidemia -Continue statin  Anxiety/depression -Continue Xanax, Zoloft  GERD -Continue PPI  Tobacco abuse -Discuss  cessation -Refused Nicotine patch   Chronic back pain -continue home medications  DVT prophylaxis: Lovenox  Code Status: Full  Family Communication: Wife at bedside. Admission, patients condition and plan of care including tests being ordered have been discussed with the patient and wife who indicate understanding and agree with the plan and Code Status.  Disposition Plan: Home  Consults called: None  Admission status: Admitted. Patient with extensive cellulitis extending the right upper extremity. Patient with elevated lactic acid over 4, and leukocytosis of greater than 17. Suspect patient will need IV antibiotics for more than 48 hours along with pain control.  Time spent: 70 minutes  Khyler Urda D.O. [facility] Pager [phone]  If 7PM-7AM, please contact night-coverage [email or web address] Password [facility] 06/23/2017, 5:17 PM

## 2017-06-23 NOTE — ED Triage Notes (Addendum)
Pt has scratch from his kitten on the right arm. The forearm is swollen. He has streaks all the way up his arm to his bicep and under his axilla. He reports severe pain. Swelling noted to the forearm. Afebrile in triage. Arm marked with marker

## 2017-06-23 NOTE — Progress Notes (Signed)
Patient admitted to room 5N29.  Staff introduced and unit routine reviewed.  Bed in low position, call light within reach, and supper ordered for patient.

## 2017-06-23 NOTE — ED Notes (Signed)
Attempted report 

## 2017-06-23 NOTE — Progress Notes (Addendum)
Pharmacy Antibiotic Note  Aaron Black is a 61 y.o. male admitted on 06/23/2017 with cat scratch on R forearm with surrounding erythema and edema. Planning to start empiric antibiotics for possible early sepsis from cellulitis. WBC 17.2, afebrile.  Nurse had already hung the vancomycin 1 g, I spoke with the nurse, it had only been running for ~5 min, I asked her to stop the infusion so we can hang the 1500 mg.    Plan: -Vancomcyin 1500 mg IV x1 then vancomycin 1g/12h -Zosyn 3.375 g IV q8h -Monitor renal fx, cultures, VT as needed     Temp (24hrs), Avg:97.7 F (36.5 C), Min:97.7 F (36.5 C), Max:97.7 F (36.5 C)  Recent Labs  Lab 06/23/17 1548 06/23/17 1556  WBC 17.2*  --   CREATININE 1.09  --   LATICACIDVEN  --  4.13*    CrCl cannot be calculated (Unknown ideal weight.).    Allergies  Allergen Reactions  . Lisinopril Swelling    Swollen Lip  . Percodan [Oxycodone-Aspirin] Rash    Percodan caused rash---but he can tolerate Percocet and aspirin on their own     Antimicrobials this admission: 12/1 vancomycin > 12/1 zosyn >  Dose adjustments this admission: N/A  Microbiology results: 12/1 blood cx:   Aaron Black 06/23/2017 5:30 PM

## 2017-06-24 ENCOUNTER — Other Ambulatory Visit: Payer: Self-pay

## 2017-06-24 DIAGNOSIS — M1000 Idiopathic gout, unspecified site: Secondary | ICD-10-CM | POA: Diagnosis not present

## 2017-06-24 DIAGNOSIS — A419 Sepsis, unspecified organism: Secondary | ICD-10-CM

## 2017-06-24 DIAGNOSIS — L03113 Cellulitis of right upper limb: Secondary | ICD-10-CM | POA: Diagnosis not present

## 2017-06-24 DIAGNOSIS — E785 Hyperlipidemia, unspecified: Secondary | ICD-10-CM | POA: Diagnosis not present

## 2017-06-24 DIAGNOSIS — Z720 Tobacco use: Secondary | ICD-10-CM | POA: Diagnosis not present

## 2017-06-24 DIAGNOSIS — I10 Essential (primary) hypertension: Secondary | ICD-10-CM | POA: Diagnosis not present

## 2017-06-24 LAB — CBC
HCT: 38.6 % — ABNORMAL LOW (ref 39.0–52.0)
HEMOGLOBIN: 12.7 g/dL — AB (ref 13.0–17.0)
MCH: 30.8 pg (ref 26.0–34.0)
MCHC: 32.9 g/dL (ref 30.0–36.0)
MCV: 93.5 fL (ref 78.0–100.0)
PLATELETS: 152 10*3/uL (ref 150–400)
RBC: 4.13 MIL/uL — AB (ref 4.22–5.81)
RDW: 13.5 % (ref 11.5–15.5)
WBC: 9.4 10*3/uL (ref 4.0–10.5)

## 2017-06-24 LAB — BASIC METABOLIC PANEL
ANION GAP: 8 (ref 5–15)
BUN: 11 mg/dL (ref 6–20)
CALCIUM: 8.5 mg/dL — AB (ref 8.9–10.3)
CO2: 20 mmol/L — AB (ref 22–32)
CREATININE: 1.07 mg/dL (ref 0.61–1.24)
Chloride: 108 mmol/L (ref 101–111)
Glucose, Bld: 79 mg/dL (ref 65–99)
Potassium: 3.8 mmol/L (ref 3.5–5.1)
Sodium: 136 mmol/L (ref 135–145)

## 2017-06-24 LAB — HIV ANTIBODY (ROUTINE TESTING W REFLEX): HIV SCREEN 4TH GENERATION: NONREACTIVE

## 2017-06-24 NOTE — Progress Notes (Signed)
PROGRESS NOTE    Aaron Black  DDU:[id number] DOB: 01/21/1956 DOA: 06/23/2017 PCP: Marletta Lor, MD   Chief Complaint  Patient presents with  . Animal Bite    Brief Narrative:  HPI On 06/23/2017  Aaron Black is a 61 y.o. male with a medical history of hypertension, tobacco abuse, who presented with complaints of right arm swelling and pain. Patient states he has a 39-month-old kitten that scratched him yesterday. The scratch wad small and noted on his right forearm. When patient awoke this morning, he noted that his arm was swollen and painful and that he noted his redness and streaking coursing up his arm. Patient states as the day has progressed, his arm became more painful and difficult to move. He does state that his kitten has been vaccinated. Denies any fever or chills at home. Denies any recent travel, or illness. Denies any current chest pain, shortness of breath, abdominal pain, nausea vomiting, diarrhea or constipation, dizziness or headache, changes in urinary habits. Patient stated his tetanus shot is up-to-date.  Assessment & Plan   Sepsis secondary to right upper extremity cellulitis secondary to cat scratch -Patient presented with leukocytosis and elevated lactic acid -developed fever of 100.21F overnight -Erythema and edema continue to extend on patient's upper arm and worsen in the axilla, extending past marked area -Continue vancomycin and zosyn, IV Fluids -Blood cultures pending -lactic acidosis improving -Continue pain control  Essential hypertension -Continue Cardizem, losartan  Gout -Continue allopurinol, hold colchicine  Hyperlipidemia -Continue statin  Anxiety/depression -Continue Xanax, Zoloft  GERD -Continue PPI  Tobacco abuse -Discuss cessation -Refused Nicotine patch   Chronic back pain -continue home medications  DVT Prophylaxis  Lovenox  Code Status: Full  Family Communication: none at bedside  Disposition Plan:  admitted  Consultants None  Procedures  none  Antibiotics   Anti-infectives (From admission, onward)   Start     Dose/Rate Route Frequency Ordered Stop   06/24/17 2200  piperacillin-tazobactam (ZOSYN) IVPB 3.375 g  Status:  Discontinued     3.375 g 12.5 mL/hr over 240 Minutes Intravenous Every 8 hours 06/23/17 1735 06/23/17 1912   06/24/17 0600  vancomycin (VANCOCIN) IVPB 1000 mg/200 mL premix     1,000 mg 200 mL/hr over 60 Minutes Intravenous Every 12 hours 06/23/17 1735     06/23/17 1912  piperacillin-tazobactam (ZOSYN) IVPB 3.375 g     3.375 g 12.5 mL/hr over 240 Minutes Intravenous Every 8 hours 06/23/17 1912     06/23/17 1900  piperacillin-tazobactam (ZOSYN) IVPB 3.375 g  Status:  Discontinued     3.375 g 100 mL/hr over 30 Minutes Intravenous  Once 06/23/17 1847 06/23/17 1853   06/23/17 1900  vancomycin (VANCOCIN) IVPB 1000 mg/200 mL premix  Status:  Discontinued     1,000 mg 200 mL/hr over 60 Minutes Intravenous  Once 06/23/17 1847 06/23/17 1853   06/23/17 1800  vancomycin (VANCOCIN) 1,500 mg in sodium chloride 0.9 % 500 mL IVPB     1,500 mg 250 mL/hr over 120 Minutes Intravenous  Once 06/23/17 1735 06/23/17 2000   06/23/17 1700  vancomycin (VANCOCIN) IVPB 1000 mg/200 mL premix  Status:  Discontinued     1,000 mg 200 mL/hr over 60 Minutes Intravenous  Once 06/23/17 1649 06/23/17 1736   06/23/17 1615  piperacillin-tazobactam (ZOSYN) IVPB 3.375 g     3.375 g 100 mL/hr over 30 Minutes Intravenous  Once 06/23/17 1604 06/23/17 1655      Subjective:   Aaron Black seen  and examined today. Continues to feel pain and swelling in right arm. Denies chest pain, shortness of breath, abdominal pain, nausea vomiting, diarrhea or constipation, dizziness or headache.  Objective:   Vitals:   06/23/17 2343 06/23/17 2349 06/24/17 0418 06/24/17 0857  BP: 135/68  (!) 149/72 (!) 162/81  Pulse: 80  65 84  Resp: 17  13 18   Temp: 98.2 F (36.8 C) 98.6 F (37 C) 97.8 F (36.6 C) 97.9  F (36.6 C)  TempSrc: Oral Axillary Oral Oral  SpO2: 97%  98% 100%  Weight:        Intake/Output Summary (Last 24 hours) at 06/24/2017 1336 Last data filed at 06/24/2017 1130 Gross per 24 hour  Intake 4710 ml  Output 5625 ml  Net -915 ml   Filed Weights   06/23/17 1721  Weight: 79.4 kg (175 lb)    Exam  General: Well developed, well nourished, NAD, appears stated age  HEENT: NCAT, mucous membranes moist.   Cardiovascular: S1 S2 auscultated, no rubs, murmurs or gallops. Regular rate and rhythm.  Respiratory: Clear to auscultation bilaterally with equal chest rise  Abdomen: Soft, nontender, nondistended, + bowel sounds  Extremities: warm dry without cyanosis clubbing or edema. RUE erythema and edema extending from the forearm to the axilla.  Neuro: AAOx3, nonfocal  Psych: Normal affect and demeanor, pleasant   Data Reviewed: I have personally reviewed following labs and imaging studies  CBC: Recent Labs  Lab 06/23/17 1548 06/23/17 1911 06/24/17 0512  WBC 17.2* 12.7* 9.4  NEUTROABS 13.4*  --   --   HGB 14.9 12.0* 12.7*  HCT 43.2 35.2* 38.6*  MCV 93.5 93.1 93.5  PLT 201 188 517   Basic Metabolic Panel: Recent Labs  Lab 06/23/17 1548 06/23/17 1911 06/24/17 0512  NA 130*  --  136  K 4.4  --  3.8  CL 100*  --  108  CO2 18*  --  20*  GLUCOSE 85  --  79  BUN 16  --  11  CREATININE 1.09 1.08 1.07  CALCIUM 9.1  --  8.5*   GFR: Estimated Creatinine Clearance: 75.1 mL/min (by C-G formula based on SCr of 1.07 mg/dL). Liver Function Tests: Recent Labs  Lab 06/23/17 1548  AST 37  ALT 28  ALKPHOS 79  BILITOT 1.1  PROT 7.4  ALBUMIN 4.5   No results for input(s): LIPASE, AMYLASE in the last 168 hours. No results for input(s): AMMONIA in the last 168 hours. Coagulation Profile: No results for input(s): INR, PROTIME in the last 168 hours. Cardiac Enzymes: No results for input(s): CKTOTAL, CKMB, CKMBINDEX, TROPONINI in the last 168 hours. BNP (last 3  results) No results for input(s): PROBNP in the last 8760 hours. HbA1C: No results for input(s): HGBA1C in the last 72 hours. CBG: No results for input(s): GLUCAP in the last 168 hours. Lipid Profile: No results for input(s): CHOL, HDL, LDLCALC, TRIG, CHOLHDL, LDLDIRECT in the last 72 hours. Thyroid Function Tests: No results for input(s): TSH, T4TOTAL, FREET4, T3FREE, THYROIDAB in the last 72 hours. Anemia Panel: No results for input(s): VITAMINB12, FOLATE, FERRITIN, TIBC, IRON, RETICCTPCT in the last 72 hours. Urine analysis:    Component Value Date/Time   COLORURINE AMBER (A) 07/31/2014 2129   APPEARANCEUR CLOUDY (A) 07/31/2014 2129   LABSPEC 1.025 07/31/2014 2129   PHURINE 5.0 07/31/2014 2129   GLUCOSEU NEGATIVE 07/31/2014 2129   HGBUR NEGATIVE 07/31/2014 2129   BILIRUBINUR n 06/27/2016 1524   KETONESUR 15 (A) 07/31/2014  2129   PROTEINUR n 06/27/2016 1524   PROTEINUR NEGATIVE 07/31/2014 2129   UROBILINOGEN 0.2 06/27/2016 1524   UROBILINOGEN 0.2 07/31/2014 2129   NITRITE n 06/27/2016 1524   NITRITE NEGATIVE 07/31/2014 2129   LEUKOCYTESUR Negative 06/27/2016 1524   Sepsis Labs: @LABRCNTIP (procalcitonin:4,lacticidven:4)  ) Recent Results (from the past 240 hour(s))  Blood Culture (routine x 2)     Status: None (Preliminary result)   Collection Time: 06/23/17  4:04 PM  Result Value Ref Range Status   Specimen Description BLOOD LEFT ANTECUBITAL  Final   Special Requests   Final    BOTTLES DRAWN AEROBIC AND ANAEROBIC Blood Culture adequate volume   Culture PENDING  Incomplete   Report Status PENDING  Incomplete      Radiology Studies: Dg Chest Port 1 View  Result Date: 06/23/2017 CLINICAL DATA:  61 year old male with sepsis. EXAM: PORTABLE CHEST 1 VIEW COMPARISON:  09/13/2016 CT, 02/26/2013 chest radiograph prior studies FINDINGS: The cardiomediastinal silhouette is unremarkable. Mild left basilar scarring again noted. There is no evidence of focal airspace disease,  pulmonary edema, suspicious pulmonary nodule/mass, pleural effusion, or pneumothorax. No acute bony abnormalities are identified. Lower cervical fusion changes again noted. IMPRESSION: No evidence of acute cardiopulmonary disease. Electronically Signed   By: Margarette Canada M.D.   On: 06/23/2017 19:14     Scheduled Meds: . allopurinol  100 mg Oral Daily  . ALPRAZolam  0.5 mg Oral TID  . atorvastatin  20 mg Oral q1800  . diltiazem  360 mg Oral Daily  . enoxaparin (LOVENOX) injection  40 mg Subcutaneous Q24H  . losartan  100 mg Oral Daily  . pantoprazole  40 mg Oral Daily  . sertraline  50 mg Oral Daily  . vitamin B-12  1,000 mcg Oral Daily   Continuous Infusions: . sodium chloride 75 mL/hr at 06/24/17 0830  . piperacillin-tazobactam (ZOSYN)  IV Stopped (06/24/17 1131)  . sodium chloride    . vancomycin Stopped (06/24/17 7262)     LOS: 1 day   Time Spent in minutes   30 minutes  Deborah Dondero D.O. on 06/24/2017 at 1:36 PM  Between 7am to 7pm - Pager - [phone]  After 7pm go to [email or web address] - password [email or web address]  And look for the night coverage person covering for me after hours  [facility] Office  [phone]

## 2017-06-24 NOTE — Progress Notes (Signed)
Patient noted with temp of 100 at shift change. Pt in no acute distress and all other vitals wnl. Prn tylenol given and norco for pain. Rechecked temp a hr later and patient at the time noted to be very clammy and sweating beads. Patients temp at 100.4 but remaining alert and oriented. Gave ice packs and cold wash cloth to head with nsaid as well. Rechecked temp and all other vitals with temp noted to be 99.4 in axillary and patient currently not clammy or sweating at this time. Will continue to monitor vitals and patients condition

## 2017-06-25 ENCOUNTER — Inpatient Hospital Stay (HOSPITAL_COMMUNITY): Payer: 59

## 2017-06-25 DIAGNOSIS — A419 Sepsis, unspecified organism: Secondary | ICD-10-CM | POA: Diagnosis not present

## 2017-06-25 DIAGNOSIS — Z720 Tobacco use: Secondary | ICD-10-CM | POA: Diagnosis not present

## 2017-06-25 DIAGNOSIS — I10 Essential (primary) hypertension: Secondary | ICD-10-CM | POA: Diagnosis not present

## 2017-06-25 DIAGNOSIS — L03113 Cellulitis of right upper limb: Secondary | ICD-10-CM | POA: Diagnosis not present

## 2017-06-25 DIAGNOSIS — E785 Hyperlipidemia, unspecified: Secondary | ICD-10-CM | POA: Diagnosis not present

## 2017-06-25 DIAGNOSIS — M1000 Idiopathic gout, unspecified site: Secondary | ICD-10-CM | POA: Diagnosis not present

## 2017-06-25 LAB — CBC
HCT: 36 % — ABNORMAL LOW (ref 39.0–52.0)
HEMOGLOBIN: 11.7 g/dL — AB (ref 13.0–17.0)
MCH: 30.7 pg (ref 26.0–34.0)
MCHC: 32.5 g/dL (ref 30.0–36.0)
MCV: 94.5 fL (ref 78.0–100.0)
Platelets: 165 10*3/uL (ref 150–400)
RBC: 3.81 MIL/uL — ABNORMAL LOW (ref 4.22–5.81)
RDW: 13.4 % (ref 11.5–15.5)
WBC: 8 10*3/uL (ref 4.0–10.5)

## 2017-06-25 LAB — BASIC METABOLIC PANEL
Anion gap: 6 (ref 5–15)
BUN: 6 mg/dL (ref 6–20)
CALCIUM: 8.7 mg/dL — AB (ref 8.9–10.3)
CHLORIDE: 107 mmol/L (ref 101–111)
CO2: 23 mmol/L (ref 22–32)
CREATININE: 0.96 mg/dL (ref 0.61–1.24)
GFR calc Af Amer: >60 mL/min (ref >60)
GFR calc non Af Amer: >60 mL/min (ref >60)
GLUCOSE: 87 mg/dL (ref 65–99)
POTASSIUM: 3.8 mmol/L (ref 3.5–5.1)
SODIUM: 136 mmol/L (ref 135–145)

## 2017-06-25 LAB — CG4 I-STAT (LACTIC ACID): Lactic Acid, Venous: 3.43 mmol/L (ref 0.5–1.9)

## 2017-06-25 MED ORDER — IOPAMIDOL (ISOVUE-300) INJECTION 61%
INTRAVENOUS | Status: AC
  Administered 2017-06-25: 100 mL via INTRAVENOUS
  Filled 2017-06-25: qty 100

## 2017-06-25 NOTE — Progress Notes (Signed)
PROGRESS NOTE    Aaron Black  OVF:[id number] DOB: 09-19-1955 DOA: 06/23/2017 PCP: Marletta Lor, MD   Chief Complaint  Patient presents with  . Animal Bite    Brief Narrative:  HPI On 06/23/2017  Aaron Black is a 61 y.o. male with a medical history of hypertension, tobacco abuse, who presented with complaints of right arm swelling and pain. Patient states he has a 75-month-old kitten that scratched him yesterday. The scratch wad small and noted on his right forearm. When patient awoke this morning, he noted that his arm was swollen and painful and that he noted his redness and streaking coursing up his arm. Patient states as the day has progressed, his arm became more painful and difficult to move. He does state that his kitten has been vaccinated. Denies any fever or chills at home. Denies any recent travel, or illness. Denies any current chest pain, shortness of breath, abdominal pain, nausea vomiting, diarrhea or constipation, dizziness or headache, changes in urinary habits. Patient stated his tetanus shot is up-to-date.  Assessment & Plan   Sepsis secondary to right upper extremity cellulitis secondary to cat scratch -Patient presented with leukocytosis and elevated lactic acid -developed fever of 100.7F during hospitalization -Erythema and edema extended to patient's axilla, now regressing, however, more erythema on forearm with swollen axillary lymph nodes  -Continue vancomycin and zosyn, IV Fluids -Blood cultures show no growth -lactic acidosis resolved -Continue pain control -will obtain CT of the arm  Essential hypertension -Continue Cardizem, losartan  Gout -Continue allopurinol, hold colchicine  Hyperlipidemia -Continue statin  Anxiety/depression -Continue Xanax, Zoloft  GERD -Continue PPI  Tobacco abuse -Discuss cessation -Refused Nicotine patch   Chronic back pain -continue home medications  DVT Prophylaxis  Lovenox  Code Status:  Full  Family Communication: none at bedside  Disposition Plan: admitted, continue IV antibiotics. Home when medically stable  Consultants None  Procedures  none  Antibiotics   Anti-infectives (From admission, onward)   Start     Dose/Rate Route Frequency Ordered Stop   06/24/17 2200  piperacillin-tazobactam (ZOSYN) IVPB 3.375 g  Status:  Discontinued     3.375 g 12.5 mL/hr over 240 Minutes Intravenous Every 8 hours 06/23/17 1735 06/23/17 1912   06/24/17 0600  vancomycin (VANCOCIN) IVPB 1000 mg/200 mL premix     1,000 mg 200 mL/hr over 60 Minutes Intravenous Every 12 hours 06/23/17 1735     06/23/17 1912  piperacillin-tazobactam (ZOSYN) IVPB 3.375 g     3.375 g 12.5 mL/hr over 240 Minutes Intravenous Every 8 hours 06/23/17 1912     06/23/17 1900  piperacillin-tazobactam (ZOSYN) IVPB 3.375 g  Status:  Discontinued     3.375 g 100 mL/hr over 30 Minutes Intravenous  Once 06/23/17 1847 06/23/17 1853   06/23/17 1900  vancomycin (VANCOCIN) IVPB 1000 mg/200 mL premix  Status:  Discontinued     1,000 mg 200 mL/hr over 60 Minutes Intravenous  Once 06/23/17 1847 06/23/17 1853   06/23/17 1800  vancomycin (VANCOCIN) 1,500 mg in sodium chloride 0.9 % 500 mL IVPB     1,500 mg 250 mL/hr over 120 Minutes Intravenous  Once 06/23/17 1735 06/23/17 2000   06/23/17 1700  vancomycin (VANCOCIN) IVPB 1000 mg/200 mL premix  Status:  Discontinued     1,000 mg 200 mL/hr over 60 Minutes Intravenous  Once 06/23/17 1649 06/23/17 1736   06/23/17 1615  piperacillin-tazobactam (ZOSYN) IVPB 3.375 g     3.375 g 100 mL/hr over 30 Minutes Intravenous  Once 06/23/17 1604 06/23/17 1655      Subjective:   Aaron Black seen and examined today.  Feels areas of redness have improved, but feels some swelling in his arm pit. Denies chest pain, shortness of breath, abdominal pain, N/V/D/C.  Objective:   Vitals:   06/24/17 2110 06/25/17 0500 06/25/17 0800 06/25/17 1300  BP: 139/77 (!) 164/75 (!) 150/74 135/65    Pulse: 91 73 84 79  Resp: 18 18  18   Temp: 99 F (37.2 C) 98.4 F (36.9 C)  98.7 F (37.1 C)  TempSrc: Oral Oral  Oral  SpO2: 95% 98% 100% 100%  Weight:        Intake/Output Summary (Last 24 hours) at 06/25/2017 1446 Last data filed at 06/25/2017 1300 Gross per 24 hour  Intake 2731.25 ml  Output 1100 ml  Net 1631.25 ml   Filed Weights   06/23/17 1721  Weight: 79.4 kg (175 lb)   Exam  General: Well developed, well nourished, NAD, appears stated age  HEENT: NCAT, mucous membranes moist.   Cardiovascular: S1 S2 auscultated, RRR, no murmurs  Respiratory: Clear to auscultation bilaterally with equal chest rise  Abdomen: Soft, nontender, nondistended, + bowel sounds  Extremities: warm dry without cyanosis clubbing or edema of LE  Neuro: AAOx3, nonfocal  Skin: RUE Erythema and edema extending from the forearm  Psych: Pleasant, appropriate mood and affect  Data Reviewed: I have personally reviewed following labs and imaging studies  CBC: Recent Labs  Lab 06/23/17 1548 06/23/17 1911 06/24/17 0512 06/25/17 0431  WBC 17.2* 12.7* 9.4 8.0  NEUTROABS 13.4*  --   --   --   HGB 14.9 12.0* 12.7* 11.7*  HCT 43.2 35.2* 38.6* 36.0*  MCV 93.5 93.1 93.5 94.5  PLT 201 188 152 992   Basic Metabolic Panel: Recent Labs  Lab 06/23/17 1548 06/23/17 1911 06/24/17 0512 06/25/17 0431  NA 130*  --  136 136  K 4.4  --  3.8 3.8  CL 100*  --  108 107  CO2 18*  --  20* 23  GLUCOSE 85  --  79 87  BUN 16  --  11 6  CREATININE 1.09 1.08 1.07 0.96  CALCIUM 9.1  --  8.5* 8.7*   GFR: Estimated Creatinine Clearance: 83.7 mL/min (by C-G formula based on SCr of 0.96 mg/dL). Liver Function Tests: Recent Labs  Lab 06/23/17 1548  AST 37  ALT 28  ALKPHOS 79  BILITOT 1.1  PROT 7.4  ALBUMIN 4.5   No results for input(s): LIPASE, AMYLASE in the last 168 hours. No results for input(s): AMMONIA in the last 168 hours. Coagulation Profile: No results for input(s): INR, PROTIME in  the last 168 hours. Cardiac Enzymes: No results for input(s): CKTOTAL, CKMB, CKMBINDEX, TROPONINI in the last 168 hours. BNP (last 3 results) No results for input(s): PROBNP in the last 8760 hours. HbA1C: No results for input(s): HGBA1C in the last 72 hours. CBG: No results for input(s): GLUCAP in the last 168 hours. Lipid Profile: No results for input(s): CHOL, HDL, LDLCALC, TRIG, CHOLHDL, LDLDIRECT in the last 72 hours. Thyroid Function Tests: No results for input(s): TSH, T4TOTAL, FREET4, T3FREE, THYROIDAB in the last 72 hours. Anemia Panel: No results for input(s): VITAMINB12, FOLATE, FERRITIN, TIBC, IRON, RETICCTPCT in the last 72 hours. Urine analysis:    Component Value Date/Time   COLORURINE AMBER (A) 07/31/2014 2129   APPEARANCEUR CLOUDY (A) 07/31/2014 2129   LABSPEC 1.025 07/31/2014 2129   PHURINE 5.0  07/31/2014 2129   GLUCOSEU NEGATIVE 07/31/2014 2129   HGBUR NEGATIVE 07/31/2014 2129   BILIRUBINUR n 06/27/2016 1524   KETONESUR 15 (A) 07/31/2014 2129   PROTEINUR n 06/27/2016 1524   PROTEINUR NEGATIVE 07/31/2014 2129   UROBILINOGEN 0.2 06/27/2016 1524   UROBILINOGEN 0.2 07/31/2014 2129   NITRITE n 06/27/2016 1524   NITRITE NEGATIVE 07/31/2014 2129   LEUKOCYTESUR Negative 06/27/2016 1524   Sepsis Labs: @LABRCNTIP (procalcitonin:4,lacticidven:4)  ) Recent Results (from the past 240 hour(s))  Blood Culture (routine x 2)     Status: None (Preliminary result)   Collection Time: 06/23/17  4:04 PM  Result Value Ref Range Status   Specimen Description BLOOD LEFT ANTECUBITAL  Final   Special Requests   Final    BOTTLES DRAWN AEROBIC AND ANAEROBIC Blood Culture adequate volume   Culture NO GROWTH 2 DAYS  Final   Report Status PENDING  Incomplete  Blood Culture (routine x 2)     Status: None (Preliminary result)   Collection Time: 06/23/17  4:09 PM  Result Value Ref Range Status   Specimen Description BLOOD LEFT HAND  Final   Special Requests Blood Culture adequate  volume  Final   Culture NO GROWTH 2 DAYS  Final   Report Status PENDING  Incomplete      Radiology Studies: Dg Chest Port 1 View  Result Date: 06/23/2017 CLINICAL DATA:  61 year old male with sepsis. EXAM: PORTABLE CHEST 1 VIEW COMPARISON:  09/13/2016 CT, 02/26/2013 chest radiograph prior studies FINDINGS: The cardiomediastinal silhouette is unremarkable. Mild left basilar scarring again noted. There is no evidence of focal airspace disease, pulmonary edema, suspicious pulmonary nodule/mass, pleural effusion, or pneumothorax. No acute bony abnormalities are identified. Lower cervical fusion changes again noted. IMPRESSION: No evidence of acute cardiopulmonary disease. Electronically Signed   By: Margarette Canada M.D.   On: 06/23/2017 19:14     Scheduled Meds: . allopurinol  100 mg Oral Daily  . ALPRAZolam  0.5 mg Oral TID  . atorvastatin  20 mg Oral q1800  . diltiazem  360 mg Oral Daily  . enoxaparin (LOVENOX) injection  40 mg Subcutaneous Q24H  . losartan  100 mg Oral Daily  . pantoprazole  40 mg Oral Daily  . sertraline  50 mg Oral Daily  . vitamin B-12  1,000 mcg Oral Daily   Continuous Infusions: . piperacillin-tazobactam (ZOSYN)  IV 3.375 g (06/25/17 1328)  . sodium chloride    . vancomycin Stopped (06/25/17 0554)     LOS: 2 days   Time Spent in minutes   30 minutes  Esmirna Ravan D.O. on 06/25/2017 at 2:46 PM  Between 7am to 7pm - Pager - [phone]  After 7pm go to [email or web address] - password [email or web address]  And look for the night coverage person covering for me after hours  [facility] Office  [phone]

## 2017-06-25 NOTE — Care Management Note (Signed)
Case Management Note  Patient Details  Name: Aaron Black MRN: [id number] Date of Birth: 25-Dec-1955  Subjective/Objective:    sepsis s/t right upper extremity cellulitis s/t to cat scratch                 Action/Plan: Discharge Planning: NCM spoke to pt and states wife at home to assist with care. He has RW and bedside commode at home. Will continue to follow for dc needs.   PCP Marletta Lor MD  Expected Discharge Date:                 Expected Discharge Plan:  Home/Self Care  In-House Referral:  NA  Discharge planning Services  CM Consult  Post Acute Care Choice:  NA Choice offered to:  NA  DME Arranged:  N/A DME Agency:  NA  HH Arranged:  NA HH Agency:  NA  Status of Service:  Completed, signed off  If discussed at [place] of Stay Meetings, dates discussed:    Additional Comments:  Erenest Rasher, RN 06/25/2017, 4:35 PM

## 2017-06-26 DIAGNOSIS — E785 Hyperlipidemia, unspecified: Secondary | ICD-10-CM | POA: Diagnosis not present

## 2017-06-26 DIAGNOSIS — S50812A Abrasion of left forearm, initial encounter: Secondary | ICD-10-CM

## 2017-06-26 DIAGNOSIS — M1000 Idiopathic gout, unspecified site: Secondary | ICD-10-CM | POA: Diagnosis not present

## 2017-06-26 DIAGNOSIS — W5503XA Scratched by cat, initial encounter: Secondary | ICD-10-CM

## 2017-06-26 DIAGNOSIS — Z720 Tobacco use: Secondary | ICD-10-CM | POA: Diagnosis not present

## 2017-06-26 DIAGNOSIS — A419 Sepsis, unspecified organism: Secondary | ICD-10-CM | POA: Diagnosis not present

## 2017-06-26 DIAGNOSIS — L03113 Cellulitis of right upper limb: Secondary | ICD-10-CM | POA: Diagnosis not present

## 2017-06-26 DIAGNOSIS — I10 Essential (primary) hypertension: Secondary | ICD-10-CM | POA: Diagnosis not present

## 2017-06-26 MED ORDER — AZITHROMYCIN 250 MG PO TABS
250.0000 mg | ORAL_TABLET | Freq: Every day | ORAL | Status: DC
  Administered 2017-06-27: 250 mg via ORAL
  Filled 2017-06-26: qty 1

## 2017-06-26 MED ORDER — AZITHROMYCIN 500 MG PO TABS
500.0000 mg | ORAL_TABLET | Freq: Every day | ORAL | Status: AC
  Administered 2017-06-26: 500 mg via ORAL
  Filled 2017-06-26: qty 1

## 2017-06-26 MED ORDER — AMPICILLIN-SULBACTAM SODIUM 3 (2-1) G IJ SOLR
3.0000 g | Freq: Four times a day (QID) | INTRAMUSCULAR | Status: DC
  Administered 2017-06-26 – 2017-06-27 (×4): 3 g via INTRAVENOUS
  Filled 2017-06-26 (×5): qty 3

## 2017-06-26 NOTE — Progress Notes (Signed)
PROGRESS NOTE    [name]  INO:[id number] DOB: Aug 11, 1955 DOA: 06/23/2017 PCP: Marletta Lor, MD   Chief Complaint  Patient presents with  . Animal Bite    Brief Narrative:  HPI On 06/23/2017  Aaron Black is a 61 y.o. male with a medical history of hypertension, tobacco abuse, who presented with complaints of right arm swelling and pain. Patient states he has a 59-month-old kitten that scratched him yesterday. The scratch wad small and noted on his right forearm. When patient awoke this morning, he noted that his arm was swollen and painful and that he noted his redness and streaking coursing up his arm. Patient states as the day has progressed, his arm became more painful and difficult to move. He does state that his kitten has been vaccinated. Denies any fever or chills at home. Denies any recent travel, or illness. Denies any current chest pain, shortness of breath, abdominal pain, nausea vomiting, diarrhea or constipation, dizziness or headache, changes in urinary habits. Patient stated his tetanus shot is up-to-date.  Assessment & Plan   Sepsis secondary to right upper extremity cellulitis secondary to cat scratch -Patient presented with leukocytosis and elevated lactic acid -developed fever of 100.59F during hospitalization -Erythema and edema extended to patient's axilla, now regressing, however, more erythema on forearm with swollen axillary lymph nodes  -Continue vancomycin and zosyn, IV Fluids -Blood cultures show no growth -lactic acidosis resolved -CT arm: soft tissue edema in the subcutaneous fat along the dorsal and ulnar aspect of the forearm extending into the anterior half of the right upper arm to the level of the axilla. No drainable fluid collection to the suggest abscess.  -Continue pain control -Given that area on the patient's forearm is worsening, will consult ID -?Bartonella  Essential hypertension -Continue Cardizem, losartan  Gout -Continue  allopurinol, hold colchicine  Hyperlipidemia -Continue statin  Anxiety/depression -Continue Xanax, Zoloft  GERD -Continue PPI  Tobacco abuse -Discuss cessation -Refused Nicotine patch   Chronic back pain -continue home medications  DVT Prophylaxis  Lovenox  Code Status: Full  Family Communication: Wife at bedside  Disposition Plan: admitted, continue IV antibiotics. ID consulted.  Home when medically stable  Consultants Infectious disease  Procedures  none  Antibiotics   Anti-infectives (From admission, onward)   Start     Dose/Rate Route Frequency Ordered Stop   06/24/17 2200  piperacillin-tazobactam (ZOSYN) IVPB 3.375 g  Status:  Discontinued     3.375 g 12.5 mL/hr over 240 Minutes Intravenous Every 8 hours 06/23/17 1735 06/23/17 1912   06/24/17 0600  vancomycin (VANCOCIN) IVPB 1000 mg/200 mL premix     1,000 mg 200 mL/hr over 60 Minutes Intravenous Every 12 hours 06/23/17 1735     06/23/17 1912  piperacillin-tazobactam (ZOSYN) IVPB 3.375 g     3.375 g 12.5 mL/hr over 240 Minutes Intravenous Every 8 hours 06/23/17 1912     06/23/17 1900  piperacillin-tazobactam (ZOSYN) IVPB 3.375 g  Status:  Discontinued     3.375 g 100 mL/hr over 30 Minutes Intravenous  Once 06/23/17 1847 06/23/17 1853   06/23/17 1900  vancomycin (VANCOCIN) IVPB 1000 mg/200 mL premix  Status:  Discontinued     1,000 mg 200 mL/hr over 60 Minutes Intravenous  Once 06/23/17 1847 06/23/17 1853   06/23/17 1800  vancomycin (VANCOCIN) 1,500 mg in sodium chloride 0.9 % 500 mL IVPB     1,500 mg 250 mL/hr over 120 Minutes Intravenous  Once 06/23/17 1735 06/23/17 2000  06/23/17 1700  vancomycin (VANCOCIN) IVPB 1000 mg/200 mL premix  Status:  Discontinued     1,000 mg 200 mL/hr over 60 Minutes Intravenous  Once 06/23/17 1649 06/23/17 1736   06/23/17 1615  piperacillin-tazobactam (ZOSYN) IVPB 3.375 g     3.375 g 100 mL/hr over 30 Minutes Intravenous  Once 06/23/17 1604 06/23/17 1655       Subjective:   Aaron Black seen and examined today.  Feels pain has mildly improved, however worried as redness on forearm is worsening. Continue to have swelling in armpit.  Denies chest pain, shortness of breath, abdominal pain, N/V/D/C.  Objective:   Vitals:   06/25/17 0800 06/25/17 1300 06/25/17 2108 06/26/17 0607  BP: (!) 150/74 135/65 (!) 158/83 (!) 158/71  Pulse: 84 79 81 70  Resp:  18 17 16   Temp:  98.7 F (37.1 C) 98.7 F (37.1 C) 97.9 F (36.6 C)  TempSrc:  Oral Oral Oral  SpO2: 100% 100% 98% 99%  Weight:        Intake/Output Summary (Last 24 hours) at 06/26/2017 1341 Last data filed at 06/26/2017 1200 Gross per 24 hour  Intake 1200 ml  Output 2100 ml  Net -900 ml   Filed Weights   06/23/17 1721  Weight: 79.4 kg (175 lb)   Exam  General: Well developed, well nourished, NAD, appears stated age  HEENT: NCAT, mucous membranes moist.   Cardiovascular: S1 S2 auscultated, RRR, no murmurs  Respiratory: Clear to auscultation bilaterally with equal chest rise  Abdomen: Soft, nontender, nondistended, + bowel sounds  Extremities: warm dry without cyanosis clubbing or edema  Neuro: AAOx3, nonfocal  Skin: RUE Erythema and edema extending from the forearm- worsening on the forearm, however improvement on upper arm  Psych: Appropriate mood and affect, pleasant  Data Reviewed: I have personally reviewed following labs and imaging studies  CBC: Recent Labs  Lab 06/23/17 1548 06/23/17 1911 06/24/17 0512 06/25/17 0431  WBC 17.2* 12.7* 9.4 8.0  NEUTROABS 13.4*  --   --   --   HGB 14.9 12.0* 12.7* 11.7*  HCT 43.2 35.2* 38.6* 36.0*  MCV 93.5 93.1 93.5 94.5  PLT 201 188 152 951   Basic Metabolic Panel: Recent Labs  Lab 06/23/17 1548 06/23/17 1911 06/24/17 0512 06/25/17 0431  NA 130*  --  136 136  K 4.4  --  3.8 3.8  CL 100*  --  108 107  CO2 18*  --  20* 23  GLUCOSE 85  --  79 87  BUN 16  --  11 6  CREATININE 1.09 1.08 1.07 0.96  CALCIUM 9.1   --  8.5* 8.7*   GFR: Estimated Creatinine Clearance: 83.7 mL/min (by C-G formula based on SCr of 0.96 mg/dL). Liver Function Tests: Recent Labs  Lab 06/23/17 1548  AST 37  ALT 28  ALKPHOS 79  BILITOT 1.1  PROT 7.4  ALBUMIN 4.5   No results for input(s): LIPASE, AMYLASE in the last 168 hours. No results for input(s): AMMONIA in the last 168 hours. Coagulation Profile: No results for input(s): INR, PROTIME in the last 168 hours. Cardiac Enzymes: No results for input(s): CKTOTAL, CKMB, CKMBINDEX, TROPONINI in the last 168 hours. BNP (last 3 results) No results for input(s): PROBNP in the last 8760 hours. HbA1C: No results for input(s): HGBA1C in the last 72 hours. CBG: No results for input(s): GLUCAP in the last 168 hours. Lipid Profile: No results for input(s): CHOL, HDL, LDLCALC, TRIG, CHOLHDL, LDLDIRECT in the last  72 hours. Thyroid Function Tests: No results for input(s): TSH, T4TOTAL, FREET4, T3FREE, THYROIDAB in the last 72 hours. Anemia Panel: No results for input(s): VITAMINB12, FOLATE, FERRITIN, TIBC, IRON, RETICCTPCT in the last 72 hours. Urine analysis:    Component Value Date/Time   COLORURINE AMBER (A) 07/31/2014 2129   APPEARANCEUR CLOUDY (A) 07/31/2014 2129   LABSPEC 1.025 07/31/2014 2129   PHURINE 5.0 07/31/2014 2129   GLUCOSEU NEGATIVE 07/31/2014 2129   HGBUR NEGATIVE 07/31/2014 2129   BILIRUBINUR n 06/27/2016 1524   KETONESUR 15 (A) 07/31/2014 2129   PROTEINUR n 06/27/2016 1524   PROTEINUR NEGATIVE 07/31/2014 2129   UROBILINOGEN 0.2 06/27/2016 1524   UROBILINOGEN 0.2 07/31/2014 2129   NITRITE n 06/27/2016 1524   NITRITE NEGATIVE 07/31/2014 2129   LEUKOCYTESUR Negative 06/27/2016 1524   Sepsis Labs: @LABRCNTIP (procalcitonin:4,lacticidven:4)  ) Recent Results (from the past 240 hour(s))  Blood Culture (routine x 2)     Status: None (Preliminary result)   Collection Time: 06/23/17  4:04 PM  Result Value Ref Range Status   Specimen Description  BLOOD LEFT ANTECUBITAL  Final   Special Requests   Final    BOTTLES DRAWN AEROBIC AND ANAEROBIC Blood Culture adequate volume   Culture NO GROWTH 3 DAYS  Final   Report Status PENDING  Incomplete  Blood Culture (routine x 2)     Status: None (Preliminary result)   Collection Time: 06/23/17  4:09 PM  Result Value Ref Range Status   Specimen Description BLOOD LEFT HAND  Final   Special Requests Blood Culture adequate volume  Final   Culture NO GROWTH 3 DAYS  Final   Report Status PENDING  Incomplete      Radiology Studies: Ct Extrem Up Entire Arm R W/cm  Result Date: 06/25/2017 CLINICAL DATA:  Pt was scratched by his cat on his lower RIGHT forearm; soft tissue swelling, redness, pain to entire arm, pt on antibiotics, area of redness has increased. EXAM: CT OF THE UPPER RIGHT EXTREMITY WITH CONTRAST TECHNIQUE: Multidetector CT imaging of the upper right extremity was performed according to the standard protocol following intravenous contrast administration. COMPARISON:  None. CONTRAST:  ISOVUE-300 IOPAMIDOL (ISOVUE-300) INJECTION 61% FINDINGS: Bones/Joint/Cartilage No fracture or dislocation. Normal alignment. No joint effusion. 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 Ligaments Ligaments are suboptimally evaluated by CT. Muscles and Tendons Muscles are normal. No muscle atrophy. No intramuscular fluid collection or hematoma. Soft tissue Soft tissue edema in the subcutaneous fat along the dorsal and ulnar aspect of the forearm extending into the anterior half of the right upper arm to the level of the axilla most consistent with cellulitis. Prominent non enlarged right axillary lymph node measuring 9 mm in short axis. No fluid collection or hematoma. No soft tissue mass. IMPRESSION: 1. Soft tissue edema in the subcutaneous fat along the dorsal and ulnar aspect of the forearm extending into the anterior half of the  right upper arm to the level of the axilla most concerning for cellulitis. No drainable fluid collection to suggest an abscess. Electronically Signed   By: Kathreen Devoid   On: 06/25/2017 19:06     Scheduled Meds: . allopurinol  100 mg Oral Daily  . ALPRAZolam  0.5 mg Oral TID  . atorvastatin  20 mg Oral q1800  . diltiazem  360 mg Oral Daily  . enoxaparin (LOVENOX) injection  40 mg Subcutaneous Q24H  . losartan  100 mg Oral Daily  . pantoprazole  40 mg Oral Daily  . sertraline  50 mg Oral Daily  . vitamin B-12  1,000 mcg Oral Daily   Continuous Infusions: . piperacillin-tazobactam (ZOSYN)  IV 3.375 g (06/26/17 0542)  . sodium chloride    . vancomycin 1,000 mg (06/26/17 0542)     LOS: 3 days   Time Spent in minutes   30 minutes  Marqui Formby D.O. on 06/26/2017 at 1:41 PM  Between 7am to 7pm - Pager - [phone]  After 7pm go to [email or web address] - password [email or web address]  And look for the night coverage person covering for me after hours  [facility] Office  [phone]

## 2017-06-26 NOTE — Plan of Care (Signed)
  Progressing Fluid Volume: Hemodynamic stability will improve 06/26/2017 1224 - Progressing by Rance Muir, RN Clinical Measurements: Diagnostic test results will improve 06/26/2017 1224 - Progressing by Rance Muir, RN Signs and symptoms of infection will decrease 06/26/2017 1224 - Progressing by Rance Muir, RN Respiratory: Ability to maintain adequate ventilation will improve 06/26/2017 1224 - Progressing by Rance Muir, RN Education: Knowledge of General Education information will improve 06/26/2017 1224 - Progressing by Rance Muir, RN Health Behavior/Discharge Planning: Ability to manage health-related needs will improve 06/26/2017 1224 - Progressing by Rance Muir, RN Clinical Measurements: Ability to maintain clinical measurements within normal limits will improve 06/26/2017 1224 - Progressing by Rance Muir, RN Will remain free from infection 06/26/2017 1224 - Progressing by Rance Muir, RN Diagnostic test results will improve 06/26/2017 1224 - Progressing by Rance Muir, RN Respiratory complications will improve 06/26/2017 1224 - Progressing by Rance Muir, RN Cardiovascular complication will be avoided 06/26/2017 1224 - Progressing by Rance Muir, RN Activity: Risk for activity intolerance will decrease 06/26/2017 1224 - Progressing by Rance Muir, RN Nutrition: Adequate nutrition will be maintained 06/26/2017 1224 - Progressing by Rance Muir, RN Coping: Level of anxiety will decrease 06/26/2017 1224 - Progressing by Rance Muir, RN Elimination: Will not experience complications related to bowel motility 06/26/2017 1224 - Progressing by Rance Muir, RN Will not experience complications related to urinary retention 06/26/2017 1224 - Progressing by Rance Muir, RN Pain Managment: General experience of comfort will improve 06/26/2017 1224 - Progressing by Rance Muir, RN Safety: Ability to remain free from injury will improve 06/26/2017 1224 - Progressing by Rance Muir, RN Skin Integrity: Risk for impaired  skin integrity will decrease 06/26/2017 1224 - Progressing by Rance Muir, RN Clinical Measurements: Ability to avoid or minimize complications of infection will improve 06/26/2017 1224 - Progressing by Rance Muir, RN Skin Integrity: Skin integrity will improve 06/26/2017 1224 - Progressing by Rance Muir, RN

## 2017-06-26 NOTE — Progress Notes (Signed)
Pharmacy Antibiotic Note  Aaron Black is a 61 y.o. male admitted on 06/23/2017 with cat scratch on R forearm with surrounding erythema and edema. Planning to start empiric antibiotics for possible early sepsis from cellulitis. WBC 17.2, afebrile.  D#4 of abx for cellulitis from cat scratch - now afebrile, WBC normalized, LA down 1.3.   Plan: Continue vancomycin 1g IV Q12H  Continue Zosyn EI 3.375gm IV Q8H Monitor clinical picture, renal function, VT prn F/U C&S, abx deescalation / LOT  If seeing clinical improvement consider transition to PO soon?  Weight: 175 lb (79.4 kg)  Temp (24hrs), Avg:98.4 F (36.9 C), Min:97.9 F (36.6 C), Max:98.7 F (37.1 C)  Recent Labs  Lab 06/23/17 1548 06/23/17 1556 06/23/17 1830 06/23/17 1911 06/23/17 2229 06/24/17 0512 06/25/17 0431  WBC 17.2*  --   --  12.7*  --  9.4 8.0  CREATININE 1.09  --   --  1.08  --  1.07 0.96  LATICACIDVEN  --  4.13* 3.43* 2.1* 1.3  --   --     Estimated Creatinine Clearance: 83.7 mL/min (by C-G formula based on SCr of 0.96 mg/dL).    Allergies  Allergen Reactions  . Lisinopril Swelling    Swollen Lip  . Percodan [Oxycodone-Aspirin] Rash    Percodan caused rash---but he can tolerate Percocet and aspirin on their own     Reginia Naas 06/26/2017 10:24 AM

## 2017-06-26 NOTE — Progress Notes (Signed)
[facility] Infusion Coordinator will follow pt with ID team to support home IV ABX as ordered at DC.  If patient discharges after hours, please call [phone].   Larry Sierras 06/26/2017, 11:13 AM

## 2017-06-26 NOTE — Consult Note (Addendum)
[facility] for Infectious Disease    Date of Admission:  06/23/2017     Total days of antibiotics 3   Day 3 Vancomycin   Day 3 Zosyn                Reason for Consult: cellulitis of right arm d/t cat scratch     Referring Provider: [facility]:  1. Cellulitis r/t Cat Scratch Infection: compared to previous pictures from admit streaking is receding. No palpable LN in axillae or epitrochlear sites. Fever improved. WBC improved. No signs of encephalitis. Liver function tests normal.   Would stop vancomycin and zosyn   Would start azithromycin 500 mg PO x1 then 250 mg PO x 4d in addition to Unasyn while inpatient for concern over true cat scratch disease  Tetanus shot is up to date  2. Health Maintenance: HIV (-)   Would recommend checking his Hep C Ab per screening guidelines    HPI: Aaron Black is a 62 y.o. male admitted to the hospital with right arm pain and redness. Significant pmhx of hypertension, tobacco abuse, depression, gout, GERD, and sleep apnea. He has had several back surgeries for chronic pain. The patient has a 31 month old kitten that scratched him on his right forearm on 11/30. The next morning when he awoke he noted swelling, pain, redness and streaking up his arm which progressed to more pain and difficulty moving.   Hospital Course: started on vancomycin and zosyn in ED for sepsis criteria with WBC 17.2, Temp 100.6, lactic acid 4.13 and tachycardic. Blood cultures drawn prior to antibiotics --> NGTD. WBC has now normalized.   Aaron Black reports minimal improvement in his condition. Still having pain and tightness to the posterior aspect of his arm and pain at the elbow. Tenderness over the patch of skin surrounding inoculation site. Worried about going home until he sees some more improvement. Reports he is having some loose stools d/t antibiotics.   Active Problems:   HLD (hyperlipidemia)   Gout   Anemia, unspecified  Essential hypertension   Tobacco abuse   Cellulitis   Sepsis ([place])   . allopurinol  100 mg Oral Daily  . ALPRAZolam  0.5 mg Oral TID  . atorvastatin  20 mg Oral q1800  . diltiazem  360 mg Oral Daily  . enoxaparin (LOVENOX) injection  40 mg Subcutaneous Q24H  . losartan  100 mg Oral Daily  . pantoprazole  40 mg Oral Daily  . sertraline  50 mg Oral Daily  . vitamin B-12  1,000 mcg Oral Daily    Review of Systems: Review of Systems  Constitutional: Positive for fever. Negative for chills.  HENT: Negative for tinnitus.   Eyes: Negative for blurred vision, double vision and photophobia.  Respiratory: Negative for cough and sputum production.   Cardiovascular: Negative for chest pain.  Gastrointestinal: Positive for diarrhea (a/w antibiotics ). Negative for abdominal pain, nausea and vomiting.  Genitourinary: Negative for dysuria.  Musculoskeletal: Positive for joint pain (right elbow ). Negative for myalgias.  Skin: Positive for rash.       Streaking up right arm with swelling as per HPI   Neurological: Negative for dizziness and headaches.    Past Medical History:  Diagnosis Date  . Anemia   . Anxiety   . Arthritis   . B12 deficiency   . Cervical disc disease    a. 07/2003 ant cervical deomcpression and  fusion C5-6/C6-7;  b. 09/2007 post cervical laminectomy C4-5 with scres and arthrodesis C4-C7.  Marland Kitchen Chest pain at rest 02/25/2013  . Chronic lower back pain   . Complication of anesthesia    " i WOKE UP DURING A COLONOSCOPY "- 2008  . Depression   . Diverticulosis of colon   . GERD (gastroesophageal reflux disease)   . Gout   . Hyperlipidemia   . Hypertension   . Pleurisy   . Shortness of breath dyspnea    with exertion- "I cant exercise now"  . Sleep apnea    Patient denies  . Tobacco abuse    a. 40 yrs, 1.5-3 ppd over that time.    Social History   Tobacco Use  . Smoking status: Heavy Tobacco Smoker    Packs/day: 0.50    Years: 40.00    Pack years: 20.00     Types: Cigarettes    Last attempt to quit: 02/24/2013    Years since quitting: 4.3  . Smokeless tobacco: Never Used  . Tobacco comment: Currently smoking 1.5 ppd.  Has previously smoked up to 3 ppd and did so for about 20 yrs.  Substance Use Topics  . Alcohol use: Yes    Comment: at least 3 beers/night.- 01/20/16  . Drug use: No    Family History  Problem Relation Age of Onset  . Bone cancer Mother   . Squamous cell carcinoma Mother        died @ 73  . Heart attack Mother   . Hypertension Father   . Diabetes Father        borderline  . Congestive Heart Failure Father        alive @ 10  . Heart failure Father   . Sudden death Brother        died @ 39  . Other Brother        died in [place] @ 26 - struck by drunk driver  . Other Brother        died in [place] @ 5 - struck by drunk driver  . Heart attack Brother   . Stroke Paternal Grandfather   . Colon cancer Neg Hx   . Esophageal cancer Neg Hx   . Rectal cancer Neg Hx   . Stomach cancer Neg Hx    Allergies  Allergen Reactions  . Lisinopril Swelling    Swollen Lip  . Percodan [Oxycodone-Aspirin] Rash    Percodan caused rash---but he can tolerate Percocet and aspirin on their own     OBJECTIVE: Blood pressure (!) 158/71, pulse 70, temperature 97.9 F (36.6 C), temperature source Oral, resp. rate 16, weight 175 lb (79.4 kg), SpO2 99 %.  Physical Exam  Constitutional: He is oriented to person, place, and time and well-developed, well-nourished, and in no distress.  HENT:  Mouth/Throat: Oropharynx is clear and moist.  Eyes: Pupils are equal, round, and reactive to light. No scleral icterus.  Neck: Normal range of motion. Neck supple.  Cardiovascular: Normal rate, regular rhythm and normal heart sounds.  No murmur heard. Pulmonary/Chest: Effort normal and breath sounds normal. No respiratory distress.  Abdominal: Soft. Bowel sounds are normal. He exhibits no distension. There is no tenderness.  Musculoskeletal: Normal range  of motion.       Right elbow: He exhibits swelling. He exhibits normal range of motion. Tenderness found.  Lymphadenopathy:    He has no cervical adenopathy.    He has no axillary adenopathy.  Right axillary: No pectoral and no lateral adenopathy present.       Right: No supraclavicular and no epitrochlear adenopathy present.  Neurological: He is alert and oriented to person, place, and time. No cranial nerve deficit.  Skin: Skin is warm and dry.     Psychiatric: Affect normal.  Vitals reviewed.      Lab Results Lab Results  Component Value Date   WBC 8.0 06/25/2017   HGB 11.7 (L) 06/25/2017   HCT 36.0 (L) 06/25/2017   MCV 94.5 06/25/2017   PLT 165 06/25/2017    Lab Results  Component Value Date   CREATININE 0.96 06/25/2017   BUN 6 06/25/2017   NA 136 06/25/2017   K 3.8 06/25/2017   CL 107 06/25/2017   CO2 23 06/25/2017    Lab Results  Component Value Date   ALT 28 06/23/2017   AST 37 06/23/2017   ALKPHOS 79 06/23/2017   BILITOT 1.1 06/23/2017     Microbiology: Recent Results (from the past 240 hour(s))  Blood Culture (routine x 2)     Status: None (Preliminary result)   Collection Time: 06/23/17  4:04 PM  Result Value Ref Range Status   Specimen Description BLOOD LEFT ANTECUBITAL  Final   Special Requests   Final    BOTTLES DRAWN AEROBIC AND ANAEROBIC Blood Culture adequate volume   Culture NO GROWTH 2 DAYS  Final   Report Status PENDING  Incomplete  Blood Culture (routine x 2)     Status: None (Preliminary result)   Collection Time: 06/23/17  4:09 PM  Result Value Ref Range Status   Specimen Description BLOOD LEFT HAND  Final   Special Requests Blood Culture adequate volume  Final   Culture NO GROWTH 2 DAYS  Final   Report Status PENDING  Incomplete    Janene Madeira, MSN, NP-C [facility] Cell: [phone] Pager: [phone]  06/26/2017 10:23 AM   Patient seen and examined, agree  with above note.  I dictated the care and orders written for this patient under my direction. I was immediately available on site, by phone/page to assist in the care of this patient.

## 2017-06-27 DIAGNOSIS — E785 Hyperlipidemia, unspecified: Secondary | ICD-10-CM

## 2017-06-27 DIAGNOSIS — L03113 Cellulitis of right upper limb: Secondary | ICD-10-CM

## 2017-06-27 DIAGNOSIS — I10 Essential (primary) hypertension: Secondary | ICD-10-CM | POA: Diagnosis not present

## 2017-06-27 DIAGNOSIS — A419 Sepsis, unspecified organism: Secondary | ICD-10-CM | POA: Diagnosis not present

## 2017-06-27 DIAGNOSIS — M1000 Idiopathic gout, unspecified site: Secondary | ICD-10-CM

## 2017-06-27 DIAGNOSIS — Z720 Tobacco use: Secondary | ICD-10-CM | POA: Diagnosis not present

## 2017-06-27 MED ORDER — METRONIDAZOLE 500 MG PO TABS: 500.0000 mg | ORAL_TABLET | Freq: Three times a day (TID) | ORAL | 0 refills | Status: AC

## 2017-06-27 MED ORDER — IBUPROFEN 600 MG PO TABS: 600.0000 mg | ORAL_TABLET | Freq: Four times a day (QID) | ORAL | 0 refills | Status: DC | PRN

## 2017-06-27 MED ORDER — DOXYCYCLINE HYCLATE 100 MG PO TABS: 100.0000 mg | ORAL_TABLET | Freq: Two times a day (BID) | ORAL | 0 refills | Status: DC

## 2017-06-27 NOTE — Discharge Summary (Signed)
Physician Discharge Summary  Aaron Black ZOX:[id number] DOB: 03/05/1956 DOA: 06/23/2017  PCP: Marletta Lor, MD  Admit date: 06/23/2017 Discharge date: 06/27/2017  Admitted From: Home Disposition:  Home  Recommendations for Outpatient Follow-up:  1. Follow up with PCP in 1 week 2. Follow-up with infectious diseases if needed as an outpatient   Home Health: No  Equipment/Devices: None  Discharge Condition: Stable CODE STATUS: Full  Diet recommendation: Heart Healthy   Brief/Interim Summary: 61 year old male with history of hypertension, tobacco abuse presented with right arm swelling and pain after being scratched by his 32-month-old kitten. Patient was initially started on vancomycin and Zosyn. CT of the arm did not show any drainable fluid collection. Antibiotics were changed to Unasyn. ID was consulted. Zithromax was added. Cellulitis is improving and ID has cleared the patient for discharge on oral doxycycline and Flagyl for 6 more days.  Discharge Diagnoses:  Active Problems:   HLD (hyperlipidemia)   Gout   Anemia, unspecified   Essential hypertension   Tobacco abuse   Cellulitis   Sepsis ([place])  Sepsis - Resolved. Currently hemodynamically stable. -Blood cultures negative so far  Right upper extremity cellulitis secondary to cat scratch - Initially started on intravenous vancomycin and Zosyn. CT of the arm showed note minimal fluid collection to suggest abscess -Currently on Unasyn and oral Zithromax was added by ID on 06/26/2017 -ID has cleared the patient for discharge on oral doxycycline and Flagyl for 6 more days -Cellulitis has improved significantly but still present  Leukocytosis -Resolved  Essential hypertension -Continue Cardizem and losartan  Gout -Continue allopurinol and colchicine  Hyperlipidemia -Continue statin  Anxiety/depression -continue Xanax, Zoloft  GERD -Continue PPI  Tobacco abuse -Counseled about tobacco  sedation  Chronic back pain -Continue home medications  Discharge Instructions  Discharge Instructions    Call MD for:  difficulty breathing, headache or visual disturbances   Complete by:  As directed    Call MD for:  extreme fatigue   Complete by:  As directed    Call MD for:  hives   Complete by:  As directed    Call MD for:  persistant dizziness or light-headedness   Complete by:  As directed    Call MD for:  persistant nausea and vomiting   Complete by:  As directed    Call MD for:  severe uncontrolled pain   Complete by:  As directed    Call MD for:  temperature >100.4   Complete by:  As directed    Diet - low sodium heart healthy   Complete by:  As directed    Increase activity slowly   Complete by:  As directed      Allergies as of 06/27/2017      Reactions   Lisinopril Swelling   Swollen Lip   Percodan [oxycodone-aspirin] Rash   Percodan caused rash---but he can tolerate Percocet and aspirin on their own       Medication List    STOP taking these medications   HYDROcodone-acetaminophen 5-325 MG tablet Commonly known as:  NORCO/VICODIN     TAKE these medications   albuterol 108 (90 Base) MCG/ACT inhaler Commonly known as:  PROVENTIL HFA;VENTOLIN HFA Inhale 2 puffs into the lungs every 6 (six) hours as needed for wheezing or shortness of breath.   allopurinol 100 MG tablet Commonly known as:  ZYLOPRIM TAKE 1 TABLET DAILY What changed:    how much to take  how to take this  when to take  this   ALPRAZolam 0.5 MG tablet Commonly known as:  XANAX TAKE 1 TABLET BY MOUTH 3 TIMES A DAY What changed:    how much to take  how to take this  when to take this   atorvastatin 20 MG tablet Commonly known as:  LIPITOR TAKE 1 TABLET (20 MG TOTAL) BY MOUTH DAILY.   colchicine 0.6 MG tablet Take 1 tablet (0.6 mg total) by mouth daily as needed (GOUT).   cyclobenzaprine 10 MG tablet Commonly known as:  FLEXERIL Take 1 tablet (10 mg total) by mouth 2  (two) times daily as needed for muscle spasms.   diclofenac sodium 1 % Gel Commonly known as:  VOLTAREN Apply 1 application topically as needed.   diltiazem 360 MG 24 hr capsule Commonly known as:  CARDIZEM CD Take 1 capsule (360 mg total) by mouth daily.   doxycycline 100 MG tablet Commonly known as:  VIBRA-TABS Take 1 tablet (100 mg total) by mouth 2 (two) times daily.   ibuprofen 600 MG tablet Commonly known as:  ADVIL,MOTRIN Take 1 tablet (600 mg total) by mouth every 6 (six) hours as needed for fever, headache, mild pain or moderate pain.   losartan 100 MG tablet Commonly known as:  COZAAR Take 1 tablet (100 mg total) by mouth daily.   metroNIDAZOLE 500 MG tablet Commonly known as:  FLAGYL Take 1 tablet (500 mg total) by mouth 3 (three) times daily for 6 days.   omeprazole 20 MG tablet Commonly known as:  PRILOSEC OTC Take 2 tablets (40 mg total) by mouth daily.   sertraline 50 MG tablet Commonly known as:  ZOLOFT TAKE 1 TABLET (50 MG TOTAL) BY MOUTH DAILY.   vitamin B-12 1000 MCG tablet Commonly known as:  CYANOCOBALAMIN Take 1,000 mcg by mouth daily.      Follow-up Information    Marletta Lor, MD. Schedule an appointment as soon as possible for a visit in 1 week(s).   Specialty:  Internal Medicine Contact information: [place] [address] [phone]          Allergies  Allergen Reactions  . Lisinopril Swelling    Swollen Lip  . Percodan [Oxycodone-Aspirin] Rash    Percodan caused rash---but he can tolerate Percocet and aspirin on their own     Consultations:  ID   Procedures/Studies: Dg Chest Port 1 View  Result Date: 06/23/2017 CLINICAL DATA:  61 year old male with sepsis. EXAM: PORTABLE CHEST 1 VIEW COMPARISON:  09/13/2016 CT, 02/26/2013 chest radiograph prior studies FINDINGS: The cardiomediastinal silhouette is unremarkable. Mild left basilar scarring again noted. There is no evidence of focal airspace  disease, pulmonary edema, suspicious pulmonary nodule/mass, pleural effusion, or pneumothorax. No acute bony abnormalities are identified. Lower cervical fusion changes again noted. IMPRESSION: No evidence of acute cardiopulmonary disease. Electronically Signed   By: Margarette Canada M.D.   On: 06/23/2017 19:14   Ct Extrem Up Entire Arm R W/cm  Result Date: 06/25/2017 CLINICAL DATA:  Pt was scratched by his cat on his lower RIGHT forearm; soft tissue swelling, redness, pain to entire arm, pt on antibiotics, area of redness has increased. EXAM: CT OF THE UPPER RIGHT EXTREMITY WITH CONTRAST TECHNIQUE: Multidetector CT imaging of the upper right extremity was performed according to the standard protocol following intravenous contrast administration. COMPARISON:  None. CONTRAST:  ISOVUE-300 IOPAMIDOL (ISOVUE-300) INJECTION 61% FINDINGS: Bones/Joint/Cartilage No fracture or dislocation. Normal alignment. No joint effusion. Moderate osteoarthritis of the ulnohumeral and radiocapitellar joint with subchondral  reactive changes and marginal osteophytes. Ligaments Ligaments are suboptimally evaluated by CT. Muscles and Tendons Muscles are normal. No muscle atrophy. No intramuscular fluid collection or hematoma. Soft tissue Soft tissue edema in the subcutaneous fat along the dorsal and ulnar aspect of the forearm extending into the anterior half of the right upper arm to the level of the axilla most consistent with cellulitis. Prominent non enlarged right axillary lymph node measuring 9 mm in short axis. No fluid collection or hematoma. No soft tissue mass. IMPRESSION: 1. Soft tissue edema in the subcutaneous fat along the dorsal and ulnar aspect of the forearm extending into the anterior half of the right upper arm to the level of the axilla most concerning for cellulitis. No drainable fluid collection to suggest an abscess. Electronically Signed   By: Kathreen Devoid   On: 06/25/2017 19:06     Subjective: Patient seen and  examined at bedside. He feels much better and thinks his right upper extremity redness is improving including the swelling. He wants to go home today.  Discharge Exam: Vitals:   06/26/17 2033 06/27/17 0509  BP: (!) 160/87 140/79  Pulse: 85 66  Resp: 20 17  Temp: 99.6 F (37.6 C) 98.2 F (36.8 C)  SpO2: 96% 95%   Vitals:   06/26/17 0607 06/26/17 1357 06/26/17 2033 06/27/17 0509  BP: (!) 158/71 (!) 151/78 (!) 160/87 140/79  Pulse: 70 80 85 66  Resp: 16 18 20 17   Temp: 97.9 F (36.6 C) 98 F (36.7 C) 99.6 F (37.6 C) 98.2 F (36.8 C)  TempSrc: Oral Oral Oral Oral  SpO2: 99% 98% 96% 95%  Weight:        General: Pt is alert, awake, not in acute distress Cardiovascular: Rate controlled, S1/S2 + Respiratory: Bilateral decreased breath sounds at bases Abdominal: Soft, NT, ND, bowel sounds + Extremities: no edema, no cyanosis; right upper extremity swelling is improving with improving erythema    The results of significant diagnostics from this hospitalization (including imaging, microbiology, ancillary and laboratory) are listed below for reference.     Microbiology: Recent Results (from the past 240 hour(s))  Blood Culture (routine x 2)     Status: None (Preliminary result)   Collection Time: 06/23/17  4:04 PM  Result Value Ref Range Status   Specimen Description BLOOD LEFT ANTECUBITAL  Final   Special Requests   Final    BOTTLES DRAWN AEROBIC AND ANAEROBIC Blood Culture adequate volume   Culture NO GROWTH 3 DAYS  Final   Report Status PENDING  Incomplete  Blood Culture (routine x 2)     Status: None (Preliminary result)   Collection Time: 06/23/17  4:09 PM  Result Value Ref Range Status   Specimen Description BLOOD LEFT HAND  Final   Special Requests Blood Culture adequate volume  Final   Culture NO GROWTH 3 DAYS  Final   Report Status PENDING  Incomplete     Labs: BNP (last 3 results) No results for input(s): BNP in the last 8760 hours. Basic Metabolic  Panel: Recent Labs  Lab 06/23/17 1548 06/23/17 1911 06/24/17 0512 06/25/17 0431  NA 130*  --  136 136  K 4.4  --  3.8 3.8  CL 100*  --  108 107  CO2 18*  --  20* 23  GLUCOSE 85  --  79 87  BUN 16  --  11 6  CREATININE 1.09 1.08 1.07 0.96  CALCIUM 9.1  --  8.5* 8.7*  Liver Function Tests: Recent Labs  Lab 06/23/17 1548  AST 37  ALT 28  ALKPHOS 79  BILITOT 1.1  PROT 7.4  ALBUMIN 4.5   No results for input(s): LIPASE, AMYLASE in the last 168 hours. No results for input(s): AMMONIA in the last 168 hours. CBC: Recent Labs  Lab 06/23/17 1548 06/23/17 1911 06/24/17 0512 06/25/17 0431  WBC 17.2* 12.7* 9.4 8.0  NEUTROABS 13.4*  --   --   --   HGB 14.9 12.0* 12.7* 11.7*  HCT 43.2 35.2* 38.6* 36.0*  MCV 93.5 93.1 93.5 94.5  PLT 201 188 152 165   Cardiac Enzymes: No results for input(s): CKTOTAL, CKMB, CKMBINDEX, TROPONINI in the last 168 hours. BNP: Invalid input(s): POCBNP CBG: No results for input(s): GLUCAP in the last 168 hours. D-Dimer No results for input(s): DDIMER in the last 72 hours. Hgb A1c No results for input(s): HGBA1C in the last 72 hours. Lipid Profile No results for input(s): CHOL, HDL, LDLCALC, TRIG, CHOLHDL, LDLDIRECT in the last 72 hours. Thyroid function studies No results for input(s): TSH, T4TOTAL, T3FREE, THYROIDAB in the last 72 hours.  Invalid input(s): FREET3 Anemia work up No results for input(s): VITAMINB12, FOLATE, FERRITIN, TIBC, IRON, RETICCTPCT in the last 72 hours. Urinalysis    Component Value Date/Time   COLORURINE AMBER (A) 07/31/2014 2129   APPEARANCEUR CLOUDY (A) 07/31/2014 2129   LABSPEC 1.025 07/31/2014 2129   PHURINE 5.0 07/31/2014 2129   GLUCOSEU NEGATIVE 07/31/2014 2129   HGBUR NEGATIVE 07/31/2014 2129   BILIRUBINUR n 06/27/2016 1524   KETONESUR 15 (A) 07/31/2014 2129   PROTEINUR n 06/27/2016 1524   PROTEINUR NEGATIVE 07/31/2014 2129   UROBILINOGEN 0.2 06/27/2016 1524   UROBILINOGEN 0.2 07/31/2014 2129    NITRITE n 06/27/2016 1524   NITRITE NEGATIVE 07/31/2014 2129   LEUKOCYTESUR Negative 06/27/2016 1524   Sepsis Labs Invalid input(s): PROCALCITONIN,  WBC,  LACTICIDVEN Microbiology Recent Results (from the past 240 hour(s))  Blood Culture (routine x 2)     Status: None (Preliminary result)   Collection Time: 06/23/17  4:04 PM  Result Value Ref Range Status   Specimen Description BLOOD LEFT ANTECUBITAL  Final   Special Requests   Final    BOTTLES DRAWN AEROBIC AND ANAEROBIC Blood Culture adequate volume   Culture NO GROWTH 3 DAYS  Final   Report Status PENDING  Incomplete  Blood Culture (routine x 2)     Status: None (Preliminary result)   Collection Time: 06/23/17  4:09 PM  Result Value Ref Range Status   Specimen Description BLOOD LEFT HAND  Final   Special Requests Blood Culture adequate volume  Final   Culture NO GROWTH 3 DAYS  Final   Report Status PENDING  Incomplete     Time coordinating discharge: 35 minutes  SIGNED:   Aline August, MD  [facility] 06/27/2017, 2:35 PM Pager: [phone]  If 7PM-7AM, please contact night-coverage [email or web address] Password [email or web address]

## 2017-06-27 NOTE — Care Management Important Message (Signed)
Important Message  Patient Details  Name: Aaron Black MRN: [id number] Date of Birth: [birth year]   Medicare Important Message Given:  Yes    [name] 06/27/2017, 9:28 AM

## 2017-06-27 NOTE — Progress Notes (Signed)
[facility]  Date of Admission:  06/23/2017     Total days of antibiotics 4   Day 2 Unasyn  Day 2 Azithromycin     Patient ID: Aaron Black is a 61 y.o. male admitted to the hospital with right arm pain and redness after known scratch from kitten.   Active Problems:   HLD (hyperlipidemia)   Gout   Anemia, unspecified   Essential hypertension   Tobacco abuse   Cellulitis   Sepsis ([place])   . allopurinol  100 mg Oral Daily  . ALPRAZolam  0.5 mg Oral TID  . atorvastatin  20 mg Oral q1800  . azithromycin  250 mg Oral Daily  . diltiazem  360 mg Oral Daily  . enoxaparin (LOVENOX) injection  40 mg Subcutaneous Q24H  . losartan  100 mg Oral Daily  . pantoprazole  40 mg Oral Daily  . sertraline  50 mg Oral Daily  . vitamin B-12  1,000 mcg Oral Daily    SUBJECTIVE: Reports the swelling and pain in his axilla has resolved. Swelling surrounding elbow and forearm improved and "not nearly as tight." He and his wife tell me today that he has a history of cdiff infection after amoxicillin that required hospitalization and he is worried about this recurring. Currently reports having diarrhea ~3 per day to which he has had since starting antibiotics; he states that this diarrhea is 'different than when he had cdiff and not nearly as bad'. No abdominal pain or nausea and is able to eat/drink well. Does report a red rash to scrotum. No significant fevers (TMax overnight 99.6deg).   Review of Systems: Review of Systems  Constitutional: Negative for chills and fever.  HENT: Negative for tinnitus.   Eyes: Negative for blurred vision and photophobia.  Respiratory: Negative for cough and sputum production.   Cardiovascular: Negative for chest pain.  Gastrointestinal: Negative for diarrhea, nausea and vomiting.  Genitourinary: Negative for dysuria.  Skin: Negative for rash.  Neurological: Negative for headaches.  All other systems reviewed and are  negative.   Allergies  Allergen Reactions  . Lisinopril Swelling    Swollen Lip  . Percodan [Oxycodone-Aspirin] Rash    Percodan caused rash---but he can tolerate Percocet and aspirin on their own     OBJECTIVE: Vitals:   06/26/17 0607 06/26/17 1357 06/26/17 2033 06/27/17 0509  BP: (!) 158/71 (!) 151/78 (!) 160/87 140/79  Pulse: 70 80 85 66  Resp: 16 18 20 17   Temp: 97.9 F (36.6 C) 98 F (36.7 C) 99.6 F (37.6 C) 98.2 F (36.8 C)  TempSrc: Oral Oral Oral Oral  SpO2: 99% 98% 96% 95%  Weight:       Body mass index is 27.41 kg/m.  Physical Exam  Constitutional: He is oriented to person, place, and time and well-developed, well-nourished, and in no distress.  HENT:  Mouth/Throat: No oral lesions. Normal dentition. No dental caries.  Eyes: No scleral icterus.  Cardiovascular: Normal rate, regular rhythm and normal heart sounds.  Pulmonary/Chest: Effort normal and breath sounds normal.  Abdominal: Soft. He exhibits no distension. There is no tenderness.  Musculoskeletal: Normal range of motion.  No pain with ROM of right elbow. Some dependent swelling around forearm. Right arm now with less erythema with some areas noted to be clearing around edges. Improved inflammation/induration. No tenderness today. Small area of palpable purpura have evolved. Picture below.   Lymphadenopathy:    He  has no cervical adenopathy.    He has no axillary adenopathy.  No further pain with deep palpation into axillae.   Neurological: He is alert and oriented to person, place, and time.  Skin: Skin is warm and dry. No rash noted.  Psychiatric: Mood and affect normal.  Vitals reviewed.      Lab Results Lab Results  Component Value Date   WBC 8.0 06/25/2017   HGB 11.7 (L) 06/25/2017   HCT 36.0 (L) 06/25/2017   MCV 94.5 06/25/2017   PLT 165 06/25/2017    Lab Results  Component Value Date   CREATININE 0.96 06/25/2017   BUN 6 06/25/2017   NA 136 06/25/2017   K 3.8 06/25/2017   CL  107 06/25/2017   CO2 23 06/25/2017    Lab Results  Component Value Date   ALT 28 06/23/2017   AST 37 06/23/2017   ALKPHOS 79 06/23/2017   BILITOT 1.1 06/23/2017     Microbiology: Recent Results (from the past 240 hour(s))  Blood Culture (routine x 2)     Status: None (Preliminary result)   Collection Time: 06/23/17  4:04 PM  Result Value Ref Range Status   Specimen Description BLOOD LEFT ANTECUBITAL  Final   Special Requests   Final    BOTTLES DRAWN AEROBIC AND ANAEROBIC Blood Culture adequate volume   Culture NO GROWTH 3 DAYS  Final   Report Status PENDING  Incomplete  Blood Culture (routine x 2)     Status: None (Preliminary result)   Collection Time: 06/23/17  4:09 PM  Result Value Ref Range Status   Specimen Description BLOOD LEFT HAND  Final   Special Requests Blood Culture adequate volume  Final   Culture NO GROWTH 3 DAYS  Final   Report Status PENDING  Incomplete   ASSESSMENT & PLAN:  1. Cellulitis r/t Cat Scratch Encounter: erythema, warmth and inflammation improved; evolving with some palpable purpura today. ?vasculitis component with his presentation.   Will continue with azithromycin 250 mg daily x 4 more days   Instead of Augmentin PO would finish out 6 more days for total of 10 with Doxycycline 100 mg BID + Flagyl 500 mg TID    Follow up with PCP --> we are available as needed. Discussed that should he worsen or symptoms do not improve may benefit from skin biopsy.   2. History of C Diff infection: history after amoxicillin dose  Will adjust abx accordingly for #1 due to this to lessen his risk   Precautions given for low threshold to notify his PCP for evaluation should he have worsening course of diarrhea > 3 watery BMs daily or emergence of new symptoms (fever, nausea, cramping).   3. Perineal erythema: likely related to diarrhea.   Would consider good cleansing agent after episodes and barrier cream to prevent excoriation from stool.   Janene Madeira,  MSN, NP-C [facility] [place] Cell: [phone] Pager: [phone]  06/27/2017  9:43 AM

## 2017-06-27 NOTE — Plan of Care (Signed)
  Progressing Fluid Volume: Hemodynamic stability will improve 06/27/2017 1215 - Progressing by Rance Muir, RN Clinical Measurements: Diagnostic test results will improve 06/27/2017 1215 - Progressing by Rance Muir, RN Signs and symptoms of infection will decrease 06/27/2017 1215 - Progressing by Rance Muir, RN Respiratory: Ability to maintain adequate ventilation will improve 06/27/2017 1215 - Progressing by Rance Muir, RN Education: Knowledge of General Education information will improve 06/27/2017 1215 - Progressing by Rance Muir, RN Health Behavior/Discharge Planning: Ability to manage health-related needs will improve 06/27/2017 1215 - Progressing by Rance Muir, RN Clinical Measurements: Ability to maintain clinical measurements within normal limits will improve 06/27/2017 1215 - Progressing by Rance Muir, RN Will remain free from infection 06/27/2017 1215 - Progressing by Rance Muir, RN Diagnostic test results will improve 06/27/2017 1215 - Progressing by Rance Muir, RN Respiratory complications will improve 06/27/2017 1215 - Progressing by Rance Muir, RN Cardiovascular complication will be avoided 06/27/2017 1215 - Progressing by Rance Muir, RN Activity: Risk for activity intolerance will decrease 06/27/2017 1215 - Progressing by Rance Muir, RN Nutrition: Adequate nutrition will be maintained 06/27/2017 1215 - Progressing by Rance Muir, RN Coping: Level of anxiety will decrease 06/27/2017 1215 - Progressing by Rance Muir, RN Elimination: Will not experience complications related to bowel motility 06/27/2017 1215 - Progressing by Rance Muir, RN Will not experience complications related to urinary retention 06/27/2017 1215 - Progressing by Rance Muir, RN Pain Managment: General experience of comfort will improve 06/27/2017 1215 - Progressing by Rance Muir, RN Safety: Ability to remain free from injury will improve 06/27/2017 1215 - Progressing by Rance Muir, RN Skin Integrity: Risk for impaired  skin integrity will decrease 06/27/2017 1215 - Progressing by Rance Muir, RN Clinical Measurements: Ability to avoid or minimize complications of infection will improve 06/27/2017 1215 - Progressing by Rance Muir, RN Skin Integrity: Skin integrity will improve 06/27/2017 1215 - Progressing by Rance Muir, RN

## 2017-06-28 ENCOUNTER — Telehealth: Payer: Self-pay | Admitting: Family Medicine

## 2017-06-28 LAB — CULTURE, BLOOD (ROUTINE X 2)
CULTURE: NO GROWTH
CULTURE: NO GROWTH
SPECIAL REQUESTS: ADEQUATE
Special Requests: ADEQUATE

## 2017-06-28 NOTE — Telephone Encounter (Signed)
Transition Care Management Follow-up Telephone Call  Admit date: 06/23/2017 Discharge date: 06/27/2017  Admitted From: Home Disposition:  Home  Recommendations for Outpatient Follow-up:  1. Follow up with PCP in 1 week 2. Follow-up with infectious diseases if needed as an outpatient   Home Health: No  Equipment/Devices: None  Discharge Condition: Stable --    How have you been since you were released from the hospital? " feeling & sleeping better "   Do you understand why you were in the hospital? yes   Do you understand the discharge instructions? yes   Where were you discharged to? home   Items Reviewed:  Medications reviewed: yes  Allergies reviewed: yes  Dietary changes reviewed: no  Referrals reviewed: yes   Functional Questionnaire:   Activities of Daily Living (ADLs):   He states they are independent in the following: ambulation, bathing and hygiene, feeding, continence, grooming, toileting and dressing States they require assistance with the following: none   Any transportation issues/concerns?: no   Any patient concerns? no   Confirmed importance and date/time of follow-up visits scheduled yes  Provider Appointment booked with Dr. Burnice Logan on 07/10/2017 at 11:15 am      Confirmed with patient if condition begins to worsen call PCP or go to the ER.  Patient was given the office number and encouraged to call back with question or concerns.  : yes

## 2017-06-30 ENCOUNTER — Other Ambulatory Visit: Payer: Self-pay | Admitting: Internal Medicine

## 2017-07-10 ENCOUNTER — Ambulatory Visit (INDEPENDENT_AMBULATORY_CARE_PROVIDER_SITE_OTHER): Payer: 59 | Admitting: Internal Medicine

## 2017-07-10 ENCOUNTER — Encounter: Payer: Self-pay | Admitting: Internal Medicine

## 2017-07-10 VITALS — BP 162/80 | HR 77 | Temp 98.0°F | Ht 67.0 in | Wt 174.8 lb

## 2017-07-10 DIAGNOSIS — R197 Diarrhea, unspecified: Secondary | ICD-10-CM | POA: Diagnosis not present

## 2017-07-10 DIAGNOSIS — I10 Essential (primary) hypertension: Secondary | ICD-10-CM | POA: Diagnosis not present

## 2017-07-10 DIAGNOSIS — T3695XA Adverse effect of unspecified systemic antibiotic, initial encounter: Secondary | ICD-10-CM

## 2017-07-10 DIAGNOSIS — L03113 Cellulitis of right upper limb: Secondary | ICD-10-CM

## 2017-07-10 DIAGNOSIS — A419 Sepsis, unspecified organism: Secondary | ICD-10-CM

## 2017-07-10 MED ORDER — KETOCONAZOLE 2 % EX CREA: 1.0000 "application " | TOPICAL_CREAM | Freq: Two times a day (BID) | CUTANEOUS | 1 refills | Status: DC

## 2017-07-10 NOTE — Progress Notes (Signed)
Subjective:    Patient ID: Aaron Black, male    DOB: February 16, 1956, 61 y.o.   MRN: [id number]  HPI  Admit date: 06/23/2017 Discharge date: 06/27/2017   Brief/Interim Summary: 61 year old male with history of hypertension, tobacco abuse presented with right arm swelling and pain after being scratched by his 28-month-old kitten. Patient was initially started on vancomycin and Zosyn. CT of the arm did not show any drainable fluid collection. Antibiotics were changed to Unasyn. ID was consulted. Zithromax was added. Cellulitis is improving and ID has cleared the patient for discharge on oral doxycycline and Flagyl for 6 more days.  Discharge Diagnoses:  Active Problems:   HLD (hyperlipidemia)   Gout   Anemia, unspecified   Essential hypertension   Tobacco abuse   Cellulitis   Sepsis ([place])  61 year old patient who is seen today following a recent hospital discharge and for transitional care management. He was admitted to the hospital for treatment of cellulitis involving the left arm associated with sepsis syndrome.  It was unclear whether this was precipitated by a cat scratch or possibly an insect bite.  He received parenteral antibiotics and was discharged on oral doxycycline and metronidazole for an additional 6 days which she completed about 1 week ago he generally feels well.  His right arm has some postinflammatory dryness and hyperpigmentation but no pain or swelling.  He has developed a intertriginous rash in the groin region he generally feels well. He does have a history of essential hypertension.  He has had some mild diarrhea that has been fairly stable.  He does have a prior history of C. difficile colitis.  He has been using probiotics  Past Medical History:  Diagnosis Date  . Anemia   . Anxiety   . Arthritis   . B12 deficiency   . Cervical disc disease    a. 07/2003 ant cervical deomcpression and fusion C5-6/C6-7;  b. 09/2007 post cervical laminectomy C4-5 with scres  and arthrodesis C4-C7.  Marland Kitchen Chest pain at rest 02/25/2013  . Chronic lower back pain   . Complication of anesthesia    " i WOKE UP DURING A COLONOSCOPY "- 2008  . Depression   . Diverticulosis of colon   . GERD (gastroesophageal reflux disease)   . Gout   . Hyperlipidemia   . Hypertension   . Pleurisy   . Shortness of breath dyspnea    with exertion- "I cant exercise now"  . Sleep apnea    Patient denies  . Tobacco abuse    a. 40 yrs, 1.5-3 ppd over that time.     Social History   Socioeconomic History  . Marital status: Married    Spouse name: Not on file  . Number of children: Not on file  . Years of education: Not on file  . Highest education level: Not on file  Social Needs  . Financial resource strain: Not on file  . Food insecurity - worry: Not on file  . Food insecurity - inability: Not on file  . Transportation needs - medical: Not on file  . Transportation needs - non-medical: Not on file  Occupational History  . Not on file  Tobacco Use  . Smoking status: Heavy Tobacco Smoker    Packs/day: 0.50    Years: 40.00    Pack years: 20.00    Types: Cigarettes    Last attempt to quit: 02/24/2013    Years since quitting: 4.3  . Smokeless tobacco: Never Used  . Tobacco  comment: Currently smoking 1.5 ppd.  Has previously smoked up to 3 ppd and did so for about 20 yrs.  Substance and Sexual Activity  . Alcohol use: Yes    Comment: at least 3 beers/night.- 01/20/16  . Drug use: No  . Sexual activity: Not on file  Other Topics Concern  . Not on file  Social History Narrative   Lives in [place] with wife.  Does not work.  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    Past Surgical History:  Procedure Laterality Date  . ANTERIOR LATERAL LUMBAR FUSION 4 LEVELS Right 06/05/2014   Procedure: ANTERIOR LATERAL LUMBAR FUSION  LUMBAR ONE TO LUMBAR FIVE with Percutaneous Pedicle Screws;  Surgeon: Erline Levine, MD;  Location: [facility];  Service: Neurosurgery;  Laterality:  Right;  . CARPAL TUNNEL RELEASE Right   . CERVICAL DISC SURGERY     X 2  . COLONOSCOPY    . ELBOW ARTHROSCOPY Right   . KNEE ARTHROSCOPY Right 08/2013   torn menicus  . KNEE ARTHROSCOPY Left 11/2013   chip [place],torn menicus  . LUMBAR LAMINECTOMY/DECOMPRESSION MICRODISCECTOMY Right 07/11/2013   Procedure: LUMBAR LAMINECTOMY/DECOMPRESSION MICRODISCECTOMY 1 LEVEL;  Surgeon: Erline Levine, MD;  Location: [facility];  Service: Neurosurgery;  Laterality: Right;  Right L34 microdiskectomy  . LUMBAR PERCUTANEOUS PEDICLE SCREW 4 LEVEL N/A 06/05/2014   Procedure: LUMBAR PERCUTANEOUS PEDICLE SCREW 4 LEVEL;  Surgeon: Erline Levine, MD;  Location: [facility];  Service: Neurosurgery;  Laterality: N/A;  . NECK SURGERY  January 2005, March 2009   C-Spine  . ROTATOR CUFF REPAIR  August 2000, January 2002, July 2008, July 2009   2 left 2 right    Family History  Problem Relation Age of Onset  . Bone cancer Mother   . Squamous cell carcinoma Mother        died @ 69  . Heart attack Mother   . Hypertension Father   . Diabetes Father        borderline  . Congestive Heart Failure Father        alive @ 6  . Heart failure Father   . Sudden death Brother        died @ 54  . Other Brother        died in [place] @ 33 - struck by drunk driver  . Other Brother        died in [place] @ 35 - struck by drunk driver  . Heart attack Brother   . Stroke Paternal Grandfather   . Colon cancer Neg Hx   . Esophageal cancer Neg Hx   . Rectal cancer Neg Hx   . Stomach cancer Neg Hx     Allergies  Allergen Reactions  . Lisinopril Swelling    Swollen Lip  . Percodan [Oxycodone-Aspirin] Rash    Percodan caused rash---but he can tolerate Percocet and aspirin on their own     Current Outpatient Medications on File Prior to Visit  Medication Sig Dispense Refill  . albuterol (PROVENTIL HFA;VENTOLIN HFA) 108 (90 Base) MCG/ACT inhaler Inhale 2 puffs into the lungs every 6 (six) hours as needed for wheezing or  shortness of breath. 1 Inhaler 3  . allopurinol (ZYLOPRIM) 100 MG tablet TAKE 1 TABLET DAILY (Patient taking differently: TAKE 100 mg TABLET DAILY) 90 tablet 1  . ALPRAZolam (XANAX) 0.5 MG tablet TAKE 1 TABLET BY MOUTH 3 TIMES A DAY 90 tablet 2  . atorvastatin (LIPITOR) 20 MG tablet TAKE  1 TABLET (20 MG TOTAL) BY MOUTH DAILY. 90 tablet 1  . colchicine 0.6 MG tablet Take 1 tablet (0.6 mg total) by mouth daily as needed (GOUT). 30 tablet 0  . cyclobenzaprine (FLEXERIL) 10 MG tablet Take 1 tablet (10 mg total) by mouth 2 (two) times daily as needed for muscle spasms. 60 tablet 0  . diclofenac sodium (VOLTAREN) 1 % GEL Apply 1 application topically as needed.  0  . diltiazem (CARDIZEM CD) 360 MG 24 hr capsule Take 1 capsule (360 mg total) by mouth daily. 90 capsule 3  . losartan (COZAAR) 100 MG tablet Take 1 tablet (100 mg total) by mouth daily. 90 tablet 3  . omeprazole (PRILOSEC OTC) 20 MG tablet Take 2 tablets (40 mg total) by mouth daily.    . sertraline (ZOLOFT) 50 MG tablet TAKE 1 TABLET (50 MG TOTAL) BY MOUTH DAILY. 90 tablet 3  . vitamin B-12 (CYANOCOBALAMIN) 1000 MCG tablet Take 1,000 mcg by mouth daily.     No current facility-administered medications on file prior to visit.     BP (!) 162/80 (BP Location: Left Arm, Patient Position: Sitting, Cuff Size: Normal)   Pulse 77   Temp 98 F (36.7 C) (Oral)   Ht 5\' 7"  (1.702 m)   Wt 174 lb 12.8 oz (79.3 kg)   SpO2 97%   BMI 27.38 kg/m     Review of Systems  Constitutional: Negative for appetite change, chills, fatigue and fever.  HENT: Negative for congestion, dental problem, ear pain, hearing loss, sore throat, tinnitus, trouble swallowing and voice change.   Eyes: Negative for pain, discharge and visual disturbance.  Respiratory: Negative for cough, chest tightness, wheezing and stridor.   Cardiovascular: Negative for chest pain, palpitations and leg swelling.  Gastrointestinal: Negative for abdominal distention, abdominal pain,  blood in stool, constipation, diarrhea, nausea and vomiting.  Genitourinary: Negative for difficulty urinating, discharge, flank pain, genital sores, hematuria and urgency.  Musculoskeletal: Negative for arthralgias, back pain, gait problem, joint swelling, myalgias and neck stiffness.  Skin: Positive for rash and wound.  Neurological: Negative for dizziness, syncope, speech difficulty, weakness, numbness and headaches.  Hematological: Negative for adenopathy. Does not bruise/bleed easily.  Psychiatric/Behavioral: Negative for behavioral problems and dysphoric mood. The patient is not nervous/anxious.        Objective:   Physical Exam  Constitutional: He is oriented to person, place, and time. He appears well-developed. No distress.  Repeat blood pressure 138/78  HENT:  Head: Normocephalic.  Right Ear: External ear normal.  Left Ear: External ear normal.  Eyes: Conjunctivae and EOM are normal.  Neck: Normal range of motion.  Cardiovascular: Normal rate and normal heart sounds.  Pulmonary/Chest: Breath sounds normal.  Abdominal: Bowel sounds are normal.  Musculoskeletal: Normal range of motion. He exhibits no edema or tenderness.  Neurological: He is alert and oriented to person, place, and time.  Skin:  Right lower arm examined No tenderness erythema or swelling Some residual postinflammatory changes with hyperpigmentation and some dry flaky skin  Psychiatric: He has a normal mood and affect. His behavior is normal.          Assessment & Plan:  Status post cellulitis right lower arm with sepsis syndrome  Essential hypertension stable Dyslipidemia ongoing tobacco use  Antibiotic associated diarrhea.  Patient report any clinical worsening such as worsening diarrhea abdominal pain fever or constitutional complaints.  We will continue probiotics and advance diet  Follow-up in 3 months or sooner if needed  [facility]  Pilar Plate

## 2017-07-10 NOTE — Patient Instructions (Signed)
Skin care  Keep the affected area dry.  Do not apply ointments or creams that contain mineral oil or petroleum ingredients to your skin. These can make the condition worse.  Apply cool compresses to the affected areas.  Do not scratch your skin.  Do not take hot showers or baths. General instructions  Take over-the-counter and prescription medicines only as told by your health care provider.    Do not wear tight clothes. Wear comfortable, loose-fitting clothing.  Keep all follow-up visits as told by your health care provider. This is important. Contact a health care provider if:  You have a fever.  Your rash does not go away after 3-4 days.  Your rash gets worse or it is very itchy.  Your rash has pus or fluid coming from it.

## 2017-08-21 ENCOUNTER — Other Ambulatory Visit: Payer: Self-pay | Admitting: Internal Medicine

## 2017-08-21 MED ORDER — SERTRALINE HCL 50 MG PO TABS: 50.0000 mg | ORAL_TABLET | Freq: Every day | ORAL | 3 refills | Status: DC

## 2017-09-14 ENCOUNTER — Ambulatory Visit (INDEPENDENT_AMBULATORY_CARE_PROVIDER_SITE_OTHER)
Admission: RE | Admit: 2017-09-14 | Discharge: 2017-09-14 | Disposition: A | Discharge status: Home / Self Care | Payer: 59 | Source: Ambulatory Visit | Attending: Acute Care | Admitting: Acute Care

## 2017-09-14 DIAGNOSIS — F17210 Nicotine dependence, cigarettes, uncomplicated: Secondary | ICD-10-CM | POA: Diagnosis not present

## 2017-09-19 ENCOUNTER — Other Ambulatory Visit: Payer: Self-pay | Admitting: Acute Care

## 2017-09-19 DIAGNOSIS — Z122 Encounter for screening for malignant neoplasm of respiratory organs: Secondary | ICD-10-CM

## 2017-09-19 DIAGNOSIS — F17210 Nicotine dependence, cigarettes, uncomplicated: Principal | ICD-10-CM

## 2017-10-07 ENCOUNTER — Other Ambulatory Visit: Payer: Self-pay | Admitting: Internal Medicine

## 2017-10-08 ENCOUNTER — Other Ambulatory Visit: Payer: Self-pay | Admitting: Internal Medicine

## 2017-10-17 ENCOUNTER — Other Ambulatory Visit: Payer: Self-pay | Admitting: Internal Medicine

## 2017-10-22 ENCOUNTER — Telehealth: Payer: Self-pay | Admitting: Internal Medicine

## 2017-10-22 ENCOUNTER — Other Ambulatory Visit: Payer: Self-pay

## 2017-10-22 DIAGNOSIS — L03114 Cellulitis of left upper limb: Secondary | ICD-10-CM

## 2017-10-22 HISTORY — DX: Cellulitis of left upper limb: L03.114

## 2017-10-22 MED ORDER — ALPRAZOLAM 0.5 MG PO TABS: 0.5000 mg | ORAL_TABLET | Freq: Three times a day (TID) | ORAL | 0 refills | Status: DC

## 2017-10-22 NOTE — Telephone Encounter (Signed)
Spoke to patient and  medication was faxed in.

## 2017-10-22 NOTE — Telephone Encounter (Signed)
Copied from [place] [id number]. Topic: Inquiry >> Oct 22, 2017 12:50 PM Cecelia Byars, NT wrote: Reason for CRM: The patient called to find out why his prescription ALPRAZolam (XANAX) 0.5 MG tablet  was called in for 15 tablets instead of the usual 90 day supply please advise , [phone]

## 2017-10-30 ENCOUNTER — Inpatient Hospital Stay (HOSPITAL_COMMUNITY)
Admission: EM | Admit: 2017-10-30 | Discharge: 2017-11-02 | DRG: 603 | Disposition: A | Discharge status: Home / Self Care | Payer: 59 | Attending: Internal Medicine | Admitting: Internal Medicine

## 2017-10-30 ENCOUNTER — Encounter (HOSPITAL_COMMUNITY): Payer: Self-pay | Admitting: Emergency Medicine

## 2017-10-30 ENCOUNTER — Other Ambulatory Visit: Payer: Self-pay

## 2017-10-30 DIAGNOSIS — I10 Essential (primary) hypertension: Secondary | ICD-10-CM | POA: Diagnosis present

## 2017-10-30 DIAGNOSIS — G8929 Other chronic pain: Secondary | ICD-10-CM | POA: Diagnosis present

## 2017-10-30 DIAGNOSIS — F419 Anxiety disorder, unspecified: Secondary | ICD-10-CM | POA: Diagnosis present

## 2017-10-30 DIAGNOSIS — E871 Hypo-osmolality and hyponatremia: Secondary | ICD-10-CM | POA: Diagnosis present

## 2017-10-30 DIAGNOSIS — Z8249 Family history of ischemic heart disease and other diseases of the circulatory system: Secondary | ICD-10-CM | POA: Diagnosis not present

## 2017-10-30 DIAGNOSIS — F17210 Nicotine dependence, cigarettes, uncomplicated: Secondary | ICD-10-CM | POA: Diagnosis present

## 2017-10-30 DIAGNOSIS — D649 Anemia, unspecified: Secondary | ICD-10-CM | POA: Diagnosis present

## 2017-10-30 DIAGNOSIS — E785 Hyperlipidemia, unspecified: Secondary | ICD-10-CM | POA: Diagnosis present

## 2017-10-30 DIAGNOSIS — Z981 Arthrodesis status: Secondary | ICD-10-CM | POA: Diagnosis not present

## 2017-10-30 DIAGNOSIS — R197 Diarrhea, unspecified: Secondary | ICD-10-CM | POA: Diagnosis not present

## 2017-10-30 DIAGNOSIS — Z885 Allergy status to narcotic agent status: Secondary | ICD-10-CM | POA: Diagnosis not present

## 2017-10-30 DIAGNOSIS — W5501XA Bitten by cat, initial encounter: Secondary | ICD-10-CM

## 2017-10-30 DIAGNOSIS — K219 Gastro-esophageal reflux disease without esophagitis: Secondary | ICD-10-CM | POA: Diagnosis present

## 2017-10-30 DIAGNOSIS — Z79899 Other long term (current) drug therapy: Secondary | ICD-10-CM

## 2017-10-30 DIAGNOSIS — L03114 Cellulitis of left upper limb: Principal | ICD-10-CM | POA: Diagnosis present

## 2017-10-30 DIAGNOSIS — F329 Major depressive disorder, single episode, unspecified: Secondary | ICD-10-CM | POA: Diagnosis present

## 2017-10-30 DIAGNOSIS — T3695XA Adverse effect of unspecified systemic antibiotic, initial encounter: Secondary | ICD-10-CM | POA: Diagnosis not present

## 2017-10-30 DIAGNOSIS — K521 Toxic gastroenteritis and colitis: Secondary | ICD-10-CM | POA: Diagnosis not present

## 2017-10-30 DIAGNOSIS — Z716 Tobacco abuse counseling: Secondary | ICD-10-CM

## 2017-10-30 DIAGNOSIS — M109 Gout, unspecified: Secondary | ICD-10-CM | POA: Diagnosis present

## 2017-10-30 DIAGNOSIS — M545 Low back pain: Secondary | ICD-10-CM | POA: Diagnosis present

## 2017-10-30 DIAGNOSIS — E538 Deficiency of other specified B group vitamins: Secondary | ICD-10-CM | POA: Diagnosis present

## 2017-10-30 DIAGNOSIS — Z720 Tobacco use: Secondary | ICD-10-CM | POA: Diagnosis not present

## 2017-10-30 DIAGNOSIS — Z888 Allergy status to other drugs, medicaments and biological substances status: Secondary | ICD-10-CM

## 2017-10-30 HISTORY — DX: Cellulitis of left upper limb: L03.114

## 2017-10-30 LAB — CBC WITH DIFFERENTIAL/PLATELET
BASOS ABS: 0 10*3/uL (ref 0.0–0.1)
BASOS PCT: 0 %
EOS ABS: 0.2 10*3/uL (ref 0.0–0.7)
Eosinophils Relative: 2 %
HCT: 38.7 % — ABNORMAL LOW (ref 39.0–52.0)
Hemoglobin: 13 g/dL (ref 13.0–17.0)
Lymphocytes Relative: 19 %
Lymphs Abs: 2.4 10*3/uL (ref 0.7–4.0)
MCH: 31 pg (ref 26.0–34.0)
MCHC: 33.6 g/dL (ref 30.0–36.0)
MCV: 92.1 fL (ref 78.0–100.0)
MONO ABS: 0.9 10*3/uL (ref 0.1–1.0)
MONOS PCT: 7 %
NEUTROS PCT: 72 %
Neutro Abs: 9.5 10*3/uL — ABNORMAL HIGH (ref 1.7–7.7)
Platelets: 239 10*3/uL (ref 150–400)
RBC: 4.2 MIL/uL — ABNORMAL LOW (ref 4.22–5.81)
RDW: 14.3 % (ref 11.5–15.5)
WBC: 13 10*3/uL — ABNORMAL HIGH (ref 4.0–10.5)

## 2017-10-30 LAB — BASIC METABOLIC PANEL
Anion gap: 15 (ref 5–15)
BUN: 13 mg/dL (ref 6–20)
CALCIUM: 9 mg/dL (ref 8.9–10.3)
CO2: 17 mmol/L — ABNORMAL LOW (ref 22–32)
CREATININE: 1.19 mg/dL (ref 0.61–1.24)
Chloride: 100 mmol/L — ABNORMAL LOW (ref 101–111)
Glucose, Bld: 90 mg/dL (ref 65–99)
Potassium: 3.8 mmol/L (ref 3.5–5.1)
SODIUM: 132 mmol/L — AB (ref 135–145)

## 2017-10-30 LAB — I-STAT CG4 LACTIC ACID, ED: LACTIC ACID, VENOUS: 2.1 mmol/L — AB (ref 0.5–1.9)

## 2017-10-30 MED ORDER — SODIUM CHLORIDE 0.9 % IV SOLN
3.0000 g | Freq: Four times a day (QID) | INTRAVENOUS | Status: DC
  Administered 2017-10-31 – 2017-11-02 (×10): 3 g via INTRAVENOUS
  Filled 2017-10-30 (×13): qty 3

## 2017-10-30 MED ORDER — SODIUM CHLORIDE 0.9 % IV BOLUS
1000.0000 mL | Freq: Once | INTRAVENOUS | Status: AC
  Administered 2017-10-30: 1000 mL via INTRAVENOUS

## 2017-10-30 MED ORDER — HYDROCODONE-ACETAMINOPHEN 5-325 MG PO TABS
1.0000 | ORAL_TABLET | Freq: Once | ORAL | Status: AC
  Administered 2017-10-30: 1 via ORAL
  Filled 2017-10-30: qty 1

## 2017-10-30 MED ORDER — CEFAZOLIN SODIUM-DEXTROSE 1-4 GM/50ML-% IV SOLN
1.0000 g | Freq: Once | INTRAVENOUS | Status: AC
  Administered 2017-10-30: 1 g via INTRAVENOUS
  Filled 2017-10-30: qty 50

## 2017-10-30 NOTE — ED Provider Notes (Signed)
Patient placed in Quick Look pathway, seen and evaluated   Chief Complaint: Left arm pain  HPI:   Patient states he was bitten by his cat 3 days ago, also states he was doing some yard work yesterday, noticed redness to the left forearm.  Since yesterday, his redness has spread up his forearm and upper arm into the left axilla.  Reports severe pain to the left forearm and left axilla.  Denies any fever or chills.  Reports being scratched by the same cat few years ago and had to stay in the hospital for cellulitis.   ROS: Positive for left arm pain, positive discoloration, positive for arms redness and streaking  Physical Exam:   Gen: No distress  Neuro: Awake and Alert  Skin: Warm    Focused Exam: Erythema to the left forearm, tender to palpation, warm.  There is streaking to anterior and posterior left forearm and upper arm into the axilla.  Tenderness to palpation in the axilla as well.  Cellulitis with streaking up to the axilla after possible cat bite versus yard work related bite or injury.  Will get basic labs.  Vicodin given for pain.  Patient will have to go back in the waiting room, once in the room will need IV antibiotics and probable admission   Initiation of care has begun. The patient has been counseled on the process, plan, and necessity for staying for the completion/evaluation, and the remainder of the medical screening examination    Janee Morn 10/30/17 1900    Quintella Reichert, MD 10/30/17 1910

## 2017-10-30 NOTE — ED Triage Notes (Signed)
Pt st's he was bitten by his cat yesterday on left arm.  Today area is red with streaks going up arm  Pt also c/o pain in left arm pit

## 2017-10-30 NOTE — ED Provider Notes (Signed)
[facility] EMERGENCY DEPARTMENT Provider Note   CSN: [id number] Arrival date & time: 10/30/17  1746     History   Chief Complaint Chief Complaint  Patient presents with  . Arm Pain    HPI TANK DIFIORE is a 62 y.o. male.  The history is provided by the patient. No language interpreter was used.    CONWAY FEDORA is a 62 y.o. male who presents to the Emergency Department complaining of arm pain. He noticed a scratching and itching to his left forearm yesterday. He thinks he might've been stung by something yesterday while doing yard work. Today he has had swelling and redness extending up his arm and into his axilla. He denies any fevers, chills. He has a history of arm infection in the past after being scratched by a cat that required admission for IV antibiotics. This appears similar to his prior event but he did not get a scratch or bite  this time.  Past Medical History:  Diagnosis Date  . Anemia   . Anxiety   . Arthritis   . B12 deficiency   . Cervical disc disease    a. 07/2003 ant cervical deomcpression and fusion C5-6/C6-7;  b. 09/2007 post cervical laminectomy C4-5 with scres and arthrodesis C4-C7.  Marland Kitchen Chest pain at rest 02/25/2013  . Chronic lower back pain   . Complication of anesthesia    " i WOKE UP DURING A COLONOSCOPY "- 2008  . Depression   . Diverticulosis of colon   . GERD (gastroesophageal reflux disease)   . Gout   . Hyperlipidemia   . Hypertension   . Pleurisy   . Shortness of breath dyspnea    with exertion- "I cant exercise now"  . Sleep apnea    Patient denies  . Tobacco abuse    a. 40 yrs, 1.5-3 ppd over that time.    Patient Active Problem List   Diagnosis Date Noted  . Sepsis ([place]) 06/24/2017  . Cellulitis 06/23/2017  . Pseudoarthrosis of lumbar spine 01/21/2016  . Lumbar spine scoliosis 06/05/2014  . Herniated lumbar disc without myelopathy 07/11/2013  . Chest pain at rest 02/26/2013  . Tobacco abuse 09/25/2011  .  Gout 05/27/2009  . VITAMIN B12 DEFICIENCY 09/08/2008  . HLD (hyperlipidemia) 09/08/2008  . COLONIC POLYPS, HX OF 09/08/2008  . Anemia, unspecified 06/30/2008  . ABDOMINAL PAIN-LLQ 06/30/2008  . ANXIETY 06/29/2008  . Essential hypertension 06/29/2008  . GERD 06/29/2008  . DIVERTICULOSIS, COLON 06/29/2008  . ARTHRITIS 06/29/2008  . HERNIATED DISC 06/29/2008    Past Surgical History:  Procedure Laterality Date  . ANTERIOR LATERAL LUMBAR FUSION 4 LEVELS Right 06/05/2014   Procedure: ANTERIOR LATERAL LUMBAR FUSION  LUMBAR ONE TO LUMBAR FIVE with Percutaneous Pedicle Screws;  Surgeon: Erline Levine, MD;  Location: [facility];  Service: Neurosurgery;  Laterality: Right;  . CARPAL TUNNEL RELEASE Right   . CERVICAL DISC SURGERY     X 2  . COLONOSCOPY    . ELBOW ARTHROSCOPY Right   . KNEE ARTHROSCOPY Right 08/2013   torn menicus  . KNEE ARTHROSCOPY Left 11/2013   chip cartlidge,torn menicus  . LUMBAR LAMINECTOMY/DECOMPRESSION MICRODISCECTOMY Right 07/11/2013   Procedure: LUMBAR LAMINECTOMY/DECOMPRESSION MICRODISCECTOMY 1 LEVEL;  Surgeon: Erline Levine, MD;  Location: [place] [facility];  Service: Neurosurgery;  Laterality: Right;  Right L34 microdiskectomy  . LUMBAR PERCUTANEOUS PEDICLE SCREW 4 LEVEL N/A 06/05/2014   Procedure: LUMBAR PERCUTANEOUS PEDICLE SCREW 4 LEVEL;  Surgeon: Erline Levine, MD;  Location: [facility] [facility];  Service: Neurosurgery;  Laterality: N/A;  . NECK SURGERY  January 2005, March 2009   C-Spine  . ROTATOR CUFF REPAIR  August 2000, January 2002, July 2008, July 2009   2 left 2 right        Home Medications    Prior to Admission medications   Medication Sig Start Date End Date Taking? Authorizing Provider  albuterol (PROVENTIL HFA;VENTOLIN HFA) 108 (90 Base) MCG/ACT inhaler Inhale 2 puffs into the lungs every 6 (six) hours as needed for wheezing or shortness of breath. 03/05/17  Yes Marletta Lor, MD  allopurinol (ZYLOPRIM) 100 MG tablet TAKE 1 TABLET DAILY 10/09/17   Yes Marletta Lor, MD  ALPRAZolam Duanne Moron) 0.5 MG tablet Take 1 tablet (0.5 mg total) by mouth 3 (three) times daily. 10/22/17  Yes Marletta Lor, MD  atorvastatin (LIPITOR) 20 MG tablet TAKE 1 TABLET (20 MG TOTAL) BY MOUTH DAILY. 10/09/17  Yes Marletta Lor, MD  colchicine 0.6 MG tablet Take 1 tablet (0.6 mg total) by mouth daily as needed (GOUT). 02/14/16  Yes Marletta Lor, MD  diclofenac sodium (VOLTAREN) 1 % GEL Apply 2 g topically as needed (for pain).  04/01/17  Yes [provider]  diltiazem (CARDIZEM CD) 360 MG 24 hr capsule Take 1 capsule (360 mg total) by mouth daily. 03/07/17  Yes Marletta Lor, MD  HYDROcodone-acetaminophen (NORCO/VICODIN) 5-325 MG tablet Take 1 tablet by mouth every 8 (eight) hours as needed for moderate pain.  09/24/17  Yes [provider]  losartan (COZAAR) 100 MG tablet Take 1 tablet (100 mg total) by mouth daily. 03/05/17  Yes Marletta Lor, MD  omeprazole (PRILOSEC OTC) 20 MG tablet Take 2 tablets (40 mg total) by mouth daily. 02/27/13  Yes Viyuoh, Adeline C, MD  sertraline (ZOLOFT) 50 MG tablet Take 1 tablet (50 mg total) by mouth daily. 08/21/17  Yes Marletta Lor, MD  vitamin B-12 (CYANOCOBALAMIN) 1000 MCG tablet Take 1,000 mcg by mouth daily.   Yes [provider]  cyclobenzaprine (FLEXERIL) 10 MG tablet Take 1 tablet (10 mg total) by mouth 2 (two) times daily as needed for muscle spasms. Patient not taking: Reported on 10/30/2017 01/23/16   Kary Kos, MD  ketoconazole (NIZORAL) 2 % cream Apply 1 application topically 2 (two) times daily. Patient not taking: Reported on 10/30/2017 07/10/17   Marletta Lor, MD    Family History Family History  Problem Relation Age of Onset  . Bone cancer Mother   . Squamous cell carcinoma Mother        died @ 68  . Heart attack Mother   . Hypertension Father   . Diabetes Father        borderline  . Congestive Heart Failure Father        alive @ 4  .  Heart failure Father   . Sudden death Brother        died @ 63  . Other Brother        died in [place] @ 25 - struck by drunk driver  . Other Brother        died in MVA @ 71 - struck by drunk driver  . Heart attack Brother   . Stroke Paternal Grandfather   . Colon cancer Neg Hx   . Esophageal cancer Neg Hx   . Rectal cancer Neg Hx   . Stomach cancer Neg Hx     Social History Social History  Tobacco Use  . Smoking status: Heavy Tobacco Smoker    Packs/day: 0.50    Years: 40.00    Pack years: 20.00    Types: Cigarettes    Last attempt to quit: 02/24/2013    Years since quitting: 4.6  . Smokeless tobacco: Never Used  . Tobacco comment: Currently smoking 1.5 ppd.  Has previously smoked up to 3 ppd and did so for about 20 yrs.  Substance Use Topics  . Alcohol use: Yes    Comment: at least 3 beers/night.- 01/20/16  . Drug use: No     Allergies   Lisinopril and Percodan [oxycodone-aspirin]   Review of Systems Review of Systems  All other systems reviewed and are negative.    Physical Exam Updated Vital Signs BP (!) 115/92 (BP Location: Left Arm)   Pulse 72   Temp 98.9 F (37.2 C) (Oral)   Resp 18   Ht 5\' 8"  (1.727 m)   Wt 80.7 kg (178 lb)   SpO2 97%   BMI 27.06 kg/m   Physical Exam  Constitutional: He is oriented to person, place, and time. He appears well-developed and well-nourished.  HENT:  Head: Normocephalic and atraumatic.  Cardiovascular: Normal rate and regular rhythm.  No murmur heard. Pulmonary/Chest: Effort normal and breath sounds normal. No respiratory distress.  Abdominal: Soft. There is no tenderness. There is no rebound and no guarding.  Musculoskeletal:  Left upper extremity with 2+ radial pulses. There is erythema and edema to the left forearm. There is streaking and tenderness extending from patch on the forearm to the left axilla. Mild edema to forearm.  Neurological: He is alert and oriented to person, place, and time.  Skin: Skin is warm  and dry.  Psychiatric: He has a normal mood and affect. His behavior is normal.  Nursing note and vitals reviewed.    ED Treatments / Results  Labs (all labs ordered are listed, but only abnormal results are displayed) Labs Reviewed  CBC WITH DIFFERENTIAL/PLATELET - Abnormal; Notable for the following components:      Result Value   WBC 13.0 (*)    RBC 4.20 (*)    HCT 38.7 (*)    Neutro Abs 9.5 (*)    All other components within normal limits  BASIC METABOLIC PANEL - Abnormal; Notable for the following components:   Sodium 132 (*)    Chloride 100 (*)    CO2 17 (*)    All other components within normal limits  I-STAT CG4 LACTIC ACID, ED - Abnormal; Notable for the following components:   Lactic Acid, Venous 2.10 (*)    All other components within normal limits  CULTURE, BLOOD (ROUTINE X 2)  CULTURE, BLOOD (ROUTINE X 2)  I-STAT CG4 LACTIC ACID, ED    EKG None  Radiology No results found.  Procedures Procedures (including critical care time)  Medications Ordered in ED Medications  HYDROcodone-acetaminophen (NORCO/VICODIN) 5-325 MG per tablet 1 tablet (1 tablet Oral Given 10/30/17 1914)  ceFAZolin (ANCEF) IVPB 1 g/50 mL premix (1 g Intravenous New Bag/Given 10/30/17 2239)  sodium chloride 0.9 % bolus 1,000 mL (1,000 mLs Intravenous New Bag/Given 10/30/17 2239)     Initial Impression / Assessment and Plan / ED Course  I have reviewed the triage vital signs and the nursing notes.  Pertinent labs & imaging results that were available during my care of the patient were reviewed by me and considered in my medical decision making (see chart for details).  Patient here for evaluation of erythema and pain to left forearm. Examination consistent with cellulitis with extending lymphangitis. Treating with IV antibiotics. Hospitalist consulted for admission for further treatment.  Final Clinical Impressions(s) / ED Diagnoses   Final diagnoses:  None    ED Discharge Orders      None       Quintella Reichert, MD 10/30/17 2346

## 2017-10-30 NOTE — H&P (Addendum)
History and Physical    Aaron Black SVX:[id number] DOB: Nov 25, 1955 DOA: 10/30/2017  PCP: Marletta Lor, MD   Patient coming from: Home  Chief Complaint: Redness and pain left arm  HPI: Aaron Black is a 62 y.o. male with medical history significant for gout, HTN, diverticulosis who presented to the ED with complaints of left redness and pain of one day duration.  Patient reports he was bitten by his cat 3 days ago while he was playing with his cat, and redness and pain with streaking of redness or pain started 2 days later.  Patient denies fever or chills, no malaise, no nausea or vomiting.  Patient was admitted 06/23/18 - 06/27/18, for sepsis 2/2 cellulitis involving his right arm after he was scratched by his cat.  He was started on IV vancomycin and Zosyn, CT scan of the arm no drainable fluid collection to suggest an abscess.  With worsening cellulitis of his forearm, ID was consulted, and azithromycin was added.On discharge, patient had his cat de-clawed by his veterinarian, but with this hospital presentation patient had a cat bite.  ED Course: Stable vitals temperature 98.9.  WBC 13.  Lactic acid 2.1.  Sodium mildly low 132.  Patient was started on IV Vaseline in the ED. Patient was given 1 L bolus.  Hospitalist was called to admit for cellulitis.  Review of Systems: As per HPI otherwise 10 point review of systems negative.   Past Medical History:  Diagnosis Date  . Anemia   . Anxiety   . Arthritis   . B12 deficiency   . Cervical disc disease    a. 07/2003 ant cervical deomcpression and fusion C5-6/C6-7;  b. 09/2007 post cervical laminectomy C4-5 with scres and arthrodesis C4-C7.  Marland Kitchen Chest pain at rest 02/25/2013  . Chronic lower back pain   . Complication of anesthesia    " i WOKE UP DURING A COLONOSCOPY "- 2008  . Depression   . Diverticulosis of colon   . GERD (gastroesophageal reflux disease)   . Gout   . Hyperlipidemia   . Hypertension   . Pleurisy   .  Shortness of breath dyspnea    with exertion- "I cant exercise now"  . Sleep apnea    Patient denies  . Tobacco abuse    a. 40 yrs, 1.5-3 ppd over that time.    Past Surgical History:  Procedure Laterality Date  . ANTERIOR LATERAL LUMBAR FUSION 4 LEVELS Right 06/05/2014   Procedure: ANTERIOR LATERAL LUMBAR FUSION  LUMBAR ONE TO LUMBAR FIVE with Percutaneous Pedicle Screws;  Surgeon: Erline Levine, MD;  Location: [facility];  Service: Neurosurgery;  Laterality: Right;  . CARPAL TUNNEL RELEASE Right   . CERVICAL DISC SURGERY     X 2  . COLONOSCOPY    . ELBOW ARTHROSCOPY Right   . KNEE ARTHROSCOPY Right 08/2013   torn menicus  . KNEE ARTHROSCOPY Left 11/2013   chip cartlidge,torn menicus  . LUMBAR LAMINECTOMY/DECOMPRESSION MICRODISCECTOMY Right 07/11/2013   Procedure: LUMBAR LAMINECTOMY/DECOMPRESSION MICRODISCECTOMY 1 LEVEL;  Surgeon: Erline Levine, MD;  Location: [place] [facility];  Service: Neurosurgery;  Laterality: Right;  Right L34 microdiskectomy  . LUMBAR PERCUTANEOUS PEDICLE SCREW 4 LEVEL N/A 06/05/2014   Procedure: LUMBAR PERCUTANEOUS PEDICLE SCREW 4 LEVEL;  Surgeon: Erline Levine, MD;  Location: [facility];  Service: Neurosurgery;  Laterality: N/A;  . NECK SURGERY  January 2005, March 2009   C-Spine  . ROTATOR CUFF REPAIR  August 2000, January 2002, July  2008, July 2009   2 left 2 right     reports that he has been smoking cigarettes.  He has a 20.00 pack-year smoking history. He has never used smokeless tobacco. He reports that he drinks alcohol. He reports that he does not use drugs.  Allergies  Allergen Reactions  . Lisinopril Swelling    Swollen Lip  . Percodan [Oxycodone-Aspirin] Rash    Percodan caused rash---but he can tolerate Percocet and aspirin on their own     Family History  Problem Relation Age of Onset  . Bone cancer Mother   . Squamous cell carcinoma Mother        died @ 85  . Heart attack Mother   . Hypertension Father   . Diabetes Father         borderline  . Congestive Heart Failure Father        alive @ 91  . Heart failure Father   . Sudden death Brother        died @ 29  . Other Brother        died in [place] @ 2 - struck by drunk driver  . Other Brother        died in MVA @ 105 - struck by drunk driver  . Heart attack Brother   . Stroke Paternal Grandfather   . Colon cancer Neg Hx   . Esophageal cancer Neg Hx   . Rectal cancer Neg Hx   . Stomach cancer Neg Hx     Prior to Admission medications   Medication Sig Start Date End Date Taking? Authorizing Provider  albuterol (PROVENTIL HFA;VENTOLIN HFA) 108 (90 Base) MCG/ACT inhaler Inhale 2 puffs into the lungs every 6 (six) hours as needed for wheezing or shortness of breath. 03/05/17  Yes Marletta Lor, MD  allopurinol (ZYLOPRIM) 100 MG tablet TAKE 1 TABLET DAILY 10/09/17  Yes Marletta Lor, MD  ALPRAZolam Duanne Moron) 0.5 MG tablet Take 1 tablet (0.5 mg total) by mouth 3 (three) times daily. 10/22/17  Yes Marletta Lor, MD  atorvastatin (LIPITOR) 20 MG tablet TAKE 1 TABLET (20 MG TOTAL) BY MOUTH DAILY. 10/09/17  Yes Marletta Lor, MD  colchicine 0.6 MG tablet Take 1 tablet (0.6 mg total) by mouth daily as needed (GOUT). 02/14/16  Yes Marletta Lor, MD  diclofenac sodium (VOLTAREN) 1 % GEL Apply 2 g topically as needed (for pain).  04/01/17  Yes [provider]  diltiazem (CARDIZEM CD) 360 MG 24 hr capsule Take 1 capsule (360 mg total) by mouth daily. 03/07/17  Yes Marletta Lor, MD  HYDROcodone-acetaminophen (NORCO/VICODIN) 5-325 MG tablet Take 1 tablet by mouth every 8 (eight) hours as needed for moderate pain.  09/24/17  Yes [provider]  losartan (COZAAR) 100 MG tablet Take 1 tablet (100 mg total) by mouth daily. 03/05/17  Yes Marletta Lor, MD  omeprazole (PRILOSEC OTC) 20 MG tablet Take 2 tablets (40 mg total) by mouth daily. 02/27/13  Yes Viyuoh, Adeline C, MD  sertraline (ZOLOFT) 50 MG tablet Take 1 tablet (50 mg total) by  mouth daily. 08/21/17  Yes Marletta Lor, MD  vitamin B-12 (CYANOCOBALAMIN) 1000 MCG tablet Take 1,000 mcg by mouth daily.   Yes [provider]  cyclobenzaprine (FLEXERIL) 10 MG tablet Take 1 tablet (10 mg total) by mouth 2 (two) times daily as needed for muscle spasms. Patient not taking: Reported on 10/30/2017 01/23/16   Kary Kos, MD  ketoconazole (NIZORAL) 2 %  cream Apply 1 application topically 2 (two) times daily. Patient not taking: Reported on 10/30/2017 07/10/17   Marletta Lor, MD    Physical Exam: Vitals:   10/30/17 1840 10/30/17 1844 10/30/17 2243  BP: 125/87  (!) 115/92  Pulse: 92  72  Resp: 18  18  Temp: 98.9 F (37.2 C)    TempSrc: Oral    SpO2: 96%  97%  Weight:  80.7 kg (178 lb)   Height:  5\' 8"  (1.727 m)     Constitutional: NAD, calm, comfortable Vitals:   10/30/17 1840 10/30/17 1844 10/30/17 2243  BP: 125/87  (!) 115/92  Pulse: 92  72  Resp: 18  18  Temp: 98.9 F (37.2 C)    TempSrc: Oral    SpO2: 96%  97%  Weight:  80.7 kg (178 lb)   Height:  5\' 8"  (1.727 m)    Eyes: PERRL, lids and conjunctivae normal ENMT: Mucous membranes are moist. Posterior pharynx clear of any exudate or lesions.Normal dentition.  Neck: normal, supple, no masses, no thyromegaly Respiratory: clear to auscultation bilaterally, no wheezing, no crackles. Normal respiratory effort. No accessory muscle use.  Cardiovascular: Regular rate and rhythm, no murmurs / rubs / gallops. No extremity edema. 2+ pedal pulses. No carotid bruits.  Abdomen: no tenderness, no masses palpated. No hepatosplenomegaly. Bowel sounds positive.  Musculoskeletal: no clubbing / cyanosis. No joint deformity upper and lower extremities. Good ROM, no contractures. Normal muscle tone.  Skin: Obvious animal bites-2 distinct marks on flexor surface of forearm with surrounding redness, tenderness, with faint streaking mostly on extensor surface extending towards axilla Neurologic: CN 2-12 grossly  intact. Strength 5/5 in all 4.  Psychiatric: Normal judgment and insight. Alert and oriented x 3. Normal mood.         Labs on Admission: I have personally reviewed following labs and imaging studies  CBC: Recent Labs  Lab 10/30/17 1858  WBC 13.0*  NEUTROABS 9.5*  HGB 13.0  HCT 38.7*  MCV 92.1  PLT 725   Basic Metabolic Panel: Recent Labs  Lab 10/30/17 1858  NA 132*  K 3.8  CL 100*  CO2 17*  GLUCOSE 90  BUN 13  CREATININE 1.19  CALCIUM 9.0   Radiological Exams on Admission: No results found.  EKG: None   Assessment/Plan Active Problems:   Gout   Essential hypertension   Cellulitis of left arm   Cellulitis of left forearm- 2/2 cat bite.  Patient does not meet sepsis criteria.  WBC- 13.  But lactic acid checked mildly elevated 2.1 >2.2.  IV cefazolin given in ED. 1L bolus also given. No hx of MRSA infection.  - Will start Unasyn to include anaerobic covearge. -f/u Blood cultures drwan in ED - norco PRN  - Will need o confirm patients tetanus immunization status in A.m - CBC a.m - repeat 1l bolus, and trend lactic acid  Gout- Appears stable - Cont home allorpurinol - Cont PRN Colchicine  HTN- Stable - Cont home lorsatan, diltiazem  DVT prophylaxis: Lovenox Code Status: Full Family Communication:None at bedside Disposition Plan: ~2 days Consults called: None  Admission status: inpt, med surg   Bethena Roys MD [facility] Pager [phone] From 6PM-2AM.  Otherwise please contact night-coverage [email or web address] Password [facility]  10/30/2017, 11:53 PM

## 2017-10-30 NOTE — ED Notes (Signed)
Informed Almyra Free RN of lactic acid 2.10

## 2017-10-31 ENCOUNTER — Other Ambulatory Visit: Payer: Self-pay

## 2017-10-31 ENCOUNTER — Encounter (HOSPITAL_COMMUNITY): Payer: Self-pay | Admitting: General Practice

## 2017-10-31 DIAGNOSIS — L03114 Cellulitis of left upper limb: Principal | ICD-10-CM

## 2017-10-31 LAB — CBC
HCT: 35.9 % — ABNORMAL LOW (ref 39.0–52.0)
Hemoglobin: 11.8 g/dL — ABNORMAL LOW (ref 13.0–17.0)
MCH: 30.3 pg (ref 26.0–34.0)
MCHC: 32.9 g/dL (ref 30.0–36.0)
MCV: 92.1 fL (ref 78.0–100.0)
Platelets: 193 10*3/uL (ref 150–400)
RBC: 3.9 MIL/uL — AB (ref 4.22–5.81)
RDW: 14.1 % (ref 11.5–15.5)
WBC: 8 10*3/uL (ref 4.0–10.5)

## 2017-10-31 LAB — LACTIC ACID, PLASMA: Lactic Acid, Venous: 1 mmol/L (ref 0.5–1.9)

## 2017-10-31 LAB — I-STAT CG4 LACTIC ACID, ED: LACTIC ACID, VENOUS: 2.21 mmol/L — AB (ref 0.5–1.9)

## 2017-10-31 MED ORDER — DILTIAZEM HCL ER COATED BEADS 180 MG PO CP24
360.0000 mg | ORAL_CAPSULE | Freq: Every day | ORAL | Status: DC
  Administered 2017-10-31 – 2017-11-02 (×3): 360 mg via ORAL
  Filled 2017-10-31 (×2): qty 2
  Filled 2017-10-31: qty 1
  Filled 2017-10-31: qty 2

## 2017-10-31 MED ORDER — LOSARTAN POTASSIUM 50 MG PO TABS
100.0000 mg | ORAL_TABLET | Freq: Every day | ORAL | Status: DC
  Administered 2017-10-31 – 2017-11-02 (×3): 100 mg via ORAL
  Filled 2017-10-31 (×3): qty 2

## 2017-10-31 MED ORDER — PANTOPRAZOLE SODIUM 40 MG PO TBEC
40.0000 mg | DELAYED_RELEASE_TABLET | Freq: Every day | ORAL | Status: DC
  Administered 2017-10-31 – 2017-11-02 (×3): 40 mg via ORAL
  Filled 2017-10-31 (×3): qty 1

## 2017-10-31 MED ORDER — SODIUM CHLORIDE 0.9 % IV BOLUS
1000.0000 mL | Freq: Once | INTRAVENOUS | Status: AC
  Administered 2017-10-31: 1000 mL via INTRAVENOUS

## 2017-10-31 MED ORDER — SERTRALINE HCL 50 MG PO TABS
50.0000 mg | ORAL_TABLET | Freq: Every day | ORAL | Status: DC
  Administered 2017-10-31 – 2017-11-02 (×3): 50 mg via ORAL
  Filled 2017-10-31 (×3): qty 1

## 2017-10-31 MED ORDER — ALBUTEROL SULFATE HFA 108 (90 BASE) MCG/ACT IN AERS: 2.0000 | INHALATION_SPRAY | Freq: Four times a day (QID) | RESPIRATORY_TRACT | Status: DC | PRN

## 2017-10-31 MED ORDER — ONDANSETRON HCL 4 MG/2ML IJ SOLN: 4.0000 mg | Freq: Four times a day (QID) | INTRAMUSCULAR | Status: DC | PRN

## 2017-10-31 MED ORDER — ALLOPURINOL 100 MG PO TABS
100.0000 mg | ORAL_TABLET | Freq: Every day | ORAL | Status: DC
  Administered 2017-10-31 – 2017-11-02 (×3): 100 mg via ORAL
  Filled 2017-10-31 (×3): qty 1

## 2017-10-31 MED ORDER — ALPRAZOLAM 0.5 MG PO TABS
0.5000 mg | ORAL_TABLET | Freq: Three times a day (TID) | ORAL | Status: DC
  Administered 2017-10-31 – 2017-11-02 (×6): 0.5 mg via ORAL
  Filled 2017-10-31 (×6): qty 1

## 2017-10-31 MED ORDER — ENOXAPARIN SODIUM 40 MG/0.4ML ~~LOC~~ SOLN
40.0000 mg | SUBCUTANEOUS | Status: DC
  Administered 2017-10-31 – 2017-11-01 (×2): 40 mg via SUBCUTANEOUS
  Filled 2017-10-31 (×2): qty 0.4

## 2017-10-31 MED ORDER — COLCHICINE 0.6 MG PO TABS: 0.6000 mg | ORAL_TABLET | Freq: Every day | ORAL | Status: DC | PRN

## 2017-10-31 MED ORDER — HYDROCODONE-ACETAMINOPHEN 5-325 MG PO TABS
1.0000 | ORAL_TABLET | ORAL | Status: DC | PRN
  Administered 2017-10-31 – 2017-11-02 (×9): 1 via ORAL
  Filled 2017-10-31 (×9): qty 1

## 2017-10-31 MED ORDER — ATORVASTATIN CALCIUM 20 MG PO TABS
20.0000 mg | ORAL_TABLET | Freq: Every day | ORAL | Status: DC
  Administered 2017-10-31 – 2017-11-02 (×3): 20 mg via ORAL
  Filled 2017-10-31 (×3): qty 1

## 2017-10-31 MED ORDER — ALBUTEROL SULFATE (2.5 MG/3ML) 0.083% IN NEBU: 2.5000 mg | INHALATION_SOLUTION | Freq: Four times a day (QID) | RESPIRATORY_TRACT | Status: DC | PRN

## 2017-10-31 MED ORDER — ONDANSETRON HCL 4 MG PO TABS: 4.0000 mg | ORAL_TABLET | Freq: Four times a day (QID) | ORAL | Status: DC | PRN

## 2017-10-31 NOTE — ED Notes (Signed)
Pt requested a pillow to elevate lt arm, pt given the same

## 2017-10-31 NOTE — ED Notes (Signed)
Pt requested ginger ale to drink, OK per [employer] pt given ginger ale

## 2017-10-31 NOTE — Progress Notes (Addendum)
Progress Note    Aaron Black  [id number] DOB: Aug 26, 1955  DOA: 10/30/2017 PCP: Marletta Lor, MD    Brief Narrative:   Chief complaint: Follow-up cat bite with infection  Medical records reviewed and are as summarized below:  Aaron Black is an 62 y.o. male with a PMH of gout, hypertension and diverticulosis who was admitted 10/30/17 for evaluation of redness and pain with streaking up the left arm status post Bite 3 days prior to presentation. Of note, the patient was admitted 06/23/18-06/27/18 after suffering from a cat scratch resulting in cellulitis. Cat was subsequently de-clawed by the veterinarian, however continues to be rambunctious and playful, and bit the patient resulting in the complaints noted above. He is up-to-date on his tetanus shot.  Assessment/Plan:   Principal Problem:   Cellulitis of left arm Secondary to cat bite. WBC 13 on admission (now 8.0), lactic acid 2.1. Started on IV cefazolin in the ED, switched to Unasyn on admission. Blood cultures pending. Discussed case with ID who recommends Unasyn with transition to by mouth Augmentin at discharge once cellulitis appears to be improving.  Active Problems:   HLD (hyperlipidemia) Continue Lipitor.    Gout Continue allopurinol and when necessary colchicine.    Anemia, unspecified    Essential hypertension Continue losartan and diltiazem.    GERD Continue Protonix.    Tobacco abuse Smokes about a half a pack per day.    Hyponatremia Mild, monitor.   Family Communication/Anticipated D/C date and plan/Code Status   DVT prophylaxis: Lovenox ordered. Code Status: Full Code.  Family Communication: Wife and mother-in-law updated at bedside. Disposition Plan: Possibly home 11/01/17 if cellulitis improved.   Medical Consultants:    None.   Anti-Infectives:    Cefazolin 1  Unasyn 10/30/17--->  Subjective:   No specific complaints. No nausea, vomiting or diarrhea. Mild pain at  bite site.  Objective:    Vitals:   10/31/17 0715 10/31/17 0800 10/31/17 0830 10/31/17 0915  BP: 131/84 129/80 (!) 137/99 132/81  Pulse: 70 65 77 86  Resp:      Temp:      TempSrc:      SpO2: 100% 96% 99% 100%  Weight:      Height:        Intake/Output Summary (Last 24 hours) at 10/31/2017 0948 Last data filed at 10/31/2017 0136 Gross per 24 hour  Intake 2149.99 ml  Output -  Net 2149.99 ml   Filed Weights   10/30/17 1844  Weight: 80.7 kg (178 lb)    Exam: General: No acute distress. Cardiovascular: Heart sounds show a regular rate, and rhythm. No gallops or rubs. No murmurs. No JVD. Lungs: Clear to auscultation bilaterally with good air movement. No rales, rhonchi or wheezes. Abdomen: Soft, nontender, nondistended with normal active bowel sounds. No masses. No hepatosplenomegaly. Neurological: Alert and oriented 3. Moves all extremities 4 with equal strength. Cranial nerves II through XII grossly intact. Skin: Warm and dry. Left forearm cellulitis is pictured below. Erythematous streaking up left inner upper arm with tenderness and axillary lymph nodes. Extremities: No clubbing or cyanosis. No edema. Pedal pulses 2+. Psychiatric: Mood and affect are normal. Insight and judgment are normal.       Data Reviewed:   I have personally reviewed following labs and imaging studies:  Labs: Labs show the following:   Basic Metabolic Panel: Recent Labs  Lab 10/30/17 1858  NA 132*  K 3.8  CL 100*  CO2 17*  GLUCOSE 90  BUN 13  CREATININE 1.19  CALCIUM 9.0   GFR Estimated Creatinine Clearance: 63.9 mL/min (by C-G formula based on SCr of 1.19 mg/dL).  CBC: Recent Labs  Lab 10/30/17 1858 10/31/17 0347  WBC 13.0* 8.0  NEUTROABS 9.5*  --   HGB 13.0 11.8*  HCT 38.7* 35.9*  MCV 92.1 92.1  PLT 239 193   Sepsis Labs: Recent Labs  Lab 10/30/17 1858 10/30/17 1903 10/31/17 0021 10/31/17 0347  WBC 13.0*  --   --  8.0  LATICACIDVEN  --  2.10* 2.21* 1.0     Microbiology No results found for this or any previous visit (from the past 240 hour(s)).  Procedures and diagnostic studies:  No results found.  Medications:   . allopurinol  100 mg Oral Daily  . atorvastatin  20 mg Oral Daily  . diltiazem  360 mg Oral Daily  . enoxaparin (LOVENOX) injection  40 mg Subcutaneous Q24H  . losartan  100 mg Oral Daily  . pantoprazole  40 mg Oral Daily  . sertraline  50 mg Oral Daily   Continuous Infusions: . ampicillin-sulbactam (UNASYN) IV Stopped (10/31/17 0748)   An additional 30 minutes spent at the patient's bedside from 10 OP-92:92 AM ascertaining the patient's history, discussing his tetanus status, discussed strategies to reduce risk of further animal bites.   LOS: 1 day   [facility] Pager [phone]. If unable to reach me by pager, please call my cell phone at [phone].  *Please refer to [email or web address], password [email or web address] to get updated schedule on who will round on this patient, as hospitalists switch teams weekly. If 7PM-7AM, please contact night-coverage at [email or web address], password [email or web address] for any overnight needs.  10/31/2017, 9:48 AM

## 2017-11-01 DIAGNOSIS — D649 Anemia, unspecified: Secondary | ICD-10-CM

## 2017-11-01 DIAGNOSIS — Z720 Tobacco use: Secondary | ICD-10-CM

## 2017-11-01 DIAGNOSIS — K521 Toxic gastroenteritis and colitis: Secondary | ICD-10-CM

## 2017-11-01 DIAGNOSIS — K219 Gastro-esophageal reflux disease without esophagitis: Secondary | ICD-10-CM

## 2017-11-01 DIAGNOSIS — Z716 Tobacco abuse counseling: Secondary | ICD-10-CM

## 2017-11-01 DIAGNOSIS — I10 Essential (primary) hypertension: Secondary | ICD-10-CM

## 2017-11-01 MED ORDER — SACCHAROMYCES BOULARDII 250 MG PO CAPS
250.0000 mg | ORAL_CAPSULE | Freq: Two times a day (BID) | ORAL | Status: DC
  Administered 2017-11-01 – 2017-11-02 (×3): 250 mg via ORAL
  Filled 2017-11-01 (×3): qty 1

## 2017-11-01 NOTE — Progress Notes (Addendum)
Progress Note    Aaron Black  YPP:[id number] DOB: Oct 02, 1955  DOA: 10/30/2017 PCP: Marletta Lor, MD    Brief Narrative:   Chief complaint: Follow-up cat bite with infection  Medical records reviewed and are as summarized below:  Aaron Black is an 62 y.o. male with a PMH of gout, hypertension and diverticulosis who was admitted 10/30/17 for evaluation of redness and pain with streaking up the left arm status post Bite 3 days prior to presentation. Of note, the patient was admitted 06/23/18-06/27/18 after suffering from a cat scratch resulting in cellulitis. Cat was subsequently de-clawed by the veterinarian, however continues to be rambunctious and playful, and bit the patient resulting in the complaints noted above. He is up-to-date on his tetanus shot.  Assessment/Plan:   Principal Problem:   Cellulitis of left arm Secondary to cat bite. WBC 13 on admission (now 8.0), lactic acid 2.1. Started on IV cefazolin in the ED, switched to Unasyn on admission. Blood cultures pending. Discussed case with ID who recommends Unasyn with transition to by mouth Augmentin at discharge once cellulitis appears to be improving.  Patient continues to have intense erythema with pain.  Discussed case with ID again and he recommends further imaging if no better in 24 hours.  Active Problems:   Diarrhea Likely from antibiotics. Add Florastor.  Patient does have a history of Clostridium difficile in the past.  Check WBC in the morning.  No abdominal tenderness at this point.    HLD (hyperlipidemia) Continue Lipitor.    Gout Continue allopurinol and when necessary colchicine.    Anemia, unspecified    Essential hypertension Continue losartan and diltiazem.    GERD Continue Protonix.    Tobacco abuse Smokes about a half a pack per day.  10-minute discussion held with him and his wife about tobacco cessation.  Patient reports that he has used Chantix in the past successfully and was able  to quit for 6 months at a time.  Discussed tobacco cessation aids including going back on Chantix versus Wellbutrin or using nicotine patches/gum.  Motivated to reattempt quitting and would like to try Chantix.  Will prescribe Chantix at discharge.    Hyponatremia Mild, monitor.   Family Communication/Anticipated D/C date and plan/Code Status   DVT prophylaxis: Lovenox ordered. Code Status: Full Code.  Family Communication: Wife and mother-in-law updated at bedside. Disposition Plan: Possibly home 11/01/17 if cellulitis improved.   Medical Consultants:    None.   Anti-Infectives:    Cefazolin 1  Unasyn 10/30/17--->  Subjective:   Has had 5-6 loose stools over the past 24 hours. No nausea or vomiting. Appetite OK. Left arm very tender.  Objective:    Vitals:   10/31/17 1100 10/31/17 1112 10/31/17 2135 11/01/17 0433  BP:  (!) 147/85 (!) 162/94 (!) 158/99  Pulse:  81 80 79  Resp:      Temp:  98.4 F (36.9 C) 98.4 F (36.9 C) 98.5 F (36.9 C)  TempSrc:  Oral  Oral  SpO2:  98% 97% 96%  Weight: 79.8 kg (175 lb 14.8 oz)     Height: 5\' 8"  (1.727 m)       Intake/Output Summary (Last 24 hours) at 11/01/2017 0847 Last data filed at 11/01/2017 0504 Gross per 24 hour  Intake 1320 ml  Output -  Net 1320 ml   Filed Weights   10/30/17 1844 10/31/17 1100  Weight: 80.7 kg (178 lb) 79.8 kg (175 lb 14.8 oz)  Exam: General: No acute distress. Cardiovascular: Heart sounds show a regular rate, and rhythm. No gallops or rubs. No murmurs. No JVD. Lungs: Clear to auscultation bilaterally with good air movement. No rales, rhonchi or wheezes. Abdomen: Soft, nontender, nondistended with normal active bowel sounds. No masses. No hepatosplenomegaly. Skin: Warm and dry.  Left forearm bite site intensely erythemic.  The streak that extended from his forearm up to his axilla is now gone.  Continues to be very tender in the entire left arm. Extremities: No clubbing or cyanosis. No  edema. Pedal pulses 2+.  Data Reviewed:   I have personally reviewed following labs and imaging studies:  Labs: Labs show the following:   Basic Metabolic Panel: Recent Labs  Lab 10/30/17 1858  NA 132*  K 3.8  CL 100*  CO2 17*  GLUCOSE 90  BUN 13  CREATININE 1.19  CALCIUM 9.0   GFR Estimated Creatinine Clearance: 63.9 mL/min (by C-G formula based on SCr of 1.19 mg/dL).  CBC: Recent Labs  Lab 10/30/17 1858 10/31/17 0347  WBC 13.0* 8.0  NEUTROABS 9.5*  --   HGB 13.0 11.8*  HCT 38.7* 35.9*  MCV 92.1 92.1  PLT 239 193   Sepsis Labs: Recent Labs  Lab 10/30/17 1858 10/30/17 1903 10/31/17 0021 10/31/17 0347  WBC 13.0*  --   --  8.0  LATICACIDVEN  --  2.10* 2.21* 1.0    Microbiology No results found for this or any previous visit (from the past 240 hour(s)).  Procedures and diagnostic studies:  No results found.  Medications:   . allopurinol  100 mg Oral Daily  . ALPRAZolam  0.5 mg Oral TID  . atorvastatin  20 mg Oral Daily  . diltiazem  360 mg Oral Daily  . enoxaparin (LOVENOX) injection  40 mg Subcutaneous Q24H  . losartan  100 mg Oral Daily  . pantoprazole  40 mg Oral Daily  . sertraline  50 mg Oral Daily   Continuous Infusions: . ampicillin-sulbactam (UNASYN) IV Stopped (11/01/17 0535)     LOS: 2 days   Jacquelynn Cree  [facility] Pager [phone]. If unable to reach me by pager, please call my cell phone at [phone].  *Please refer to [email or web address], password [email or web address] to get updated schedule on who will round on this patient, as hospitalists switch teams weekly. If 7PM-7AM, please contact night-coverage at [email or web address], password [email or web address] for any overnight needs.  11/01/2017, 8:47 AM

## 2017-11-02 DIAGNOSIS — E785 Hyperlipidemia, unspecified: Secondary | ICD-10-CM

## 2017-11-02 LAB — CBC
HCT: 38.7 % — ABNORMAL LOW (ref 39.0–52.0)
Hemoglobin: 12.8 g/dL — ABNORMAL LOW (ref 13.0–17.0)
MCH: 30.7 pg (ref 26.0–34.0)
MCHC: 33.1 g/dL (ref 30.0–36.0)
MCV: 92.8 fL (ref 78.0–100.0)
PLATELETS: 202 10*3/uL (ref 150–400)
RBC: 4.17 MIL/uL — AB (ref 4.22–5.81)
RDW: 13.8 % (ref 11.5–15.5)
WBC: 5.4 10*3/uL (ref 4.0–10.5)

## 2017-11-02 MED ORDER — VARENICLINE TARTRATE 1 MG PO TABS: 1.0000 mg | ORAL_TABLET | Freq: Two times a day (BID) | ORAL | 2 refills | Status: DC

## 2017-11-02 MED ORDER — SACCHAROMYCES BOULARDII 250 MG PO CAPS: 250.0000 mg | ORAL_CAPSULE | Freq: Two times a day (BID) | ORAL | 1 refills | Status: DC

## 2017-11-02 MED ORDER — AMOXICILLIN-POT CLAVULANATE 875-125 MG PO TABS: 1.0000 | ORAL_TABLET | Freq: Two times a day (BID) | ORAL | 0 refills | Status: AC

## 2017-11-02 NOTE — Discharge Instructions (Signed)

## 2017-11-02 NOTE — Discharge Summary (Signed)
Physician Discharge Summary  Aaron Black LZJ:[id number] DOB: [birth year] DOA: 10/30/2017  PCP: Marletta Lor, MD  Admit date: 10/30/2017 Discharge date: 11/02/2017  Admitted From: Home Discharge disposition: Home   Recommendations for Outpatient Follow-Up:   1. Follow-up with PCP in one week to ensure resolution of cellulitis.   Discharge Diagnosis:   Principal Problem:   Cellulitis of left arm Active Problems:   HLD (hyperlipidemia)   Gout   Anemia, unspecified   Essential hypertension   GERD   Tobacco abuse   Diarrhea due to drug   Encounter for tobacco use cessation counseling  Discharge Condition: Improved.  Diet recommendation: Low sodium, heart healthy.    Code status: Full.   History of Present Illness:   Aaron Black is an 62 y.o. male with a PMH of gout, hypertension and diverticulosis who was admitted 10/30/17 for evaluation of redness and pain with streaking up the left arm status post Bite 3 days prior to presentation. Of note, the patient was admitted 06/23/18-06/27/18 after suffering from a cat scratch resulting in cellulitis. Cat was subsequently de-clawed by the veterinarian, however continues to be rambunctious and playful, and bit the patient resulting in the complaints noted above. He is up-to-date on his tetanus shot.   Hospital Course by Problem:   Principal Problem:   Cellulitis of left arm Secondary to cat bite. WBC 13 on admission (now 8.0), lactic acid 2.1. Started on IV cefazolin in the ED, switched to Unasyn on admission. Blood cultures negative to date. Discharged home on an additional 5 days of treatment with Augmentin.  Active Problems:   Diarrhea Likely from antibiotics. Improved when placed on Florastor. No leukocytosis or abdominal tenderness to suggest Clostridium difficile infection.    HLD (hyperlipidemia) Continue Lipitor.    Gout Continue allopurinol and when necessary colchicine.    Anemia, unspecified   Essential hypertension Continue losartan and diltiazem.    GERD Continue Protonix.    Tobacco abuse Tobacco cessation counseling performed 11/01/17 and the patient was discharged with prescription for Chantix to assist with tobacco cessation.    Hyponatremia Mild, monitor.  Medical Consultants:    None.   Discharge Exam:   Vitals:   11/01/17 2128 11/02/17 0500  BP: (!) 162/85 (!) 151/92  Pulse: 66 61  Resp: 17 17  Temp: 97.9 F (36.6 C) 98.2 F (36.8 C)  SpO2: 96% 95%   Vitals:   11/01/17 0433 11/01/17 1455 11/01/17 2128 11/02/17 0500  BP: (!) 158/99 133/82 (!) 162/85 (!) 151/92  Pulse: 79 80 66 61  Resp:  15 17 17   Temp: 98.5 F (36.9 C) 98.5 F (36.9 C) 97.9 F (36.6 C) 98.2 F (36.8 C)  TempSrc: Oral Oral Oral Oral  SpO2: 96% 100% 96% 95%  Weight:      Height:        General exam: Appears calm and comfortable. Respiratory system: Clear to auscultation. Respiratory effort normal. Cardiovascular system: S1 & S2 heard, RRR. No JVD,  rubs, gallops or clicks. No murmurs. Gastrointestinal system: Abdomen is nondistended, soft and nontender. No organomegaly or masses felt. Normal bowel sounds heard. Central nervous system: Alert and oriented. No focal neurological deficits. Extremities: No clubbing,  or cyanosis. No edema. Skin: No rashes, lesions or ulcers. Psychiatry: Judgement and insight appear normal. Mood & affect appropriate.    The results of significant diagnostics from this hospitalization (including imaging, microbiology, ancillary and laboratory) are listed below for reference.     Procedures and  Diagnostic Studies:   No results found.   Labs:   Basic Metabolic Panel: Recent Labs  Lab 10/30/17 1858  NA 132*  K 3.8  CL 100*  CO2 17*  GLUCOSE 90  BUN 13  CREATININE 1.19  CALCIUM 9.0   GFR Estimated Creatinine Clearance: 63.9 mL/min (by C-G formula based on SCr of 1.19 mg/dL).  CBC: Recent Labs  Lab 10/30/17 1858  10/31/17 0347 11/02/17 0911  WBC 13.0* 8.0 5.4  NEUTROABS 9.5*  --   --   HGB 13.0 11.8* 12.8*  HCT 38.7* 35.9* 38.7*  MCV 92.1 92.1 92.8  PLT 239 193 202   Microbiology Recent Results (from the past 240 hour(s))  Culture, blood (routine x 2)     Status: None (Preliminary result)   Collection Time: 10/30/17 10:20 PM  Result Value Ref Range Status   Specimen Description BLOOD RIGHT HAND  Final   Special Requests   Final    BOTTLES DRAWN AEROBIC AND ANAEROBIC Blood Culture adequate volume   Culture   Final    NO GROWTH 2 DAYS Performed at [facility], [address] [place], [place] [address]    Report Status PENDING  Incomplete  Culture, blood (routine x 2)     Status: None (Preliminary result)   Collection Time: 10/30/17 10:25 PM  Result Value Ref Range Status   Specimen Description BLOOD RIGHT ANTECUBITAL  Final   Special Requests   Final    BOTTLES DRAWN AEROBIC AND ANAEROBIC Blood Culture adequate volume   Culture   Final    NO GROWTH 2 DAYS Performed at [facility], [place] [address], [place], [place] [address]    Report Status PENDING  Incomplete     Discharge Instructions:   Discharge Instructions    Call MD for:  severe uncontrolled pain   Complete by:  As directed    Call MD for:  temperature >100.4   Complete by:  As directed    Diet general   Complete by:  As directed    Increase activity slowly   Complete by:  As directed      Allergies as of 11/02/2017      Reactions   Lisinopril Swelling   Swollen Lip   Percodan [oxycodone-aspirin] Rash   Percodan caused rash---but he can tolerate Percocet and aspirin on their own       Medication List    STOP taking these medications   cyclobenzaprine 10 MG tablet Commonly known as:  FLEXERIL   ketoconazole 2 % cream Commonly known as:  NIZORAL     TAKE these medications   albuterol 108 (90 Base) MCG/ACT inhaler Commonly known as:  PROVENTIL HFA;VENTOLIN HFA Inhale 2 puffs into the lungs  every 6 (six) hours as needed for wheezing or shortness of breath.   allopurinol 100 MG tablet Commonly known as:  ZYLOPRIM TAKE 1 TABLET DAILY   ALPRAZolam 0.5 MG tablet Commonly known as:  XANAX Take 1 tablet (0.5 mg total) by mouth 3 (three) times daily.   amoxicillin-clavulanate 875-125 MG tablet Commonly known as:  AUGMENTIN Take 1 tablet by mouth 2 (two) times daily for 5 days.   atorvastatin 20 MG tablet Commonly known as:  LIPITOR TAKE 1 TABLET (20 MG TOTAL) BY MOUTH DAILY.   colchicine 0.6 MG tablet Take 1 tablet (0.6 mg total) by mouth daily as needed (GOUT).   diclofenac sodium 1 % Gel Commonly known as:  VOLTAREN Apply 2 g topically  as needed (for pain).   diltiazem 360 MG 24 hr capsule Commonly known as:  CARDIZEM CD Take 1 capsule (360 mg total) by mouth daily.   HYDROcodone-acetaminophen 5-325 MG tablet Commonly known as:  NORCO/VICODIN Take 1 tablet by mouth every 8 (eight) hours as needed for moderate pain.   losartan 100 MG tablet Commonly known as:  COZAAR Take 1 tablet (100 mg total) by mouth daily.   omeprazole 20 MG tablet Commonly known as:  PRILOSEC OTC Take 2 tablets (40 mg total) by mouth daily.   saccharomyces boulardii 250 MG capsule Commonly known as:  FLORASTOR Take 1 capsule (250 mg total) by mouth 2 (two) times daily.   sertraline 50 MG tablet Commonly known as:  ZOLOFT Take 1 tablet (50 mg total) by mouth daily.   varenicline 1 MG tablet Commonly known as:  CHANTIX CONTINUING MONTH PAK Take 1 tablet (1 mg total) by mouth 2 (two) times daily.   vitamin B-12 1000 MCG tablet Commonly known as:  CYANOCOBALAMIN Take 1,000 mcg by mouth daily.      Follow-up Information    Marletta Lor, MD. Schedule an appointment as soon as possible for a visit in 1 week(s).   Specialty:  Internal Medicine Why:  Hospital follow up. Contact information: [place]  [id number] [phone]            Time  coordinating discharge: 35 minutes including discharge instructions regarding antibiotic indication and duration of therapy, reinforcement of tobacco cessation counseling and treatment Chantix, and safe ways to handle pets.  SignedMargreta Journey Dusty Raczkowski  Pager [phone] [facility] 11/02/2017, 1:51 PM

## 2017-11-02 NOTE — Progress Notes (Signed)
Patient discharged to home with instructions. 

## 2017-11-05 ENCOUNTER — Telehealth: Payer: Self-pay | Admitting: Family Medicine

## 2017-11-05 LAB — CULTURE, BLOOD (ROUTINE X 2)
CULTURE: NO GROWTH
CULTURE: NO GROWTH
Special Requests: ADEQUATE
Special Requests: ADEQUATE

## 2017-11-05 NOTE — Telephone Encounter (Signed)
Transition Care Management Follow-up Telephone Call Aaron Black MBW:[id number] DOB: [birth year] DOA: 10/30/2017  PCP: Marletta Lor, MD  Admit date: 10/30/2017 Discharge date: 11/02/2017  Admitted From: Home Discharge disposition: Home   Recommendations for Outpatient Follow-Up:   1. Follow-up with PCP in one week to ensure resolution of cellulitis.   Discharge Diagnosis:   Principal Problem:   Cellulitis of left arm  Aaron Black an 62 y.o.malewith a PMH of gout, hypertension and diverticulosis who was admitted 10/30/17 for evaluation of redness and pain with streaking up the left arm status post Bite 3 days prior to presentation. Of note, the patient was admitted 06/23/18-06/27/18 after suffering from a cat scratch resulting in cellulitis. Cat was subsequently de-clawed by the veterinarian, however continues to be rambunctious and playful, and bit the patient resulting in the complaints noted above. He is up-to-date on his tetanus shot.    How have you been since you were released from the hospital? "better"   Do you understand why you were in the hospital? yes   Do you understand the discharge instructions? yes   Where were you discharged to? Home   Items Reviewed:  Medications reviewed: yes  Allergies reviewed: yes  Dietary changes reviewed: yes  Referrals reviewed: yes   Functional Questionnaire:   Activities of Daily Living (ADLs):   He states they are independent in the following: ambulation, bathing and hygiene, feeding, continence, grooming, toileting and dressing States they require assistance with the following: none   Any transportation issues/concerns?: no   Any patient concerns? no   Confirmed importance and date/time of follow-up visits scheduled yes  Provider Appointment booked with Dr. Regis Bill on 11/12/2017 Monday at 2:30 pm, PCP has no openings  Confirmed with patient if condition begins to worsen call PCP or go to the  ER.  Patient was given the office number and encouraged to call back with question or concerns.  : yes

## 2017-11-05 NOTE — Telephone Encounter (Signed)
I left a voice message for pt to return my call.  

## 2017-11-11 NOTE — Progress Notes (Signed)
Chief Complaint  Patient presents with  . Hospitalization Follow-up    Pt doing well since d/c home. Pt completed the antibiotics for the recent cat bite/cellulitis. Pt still taking Florastor. Pt c/o burning in mouth - jaws and tongue.      HPI: Aaron Black 62 y.o. come in for "hosp FU"  PCP is Dr Raliegh Ip.   Was hosp  4 9 - 4 12 for cellulitis  LUE  After    A  Cat bite  Dc 4 12   DC summary states to fu 1 week pcp to ensure resolution cellulitis   He finished antibiotic  April 17/ and since then no recurrance of sx  And looser stools are getting better    Given Unasyn and op Augmentin.   Probiotic  Left arm  Better   Still  moputh some what sore getting better no patches   . Tobacco user  Aware ned to stop   Hx of c diff. Remote past.    Principal Problem:   Cellulitis of left arm Active Problems:   HLD (hyperlipidemia)   Gout   Anemia, unspecified   Essential hypertension   GERD   Tobacco abuse   Diarrhea due to drug   Encounter for tobacco use cessation counseling  Discharge Condition: Improved.  Diet recommendation: Low sodium, heart healthy.    ROS: See pertinent positives and negatives per HPI. No cp sob  Fever   Past Medical History:  Diagnosis Date  . Anemia   . Anxiety   . Arthritis   . B12 deficiency   . Cellulitis of arm, left 10/2017   after cat scratch  . Cervical disc disease    a. 07/2003 ant cervical deomcpression and fusion C5-6/C6-7;  b. 09/2007 post cervical laminectomy C4-5 with scres and arthrodesis C4-C7.  Marland Kitchen Chest pain at rest 02/25/2013  . Chronic lower back pain   . Complication of anesthesia    " i WOKE UP DURING A COLONOSCOPY "- 2008  . Depression   . Diverticulosis of colon   . GERD (gastroesophageal reflux disease)   . Gout   . Hyperlipidemia   . Hypertension   . Pleurisy   . Shortness of breath dyspnea    with exertion- "I cant exercise now"  . Tobacco abuse    a. 40 yrs, 1.5-3 ppd over that time.    Family History    Problem Relation Age of Onset  . Bone cancer Mother   . Squamous cell carcinoma Mother        died @ 34  . Heart attack Mother   . Hypertension Father   . Diabetes Father        borderline  . Congestive Heart Failure Father        alive @ 67  . Heart failure Father   . Sudden death Brother        died @ 61  . Other Brother        died in [place] @ 36 - struck by drunk driver  . Other Brother        died in [place] @ 30 - struck by drunk driver  . Heart attack Brother   . Stroke Paternal Grandfather   . Colon cancer Neg Hx   . Esophageal cancer Neg Hx   . Rectal cancer Neg Hx   . Stomach cancer Neg Hx     Social History   Socioeconomic History  . Marital status: Married  Spouse name: Not on file  . Number of children: Not on file  . Years of education: Not on file  . Highest education level: Not on file  Occupational History  . Not on file  Social Needs  . Financial resource strain: Not on file  . Food insecurity:    Worry: Not on file    Inability: Not on file  . Transportation needs:    Medical: Not on file    Non-medical: Not on file  Tobacco Use  . Smoking status: Current Some Day Smoker    Packs/day: 0.50    Years: 40.00    Pack years: 20.00    Types: Cigarettes  . Smokeless tobacco: Never Used  . Tobacco comment: Currently smoking 1.5 ppd.  Has previously smoked up to 3 ppd and did so for about 20 yrs.  Substance and Sexual Activity  . Alcohol use: Yes    Comment: occasional  . Drug use: No  . Sexual activity: Not on file  Lifestyle  . Physical activity:    Days per week: Not on file    Minutes per session: Not on file  . Stress: Not on file  Relationships  . Social connections:    Talks on phone: Not on file    Gets together: Not on file    Attends religious service: Not on file    Active member of club or organization: Not on file    Attends meetings of clubs or organizations: Not on file    Relationship status: Not on file  Other Topics Concern   . Not on file  Social History Narrative   Lives in [place] with wife.  Does not work.  Previously read H2O meters for city of [place].    Outpatient Medications Prior to Visit  Medication Sig Dispense Refill  . albuterol (PROVENTIL HFA;VENTOLIN HFA) 108 (90 Base) MCG/ACT inhaler Inhale 2 puffs into the lungs every 6 (six) hours as needed for wheezing or shortness of breath. 1 Inhaler 3  . allopurinol (ZYLOPRIM) 100 MG tablet TAKE 1 TABLET DAILY 90 tablet 0  . ALPRAZolam (XANAX) 0.5 MG tablet Take 1 tablet (0.5 mg total) by mouth 3 (three) times daily. 90 tablet 0  . atorvastatin (LIPITOR) 20 MG tablet TAKE 1 TABLET (20 MG TOTAL) BY MOUTH DAILY. 90 tablet 1  . colchicine 0.6 MG tablet Take 1 tablet (0.6 mg total) by mouth daily as needed (GOUT). 30 tablet 0  . diclofenac sodium (VOLTAREN) 1 % GEL Apply 2 g topically as needed (for pain).   0  . diltiazem (CARDIZEM CD) 360 MG 24 hr capsule Take 1 capsule (360 mg total) by mouth daily. 90 capsule 3  . HYDROcodone-acetaminophen (NORCO/VICODIN) 5-325 MG tablet Take 1 tablet by mouth every 8 (eight) hours as needed for moderate pain.   0  . losartan (COZAAR) 100 MG tablet Take 1 tablet (100 mg total) by mouth daily. 90 tablet 3  . omeprazole (PRILOSEC OTC) 20 MG tablet Take 2 tablets (40 mg total) by mouth daily.    Marland Kitchen saccharomyces boulardii (FLORASTOR) 250 MG capsule Take 1 capsule (250 mg total) by mouth 2 (two) times daily. 60 capsule 1  . sertraline (ZOLOFT) 50 MG tablet Take 1 tablet (50 mg total) by mouth daily. 90 tablet 3  . varenicline (CHANTIX CONTINUING MONTH PAK) 1 MG tablet Take 1 tablet (1 mg total) by mouth 2 (two) times daily. 60 tablet 2  . vitamin B-12 (CYANOCOBALAMIN) 1000 MCG tablet Take  1,000 mcg by mouth daily.     No facility-administered medications prior to visit.      EXAM:  BP (!) 154/98 (BP Location: Right Arm, Patient Position: Sitting, Cuff Size: Normal) Comment: pt states he has high BP at times  Pulse 78   Temp 97.9  F (36.6 C) (Oral)   Wt 175 lb 6.4 oz (79.6 kg)   BMI 26.67 kg/m   Body mass index is 26.67 kg/m.  GENERAL: vitals reviewed and listed above, alert, oriented, appears well hydrated and in no acute distress HEENT: atraumatic, conjunctiva  clear, no obvious abnormalities on inspection of external nose and ears OP : no lesion edema or exudate   Mild coated on sides no patches es  NECK: no obvious masses on inspection palpation   CV: HRRR, no clubbing cyanosis or  peripheral edema nl cap refill  MS: moves all extremities without noticeable focal  Abnormality  bilateral forearm pigmentation left more than right  somce scaling ? sun changes  No streaking  No lesions   No adenopathy axillary left  PSYCH: pleasant and cooperative, no obvious depression or anxiety  BP Readings from Last 3 Encounters:  11/12/17 (!) 154/98  11/02/17 (!) 151/92  07/10/17 (!) 162/80    ASSESSMENT AND PLAN:  Discussed the following assessment and plan:  Cellulitis of arm, left - resolved   Cat bite, sequela  Sore mouth  Tobacco abuse  Hospital discharge follow-up Cellulitis resolved  At risk  c diff but  Getting better  Sore mouth poss  Resolving thrush  Pt states getting better  And no lesion so will wait  And observe  Contact us if  persistent or progressive  Injury prevention  tdap utd  Hypertension   Make sure at goal states it ususallu is  Fu with PCP -Patient advised to return or notify health care team  if  new concerns arise.  Patient Instructions  Glad you  Are doing better  . If   The mouth sx     Continue .      Call for possible  Thrush treatment but I think may resolve on its own .   Avoid future cat injuries .  Will send info  To Dr Standley Brooking K. Panosh M.D.

## 2017-11-12 ENCOUNTER — Ambulatory Visit (INDEPENDENT_AMBULATORY_CARE_PROVIDER_SITE_OTHER): Payer: 59 | Admitting: Internal Medicine

## 2017-11-12 ENCOUNTER — Encounter: Payer: Self-pay | Admitting: Internal Medicine

## 2017-11-12 VITALS — BP 154/98 | HR 78 | Temp 97.9°F | Wt 175.4 lb

## 2017-11-12 DIAGNOSIS — Z09 Encounter for follow-up examination after completed treatment for conditions other than malignant neoplasm: Secondary | ICD-10-CM | POA: Diagnosis not present

## 2017-11-12 DIAGNOSIS — Z872 Personal history of diseases of the skin and subcutaneous tissue: Secondary | ICD-10-CM

## 2017-11-12 DIAGNOSIS — L03114 Cellulitis of left upper limb: Secondary | ICD-10-CM

## 2017-11-12 DIAGNOSIS — K1379 Other lesions of oral mucosa: Secondary | ICD-10-CM | POA: Diagnosis not present

## 2017-11-12 DIAGNOSIS — Z720 Tobacco use: Secondary | ICD-10-CM | POA: Diagnosis not present

## 2017-11-12 DIAGNOSIS — W5501XD Bitten by cat, subsequent encounter: Secondary | ICD-10-CM

## 2017-11-12 DIAGNOSIS — W5501XS Bitten by cat, sequela: Secondary | ICD-10-CM

## 2017-11-12 NOTE — Patient Instructions (Addendum)
Glad you  Are doing better  . If   The mouth sx     Continue .      Call for possible  Thrush treatment but I think may resolve on its own .   Avoid future cat injuries .  Will send info  To Dr Raliegh Ip

## 2017-12-03 ENCOUNTER — Other Ambulatory Visit: Payer: Self-pay | Admitting: Internal Medicine

## 2018-01-10 ENCOUNTER — Other Ambulatory Visit: Payer: Self-pay | Admitting: Internal Medicine

## 2018-02-11 ENCOUNTER — Other Ambulatory Visit: Payer: Self-pay | Admitting: Internal Medicine

## 2018-02-11 NOTE — Telephone Encounter (Signed)
Last fill 12/05/17 Last OV 07/10/17 Ok to fill?

## 2018-02-18 DIAGNOSIS — M150 Primary generalized (osteo)arthritis: Secondary | ICD-10-CM | POA: Diagnosis not present

## 2018-02-18 DIAGNOSIS — M5030 Other cervical disc degeneration, unspecified cervical region: Secondary | ICD-10-CM | POA: Diagnosis not present

## 2018-02-18 DIAGNOSIS — Z6825 Body mass index (BMI) 25.0-25.9, adult: Secondary | ICD-10-CM | POA: Diagnosis not present

## 2018-02-18 DIAGNOSIS — M1A09X0 Idiopathic chronic gout, multiple sites, without tophus (tophi): Secondary | ICD-10-CM | POA: Diagnosis not present

## 2018-02-18 DIAGNOSIS — E663 Overweight: Secondary | ICD-10-CM | POA: Diagnosis not present

## 2018-02-18 DIAGNOSIS — N183 Chronic kidney disease, stage 3 (moderate): Secondary | ICD-10-CM | POA: Diagnosis not present

## 2018-03-13 ENCOUNTER — Other Ambulatory Visit: Payer: Self-pay | Admitting: Internal Medicine

## 2018-03-18 ENCOUNTER — Ambulatory Visit (INDEPENDENT_AMBULATORY_CARE_PROVIDER_SITE_OTHER): Payer: 59 | Admitting: Internal Medicine

## 2018-03-18 ENCOUNTER — Encounter: Payer: Self-pay | Admitting: Internal Medicine

## 2018-03-18 VITALS — BP 140/82 | HR 95 | Temp 98.4°F | Wt 174.4 lb

## 2018-03-18 DIAGNOSIS — I10 Essential (primary) hypertension: Secondary | ICD-10-CM

## 2018-03-18 DIAGNOSIS — F419 Anxiety disorder, unspecified: Secondary | ICD-10-CM

## 2018-03-18 DIAGNOSIS — R69 Illness, unspecified: Secondary | ICD-10-CM | POA: Diagnosis not present

## 2018-03-18 DIAGNOSIS — Z720 Tobacco use: Secondary | ICD-10-CM

## 2018-03-18 DIAGNOSIS — Z86010 Personal history of colonic polyps: Secondary | ICD-10-CM | POA: Diagnosis not present

## 2018-03-18 MED ORDER — SERTRALINE HCL 50 MG PO TABS: 50.0000 mg | ORAL_TABLET | Freq: Every day | ORAL | 3 refills | Status: DC

## 2018-03-18 MED ORDER — DILTIAZEM HCL ER COATED BEADS 360 MG PO CP24: 360.0000 mg | ORAL_CAPSULE | Freq: Every day | ORAL | 3 refills | Status: DC

## 2018-03-18 MED ORDER — ALPRAZOLAM 0.5 MG PO TABS: 0.5000 mg | ORAL_TABLET | Freq: Three times a day (TID) | ORAL | 0 refills | Status: DC | PRN

## 2018-03-18 MED ORDER — ATORVASTATIN CALCIUM 20 MG PO TABS: 20.0000 mg | ORAL_TABLET | Freq: Every day | ORAL | 1 refills | Status: DC

## 2018-03-18 MED ORDER — COLCHICINE 0.6 MG PO TABS: 0.6000 mg | ORAL_TABLET | Freq: Every day | ORAL | 0 refills | Status: DC | PRN

## 2018-03-18 MED ORDER — LOSARTAN POTASSIUM 100 MG PO TABS: 100.0000 mg | ORAL_TABLET | Freq: Every day | ORAL | 3 refills | Status: DC

## 2018-03-18 NOTE — Progress Notes (Signed)
Subjective:    Patient ID: Aaron Black, male    DOB: 1956-06-04, 62 y.o.   MRN: [id number]  HPI  62 year old patient who is seen today for follow-up.  He is followed by rheumatology. He has a history of essential hypertension Doing reasonably well today.  He does use alprazolam 2 or 3 times daily for anxiety disorder  History of ongoing tobacco use.  He did have a negative screening chest CT earlier this year  Past Medical History:  Diagnosis Date  . Anemia   . Anxiety   . Arthritis   . B12 deficiency   . Cellulitis of arm, left 10/2017   after cat scratch  . Cervical disc disease    a. 07/2003 ant cervical deomcpression and fusion C5-6/C6-7;  b. 09/2007 post cervical laminectomy C4-5 with scres and arthrodesis C4-C7.  Marland Kitchen Chest pain at rest 02/25/2013  . Chronic lower back pain   . Complication of anesthesia    " i WOKE UP DURING A COLONOSCOPY "- 2008  . Depression   . Diverticulosis of colon   . GERD (gastroesophageal reflux disease)   . Gout   . Hyperlipidemia   . Hypertension   . Pleurisy   . Shortness of breath dyspnea    with exertion- "I cant exercise now"  . Tobacco abuse    a. 40 yrs, 1.5-3 ppd over that time.     Social History   Socioeconomic History  . Marital status: Married    Spouse name: Not on file  . Number of children: Not on file  . Years of education: Not on file  . Highest education level: Not on file  Occupational History  . Not on file  Social Needs  . Financial resource strain: Not on file  . Food insecurity:    Worry: Not on file    Inability: Not on file  . Transportation needs:    Medical: Not on file    Non-medical: Not on file  Tobacco Use  . Smoking status: Current Some Day Smoker    Packs/day: 0.50    Years: 40.00    Pack years: 20.00    Types: Cigarettes  . Smokeless tobacco: Never Used  . Tobacco comment: Currently smoking 1.5 ppd.  Has previously smoked up to 3 ppd and did so for about 20 yrs.  Substance and Sexual  Activity  . Alcohol use: Yes    Comment: occasional  . Drug use: No  . Sexual activity: Not on file  Lifestyle  . Physical activity:    Days per week: Not on file    Minutes per session: Not on file  . Stress: Not on file  Relationships  . Social connections:    Talks on phone: Not on file    Gets together: Not on file    Attends religious service: Not on file    Active member of club or organization: Not on file    Attends meetings of clubs or organizations: Not on file    Relationship status: Not on file  . Intimate partner violence:    Fear of current or ex partner: Not on file    Emotionally abused: Not on file    Physically abused: Not on file    Forced sexual activity: Not on file  Other Topics Concern  . Not on file  Social History Narrative   Lives in [place] with wife.  Does not work.  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  Past Surgical History:  Procedure Laterality Date  . ANTERIOR LATERAL LUMBAR FUSION 4 LEVELS Right 06/05/2014   Procedure: ANTERIOR LATERAL LUMBAR FUSION  LUMBAR ONE TO LUMBAR FIVE with Percutaneous Pedicle Screws;  Surgeon: Erline Levine, MD;  Location: [facility];  Service: Neurosurgery;  Laterality: Right;  . CARPAL TUNNEL RELEASE Right   . CERVICAL DISC SURGERY     X 2  . COLONOSCOPY    . ELBOW ARTHROSCOPY Right   . KNEE ARTHROSCOPY Right 08/2013   torn menicus  . KNEE ARTHROSCOPY Left 11/2013   chip cartlidge,torn menicus  . LUMBAR LAMINECTOMY/DECOMPRESSION MICRODISCECTOMY Right 07/11/2013   Procedure: LUMBAR LAMINECTOMY/DECOMPRESSION MICRODISCECTOMY 1 LEVEL;  Surgeon: Erline Levine, MD;  Location: [facility];  Service: Neurosurgery;  Laterality: Right;  Right L34 microdiskectomy  . LUMBAR PERCUTANEOUS PEDICLE SCREW 4 LEVEL N/A 06/05/2014   Procedure: LUMBAR PERCUTANEOUS PEDICLE SCREW 4 LEVEL;  Surgeon: Erline Levine, MD;  Location: [facility];  Service: Neurosurgery;  Laterality: N/A;  . NECK SURGERY  January 2005, March 2009    C-Spine  . ROTATOR CUFF REPAIR  August 2000, January 2002, July 2008, July 2009   2 left 2 right    Family History  Problem Relation Age of Onset  . Bone cancer Mother   . Squamous cell carcinoma Mother        died @ 40  . Heart attack Mother   . Hypertension Father   . Diabetes Father        borderline  . Congestive Heart Failure Father        alive @ 30  . Heart failure Father   . Sudden death Brother        died @ 28  . Other Brother        died in [place] @ 63 - struck by drunk driver  . Other Brother        died in [place] @ 53 - struck by drunk driver  . Heart attack Brother   . Stroke Paternal Grandfather   . Colon cancer Neg Hx   . Esophageal cancer Neg Hx   . Rectal cancer Neg Hx   . Stomach cancer Neg Hx     Allergies  Allergen Reactions  . Lisinopril Swelling    Swollen Lip  . Percodan [Oxycodone-Aspirin] Rash    Percodan caused rash---but he can tolerate Percocet and aspirin on their own     Current Outpatient Medications on File Prior to Visit  Medication Sig Dispense Refill  . albuterol (PROVENTIL HFA;VENTOLIN HFA) 108 (90 Base) MCG/ACT inhaler Inhale 2 puffs into the lungs every 6 (six) hours as needed for wheezing or shortness of breath. 1 Inhaler 3  . allopurinol (ZYLOPRIM) 100 MG tablet TAKE 1 TABLET DAILY 90 tablet 0  . ALPRAZolam (XANAX) 0.5 MG tablet TAKE 1 TABALET BY MOUTH 3 TIMES A DAY 90 tablet 0  . atorvastatin (LIPITOR) 20 MG tablet TAKE 1 TABLET (20 MG TOTAL) BY MOUTH DAILY. 90 tablet 1  . colchicine 0.6 MG tablet Take 1 tablet (0.6 mg total) by mouth daily as needed (GOUT). 30 tablet 0  . diclofenac sodium (VOLTAREN) 1 % GEL Apply 2 g topically as needed (for pain).   0  . diltiazem (CARDIZEM CD) 360 MG 24 hr capsule Take 1 capsule (360 mg total) by mouth daily. 90 capsule 3  . HYDROcodone-acetaminophen (NORCO/VICODIN) 5-325 MG tablet Take 1 tablet by mouth every 8 (eight) hours as needed for moderate pain.  0  . losartan (COZAAR) 100 MG tablet  Take 1 tablet (100 mg total) by mouth daily. 90 tablet 3  . omeprazole (PRILOSEC OTC) 20 MG tablet Take 2 tablets (40 mg total) by mouth daily.    . sertraline (ZOLOFT) 50 MG tablet Take 1 tablet (50 mg total) by mouth daily. 90 tablet 3  . vitamin B-12 (CYANOCOBALAMIN) 1000 MCG tablet Take 1,000 mcg by mouth daily.     No current facility-administered medications on file prior to visit.     BP 140/82 (BP Location: Right Arm, Patient Position: Sitting, Cuff Size: Large)   Pulse 95   Temp 98.4 F (36.9 C) (Oral)   Wt 174 lb 6.4 oz (79.1 kg)   SpO2 96%   BMI 26.52 kg/m     Review of Systems  Constitutional: Negative for appetite change, chills, fatigue and fever.  HENT: Negative for congestion, dental problem, ear pain, hearing loss, sore throat, tinnitus, trouble swallowing and voice change.   Eyes: Negative for pain, discharge and visual disturbance.  Respiratory: Negative for cough, chest tightness, wheezing and stridor.   Cardiovascular: Negative for chest pain, palpitations and leg swelling.  Gastrointestinal: Negative for abdominal distention, abdominal pain, blood in stool, constipation, diarrhea, nausea and vomiting.  Genitourinary: Negative for difficulty urinating, discharge, flank pain, genital sores, hematuria and urgency.  Musculoskeletal: Positive for arthralgias, back pain and neck pain. Negative for gait problem, joint swelling, myalgias and neck stiffness.  Skin: Negative for rash.  Neurological: Negative for dizziness, syncope, speech difficulty, weakness, numbness and headaches.  Hematological: Negative for adenopathy. Does not bruise/bleed easily.  Psychiatric/Behavioral: Negative for behavioral problems and dysphoric mood. The patient is nervous/anxious.        Objective:   Physical Exam  Constitutional: He is oriented to person, place, and time. He appears well-developed.  Blood pressure well controlled  HENT:  Head: Normocephalic.  Right Ear: External ear  normal.  Left Ear: External ear normal.  Eyes: Conjunctivae and EOM are normal.  Neck: Normal range of motion.  Cardiovascular: Normal rate and normal heart sounds.  Pulmonary/Chest: Breath sounds normal.  Abdominal: Bowel sounds are normal.  Musculoskeletal: Normal range of motion. He exhibits no edema or tenderness.  Neurological: He is alert and oriented to person, place, and time.  Psychiatric: He has a normal mood and affect. His behavior is normal.          Assessment & Plan:   Essential hypertension.  Well-controlled Tobacco abuse.  Total smoking cessation encouraged History of B12 deficiency Dyslipidemia continue statin therapy Anxiety disorder.  Alprazolam refilled  Rheumatology follow-up as scheduled Total smoking cessation encouraged Patient will follow-up with new PCP in the next 3 to 6 months All medications refilled  Marletta Lor

## 2018-03-18 NOTE — Patient Instructions (Signed)
Limit your sodium (Salt) intake  Please check your blood pressure on a regular basis.  If it is consistently greater than 150/90, please make an office appointment.    It is important that you exercise regularly, at least 20 minutes 3 to 4 times per week.  If you develop chest pain or shortness of breath seek  medical attention.  Smoking tobacco is very bad for your health. You should stop smoking immediately.  Return in 3 months for follow-up

## 2018-03-19 ENCOUNTER — Other Ambulatory Visit: Payer: Self-pay | Admitting: Internal Medicine

## 2018-04-10 ENCOUNTER — Other Ambulatory Visit: Payer: Self-pay | Admitting: Internal Medicine

## 2018-05-11 ENCOUNTER — Other Ambulatory Visit: Payer: Self-pay | Admitting: Internal Medicine

## 2018-05-13 NOTE — Telephone Encounter (Signed)
ok 

## 2018-05-13 NOTE — Telephone Encounter (Signed)
Pt aware to follow up with new PCP for further refills due to Dr.Kwiakowski's retiring.  Pt is est. Care with [facility]. Pt stated him and his wife will call back to schedule the appt. Once they have their dates in order.  Okay for refill?

## 2018-05-14 ENCOUNTER — Other Ambulatory Visit: Payer: Self-pay | Admitting: Internal Medicine

## 2018-05-21 ENCOUNTER — Ambulatory Visit (INDEPENDENT_AMBULATORY_CARE_PROVIDER_SITE_OTHER): Payer: 59 | Admitting: *Deleted

## 2018-05-21 DIAGNOSIS — E663 Overweight: Secondary | ICD-10-CM | POA: Diagnosis not present

## 2018-05-21 DIAGNOSIS — Z23 Encounter for immunization: Secondary | ICD-10-CM | POA: Diagnosis not present

## 2018-05-21 DIAGNOSIS — M1A09X0 Idiopathic chronic gout, multiple sites, without tophus (tophi): Secondary | ICD-10-CM | POA: Diagnosis not present

## 2018-05-21 DIAGNOSIS — M5030 Other cervical disc degeneration, unspecified cervical region: Secondary | ICD-10-CM | POA: Diagnosis not present

## 2018-05-21 DIAGNOSIS — N183 Chronic kidney disease, stage 3 (moderate): Secondary | ICD-10-CM | POA: Diagnosis not present

## 2018-05-21 DIAGNOSIS — Z6825 Body mass index (BMI) 25.0-25.9, adult: Secondary | ICD-10-CM | POA: Diagnosis not present

## 2018-05-21 DIAGNOSIS — M150 Primary generalized (osteo)arthritis: Secondary | ICD-10-CM | POA: Diagnosis not present

## 2018-06-12 ENCOUNTER — Encounter: Payer: Self-pay | Admitting: Family Medicine

## 2018-06-12 ENCOUNTER — Ambulatory Visit (INDEPENDENT_AMBULATORY_CARE_PROVIDER_SITE_OTHER): Payer: Medicare HMO | Admitting: Family Medicine

## 2018-06-12 ENCOUNTER — Other Ambulatory Visit: Payer: Self-pay

## 2018-06-12 VITALS — BP 138/92 | HR 90 | Temp 98.3°F | Ht 67.0 in | Wt 173.2 lb

## 2018-06-12 DIAGNOSIS — R1032 Left lower quadrant pain: Secondary | ICD-10-CM

## 2018-06-12 LAB — POCT URINALYSIS DIPSTICK
Bilirubin, UA: NEGATIVE
GLUCOSE UA: NEGATIVE
Ketones, UA: NEGATIVE
LEUKOCYTES UA: NEGATIVE
NITRITE UA: NEGATIVE
PH UA: 5.5 (ref 5.0–8.0)
PROTEIN UA: NEGATIVE
RBC UA: NEGATIVE
Spec Grav, UA: 1.015 (ref 1.010–1.025)
UROBILINOGEN UA: 0.2 U/dL

## 2018-06-12 LAB — CBC WITH DIFFERENTIAL/PLATELET
Basophils Absolute: 0 10*3/uL (ref 0.0–0.1)
Basophils Relative: 0.5 % (ref 0.0–3.0)
Eosinophils Absolute: 0.1 10*3/uL (ref 0.0–0.7)
Eosinophils Relative: 2 % (ref 0.0–5.0)
HCT: 44.2 % (ref 39.0–52.0)
HEMOGLOBIN: 15 g/dL (ref 13.0–17.0)
Lymphocytes Relative: 22.8 % (ref 12.0–46.0)
Lymphs Abs: 1.6 10*3/uL (ref 0.7–4.0)
MCHC: 33.9 g/dL (ref 30.0–36.0)
MCV: 91.5 fl (ref 78.0–100.0)
MONO ABS: 0.5 10*3/uL (ref 0.1–1.0)
Monocytes Relative: 7.4 % (ref 3.0–12.0)
Neutro Abs: 4.6 10*3/uL (ref 1.4–7.7)
Neutrophils Relative %: 67.3 % (ref 43.0–77.0)
Platelets: 234 10*3/uL (ref 150.0–400.0)
RBC: 4.83 Mil/uL (ref 4.22–5.81)
RDW: 13.8 % (ref 11.5–15.5)
WBC: 6.8 10*3/uL (ref 4.0–10.5)

## 2018-06-12 MED ORDER — CIPROFLOXACIN HCL 500 MG PO TABS: 500.0000 mg | ORAL_TABLET | Freq: Two times a day (BID) | ORAL | 0 refills | Status: DC

## 2018-06-12 MED ORDER — METRONIDAZOLE 500 MG PO TABS: 500.0000 mg | ORAL_TABLET | Freq: Three times a day (TID) | ORAL | 0 refills | Status: DC

## 2018-06-12 NOTE — Patient Instructions (Signed)
Let me know if pain not resolved in one week  Avoid alcohol on the Flagyl.

## 2018-06-12 NOTE — Progress Notes (Signed)
Subjective:     Patient ID: Aaron Black, male   DOB: 1956/02/09, 62 y.o.   MRN: [id number]  HPI Patient seen with abdominal pain left lower quadrant for the past several weeks.  Symptoms are relatively stable.  Somewhat worse in the morning when he first gets up and then improve later in the day.  He had colonoscopy July 2018 which showed diverticulosis changes transverse and left colon.  No history of known diverticulitis.  He is on chronic opioids for chronic back pain does have constipation issues intermittently.  No bloody stools.  No fevers or chills.  No nausea or vomiting.  No appetite or weight changes.  No known history of kidney stones.  No clear exacerbating or alleviating factors.  Past Medical History:  Diagnosis Date  . Anemia   . Anxiety   . Arthritis   . B12 deficiency   . Cellulitis of arm, left 10/2017   after cat scratch  . Cervical disc disease    a. 07/2003 ant cervical deomcpression and fusion C5-6/C6-7;  b. 09/2007 post cervical laminectomy C4-5 with scres and arthrodesis C4-C7.  Marland Kitchen Chest pain at rest 02/25/2013  . Chronic lower back pain   . Complication of anesthesia    " i WOKE UP DURING A COLONOSCOPY "- 2008  . Depression   . Diverticulosis of colon   . GERD (gastroesophageal reflux disease)   . Gout   . Hyperlipidemia   . Hypertension   . Pleurisy   . Shortness of breath dyspnea    with exertion- "I cant exercise now"  . Tobacco abuse    a. 40 yrs, 1.5-3 ppd over that time.   Past Surgical History:  Procedure Laterality Date  . ANTERIOR LATERAL LUMBAR FUSION 4 LEVELS Right 06/05/2014   Procedure: ANTERIOR LATERAL LUMBAR FUSION  LUMBAR ONE TO LUMBAR FIVE with Percutaneous Pedicle Screws;  Surgeon: Erline Levine, MD;  Location: [facility];  Service: Neurosurgery;  Laterality: Right;  . CARPAL TUNNEL RELEASE Right   . CERVICAL DISC SURGERY     X 2  . COLONOSCOPY    . ELBOW ARTHROSCOPY Right   . KNEE ARTHROSCOPY Right 08/2013   torn menicus  . KNEE  ARTHROSCOPY Left 11/2013   chip cartlidge,torn menicus  . LUMBAR LAMINECTOMY/DECOMPRESSION MICRODISCECTOMY Right 07/11/2013   Procedure: LUMBAR LAMINECTOMY/DECOMPRESSION MICRODISCECTOMY 1 LEVEL;  Surgeon: Erline Levine, MD;  Location: [facility];  Service: Neurosurgery;  Laterality: Right;  Right L34 microdiskectomy  . LUMBAR PERCUTANEOUS PEDICLE SCREW 4 LEVEL N/A 06/05/2014   Procedure: LUMBAR PERCUTANEOUS PEDICLE SCREW 4 LEVEL;  Surgeon: Erline Levine, MD;  Location: [facility];  Service: Neurosurgery;  Laterality: N/A;  . NECK SURGERY  January 2005, March 2009   C-Spine  . ROTATOR CUFF REPAIR  August 2000, January 2002, July 2008, July 2009   2 left 2 right    reports that he has been smoking cigarettes. He has a 20.00 pack-year smoking history. He has never used smokeless tobacco. He reports that he drinks alcohol. He reports that he does not use drugs. family history includes Bone cancer in his mother; Congestive Heart Failure in his father; Diabetes in his father; Heart attack in his brother and mother; Heart failure in his father; Hypertension in his father; Other in his brother and brother; Squamous cell carcinoma in his mother; Stroke in his paternal grandfather; Sudden death in his brother. Allergies  Allergen Reactions  . Lisinopril Swelling    Swollen Lip  . Percodan [  Oxycodone-Aspirin] Rash    Percodan caused rash---but he can tolerate Percocet and aspirin on their own      Review of Systems  Constitutional: Negative for appetite change, chills and fever.  Respiratory: Negative for shortness of breath.   Cardiovascular: Negative for chest pain.  Gastrointestinal: Positive for abdominal pain and constipation. Negative for abdominal distention, blood in stool, diarrhea, nausea and vomiting.  Genitourinary: Negative for dysuria, flank pain and hematuria.       Objective:   Physical Exam  Constitutional: He appears well-developed and well-nourished.  Cardiovascular: Normal  rate and regular rhythm.  Pulmonary/Chest: Effort normal and breath sounds normal.  Abdominal: Normal appearance and bowel sounds are normal.  Very minimal tenderness left lower quadrant to deep palpation.  No guarding or rebound.  No masses noted.       Assessment:     Abdominal pain left lower quadrant.  Rule out acute diverticulitis.  Abdominal exam is nonacute.    Plan:     -Check CBC with differential and urine dipstick -Start Cipro 500 mg twice daily for 10 days and Flagyl 500 mg 3 times daily for 10 days -Avoid alcohol use while taking Flagyl -Touch base if symptoms not fully resolving in the next week -Consider CT abdomen pelvis if symptoms not resolving over the next week.  Follow-up sooner as needed  Eulas Post MD [facility]

## 2018-06-25 ENCOUNTER — Encounter: Payer: Self-pay | Admitting: Family Medicine

## 2018-06-25 ENCOUNTER — Other Ambulatory Visit: Payer: Self-pay

## 2018-06-25 ENCOUNTER — Ambulatory Visit (INDEPENDENT_AMBULATORY_CARE_PROVIDER_SITE_OTHER): Payer: Medicare HMO | Admitting: Family Medicine

## 2018-06-25 DIAGNOSIS — R1032 Left lower quadrant pain: Secondary | ICD-10-CM | POA: Diagnosis not present

## 2018-06-25 NOTE — Patient Instructions (Signed)
We will set up CT abdomen and pelvis to further evaluate.

## 2018-06-25 NOTE — Progress Notes (Signed)
Subjective:     Patient ID: Aaron Black, male   DOB: May 08, 1956, 62 y.o.   MRN: [id number]  HPI Patient is seen with some persistent left lower quadrant abdominal pain.  Refer to dictation from 06/12/2018 for details   "Patient seen with abdominal pain left lower quadrant for the past several weeks.  Symptoms are relatively stable.  Somewhat worse in the morning when he first gets up and then improve later in the day.  He had colonoscopy July 2018 which showed diverticulosis changes transverse and left colon.  No history of known diverticulitis.  He is on chronic opioids for chronic back pain does have constipation issues intermittently.  No bloody stools.  No fevers or chills.  No nausea or vomiting.  No appetite or weight changes.  No known history of kidney stones.  No clear exacerbating or alleviating factors."  Urinalysis and CBC were normal.  He had some localized tenderness left lower quadrant and we suspected possible acute diverticulitis.  We started Cipro and Flagyl but has not seen much change at all in symptoms since then.  No dysuria.  No fever.  No constipation.  Describes pain as dull ache and worse early in the morning.  No appetite or weight changes. We discussed possible CT scan if not improving.  No clear exacerbating or alleviating factors  He does have chronic low back pain and has had multiple prior back surgeries  Past Medical History:  Diagnosis Date  . Anemia   . Anxiety   . Arthritis   . B12 deficiency   . Cellulitis of arm, left 10/2017   after cat scratch  . Cervical disc disease    a. 07/2003 ant cervical deomcpression and fusion C5-6/C6-7;  b. 09/2007 post cervical laminectomy C4-5 with scres and arthrodesis C4-C7.  Marland Kitchen Chest pain at rest 02/25/2013  . Chronic lower back pain   . Complication of anesthesia    " i WOKE UP DURING A COLONOSCOPY "- 2008  . Depression   . Diverticulosis of colon   . GERD (gastroesophageal reflux disease)   . Gout   .  Hyperlipidemia   . Hypertension   . Pleurisy   . Shortness of breath dyspnea    with exertion- "I cant exercise now"  . Tobacco abuse    a. 40 yrs, 1.5-3 ppd over that time.   Past Surgical History:  Procedure Laterality Date  . ANTERIOR LATERAL LUMBAR FUSION 4 LEVELS Right 06/05/2014   Procedure: ANTERIOR LATERAL LUMBAR FUSION  LUMBAR ONE TO LUMBAR FIVE with Percutaneous Pedicle Screws;  Surgeon: Erline Levine, MD;  Location: [facility];  Service: Neurosurgery;  Laterality: Right;  . CARPAL TUNNEL RELEASE Right   . CERVICAL DISC SURGERY     X 2  . COLONOSCOPY    . ELBOW ARTHROSCOPY Right   . KNEE ARTHROSCOPY Right 08/2013   torn menicus  . KNEE ARTHROSCOPY Left 11/2013   chip cartlidge,torn menicus  . LUMBAR LAMINECTOMY/DECOMPRESSION MICRODISCECTOMY Right 07/11/2013   Procedure: LUMBAR LAMINECTOMY/DECOMPRESSION MICRODISCECTOMY 1 LEVEL;  Surgeon: Erline Levine, MD;  Location: [place];  Service: Neurosurgery;  Laterality: Right;  Right L34 microdiskectomy  . LUMBAR PERCUTANEOUS PEDICLE SCREW 4 LEVEL N/A 06/05/2014   Procedure: LUMBAR PERCUTANEOUS PEDICLE SCREW 4 LEVEL;  Surgeon: Erline Levine, MD;  Location: [facility];  Service: Neurosurgery;  Laterality: N/A;  . NECK SURGERY  January 2005, March 2009   C-Spine  . ROTATOR CUFF REPAIR  August 2000, January 2002, July 2008,  July 2009   2 left 2 right    reports that he has been smoking cigarettes. He has a 20.00 pack-year smoking history. He has never used smokeless tobacco. He reports that he drinks alcohol. He reports that he does not use drugs. family history includes Bone cancer in his mother; Congestive Heart Failure in his father; Diabetes in his father; Heart attack in his brother and mother; Heart failure in his father; Hypertension in his father; Other in his brother and brother; Squamous cell carcinoma in his mother; Stroke in his paternal grandfather; Sudden death in his brother. Allergies  Allergen Reactions  .  Lisinopril Swelling    Swollen Lip  . Percodan [Oxycodone-Aspirin] Rash    Percodan caused rash---but he can tolerate Percocet and aspirin on their own      Review of Systems  Constitutional: Negative for appetite change, chills, fever and unexpected weight change.  Respiratory: Negative for shortness of breath.   Cardiovascular: Negative for chest pain.  Gastrointestinal: Positive for abdominal pain. Negative for blood in stool, constipation, diarrhea, nausea and vomiting.       Objective:   Physical Exam  Constitutional: He appears well-developed and well-nourished.  Cardiovascular: Normal rate and regular rhythm.  Pulmonary/Chest: Effort normal and breath sounds normal.  Abdominal: Soft. Bowel sounds are normal.  Mild tenderness left lower quadrant to deep palpation.  No masses.  No hernia palpated.  No guarding or rebound.       Assessment:     Approximately 3-week history of persistent left lower quadrant abdominal pain unimproved with Flagyl and Cipro.   No red flags such as weight loss, bloody stools, fever, vomiting, etc.    Plan:     -Set up CT abdomen and pelvis to further assess.  Follow-up immediately for any fever, worsening pain, or other concerns -if CT normal, consider possible related to neuropathic pain from back.  Eulas Post MD [facility] at [facility]

## 2018-07-08 ENCOUNTER — Other Ambulatory Visit: Payer: Self-pay | Admitting: Internal Medicine

## 2018-07-15 ENCOUNTER — Ambulatory Visit (INDEPENDENT_AMBULATORY_CARE_PROVIDER_SITE_OTHER)
Admission: RE | Admit: 2018-07-15 | Discharge: 2018-07-15 | Disposition: A | Discharge status: Home / Self Care | Payer: 59 | Source: Ambulatory Visit | Attending: Family Medicine | Admitting: Family Medicine

## 2018-07-15 DIAGNOSIS — R1032 Left lower quadrant pain: Secondary | ICD-10-CM | POA: Diagnosis not present

## 2018-07-15 DIAGNOSIS — K5730 Diverticulosis of large intestine without perforation or abscess without bleeding: Secondary | ICD-10-CM | POA: Diagnosis not present

## 2018-07-15 DIAGNOSIS — K760 Fatty (change of) liver, not elsewhere classified: Secondary | ICD-10-CM | POA: Diagnosis not present

## 2018-07-16 ENCOUNTER — Other Ambulatory Visit: Payer: Self-pay | Admitting: *Deleted

## 2018-07-16 ENCOUNTER — Telehealth: Payer: Self-pay

## 2018-07-16 MED ORDER — ALPRAZOLAM 0.5 MG PO TABS: 0.5000 mg | ORAL_TABLET | Freq: Three times a day (TID) | ORAL | 0 refills | Status: DC | PRN

## 2018-07-16 NOTE — Telephone Encounter (Signed)
Called patient and spoke to his wife and gave her and patient the CT results. They verbalized an understanding.

## 2018-07-16 NOTE — Telephone Encounter (Signed)
Last Rx given by Dr Burnice Logan on 10/22 for #90 with 1 ref

## 2018-07-16 NOTE — Telephone Encounter (Signed)
Copied from [place] [id number]. Topic: General - Inquiry >> Jul 16, 2018  9:15 AM Berneta Levins wrote: Reason for CRM:  Pt's wife calling to get results of CT done yesterday.   Rosann Auerbach can be reached at [phone]

## 2018-07-25 ENCOUNTER — Other Ambulatory Visit: Payer: Self-pay | Admitting: Adult Health

## 2018-07-25 MED ORDER — ALLOPURINOL 100 MG PO TABS: 100.0000 mg | ORAL_TABLET | Freq: Every day | ORAL | 0 refills | Status: DC

## 2018-07-25 NOTE — Telephone Encounter (Signed)
Copied from [place] [id number]. Topic: Quick Communication - Rx Refill/Question >> Jul 25, 2018 10:19 AM Robina Ade, Helene Kelp D wrote: Medication: allopurinol (ZYLOPRIM) 100 MG tablet  Has the patient contacted their pharmacy? Yes since 07/16/18 (Agent: If no, request that the patient contact the pharmacy for the refill.) (Agent: If yes, when and what did the pharmacy advise?)  Preferred Pharmacy (with phone number or street name): [id number] - [place], [place] - [address]  Agent: Please be advised that RX refills may take up to 3 business days. We ask that you follow-up with your pharmacy.

## 2018-08-21 DIAGNOSIS — M5030 Other cervical disc degeneration, unspecified cervical region: Secondary | ICD-10-CM | POA: Diagnosis not present

## 2018-08-21 DIAGNOSIS — M1A09X0 Idiopathic chronic gout, multiple sites, without tophus (tophi): Secondary | ICD-10-CM | POA: Diagnosis not present

## 2018-08-21 DIAGNOSIS — Z6825 Body mass index (BMI) 25.0-25.9, adult: Secondary | ICD-10-CM | POA: Diagnosis not present

## 2018-08-21 DIAGNOSIS — E663 Overweight: Secondary | ICD-10-CM | POA: Diagnosis not present

## 2018-08-21 DIAGNOSIS — M150 Primary generalized (osteo)arthritis: Secondary | ICD-10-CM | POA: Diagnosis not present

## 2018-08-21 DIAGNOSIS — N183 Chronic kidney disease, stage 3 (moderate): Secondary | ICD-10-CM | POA: Diagnosis not present

## 2018-08-23 ENCOUNTER — Other Ambulatory Visit: Payer: Self-pay | Admitting: Adult Health

## 2018-08-23 NOTE — Telephone Encounter (Signed)
Filled for 1 month on 07/16/2018.

## 2018-09-13 DIAGNOSIS — M25552 Pain in left hip: Secondary | ICD-10-CM | POA: Diagnosis not present

## 2018-09-17 ENCOUNTER — Inpatient Hospital Stay: Admission: RE | Admit: 2018-09-17 | Payer: 59 | Source: Ambulatory Visit

## 2018-09-18 ENCOUNTER — Encounter: Payer: Self-pay | Admitting: Adult Health

## 2018-09-18 ENCOUNTER — Ambulatory Visit (INDEPENDENT_AMBULATORY_CARE_PROVIDER_SITE_OTHER): Payer: 59 | Admitting: Adult Health

## 2018-09-18 VITALS — BP 160/100 | Temp 98.4°F | Wt 173.0 lb

## 2018-09-18 DIAGNOSIS — I10 Essential (primary) hypertension: Secondary | ICD-10-CM

## 2018-09-18 DIAGNOSIS — F419 Anxiety disorder, unspecified: Secondary | ICD-10-CM

## 2018-09-18 DIAGNOSIS — E785 Hyperlipidemia, unspecified: Secondary | ICD-10-CM

## 2018-09-18 DIAGNOSIS — R69 Illness, unspecified: Secondary | ICD-10-CM | POA: Diagnosis not present

## 2018-09-18 DIAGNOSIS — M1000 Idiopathic gout, unspecified site: Secondary | ICD-10-CM | POA: Diagnosis not present

## 2018-09-18 DIAGNOSIS — Z0000 Encounter for general adult medical examination without abnormal findings: Secondary | ICD-10-CM | POA: Diagnosis not present

## 2018-09-18 DIAGNOSIS — Z720 Tobacco use: Secondary | ICD-10-CM | POA: Diagnosis not present

## 2018-09-18 DIAGNOSIS — Z125 Encounter for screening for malignant neoplasm of prostate: Secondary | ICD-10-CM

## 2018-09-18 LAB — LDL CHOLESTEROL, DIRECT: LDL DIRECT: 132 mg/dL

## 2018-09-18 LAB — COMPREHENSIVE METABOLIC PANEL
ALBUMIN: 4.8 g/dL (ref 3.5–5.2)
ALK PHOS: 96 U/L (ref 39–117)
ALT: 50 U/L (ref 0–53)
AST: 44 U/L — AB (ref 0–37)
BUN: 19 mg/dL (ref 6–23)
CALCIUM: 9.7 mg/dL (ref 8.4–10.5)
CHLORIDE: 100 meq/L (ref 96–112)
CO2: 24 mEq/L (ref 19–32)
CREATININE: 1.11 mg/dL (ref 0.40–1.50)
GFR: 67.31 mL/min (ref >60.00)
Glucose, Bld: 96 mg/dL (ref 70–99)
POTASSIUM: 4.6 meq/L (ref 3.5–5.1)
SODIUM: 135 meq/L (ref 135–145)
TOTAL PROTEIN: 7.5 g/dL (ref 6.0–8.3)
Total Bilirubin: 0.4 mg/dL (ref 0.2–1.2)

## 2018-09-18 LAB — CBC WITH DIFFERENTIAL/PLATELET
BASOS PCT: 0.4 % (ref 0.0–3.0)
Basophils Absolute: 0 10*3/uL (ref 0.0–0.1)
EOS PCT: 1.4 % (ref 0.0–5.0)
Eosinophils Absolute: 0.1 10*3/uL (ref 0.0–0.7)
HEMATOCRIT: 40.7 % (ref 39.0–52.0)
HEMOGLOBIN: 13.9 g/dL (ref 13.0–17.0)
LYMPHS PCT: 24.7 % (ref 12.0–46.0)
Lymphs Abs: 1.8 10*3/uL (ref 0.7–4.0)
MCHC: 34.2 g/dL (ref 30.0–36.0)
MCV: 91.8 fl (ref 78.0–100.0)
MONO ABS: 0.6 10*3/uL (ref 0.1–1.0)
Monocytes Relative: 8.7 % (ref 3.0–12.0)
Neutro Abs: 4.6 10*3/uL (ref 1.4–7.7)
Neutrophils Relative %: 64.8 % (ref 43.0–77.0)
Platelets: 239 10*3/uL (ref 150.0–400.0)
RBC: 4.44 Mil/uL (ref 4.22–5.81)
RDW: 13.5 % (ref 11.5–15.5)
WBC: 7.1 10*3/uL (ref 4.0–10.5)

## 2018-09-18 LAB — LIPID PANEL
CHOLESTEROL: 223 mg/dL — AB (ref 0–200)
HDL: 60.8 mg/dL (ref >39.00)
NonHDL: 162.54
TRIGLYCERIDES: 248 mg/dL — AB (ref 0.0–149.0)
Total CHOL/HDL Ratio: 4
VLDL: 49.6 mg/dL — ABNORMAL HIGH (ref 0.0–40.0)

## 2018-09-18 NOTE — Progress Notes (Signed)
Patient presents to clinic today to establish care. Pleasant 63 year old male who  has a past medical history of Anemia, Anxiety, Arthritis, B12 deficiency, Cellulitis of arm, left (10/2017), Cervical disc disease, Chest pain at rest (02/25/2013), Chronic lower back pain, Complication of anesthesia, Depression, Diverticulosis of colon, GERD (gastroesophageal reflux disease), Gout, Hyperlipidemia, Hypertension, Pleurisy, Shortness of breath dyspnea, and Tobacco abuse.  The former patient of Dr. Burnice Logan  His last CPE was in 2017   Acute Concerns: Establish Care  Chronic Issues: Essential hypertension - Cozaar 100 mg daily and Cardizem 360 mg extended release daily. He did not take his medications on a daily basis   BP Readings from Last 3 Encounters:  09/18/18 (!) 160/100  06/25/18 108/72  06/12/18 (!) 138/92   Hyperlipidemia-takes Lipitor 20 mg daily Lab Results  Component Value Date   CHOL 233 (H) 06/27/2016   HDL 63.00 06/27/2016   LDLCALC 136 (H) 06/27/2016   LDLDIRECT 129.6 10/03/2012   TRIG 171.0 (H) 06/27/2016   CHOLHDL 4 06/27/2016    Anxiety -currently prescribed Zoloft 50 mg daily and Xanax 0.5 mg 3 times daily as needed. He reports his anxiety is well controlled.   H/o Gout -takes allopurinol 100 mg daily and has a prescription for colchicine to take as needed for acute gout flares. He has no acute gout flares.   Tobacco Use -Continues to smoke about a pack a day. He has tried Chantix in the past but did not have a good reaction.   Osteoarthritis -is followed by rheumatology, Dr. Marijean Bravo. Is currently prescribed Norco which helps with the pain. Has had numerous orthopedics surgeries over the past.   Health Maintenance: Dental -- Routine Care - Dr. Randol Kern  Vision -- Routine Care - Dr. Claudean Kinds  Immunizations -- utd Colonoscopy -- Due in 2021 - history of colon polyps  Diet: Eats healthy  Exercise: Is unable to exercise due to chronic arthritis  .    Past Medical History:  Diagnosis Date  . Anemia   . Anxiety   . Arthritis   . B12 deficiency   . Cellulitis of arm, left 10/2017   after cat scratch  . Cervical disc disease    a. 07/2003 ant cervical deomcpression and fusion C5-6/C6-7;  b. 09/2007 post cervical laminectomy C4-5 with scres and arthrodesis C4-C7.  Marland Kitchen Chest pain at rest 02/25/2013  . Chronic lower back pain   . Complication of anesthesia    " i WOKE UP DURING A COLONOSCOPY "- 2008  . Depression   . Diverticulosis of colon   . GERD (gastroesophageal reflux disease)   . Gout   . Hyperlipidemia   . Hypertension   . Pleurisy   . Shortness of breath dyspnea    with exertion- "I cant exercise now"  . Tobacco abuse    a. 40 yrs, 1.5-3 ppd over that time.    Past Surgical History:  Procedure Laterality Date  . ANTERIOR LATERAL LUMBAR FUSION 4 LEVELS Right 06/05/2014   Procedure: ANTERIOR LATERAL LUMBAR FUSION  LUMBAR ONE TO LUMBAR FIVE with Percutaneous Pedicle Screws;  Surgeon: Erline Levine, MD;  Location: [facility];  Service: Neurosurgery;  Laterality: Right;  . CARPAL TUNNEL RELEASE Right   . CERVICAL DISC SURGERY     X 2  . COLONOSCOPY    . ELBOW ARTHROSCOPY Right   . KNEE ARTHROSCOPY Right 08/2013   torn menicus  . KNEE ARTHROSCOPY Left 11/2013   chip cartlidge,torn menicus  .  LUMBAR LAMINECTOMY/DECOMPRESSION MICRODISCECTOMY Right 07/11/2013   Procedure: LUMBAR LAMINECTOMY/DECOMPRESSION MICRODISCECTOMY 1 LEVEL;  Surgeon: Erline Levine, MD;  Location: [facility];  Service: Neurosurgery;  Laterality: Right;  Right L34 microdiskectomy  . LUMBAR PERCUTANEOUS PEDICLE SCREW 4 LEVEL N/A 06/05/2014   Procedure: LUMBAR PERCUTANEOUS PEDICLE SCREW 4 LEVEL;  Surgeon: Erline Levine, MD;  Location: [facility];  Service: Neurosurgery;  Laterality: N/A;  . NECK SURGERY  January 2005, March 2009   C-Spine  . ROTATOR CUFF REPAIR  August 2000, January 2002, July 2008, July 2009   2 left 2 right    Current  Outpatient Medications on File Prior to Visit  Medication Sig Dispense Refill  . albuterol (PROVENTIL HFA;VENTOLIN HFA) 108 (90 Base) MCG/ACT inhaler Inhale 2 puffs into the lungs every 6 (six) hours as needed for wheezing or shortness of breath. 1 Inhaler 3  . allopurinol (ZYLOPRIM) 100 MG tablet Take 1 tablet (100 mg total) by mouth daily. 90 tablet 0  . ALPRAZolam (XANAX) 0.5 MG tablet TAKE 1 TABLET BY MOUTH 3 TIMES A DAY AS NEEDED FOR ANXIETY 90 tablet 0  . atorvastatin (LIPITOR) 20 MG tablet Take 1 tablet (20 mg total) by mouth daily. 90 tablet 1  . colchicine 0.6 MG tablet TAKE 1 TABLET (0.6 MG TOTAL) BY MOUTH DAILY AS NEEDED (GOUT). 90 tablet 1  . diclofenac sodium (VOLTAREN) 1 % GEL Apply 2 g topically as needed (for pain).   0  . diltiazem (CARDIZEM CD) 360 MG 24 hr capsule Take 1 capsule (360 mg total) by mouth daily. 90 capsule 3  . HYDROcodone-acetaminophen (NORCO/VICODIN) 5-325 MG tablet Take 1 tablet by mouth every 8 (eight) hours as needed for moderate pain.   0  . losartan (COZAAR) 100 MG tablet Take 1 tablet (100 mg total) by mouth daily. 90 tablet 3  . omeprazole (PRILOSEC OTC) 20 MG tablet Take 2 tablets (40 mg total) by mouth daily.    . sertraline (ZOLOFT) 50 MG tablet Take 1 tablet (50 mg total) by mouth daily. 90 tablet 3  . vitamin B-12 (CYANOCOBALAMIN) 1000 MCG tablet Take 1,000 mcg by mouth daily.     No current facility-administered medications on file prior to visit.     Allergies  Allergen Reactions  . Lisinopril Swelling    Swollen Lip  . Percodan [Oxycodone-Aspirin] Rash    Percodan caused rash---but he can tolerate Percocet and aspirin on their own     Family History  Problem Relation Age of Onset  . Bone cancer Mother   . Squamous cell carcinoma Mother        died @ 20  . Heart attack Mother   . Hypertension Father   . Diabetes Father        borderline  . Congestive Heart Failure Father        alive @ 77  . Heart failure Father   . Sudden death  Brother        died @ 65  . Other Brother        died in [place] @ 8 - struck by drunk driver  . Other Brother        died in MVA @ 56 - struck by drunk driver  . Heart attack Brother   . Stroke Paternal Grandfather   . Colon cancer Neg Hx   . Esophageal cancer Neg Hx   . Rectal cancer Neg Hx   . Stomach cancer Neg Hx     Social History  Socioeconomic History  . Marital status: Married    Spouse name: Not on file  . Number of children: Not on file  . Years of education: Not on file  . Highest education level: Not on file  Occupational History  . Not on file  Social Needs  . Financial resource strain: Not on file  . Food insecurity:    Worry: Not on file    Inability: Not on file  . Transportation needs:    Medical: Not on file    Non-medical: Not on file  Tobacco Use  . Smoking status: Current Some Day Smoker    Packs/day: 0.50    Years: 40.00    Pack years: 20.00    Types: Cigarettes  . Smokeless tobacco: Never Used  . Tobacco comment: Currently smoking 1.5 ppd.  Has previously smoked up to 3 ppd and did so for about 20 yrs.  Substance and Sexual Activity  . Alcohol use: Yes    Comment: occasional  . Drug use: No  . Sexual activity: Not on file  Lifestyle  . Physical activity:    Days per week: Not on file    Minutes per session: Not on file  . Stress: Not on file  Relationships  . Social connections:    Talks on phone: Not on file    Gets together: Not on file    Attends religious service: Not on file    Active member of club or organization: Not on file    Attends meetings of clubs or organizations: Not on file    Relationship status: Not on file  . Intimate partner violence:    Fear of current or ex partner: Not on file    Emotionally abused: Not on file    Physically abused: Not on file    Forced sexual activity: Not on file  Other Topics Concern  . Not on file  Social History Narrative   Lives in [place] with wife.  Does not work.  Previously read H2O  meters for city of [place].    Review of Systems  Constitutional: Negative.   HENT: Negative.   Eyes: Negative.   Respiratory: Negative.   Cardiovascular: Negative.   Gastrointestinal: Negative.   Genitourinary: Negative.   Musculoskeletal: Positive for back pain and joint pain.  Skin: Negative.   Neurological: Negative.   Endo/Heme/Allergies: Negative.   Psychiatric/Behavioral: Negative.   All other systems reviewed and are negative.     BP (!) 160/100 Comment: No meds  Temp 98.4 F (36.9 C)   Wt 173 lb (78.5 kg)   BMI 27.10 kg/m   Physical Exam Vitals signs and nursing note reviewed.  Constitutional:      General: He is not in acute distress.    Appearance: Normal appearance. He is well-developed and normal weight. He is not diaphoretic.  HENT:     Head: Normocephalic and atraumatic.     Right Ear: Tympanic membrane, ear canal and external ear normal. There is no impacted cerumen.     Left Ear: Tympanic membrane, ear canal and external ear normal. There is no impacted cerumen.     Nose: Nose normal. No congestion or rhinorrhea.     Mouth/Throat:     Mouth: Mucous membranes are moist.     Pharynx: Oropharynx is clear. No oropharyngeal exudate.  Eyes:     General:        Right eye: No discharge.        Left eye: No discharge.  Extraocular Movements: Extraocular movements intact.     Conjunctiva/sclera: Conjunctivae normal.     Pupils: Pupils are equal, round, and reactive to light.  Neck:     Musculoskeletal: Normal range of motion. No neck rigidity or muscular tenderness.     Thyroid: No thyromegaly.     Vascular: No carotid bruit.     Trachea: No tracheal deviation.  Cardiovascular:     Rate and Rhythm: Normal rate and regular rhythm.     Heart sounds: Normal heart sounds. No murmur. No friction rub. No gallop.   Pulmonary:     Effort: Pulmonary effort is normal. No respiratory distress.     Breath sounds: Normal breath sounds. No stridor. No wheezing,  rhonchi or rales.  Chest:     Chest wall: No tenderness.  Abdominal:     General: Bowel sounds are normal. There is no distension.     Palpations: Abdomen is soft. There is no mass.     Tenderness: There is no abdominal tenderness. There is no right CVA tenderness, left CVA tenderness, guarding or rebound.     Hernia: No hernia is present.  Musculoskeletal: Normal range of motion.        General: Tenderness present. No swelling, deformity or signs of injury.     Right lower leg: No edema.     Left lower leg: No edema.  Lymphadenopathy:     Cervical: No cervical adenopathy.  Skin:    General: Skin is warm and dry.     Capillary Refill: Capillary refill takes less than 2 seconds.     Coloration: Skin is not jaundiced or pale.     Findings: No bruising, erythema, lesion or rash.  Neurological:     General: No focal deficit present.     Mental Status: He is alert and oriented to person, place, and time. Mental status is at baseline.     Cranial Nerves: No cranial nerve deficit.     Coordination: Coordination normal.  Psychiatric:        Mood and Affect: Mood normal.        Behavior: Behavior normal.        Thought Content: Thought content normal.        Judgment: Judgment normal.    Assessment/Plan: 1. Routine general medical examination at a health care facility - Needs to quit smoking  - Follow up in one year or sooner if needed - CBC with Differential/Platelet - Comprehensive metabolic panel - Lipid panel - TSH  2. Tobacco abuse - Encouraged to quit.   3. Idiopathic gout, unspecified chronicity, unspecified site - Continue Allopurinol   4. Hyperlipidemia, unspecified hyperlipidemia type - Consider change in statin  - CBC with Differential/Platelet - Comprehensive metabolic panel - Lipid panel - TSH  5. Essential hypertension - Appears to be well controlled when he takes medication  - CBC with Differential/Platelet - Comprehensive metabolic panel - Lipid  panel - TSH  6. Prostate cancer screening  - PSA  7. Anxiety disorder, unspecified type - Ok to continue Zoloft and Zanax   [employer]

## 2018-09-19 ENCOUNTER — Ambulatory Visit (INDEPENDENT_AMBULATORY_CARE_PROVIDER_SITE_OTHER)
Admission: RE | Admit: 2018-09-19 | Discharge: 2018-09-19 | Disposition: A | Discharge status: Home / Self Care | Payer: 59 | Source: Ambulatory Visit | Attending: Acute Care | Admitting: Acute Care

## 2018-09-19 ENCOUNTER — Other Ambulatory Visit: Payer: Self-pay | Admitting: Family Medicine

## 2018-09-19 DIAGNOSIS — F17210 Nicotine dependence, cigarettes, uncomplicated: Secondary | ICD-10-CM | POA: Diagnosis not present

## 2018-09-19 DIAGNOSIS — Z122 Encounter for screening for malignant neoplasm of respiratory organs: Secondary | ICD-10-CM

## 2018-09-19 DIAGNOSIS — R69 Illness, unspecified: Secondary | ICD-10-CM | POA: Diagnosis not present

## 2018-09-19 LAB — TSH: TSH: 0.63 u[IU]/mL (ref 0.35–4.50)

## 2018-09-19 LAB — PSA: PSA: 0.48 ng/mL (ref 0.10–4.00)

## 2018-09-19 MED ORDER — ATORVASTATIN CALCIUM 40 MG PO TABS: 40.0000 mg | ORAL_TABLET | Freq: Every day | ORAL | 3 refills | Status: DC

## 2018-09-19 NOTE — Telephone Encounter (Signed)
Sent to the pharmacy by e-scribe. 

## 2018-09-26 ENCOUNTER — Other Ambulatory Visit: Payer: Self-pay | Admitting: Acute Care

## 2018-09-26 DIAGNOSIS — F17210 Nicotine dependence, cigarettes, uncomplicated: Principal | ICD-10-CM

## 2018-09-26 DIAGNOSIS — Z122 Encounter for screening for malignant neoplasm of respiratory organs: Secondary | ICD-10-CM

## 2018-09-27 ENCOUNTER — Other Ambulatory Visit: Payer: Self-pay | Admitting: Adult Health

## 2018-10-08 ENCOUNTER — Other Ambulatory Visit: Payer: Self-pay | Admitting: Internal Medicine

## 2018-10-13 ENCOUNTER — Other Ambulatory Visit: Payer: Self-pay | Admitting: Adult Health

## 2018-11-11 ENCOUNTER — Other Ambulatory Visit: Payer: Self-pay | Admitting: Adult Health

## 2018-12-17 ENCOUNTER — Other Ambulatory Visit: Payer: Self-pay | Admitting: Adult Health

## 2018-12-24 DIAGNOSIS — M5030 Other cervical disc degeneration, unspecified cervical region: Secondary | ICD-10-CM | POA: Diagnosis not present

## 2018-12-24 DIAGNOSIS — M1A09X0 Idiopathic chronic gout, multiple sites, without tophus (tophi): Secondary | ICD-10-CM | POA: Diagnosis not present

## 2018-12-24 DIAGNOSIS — M150 Primary generalized (osteo)arthritis: Secondary | ICD-10-CM | POA: Diagnosis not present

## 2018-12-24 DIAGNOSIS — N183 Chronic kidney disease, stage 3 (moderate): Secondary | ICD-10-CM | POA: Diagnosis not present

## 2019-01-21 ENCOUNTER — Other Ambulatory Visit: Payer: Self-pay | Admitting: Adult Health

## 2019-01-21 DIAGNOSIS — H5213 Myopia, bilateral: Secondary | ICD-10-CM | POA: Diagnosis not present

## 2019-01-21 DIAGNOSIS — H2513 Age-related nuclear cataract, bilateral: Secondary | ICD-10-CM | POA: Diagnosis not present

## 2019-01-21 DIAGNOSIS — H25013 Cortical age-related cataract, bilateral: Secondary | ICD-10-CM | POA: Diagnosis not present

## 2019-01-21 DIAGNOSIS — H52203 Unspecified astigmatism, bilateral: Secondary | ICD-10-CM | POA: Diagnosis not present

## 2019-01-24 ENCOUNTER — Emergency Department (HOSPITAL_COMMUNITY)
Admission: EM | Admit: 2019-01-24 | Discharge: 2019-01-24 | Disposition: A | Discharge status: Home / Self Care | Payer: 59 | Attending: Emergency Medicine | Admitting: Emergency Medicine

## 2019-01-24 ENCOUNTER — Emergency Department (HOSPITAL_COMMUNITY): Payer: 59

## 2019-01-24 ENCOUNTER — Other Ambulatory Visit: Payer: Self-pay

## 2019-01-24 ENCOUNTER — Encounter (HOSPITAL_COMMUNITY): Payer: Self-pay | Admitting: Emergency Medicine

## 2019-01-24 DIAGNOSIS — I10 Essential (primary) hypertension: Secondary | ICD-10-CM | POA: Diagnosis not present

## 2019-01-24 DIAGNOSIS — R0789 Other chest pain: Secondary | ICD-10-CM | POA: Diagnosis not present

## 2019-01-24 DIAGNOSIS — Z79899 Other long term (current) drug therapy: Secondary | ICD-10-CM | POA: Diagnosis not present

## 2019-01-24 DIAGNOSIS — R69 Illness, unspecified: Secondary | ICD-10-CM | POA: Diagnosis not present

## 2019-01-24 DIAGNOSIS — F17210 Nicotine dependence, cigarettes, uncomplicated: Secondary | ICD-10-CM | POA: Insufficient documentation

## 2019-01-24 DIAGNOSIS — R079 Chest pain, unspecified: Secondary | ICD-10-CM | POA: Diagnosis not present

## 2019-01-24 LAB — CBC
HCT: 40.6 % (ref 39.0–52.0)
Hemoglobin: 13.6 g/dL (ref 13.0–17.0)
MCH: 31.3 pg (ref 26.0–34.0)
MCHC: 33.5 g/dL (ref 30.0–36.0)
MCV: 93.5 fL (ref 80.0–100.0)
Platelets: 246 10*3/uL (ref 150–400)
RBC: 4.34 MIL/uL (ref 4.22–5.81)
RDW: 13.2 % (ref 11.5–15.5)
WBC: 7.4 10*3/uL (ref 4.0–10.5)
nRBC: 0 % (ref 0.0–0.2)

## 2019-01-24 LAB — TROPONIN I (HIGH SENSITIVITY)
Troponin I (High Sensitivity): <2 ng/L (ref <18)
Troponin I (High Sensitivity): <2 ng/L (ref <18)

## 2019-01-24 LAB — BASIC METABOLIC PANEL
Anion gap: 11 (ref 5–15)
BUN: 16 mg/dL (ref 8–23)
CO2: 21 mmol/L — ABNORMAL LOW (ref 22–32)
Calcium: 9.1 mg/dL (ref 8.9–10.3)
Chloride: 103 mmol/L (ref 98–111)
Creatinine, Ser: 1.37 mg/dL — ABNORMAL HIGH (ref 0.61–1.24)
GFR calc Af Amer: >60 mL/min (ref >60)
GFR calc non Af Amer: 55 mL/min — ABNORMAL LOW (ref >60)
Glucose, Bld: 114 mg/dL — ABNORMAL HIGH (ref 70–99)
Potassium: 4.2 mmol/L (ref 3.5–5.1)
Sodium: 135 mmol/L (ref 135–145)

## 2019-01-24 NOTE — ED Triage Notes (Signed)
Pt arrives via EMS from home with reports of eft sided CP, no radiation x3 days. Describes the pain as dull and sharpens with deep inhalation. EMS gave 324 ASA, 2 nitro. States his brother died from MI.

## 2019-01-24 NOTE — Discharge Instructions (Addendum)
Come to the ED if you notice crushing chest pain or pain that radiates to the shoulder and back and if you notice excessive sweating.

## 2019-01-24 NOTE — ED Provider Notes (Signed)
[facility] EMERGENCY DEPARTMENT Provider Note   CSN: [id number] Arrival date & time: 01/24/19  1010    History   Chief Complaint Chief Complaint  Patient presents with  . Chest Pain    HPI Aaron Black is a 63 y.o. male, with a PMHx of HLD, HTN, and GERD, that presents to the ED for chest pain. Mr. Aaron Black stated that after he defecated this morning he noticed pain in his left chest with no radiation. The pain was sharp in nature and described as a 6/10 on the pain scale. Mr. Aaron Black wife called EMS and was instructed to take 324 mg ASA. Patient states that deep breaths aggravate the pain, and that the nitro he received in the ambulance relieved his pain from a 6/10 to a 3/10. Patient expressed his concern about an MI because his brother passed 5 years ago due to a MI. He denies fever or nausea.       Chest Pain Associated symptoms: no abdominal pain, no back pain, no cough, no fever, no palpitations, no shortness of breath and no vomiting     Past Medical History:  Diagnosis Date  . Anemia   . Anxiety   . Arthritis   . B12 deficiency   . Cellulitis of arm, left 10/2017   after cat scratch  . Cervical disc disease    a. 07/2003 ant cervical deomcpression and fusion C5-6/C6-7;  b. 09/2007 post cervical laminectomy C4-5 with scres and arthrodesis C4-C7.  Marland Kitchen Chest pain at rest 02/25/2013  . Chronic lower back pain   . Complication of anesthesia    " i WOKE UP DURING A COLONOSCOPY "- 2008  . Depression   . Diverticulosis of colon   . GERD (gastroesophageal reflux disease)   . Gout   . Hyperlipidemia   . Hypertension   . Pleurisy   . Shortness of breath dyspnea    with exertion- "I cant exercise now"  . Tobacco abuse    a. 40 yrs, 1.5-3 ppd over that time.    Patient Active Problem List   Diagnosis Date Noted  . Encounter for tobacco use cessation counseling 11/01/2017  . Pseudoarthrosis of lumbar spine 01/21/2016  . Lumbar spine scoliosis  06/05/2014  . Herniated lumbar disc without myelopathy 07/11/2013  . Tobacco abuse 09/25/2011  . Gout 05/27/2009  . VITAMIN B12 DEFICIENCY 09/08/2008  . HLD (hyperlipidemia) 09/08/2008  . COLONIC POLYPS, HX OF 09/08/2008  . Anemia, unspecified 06/30/2008  . Anxiety disorder 06/29/2008  . Essential hypertension 06/29/2008  . GERD 06/29/2008  . Diverticulosis of colon 06/29/2008  . ARTHRITIS 06/29/2008  . HERNIATED DISC 06/29/2008    Past Surgical History:  Procedure Laterality Date  . ANTERIOR LATERAL LUMBAR FUSION 4 LEVELS Right 06/05/2014   Procedure: ANTERIOR LATERAL LUMBAR FUSION  LUMBAR ONE TO LUMBAR FIVE with Percutaneous Pedicle Screws;  Surgeon: Erline Levine, MD;  Location: [facility];  Service: Neurosurgery;  Laterality: Right;  . CARPAL TUNNEL RELEASE Right   . CERVICAL DISC SURGERY     X 2  . COLONOSCOPY    . ELBOW ARTHROSCOPY Right   . KNEE ARTHROSCOPY Right 08/2013   torn menicus  . KNEE ARTHROSCOPY Left 11/2013   chip cartlidge,torn menicus  . LUMBAR LAMINECTOMY/DECOMPRESSION MICRODISCECTOMY Right 07/11/2013   Procedure: LUMBAR LAMINECTOMY/DECOMPRESSION MICRODISCECTOMY 1 LEVEL;  Surgeon: Erline Levine, MD;  Location: [facility];  Service: Neurosurgery;  Laterality: Right;  Right L34 microdiskectomy  . LUMBAR PERCUTANEOUS PEDICLE SCREW 4  LEVEL N/A 06/05/2014   Procedure: LUMBAR PERCUTANEOUS PEDICLE SCREW 4 LEVEL;  Surgeon: Erline Levine, MD;  Location: [facility];  Service: Neurosurgery;  Laterality: N/A;  . NECK SURGERY  January 2005, March 2009   C-Spine  . ROTATOR CUFF REPAIR  August 2000, January 2002, July 2008, July 2009   2 left 2 right        Home Medications    Prior to Admission medications   Medication Sig Start Date End Date Taking? Authorizing Provider  albuterol (PROVENTIL HFA;VENTOLIN HFA) 108 (90 Base) MCG/ACT inhaler Inhale 2 puffs into the lungs every 6 (six) hours as needed for wheezing or shortness of breath. 03/05/17  Yes Marletta Lor, MD  allopurinol (ZYLOPRIM) 100 MG tablet TAKE 1 TABLET BY MOUTH EVERY DAY Patient taking differently: Take 100 mg by mouth daily.  10/15/18  Yes Nafziger, Tommi Rumps, NP  ALPRAZolam (XANAX) 0.5 MG tablet TAKE 1 TABLET BY MOUTH THREE TIMES A DAY AS NEEDED FOR ANXIETY Patient taking differently: Take 0.5 mg by mouth 2 (two) times daily.  01/21/19  Yes Nafziger, Tommi Rumps, NP  atorvastatin (LIPITOR) 40 MG tablet Take 1 tablet (40 mg total) by mouth daily. 09/19/18  Yes Nafziger, Tommi Rumps, NP  colchicine 0.6 MG tablet TAKE 1 TABLET (0.6 MG TOTAL) BY MOUTH DAILY AS NEEDED (GOUT). 03/20/18  Yes Marletta Lor, MD  cyclobenzaprine (FLEXERIL) 10 MG tablet Take 10 mg by mouth daily. 12/27/18  Yes [provider]  diclofenac sodium (VOLTAREN) 1 % GEL Apply 2 g topically as needed (for pain).  04/01/17  Yes [provider]  diltiazem (CARDIZEM CD) 360 MG 24 hr capsule Take 1 capsule (360 mg total) by mouth daily. 03/18/18  Yes Marletta Lor, MD  HYDROcodone-acetaminophen (NORCO/VICODIN) 5-325 MG tablet Take 1 tablet by mouth every 8 (eight) hours as needed for moderate pain.  09/24/17  Yes [provider]  losartan (COZAAR) 100 MG tablet Take 1 tablet (100 mg total) by mouth daily. 03/18/18  Yes Marletta Lor, MD  omeprazole (PRILOSEC OTC) 20 MG tablet Take 2 tablets (40 mg total) by mouth daily. 02/27/13  Yes Viyuoh, Adeline C, MD  sertraline (ZOLOFT) 50 MG tablet Take 1 tablet (50 mg total) by mouth daily. 03/18/18  Yes Marletta Lor, MD  vitamin B-12 (CYANOCOBALAMIN) 1000 MCG tablet Take 1,000 mcg by mouth daily.   Yes [provider]    Family History Family History  Problem Relation Age of Onset  . Bone cancer Mother   . Squamous cell carcinoma Mother        died @ 26  . Heart attack Mother   . Hypertension Father   . Diabetes Father        borderline  . Congestive Heart Failure Father        alive @ 23  . Heart failure Father   . Sudden death Brother         died @ 29  . Other Brother        died in [place] @ 8 - struck by drunk driver  . Other Brother        died in [place] @ 24 - struck by drunk driver  . Heart attack Brother   . Stroke Paternal Grandfather   . Colon cancer Neg Hx   . Esophageal cancer Neg Hx   . Rectal cancer Neg Hx   . Stomach cancer Neg Hx     Social History Social History  Tobacco Use  . Smoking status: Current Some Day Smoker    Packs/day: 0.50    Years: 40.00    Pack years: 20.00    Types: Cigarettes  . Smokeless tobacco: Never Used  . Tobacco comment: Currently smoking 1.5 ppd.  Has previously smoked up to 3 ppd and did so for about 20 yrs.  Substance Use Topics  . Alcohol use: Yes    Comment: occasional  . Drug use: No     Allergies   Lisinopril and Percodan [oxycodone-aspirin]   Review of Systems Review of Systems  Constitutional: Negative for chills and fever.  HENT: Negative for ear pain and sore throat.   Eyes: Negative for pain, discharge and visual disturbance.  Respiratory: Negative for cough, shortness of breath, wheezing and stridor.   Cardiovascular: Negative for chest pain and palpitations.  Gastrointestinal: Negative for abdominal pain and vomiting.  Musculoskeletal: Negative for arthralgias and back pain.  Skin: Negative for color change and rash.  All other systems reviewed and are negative.    Physical Exam Updated Vital Signs BP 131/90   Pulse 78   Temp 97.9 F (36.6 C) (Oral)   Resp 14   Ht 5\' 7"  (1.702 m)   Wt 80.7 kg   SpO2 97%   BMI 27.88 kg/m   Physical Exam Vitals signs and nursing note reviewed.  Constitutional:      General: He is not in acute distress.    Appearance: He is well-developed. He is not ill-appearing.  HENT:     Head: Normocephalic and atraumatic.  Eyes:     Extraocular Movements: Extraocular movements intact.     Conjunctiva/sclera: Conjunctivae normal.     Pupils: Pupils are equal, round, and reactive to light.  Neck:      Musculoskeletal: Neck supple.  Cardiovascular:     Rate and Rhythm: Regular rhythm. Tachycardia present.     Heart sounds: No murmur. No systolic murmur. No friction rub. No gallop.   Pulmonary:     Effort: Pulmonary effort is normal. No tachypnea or respiratory distress.     Breath sounds: Normal breath sounds. No decreased breath sounds or wheezing.  Chest:     Chest wall: No deformity, tenderness or edema.  Abdominal:     Palpations: Abdomen is soft.     Tenderness: There is no abdominal tenderness.  Musculoskeletal:     Right lower leg: No edema.     Left lower leg: No edema.  Skin:    General: Skin is warm and dry.  Neurological:     Mental Status: He is alert.      ED Treatments / Results  Labs (all labs ordered are listed, but only abnormal results are displayed) Labs Reviewed  BASIC METABOLIC PANEL - Abnormal; Notable for the following components:      Result Value   CO2 21 (*)    Glucose, Bld 114 (*)    Creatinine, Ser 1.37 (*)    GFR calc non Af Amer 55 (*)    All other components within normal limits  CBC  TROPONIN I (HIGH SENSITIVITY)  TROPONIN I (HIGH SENSITIVITY)    EKG EKG Interpretation  Date/Time:  Friday January 24 2019 10:15:06 EDT Ventricular Rate:  86 PR Interval:    QRS Duration: 94 QT Interval:  365 QTC Calculation: 437 R Axis:   75 Text Interpretation:  Sinus rhythm No significant change since last tracing Confirmed by Deno Etienne [phone]) on 01/24/2019 10:23:38 AM   Radiology Dg  Chest 2 View  Result Date: 01/24/2019 CLINICAL DATA:  Left chest pain EXAM: CHEST - 2 VIEW COMPARISON:  June 23, 2017 FINDINGS: The heart size and mediastinal contours are within normal limits. There is no focal infiltrate, pulmonary edema, or pleural effusion. The visualized skeletal structures are stable. IMPRESSION: No active cardiopulmonary disease. Electronically Signed   By: Abelardo Diesel M.D.   On: 01/24/2019 11:03    Procedures Procedures (including  critical care time)  Medications Ordered in ED Medications - No data to display   Initial Impression / Assessment and Plan / ED Course  I have reviewed the triage vital signs and the nursing notes.  Pertinent labs & imaging results that were available during my care of the patient were reviewed by me and considered in my medical decision making (see chart for details).  Clinical Course as of Jan 24 1228  Fri Jan 24, 2019  1038 DG Chest 2 View [SW]  1039 EKG 12-Lead [SW]  1142 Troponin I (High Sensitivity): <2 [SW]  1142 Troponin I (High Sensitivity) [SW]    Clinical Course User Index [SW] Maudie Mercury, MD   Assessment:   Cornel Werber is a 63 y/o male who presents to the ED with atypical chest pain.  Plan:  -12 lead ECG -Troponin (High-sensitivity) x2  -BMP  -DG Chest 2 view     Final Clinical Impressions(s) / ED Diagnoses   Final diagnoses:  None    ED Discharge Orders    None       Maudie Mercury, MD 01/24/19 [place], DO 01/24/19 1429

## 2019-01-24 NOTE — ED Notes (Signed)
Patient transported to X-ray 

## 2019-01-28 ENCOUNTER — Telehealth: Payer: Self-pay | Admitting: Family Medicine

## 2019-01-28 NOTE — Telephone Encounter (Signed)
Tommi Rumps, do you need a virtual visit?

## 2019-01-28 NOTE — Telephone Encounter (Signed)
Copied from [place] [id number]. Topic: General - Other >> Jan 27, 2019 10:16 AM Leward Quan A wrote: Reason for CRM: Patient wife called to say that he was taken to the ED by ambulance on 01/24/2019 due to chest pain and elevated BP. She states that she have him resting because there is nothing wrong with his heart. She states that she does not feel that he need to be seen but just wanted to inform Dr Tommi Rumps.  Ph# [phone]

## 2019-01-28 NOTE — Telephone Encounter (Signed)
Noted  

## 2019-01-28 NOTE — Telephone Encounter (Signed)
It doesn't look like it. If he experiences any additional chest pain then have him follow up

## 2019-03-24 DIAGNOSIS — L249 Irritant contact dermatitis, unspecified cause: Secondary | ICD-10-CM | POA: Diagnosis not present

## 2019-03-24 DIAGNOSIS — D229 Melanocytic nevi, unspecified: Secondary | ICD-10-CM | POA: Diagnosis not present

## 2019-03-24 DIAGNOSIS — L821 Other seborrheic keratosis: Secondary | ICD-10-CM | POA: Diagnosis not present

## 2019-03-24 DIAGNOSIS — L570 Actinic keratosis: Secondary | ICD-10-CM | POA: Diagnosis not present

## 2019-03-24 DIAGNOSIS — L814 Other melanin hyperpigmentation: Secondary | ICD-10-CM | POA: Diagnosis not present

## 2019-03-24 DIAGNOSIS — L819 Disorder of pigmentation, unspecified: Secondary | ICD-10-CM | POA: Diagnosis not present

## 2019-03-26 DIAGNOSIS — M1A09X0 Idiopathic chronic gout, multiple sites, without tophus (tophi): Secondary | ICD-10-CM | POA: Diagnosis not present

## 2019-03-26 DIAGNOSIS — N183 Chronic kidney disease, stage 3 (moderate): Secondary | ICD-10-CM | POA: Diagnosis not present

## 2019-03-26 DIAGNOSIS — M150 Primary generalized (osteo)arthritis: Secondary | ICD-10-CM | POA: Diagnosis not present

## 2019-03-26 DIAGNOSIS — M5030 Other cervical disc degeneration, unspecified cervical region: Secondary | ICD-10-CM | POA: Diagnosis not present

## 2019-03-26 DIAGNOSIS — Z6824 Body mass index (BMI) 24.0-24.9, adult: Secondary | ICD-10-CM | POA: Diagnosis not present

## 2019-04-09 ENCOUNTER — Other Ambulatory Visit: Payer: Self-pay | Admitting: Adult Health

## 2019-04-10 ENCOUNTER — Other Ambulatory Visit: Payer: Self-pay | Admitting: Adult Health

## 2019-04-12 ENCOUNTER — Ambulatory Visit (HOSPITAL_COMMUNITY)
Admission: EM | Admit: 2019-04-12 | Discharge: 2019-04-12 | Disposition: A | Discharge status: Home / Self Care | Payer: 59 | Attending: Emergency Medicine | Admitting: Emergency Medicine

## 2019-04-12 ENCOUNTER — Encounter (HOSPITAL_COMMUNITY): Payer: Self-pay

## 2019-04-12 ENCOUNTER — Ambulatory Visit (INDEPENDENT_AMBULATORY_CARE_PROVIDER_SITE_OTHER): Payer: 59

## 2019-04-12 ENCOUNTER — Other Ambulatory Visit: Payer: Self-pay

## 2019-04-12 DIAGNOSIS — R490 Dysphonia: Secondary | ICD-10-CM

## 2019-04-12 DIAGNOSIS — R059 Cough, unspecified: Secondary | ICD-10-CM

## 2019-04-12 DIAGNOSIS — R05 Cough: Secondary | ICD-10-CM | POA: Diagnosis not present

## 2019-04-12 MED ORDER — ALBUTEROL SULFATE HFA 108 (90 BASE) MCG/ACT IN AERS: 2.0000 | INHALATION_SPRAY | Freq: Four times a day (QID) | RESPIRATORY_TRACT | 0 refills | Status: DC | PRN

## 2019-04-12 MED ORDER — PREDNISONE 20 MG PO TABS: 40.0000 mg | ORAL_TABLET | Freq: Every day | ORAL | 0 refills | Status: AC

## 2019-04-12 NOTE — Discharge Instructions (Signed)
Your xray remains normal today which is reassuring. I have refilled your inhaler to use for any wheezing or chest tightness.  We will try a 5 day course of a steroid to see if this is helpful with your hoarseness and/or cough.  Continue to take your twice a day omeprazole.  It is appropriate to follow up with ENT For your hoarseness, especially with your smoking history, as further evaluation may be appropriate to determine source of symptoms.  Continue to follow with your primary care provider as needed for symptoms.  Return to be seen or go to the ER for any worsening of cough, shortness of breath , chest pain .

## 2019-04-12 NOTE — ED Triage Notes (Signed)
Pt presents with complaints of cough x 2 months. States it has gotten worse over the last couple days. He had a bad coughing spell this morning that scared him. Patient is content during triage.

## 2019-04-12 NOTE — ED Provider Notes (Signed)
[facility]    CSN: [id number] Arrival date & time: 04/12/19  1020      History   Chief Complaint Chief Complaint  Patient presents with  . Cough    HPI Aaron Black is a 63 y.o. male.   Aaron Black presents with complaints of cough and hoarse voice. Episode this morning of cough which was severe. When he first wakes hoarseness is worse. Has had a cough for two months. Dry cough. Hoarseness for 2 months as well. Thought it was allergies, doesn't take allergy medications. No nasal drainage. No shortness of breath . No chest pain . No throat pain. No ear pain. No fevers. No preveious similar. No asthma or COPD. Takes two reflux medxications a day, it does seem to help. Takes this regularly. Has tried calling ENT office last week but unable to get through. Smokes <1ppd, no chewing tobacco products. History  Of anemia, anxiety, cellulitis, chest pain , low back pain, depression, gerd, gout, htn, pleurisy, shortness of breath .     ROS per HPI, negative if not otherwise mentioned.      Past Medical History:  Diagnosis Date  . Anemia   . Anxiety   . Arthritis   . B12 deficiency   . Cellulitis of arm, left 10/2017   after cat scratch  . Cervical disc disease    a. 07/2003 ant cervical deomcpression and fusion C5-6/C6-7;  b. 09/2007 post cervical laminectomy C4-5 with scres and arthrodesis C4-C7.  Marland Kitchen Chest pain at rest 02/25/2013  . Chronic lower back pain   . Complication of anesthesia    " i WOKE UP DURING A COLONOSCOPY "- 2008  . Depression   . Diverticulosis of colon   . GERD (gastroesophageal reflux disease)   . Gout   . Hyperlipidemia   . Hypertension   . Pleurisy   . Shortness of breath dyspnea    with exertion- "I cant exercise now"  . Tobacco abuse    a. 40 yrs, 1.5-3 ppd over that time.    Patient Active Problem List   Diagnosis Date Noted  . Encounter for tobacco use cessation counseling 11/01/2017  . Pseudoarthrosis of lumbar spine  01/21/2016  . Lumbar spine scoliosis 06/05/2014  . Herniated lumbar disc without myelopathy 07/11/2013  . Tobacco abuse 09/25/2011  . Gout 05/27/2009  . VITAMIN B12 DEFICIENCY 09/08/2008  . HLD (hyperlipidemia) 09/08/2008  . COLONIC POLYPS, HX OF 09/08/2008  . Anemia, unspecified 06/30/2008  . Anxiety disorder 06/29/2008  . Essential hypertension 06/29/2008  . GERD 06/29/2008  . Diverticulosis of colon 06/29/2008  . ARTHRITIS 06/29/2008  . HERNIATED DISC 06/29/2008    Past Surgical History:  Procedure Laterality Date  . ANTERIOR LATERAL LUMBAR FUSION 4 LEVELS Right 06/05/2014   Procedure: ANTERIOR LATERAL LUMBAR FUSION  LUMBAR ONE TO LUMBAR FIVE with Percutaneous Pedicle Screws;  Surgeon: Erline Levine, MD;  Location: [place] [facility];  Service: Neurosurgery;  Laterality: Right;  . CARPAL TUNNEL RELEASE Right   . CERVICAL DISC SURGERY     X 2  . COLONOSCOPY    . ELBOW ARTHROSCOPY Right   . KNEE ARTHROSCOPY Right 08/2013   torn menicus  . KNEE ARTHROSCOPY Left 11/2013   chip cartlidge,torn menicus  . LUMBAR LAMINECTOMY/DECOMPRESSION MICRODISCECTOMY Right 07/11/2013   Procedure: LUMBAR LAMINECTOMY/DECOMPRESSION MICRODISCECTOMY 1 LEVEL;  Surgeon: Erline Levine, MD;  Location: [place] [facility];  Service: Neurosurgery;  Laterality: Right;  Right L34 microdiskectomy  . LUMBAR PERCUTANEOUS PEDICLE SCREW 4  LEVEL N/A 06/05/2014   Procedure: LUMBAR PERCUTANEOUS PEDICLE SCREW 4 LEVEL;  Surgeon: Erline Levine, MD;  Location: [place] [facility];  Service: Neurosurgery;  Laterality: N/A;  . NECK SURGERY  January 2005, March 2009   C-Spine  . ROTATOR CUFF REPAIR  August 2000, January 2002, July 2008, July 2009   2 left 2 right       Home Medications    Prior to Admission medications   Medication Sig Start Date End Date Taking? Authorizing Provider  albuterol (VENTOLIN HFA) 108 (90 Base) MCG/ACT inhaler Inhale 2 puffs into the lungs every 6 (six) hours as needed for wheezing or shortness of breath.  04/12/19   Zigmund Gottron, NP  allopurinol (ZYLOPRIM) 100 MG tablet TAKE 1 TABLET BY MOUTH EVERY DAY Patient taking differently: Take 100 mg by mouth daily.  10/15/18   Nafziger, Tommi Rumps, NP  ALPRAZolam (XANAX) 0.5 MG tablet TAKE 1 TABLET BY MOUTH THREE TIMES A DAY AS NEEDED FOR ANXIETY Patient taking differently: Take 0.5 mg by mouth 2 (two) times daily.  01/21/19   Nafziger, Tommi Rumps, NP  atorvastatin (LIPITOR) 40 MG tablet Take 1 tablet (40 mg total) by mouth daily. 09/19/18   Nafziger, Tommi Rumps, NP  colchicine 0.6 MG tablet TAKE 1 TABLET (0.6 MG TOTAL) BY MOUTH DAILY AS NEEDED (GOUT). 03/20/18   Marletta Lor, MD  cyclobenzaprine (FLEXERIL) 10 MG tablet Take 10 mg by mouth daily. 12/27/18   [provider]  diclofenac sodium (VOLTAREN) 1 % GEL Apply 2 g topically as needed (for pain).  04/01/17   [provider]  diltiazem (CARDIZEM CD) 360 MG 24 hr capsule Take 1 capsule (360 mg total) by mouth daily. 03/18/18   Marletta Lor, MD  HYDROcodone-acetaminophen (NORCO/VICODIN) 5-325 MG tablet Take 1 tablet by mouth every 8 (eight) hours as needed for moderate pain.  09/24/17   [provider]  losartan (COZAAR) 100 MG tablet Take 1 tablet (100 mg total) by mouth daily. 03/18/18   Marletta Lor, MD  omeprazole (PRILOSEC OTC) 20 MG tablet Take 2 tablets (40 mg total) by mouth daily. 02/27/13   Viyuoh, Alison Stalling, MD  predniSONE (DELTASONE) 20 MG tablet Take 2 tablets (40 mg total) by mouth daily with breakfast for 5 days. 04/12/19 04/17/19  Zigmund Gottron, NP  sertraline (ZOLOFT) 50 MG tablet Take 1 tablet (50 mg total) by mouth daily. 03/18/18   Marletta Lor, MD  vitamin B-12 (CYANOCOBALAMIN) 1000 MCG tablet Take 1,000 mcg by mouth daily.    [provider]    Family History Family History  Problem Relation Age of Onset  . Bone cancer Mother   . Squamous cell carcinoma Mother        died @ 40  . Heart attack Mother   . Hypertension Father   . Diabetes  Father        borderline  . Congestive Heart Failure Father        alive @ 63  . Heart failure Father   . Sudden death Brother        died @ 31  . Other Brother        died in [place] @ 8 - struck by drunk driver  . Other Brother        died in [place] @ 8 - struck by drunk driver  . Heart attack Brother   . Stroke Paternal Grandfather   . Colon cancer Neg Hx   . Esophageal cancer Neg  Hx   . Rectal cancer Neg Hx   . Stomach cancer Neg Hx     Social History Social History   Tobacco Use  . Smoking status: Current Some Day Smoker    Packs/day: 0.50    Years: 40.00    Pack years: 20.00    Types: Cigarettes  . Smokeless tobacco: Never Used  . Tobacco comment: Currently smoking 1.5 ppd.  Has previously smoked up to 3 ppd and did so for about 20 yrs.  Substance Use Topics  . Alcohol use: Yes    Comment: occasional  . Drug use: No     Allergies   Lisinopril and Percodan [oxycodone-aspirin]   Review of Systems Review of Systems   Physical Exam Triage Vital Signs ED Triage Vitals [04/12/19 1041]  Enc Vitals Group     BP (!) 149/98     Pulse Rate 81     Resp 18     Temp 98 F (36.7 C)     Temp src      SpO2 100 %     Weight      Height      Head Circumference      Peak Flow      Pain Score 0     Pain Loc      Pain Edu?      Excl. in [place]?    No data found.  Updated Vital Signs BP (!) 149/98   Pulse 81   Temp 98 F (36.7 C)   Resp 18   SpO2 100%    Physical Exam Constitutional:      Appearance: He is well-developed.  HENT:     Mouth/Throat:     Mouth: Mucous membranes are dry.  Cardiovascular:     Rate and Rhythm: Normal rate and regular rhythm.  Pulmonary:     Effort: Pulmonary effort is normal.     Breath sounds: Normal breath sounds.     Comments: Voice hoarseness noted; no cough throughout exam  Skin:    General: Skin is warm and dry.  Neurological:     Mental Status: He is alert and oriented to person, place, and time.      UC  Treatments / Results  Labs (all labs ordered are listed, but only abnormal results are displayed) Labs Reviewed - No data to display  EKG   Radiology Dg Chest 2 View  Result Date: 04/12/2019 CLINICAL DATA:  Cough x2 months EXAM: CHEST - 2 VIEW COMPARISON:  01/24/2019 FINDINGS: Lungs are clear.  No pleural effusion or pneumothorax. The heart is normal in size. Degenerative changes of the lower thoracic spine with lumbar spine fixation hardware. Cervical spine fixation hardware, incompletely visualized. IMPRESSION: Normal chest radiographs. Electronically Signed   By: Julian Hy M.D.   On: 04/12/2019 11:32    Procedures Procedures (including critical care time)  Medications Ordered in UC Medications - No data to display  Initial Impression / Assessment and Plan / UC Course  I have reviewed the triage vital signs and the nursing notes.  Pertinent labs & imaging results that were available during my care of the patient were reviewed by me and considered in my medical decision making (see chart for details).     Chest xray reassuring. This sounds acute on chronic. Chronic hoarse voice which has not been evaluated in this long standing smoker. Course of prednisone provided but emphasized significance of following up with ENT for further evaluation of this. Continue with gerd  treatment as prescribed already. Return precautions provided. Patient verbalized understanding and agreeable to plan.   Final Clinical Impressions(s) / UC Diagnoses   Final diagnoses:  Cough  Voice hoarseness     Discharge Instructions     Your xray remains normal today which is reassuring. I have refilled your inhaler to use for any wheezing or chest tightness.  We will try a 5 day course of a steroid to see if this is helpful with your hoarseness and/or cough.  Continue to take your twice a day omeprazole.  It is appropriate to follow up with ENT For your hoarseness, especially with your smoking  history, as further evaluation may be appropriate to determine source of symptoms.  Continue to follow with your primary care provider as needed for symptoms.  Return to be seen or go to the ER for any worsening of cough, shortness of breath , chest pain .     ED Prescriptions    Medication Sig Dispense Auth. Provider   albuterol (VENTOLIN HFA) 108 (90 Base) MCG/ACT inhaler Inhale 2 puffs into the lungs every 6 (six) hours as needed for wheezing or shortness of breath. 8 g Augusto Gamble B, NP   predniSONE (DELTASONE) 20 MG tablet Take 2 tablets (40 mg total) by mouth daily with breakfast for 5 days. 10 tablet Zigmund Gottron, NP     PDMP not reviewed this encounter.   Zigmund Gottron, NP 04/12/19 1327

## 2019-04-15 NOTE — Telephone Encounter (Signed)
Sent to the pharmacy by e-scribe. 

## 2019-04-15 NOTE — Telephone Encounter (Signed)
Pt calling to f/up on this request.  Pt states he is out of medication.  Please refill asap.

## 2019-04-15 NOTE — Telephone Encounter (Signed)
Pt called to f/up on request.  Pt states he is out of medication.  Please refill asap.

## 2019-05-07 ENCOUNTER — Encounter (HOSPITAL_BASED_OUTPATIENT_CLINIC_OR_DEPARTMENT_OTHER): Payer: Self-pay | Admitting: *Deleted

## 2019-05-07 ENCOUNTER — Ambulatory Visit: Payer: Self-pay | Admitting: Otolaryngology

## 2019-05-07 DIAGNOSIS — R490 Dysphonia: Secondary | ICD-10-CM | POA: Diagnosis not present

## 2019-05-07 NOTE — H&P (Signed)
PREOPERATIVE H&P  Chief Complaint: Hoarseness  HPI: Aaron Black is a 63 y.o. male who presents for evaluation of hoarseness for the past 4 months.  He denies any sore throat or trouble swallowing.  He smokes approximately a pack a day.  On examination the office patient has a lesion on the true vocal cord extending up to the anterior commissure more on the left side.  He is taken the operating room at this time for microlaryngoscopy and biopsy.  Past Medical History:  Diagnosis Date  . Anemia   . Anxiety   . Arthritis   . B12 deficiency   . Cellulitis of arm, left 10/2017   after cat scratch  . Cervical disc disease    a. 07/2003 ant cervical deomcpression and fusion C5-6/C6-7;  b. 09/2007 post cervical laminectomy C4-5 with scres and arthrodesis C4-C7.  Marland Kitchen Chest pain at rest 02/25/2013  . Chronic lower back pain   . Complication of anesthesia    " i WOKE UP DURING A COLONOSCOPY "- 2008  . Depression   . Diverticulosis of colon   . GERD (gastroesophageal reflux disease)   . Gout   . Hyperlipidemia   . Hypertension   . Pleurisy   . Shortness of breath dyspnea    with exertion- "I cant exercise now"  . Tobacco abuse    a. 40 yrs, 1.5-3 ppd over that time.   Past Surgical History:  Procedure Laterality Date  . ANTERIOR LATERAL LUMBAR FUSION 4 LEVELS Right 06/05/2014   Procedure: ANTERIOR LATERAL LUMBAR FUSION  LUMBAR ONE TO LUMBAR FIVE with Percutaneous Pedicle Screws;  Surgeon: Erline Levine, MD;  Location: [place];  Service: Neurosurgery;  Laterality: Right;  . CARPAL TUNNEL RELEASE Right   . CERVICAL DISC SURGERY     X 2  . COLONOSCOPY    . ELBOW ARTHROSCOPY Right   . KNEE ARTHROSCOPY Right 08/2013   torn menicus  . KNEE ARTHROSCOPY Left 11/2013   chip cartlidge,torn menicus  . LUMBAR LAMINECTOMY/DECOMPRESSION MICRODISCECTOMY Right 07/11/2013   Procedure: LUMBAR LAMINECTOMY/DECOMPRESSION MICRODISCECTOMY 1 LEVEL;  Surgeon: Erline Levine, MD;  Location: [place];   Service: Neurosurgery;  Laterality: Right;  Right L34 microdiskectomy  . LUMBAR PERCUTANEOUS PEDICLE SCREW 4 LEVEL N/A 06/05/2014   Procedure: LUMBAR PERCUTANEOUS PEDICLE SCREW 4 LEVEL;  Surgeon: Erline Levine, MD;  Location: [place] [facility];  Service: Neurosurgery;  Laterality: N/A;  . NECK SURGERY  January 2005, March 2009   C-Spine  . ROTATOR CUFF REPAIR  August 2000, January 2002, July 2008, July 2009   2 left 2 right   Social History   Socioeconomic History  . Marital status: Married    Spouse name: Not on file  . Number of children: Not on file  . Years of education: Not on file  . Highest education level: Not on file  Occupational History  . Not on file  Social Needs  . Financial resource strain: Not on file  . Food insecurity    Worry: Not on file    Inability: Not on file  . Transportation needs    Medical: Not on file    Non-medical: Not on file  Tobacco Use  . Smoking status: Current Some Day Smoker    Packs/day: 0.50    Years: 40.00    Pack years: 20.00    Types: Cigarettes  . Smokeless tobacco: Never Used  . Tobacco comment: Currently smoking 1.5 ppd.  Has previously smoked up to 3 ppd  and did so for about 20 yrs.  Substance and Sexual Activity  . Alcohol use: Yes    Comment: occasional  . Drug use: No  . Sexual activity: Not on file  Lifestyle  . Physical activity    Days per week: Not on file    Minutes per session: Not on file  . Stress: Not on file  Relationships  . Social Herbalist on phone: Not on file    Gets together: Not on file    Attends religious service: Not on file    Active member of club or organization: Not on file    Attends meetings of clubs or organizations: Not on file    Relationship status: Not on file  Other Topics Concern  . Not on file  Social History Narrative   Lives in [place] with wife.  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   Family History  Problem Relation Age of Onset  . Bone  cancer Mother   . Squamous cell carcinoma Mother        died @ 40  . Heart attack Mother   . Hypertension Father   . Diabetes Father        borderline  . Congestive Heart Failure Father        alive @ 59  . Heart failure Father   . Sudden death Brother        died @ 66  . Other Brother        died in [place] @ 37 - struck by drunk driver  . Other Brother        died in [place] @ 29 - struck by drunk driver  . Heart attack Brother   . Stroke Paternal Grandfather   . Colon cancer Neg Hx   . Esophageal cancer Neg Hx   . Rectal cancer Neg Hx   . Stomach cancer Neg Hx    Allergies  Allergen Reactions  . Lisinopril Swelling    Swollen Lip  . Percodan [Oxycodone-Aspirin] Rash    Percodan caused rash---but he can tolerate Percocet and aspirin on their own    Prior to Admission medications   Medication Sig Start Date End Date Taking? Authorizing Provider  albuterol (VENTOLIN HFA) 108 (90 Base) MCG/ACT inhaler Inhale 2 puffs into the lungs every 6 (six) hours as needed for wheezing or shortness of breath. 04/12/19  Yes Burky, Lanelle Bal B, NP  allopurinol (ZYLOPRIM) 100 MG tablet TAKE 1 TABLET BY MOUTH EVERY DAY 04/15/19  Yes Nafziger, Tommi Rumps, NP  ALPRAZolam (XANAX) 0.5 MG tablet TAKE 1 TABLET BY MOUTH THREE TIMES A DAY AS NEEDED FOR ANXIETY 04/15/19  Yes Nafziger, Tommi Rumps, NP  atorvastatin (LIPITOR) 40 MG tablet Take 1 tablet (40 mg total) by mouth daily. 09/19/18  Yes Nafziger, Tommi Rumps, NP  cyclobenzaprine (FLEXERIL) 10 MG tablet Take 10 mg by mouth daily. 12/27/18  Yes [provider]  diltiazem (CARDIZEM CD) 360 MG 24 hr capsule Take 1 capsule (360 mg total) by mouth daily. 03/18/18  Yes Marletta Lor, MD  HYDROcodone-acetaminophen (NORCO/VICODIN) 5-325 MG tablet Take 1 tablet by mouth every 8 (eight) hours as needed for moderate pain.  09/24/17  Yes [provider]  losartan (COZAAR) 100 MG tablet Take 1 tablet (100 mg total) by mouth daily. 03/18/18  Yes Marletta Lor, MD   omeprazole (PRILOSEC OTC) 20 MG tablet Take 2 tablets (40 mg total) by mouth daily. 02/27/13  Yes  Viyuoh, Adeline C, MD  sertraline (ZOLOFT) 50 MG tablet Take 1 tablet (50 mg total) by mouth daily. 03/18/18  Yes Marletta Lor, MD  vitamin B-12 (CYANOCOBALAMIN) 1000 MCG tablet Take 1,000 mcg by mouth daily.   Yes [provider]  colchicine 0.6 MG tablet TAKE 1 TABLET (0.6 MG TOTAL) BY MOUTH DAILY AS NEEDED (GOUT). 03/20/18   Marletta Lor, MD  diclofenac sodium (VOLTAREN) 1 % GEL Apply 2 g topically as needed (for pain).  04/01/17   [provider]     Positive ROS: Negative except for hoarseness  All other systems have been reviewed and were otherwise negative with the exception of those mentioned in the HPI and as above.  Physical Exam: There were no vitals filed for this visit.  General: Alert, no acute distress Oral: Normal oral mucosa and tonsils Nasal: Clear nasal passages.  Septal deviation to the left.  Laryngoscopy revealed anterior left vocal cord lesion at that extends to the anterior commissure.  Vocal cords have symmetric mobility Neck: No palpable adenopathy or thyroid nodules Ear: Ear canal is clear with normal appearing TMs Cardiovascular: Regular rate and rhythm, no murmur.  Respiratory: Clear to auscultation Neurologic: Alert and oriented x 3   Assessment/Plan: HOARSENESS Plan for Procedure(s): MICROLARYNGOSCOPY WITH BIOPSY   [name], MD 05/07/2019 1:00 PM

## 2019-05-08 ENCOUNTER — Other Ambulatory Visit (HOSPITAL_COMMUNITY)
Admission: RE | Admit: 2019-05-08 | Discharge: 2019-05-08 | Disposition: A | Discharge status: Home / Self Care | Payer: 59 | Source: Ambulatory Visit | Attending: Otolaryngology | Admitting: Otolaryngology

## 2019-05-08 DIAGNOSIS — Z01812 Encounter for preprocedural laboratory examination: Secondary | ICD-10-CM | POA: Insufficient documentation

## 2019-05-08 DIAGNOSIS — Z20828 Contact with and (suspected) exposure to other viral communicable diseases: Secondary | ICD-10-CM | POA: Insufficient documentation

## 2019-05-09 ENCOUNTER — Encounter (HOSPITAL_BASED_OUTPATIENT_CLINIC_OR_DEPARTMENT_OTHER): Payer: Self-pay

## 2019-05-10 LAB — NOVEL CORONAVIRUS, NAA (HOSP ORDER, SEND-OUT TO REF LAB; TAT 18-24 HRS): SARS-CoV-2, NAA: NOT DETECTED

## 2019-05-12 ENCOUNTER — Encounter (HOSPITAL_BASED_OUTPATIENT_CLINIC_OR_DEPARTMENT_OTHER): Payer: Self-pay | Admitting: Anesthesiology

## 2019-05-12 ENCOUNTER — Ambulatory Visit (HOSPITAL_BASED_OUTPATIENT_CLINIC_OR_DEPARTMENT_OTHER)
Admission: RE | Admit: 2019-05-12 | Discharge: 2019-05-12 | Disposition: A | Discharge status: Home / Self Care | Payer: 59 | Attending: Otolaryngology | Admitting: Otolaryngology

## 2019-05-12 ENCOUNTER — Ambulatory Visit (HOSPITAL_BASED_OUTPATIENT_CLINIC_OR_DEPARTMENT_OTHER): Payer: 59 | Admitting: Anesthesiology

## 2019-05-12 ENCOUNTER — Other Ambulatory Visit: Payer: Self-pay

## 2019-05-12 ENCOUNTER — Encounter (HOSPITAL_BASED_OUTPATIENT_CLINIC_OR_DEPARTMENT_OTHER)
Admission: RE | Disposition: A | Discharge status: Home / Self Care | Payer: Self-pay | Source: Home / Self Care | Attending: Otolaryngology

## 2019-05-12 DIAGNOSIS — M1990 Unspecified osteoarthritis, unspecified site: Secondary | ICD-10-CM | POA: Insufficient documentation

## 2019-05-12 DIAGNOSIS — M109 Gout, unspecified: Secondary | ICD-10-CM | POA: Insufficient documentation

## 2019-05-12 DIAGNOSIS — E785 Hyperlipidemia, unspecified: Secondary | ICD-10-CM | POA: Diagnosis not present

## 2019-05-12 DIAGNOSIS — D649 Anemia, unspecified: Secondary | ICD-10-CM | POA: Diagnosis not present

## 2019-05-12 DIAGNOSIS — F419 Anxiety disorder, unspecified: Secondary | ICD-10-CM | POA: Insufficient documentation

## 2019-05-12 DIAGNOSIS — I10 Essential (primary) hypertension: Secondary | ICD-10-CM | POA: Insufficient documentation

## 2019-05-12 DIAGNOSIS — F329 Major depressive disorder, single episode, unspecified: Secondary | ICD-10-CM | POA: Diagnosis not present

## 2019-05-12 DIAGNOSIS — Z79899 Other long term (current) drug therapy: Secondary | ICD-10-CM | POA: Diagnosis not present

## 2019-05-12 DIAGNOSIS — F17210 Nicotine dependence, cigarettes, uncomplicated: Secondary | ICD-10-CM | POA: Diagnosis not present

## 2019-05-12 DIAGNOSIS — C320 Malignant neoplasm of glottis: Secondary | ICD-10-CM | POA: Insufficient documentation

## 2019-05-12 DIAGNOSIS — K219 Gastro-esophageal reflux disease without esophagitis: Secondary | ICD-10-CM | POA: Diagnosis not present

## 2019-05-12 DIAGNOSIS — R490 Dysphonia: Secondary | ICD-10-CM | POA: Insufficient documentation

## 2019-05-12 DIAGNOSIS — J383 Other diseases of vocal cords: Secondary | ICD-10-CM | POA: Diagnosis not present

## 2019-05-12 DIAGNOSIS — R69 Illness, unspecified: Secondary | ICD-10-CM | POA: Diagnosis not present

## 2019-05-12 HISTORY — PX: MICROLARYNGOSCOPY: SHX5208

## 2019-05-12 SURGERY — MICROLARYNGOSCOPY
Anesthesia: General | Site: Throat | Laterality: Left

## 2019-05-12 MED ORDER — LACTATED RINGERS IV SOLN
INTRAVENOUS | Status: DC
  Administered 2019-05-12: 11:00:00 via INTRAVENOUS

## 2019-05-12 MED ORDER — DEXAMETHASONE SODIUM PHOSPHATE 10 MG/ML IJ SOLN
INTRAMUSCULAR | Status: AC
  Filled 2019-05-12: qty 1

## 2019-05-12 MED ORDER — MIDAZOLAM HCL 5 MG/5ML IJ SOLN
INTRAMUSCULAR | Status: DC | PRN
  Administered 2019-05-12: 2 mg via INTRAVENOUS

## 2019-05-12 MED ORDER — SUGAMMADEX SODIUM 200 MG/2ML IV SOLN
INTRAVENOUS | Status: DC | PRN
  Administered 2019-05-12: 200 mg via INTRAVENOUS

## 2019-05-12 MED ORDER — EPINEPHRINE PF 1 MG/ML IJ SOLN
INTRAMUSCULAR | Status: AC
  Filled 2019-05-12: qty 4

## 2019-05-12 MED ORDER — ACETAMINOPHEN 500 MG PO TABS
ORAL_TABLET | ORAL | Status: AC
  Filled 2019-05-12: qty 2

## 2019-05-12 MED ORDER — PROPOFOL 500 MG/50ML IV EMUL
INTRAVENOUS | Status: DC | PRN
  Administered 2019-05-12: 25 ug/kg/min via INTRAVENOUS

## 2019-05-12 MED ORDER — PROMETHAZINE HCL 25 MG/ML IJ SOLN: 6.2500 mg | INTRAMUSCULAR | Status: DC | PRN

## 2019-05-12 MED ORDER — LIDOCAINE 2% (20 MG/ML) 5 ML SYRINGE
INTRAMUSCULAR | Status: AC
  Filled 2019-05-12: qty 5

## 2019-05-12 MED ORDER — PROPOFOL 10 MG/ML IV BOLUS
INTRAVENOUS | Status: DC | PRN
  Administered 2019-05-12: 180 mg via INTRAVENOUS
  Administered 2019-05-12: 2 mg via INTRAVENOUS

## 2019-05-12 MED ORDER — FENTANYL CITRATE (PF) 100 MCG/2ML IJ SOLN
INTRAMUSCULAR | Status: AC
  Filled 2019-05-12: qty 2

## 2019-05-12 MED ORDER — ROCURONIUM BROMIDE 100 MG/10ML IV SOLN
INTRAVENOUS | Status: DC | PRN
  Administered 2019-05-12: 40 mg via INTRAVENOUS

## 2019-05-12 MED ORDER — ONDANSETRON HCL 4 MG/2ML IJ SOLN
INTRAMUSCULAR | Status: DC | PRN
  Administered 2019-05-12: 4 mg via INTRAVENOUS

## 2019-05-12 MED ORDER — CEFAZOLIN SODIUM-DEXTROSE 2-4 GM/100ML-% IV SOLN
INTRAVENOUS | Status: AC
  Filled 2019-05-12: qty 100

## 2019-05-12 MED ORDER — EPINEPHRINE PF 1 MG/ML IJ SOLN
INTRAMUSCULAR | Status: DC | PRN
  Administered 2019-05-12: 1 mg

## 2019-05-12 MED ORDER — ROCURONIUM BROMIDE 10 MG/ML (PF) SYRINGE
PREFILLED_SYRINGE | INTRAVENOUS | Status: AC
  Filled 2019-05-12: qty 10

## 2019-05-12 MED ORDER — DEXAMETHASONE SODIUM PHOSPHATE 4 MG/ML IJ SOLN
INTRAMUSCULAR | Status: DC | PRN
  Administered 2019-05-12: 5 mg via INTRAVENOUS

## 2019-05-12 MED ORDER — MIDAZOLAM HCL 2 MG/2ML IJ SOLN
INTRAMUSCULAR | Status: AC
  Filled 2019-05-12: qty 2

## 2019-05-12 MED ORDER — ONDANSETRON HCL 4 MG/2ML IJ SOLN
INTRAMUSCULAR | Status: AC
  Filled 2019-05-12: qty 2

## 2019-05-12 MED ORDER — CEFAZOLIN SODIUM-DEXTROSE 2-4 GM/100ML-% IV SOLN
2.0000 g | INTRAVENOUS | Status: AC
  Administered 2019-05-12: 2 g via INTRAVENOUS

## 2019-05-12 MED ORDER — LIDOCAINE HCL (CARDIAC) PF 100 MG/5ML IV SOSY
PREFILLED_SYRINGE | INTRAVENOUS | Status: DC | PRN
  Administered 2019-05-12: 100 mg via INTRAVENOUS

## 2019-05-12 MED ORDER — PROPOFOL 10 MG/ML IV BOLUS
INTRAVENOUS | Status: AC
  Filled 2019-05-12: qty 20

## 2019-05-12 MED ORDER — FENTANYL CITRATE (PF) 100 MCG/2ML IJ SOLN
INTRAMUSCULAR | Status: DC | PRN
  Administered 2019-05-12: 50 ug via INTRAVENOUS
  Administered 2019-05-12: 100 ug via INTRAVENOUS

## 2019-05-12 MED ORDER — FENTANYL CITRATE (PF) 100 MCG/2ML IJ SOLN: 25.0000 ug | INTRAMUSCULAR | Status: DC | PRN

## 2019-05-12 MED ORDER — ACETAMINOPHEN 500 MG PO TABS
1000.0000 mg | ORAL_TABLET | Freq: Once | ORAL | Status: AC
  Administered 2019-05-12: 1000 mg via ORAL

## 2019-05-12 SURGICAL SUPPLY — 29 items
CANISTER SUCT 1200ML W/VALVE (MISCELLANEOUS) ×3 IMPLANT
CONT SPEC 4OZ CLIKSEAL STRL BL (MISCELLANEOUS) IMPLANT
COVER WAND RF STERILE (DRAPES) IMPLANT
DRAPE HALF SHEET 70X43 (DRAPES) ×3 IMPLANT
GAUZE SPONGE 4X4 12PLY STRL LF (GAUZE/BANDAGES/DRESSINGS) ×6 IMPLANT
GLOVE SS BIOGEL STRL SZ 7.5 (GLOVE) ×1 IMPLANT
GLOVE SUPERSENSE BIOGEL SZ 7.5 (GLOVE) ×2
GOWN STRL REUS W/ TWL LRG LVL3 (GOWN DISPOSABLE) IMPLANT
GOWN STRL REUS W/ TWL XL LVL3 (GOWN DISPOSABLE) IMPLANT
GOWN STRL REUS W/TWL LRG LVL3 (GOWN DISPOSABLE)
GOWN STRL REUS W/TWL XL LVL3 (GOWN DISPOSABLE)
GUARD TEETH (MISCELLANEOUS) IMPLANT
MARKER SKIN DUAL TIP RULER LAB (MISCELLANEOUS) IMPLANT
NDL SAFETY ECLIPSE 18X1.5 (NEEDLE) ×1 IMPLANT
NDL SPNL 22GX7 QUINCKE BK (NEEDLE) IMPLANT
NEEDLE HYPO 18GX1.5 SHARP (NEEDLE) ×3
NEEDLE SPNL 22GX7 QUINCKE BK (NEEDLE) IMPLANT
NS IRRIG 1000ML POUR BTL (IV SOLUTION) ×3 IMPLANT
PACK BASIN DAY SURGERY FS (CUSTOM PROCEDURE TRAY) ×3 IMPLANT
PAD ALCOHOL SWAB (MISCELLANEOUS) ×3 IMPLANT
PATTIES SURGICAL .5 X3 (DISPOSABLE) ×3 IMPLANT
SLEEVE SCD COMPRESS KNEE MED (MISCELLANEOUS) ×3 IMPLANT
SOL ANTI FOG 6CC (MISCELLANEOUS) IMPLANT
SOLUTION ANTI FOG 6CC (MISCELLANEOUS)
SOLUTION BUTLER CLEAR DIP (MISCELLANEOUS) ×3 IMPLANT
SYR CONTROL 10ML LL (SYRINGE) IMPLANT
TOWEL GREEN STERILE FF (TOWEL DISPOSABLE) ×3 IMPLANT
TUBE CONNECTING 20'X1/4 (TUBING) ×1
TUBE CONNECTING 20X1/4 (TUBING) ×2 IMPLANT

## 2019-05-12 NOTE — Brief Op Note (Signed)
05/12/2019  12:05 PM  PATIENT:  Aaron Black  63 y.o. male  PRE-OPERATIVE DIAGNOSIS:  HOARSENESS  POST-OPERATIVE DIAGNOSIS:  HOARSENESS with vocal cord lesion  PROCEDURE:  Procedure(s): MICROLARYNGOSCOPY WITH BIOPSY (N/A)  SURGEON:  Surgeon(s) and Role:    * Rozetta Nunnery, MD - Primary  PHYSICIAN ASSISTANT:   ASSISTANTS: none   ANESTHESIA:   general  EBL:  5 mL   BLOOD ADMINISTERED:none  DRAINS: none   LOCAL MEDICATIONS USED:  NONE  SPECIMEN:  Source of Specimen:  left vocal cord  DISPOSITION OF SPECIMEN:  PATHOLOGY  COUNTS:  YES  TOURNIQUET:  * No tourniquets in log *  DICTATION: .Other Dictation: Dictation Number [id number]  PLAN OF CARE: Discharge to home after PACU  PATIENT DISPOSITION:  PACU - hemodynamically stable.   Delay start of Pharmacological VTE agent (>24hrs) due to surgical blood loss or risk of bleeding: yes

## 2019-05-12 NOTE — Anesthesia Procedure Notes (Signed)
Procedure Name: Intubation Date/Time: 05/12/2019 11:34 AM Performed by: Maryella Shivers, CRNA Pre-anesthesia Checklist: Patient identified, Emergency Drugs available, Suction available and Patient being monitored Patient Re-evaluated:Patient Re-evaluated prior to induction Oxygen Delivery Method: Circle system utilized Preoxygenation: Pre-oxygenation with 100% oxygen Induction Type: IV induction Ventilation: Mask ventilation without difficulty Laryngoscope Size: Mac and 4 Grade View: Grade I Tube type: Oral Tube size: 6.0 mm Number of attempts: 1 Airway Equipment and Method: Stylet and Oral airway Placement Confirmation: ETT inserted through vocal cords under direct vision,  positive ETCO2 and breath sounds checked- equal and bilateral Secured at: 21 cm Tube secured with: Tape Dental Injury: Teeth and Oropharynx as per pre-operative assessment

## 2019-05-12 NOTE — Anesthesia Preprocedure Evaluation (Addendum)
Anesthesia Evaluation  Patient identified by MRN, date of birth, ID band Patient awake  General Assessment Comment:HOARSENESS  Reviewed: Allergy & Precautions, NPO status , Patient's Chart, lab work & pertinent test results  History of Anesthesia Complications Negative for: history of anesthetic complications  Airway Mallampati: II  TM Distance: >3 FB Neck ROM: Limited    Dental  (+) Teeth Intact, Dental Advisory Given   Pulmonary shortness of breath, Current Smoker and Patient abstained from smoking.,    Pulmonary exam normal breath sounds clear to auscultation       Cardiovascular hypertension, Pt. on medications Normal cardiovascular exam Rhythm:Regular Rate:Normal     Neuro/Psych PSYCHIATRIC DISORDERS Anxiety Depression a. 07/2003 ant cervical deomcpression and fusion C5-6/C6-7;  b. 09/2007 post cervical laminectomy C4-5 with scres and arthrodesis C4-C7.    GI/Hepatic Neg liver ROS, GERD  Medicated and Controlled,  Endo/Other  negative endocrine ROS  Renal/GU negative Renal ROS     Musculoskeletal  (+) Arthritis ,   Abdominal   Peds  Hematology negative hematology ROS (+)   Anesthesia Other Findings Day of surgery medications reviewed with the patient.  Reproductive/Obstetrics                            Anesthesia Physical Anesthesia Plan  ASA: II  Anesthesia Plan: General   Post-op Pain Management:    Induction: Intravenous  PONV Risk Score and Plan: 2 and Midazolam, Dexamethasone and Ondansetron  Airway Management Planned: Oral ETT and Video Laryngoscope Planned  Additional Equipment:   Intra-op Plan:   Post-operative Plan: Extubation in OR  Informed Consent: I have reviewed the patients History and Physical, chart, labs and discussed the procedure including the risks, benefits and alternatives for the proposed anesthesia with the patient or authorized representative who  has indicated his/her understanding and acceptance.     Dental advisory given  Plan Discussed with: CRNA  Anesthesia Plan Comments:         Anesthesia Quick Evaluation

## 2019-05-12 NOTE — Discharge Instructions (Addendum)
Rest voice Drink plenty of liquids. Use throat lozenges for sore throat and or tylenol or ibuprofen Call office for f/u appt for Friday     [phone]     Post Anesthesia Home Care Instructions  Activity: Get plenty of rest for the remainder of the day. A responsible individual must stay with you for 24 hours following the procedure.  For the next 24 hours, DO NOT -Drive a car -Paediatric nurse -Drink alcoholic beverages -Take any medication unless instructed by your physician -Make any legal decisions or sign important papers.  Meals: Start with liquid foods such as gelatin or soup. Progress to regular foods as tolerated. Avoid greasy, spicy, heavy foods. If nausea and/or vomiting occur, drink only clear liquids until the nausea and/or vomiting subsides. Call your physician if vomiting continues.  Special Instructions/Symptoms: Your throat may feel dry or sore from the anesthesia or the breathing tube placed in your throat during surgery. If this causes discomfort, gargle with warm salt water. The discomfort should disappear within 24 hours.  If you had a scopolamine patch placed behind your ear for the management of post- operative nausea and/or vomiting:  1. The medication in the patch is effective for 72 hours, after which it should be removed.  Wrap patch in a tissue and discard in the trash. Wash hands thoroughly with soap and water. 2. You may remove the patch earlier than 72 hours if you experience unpleasant side effects which may include dry mouth, dizziness or visual disturbances. 3. Avoid touching the patch. Wash your hands with soap and water after contact with the patch.  No tylenol today until after 4:30

## 2019-05-12 NOTE — Anesthesia Postprocedure Evaluation (Signed)
Anesthesia Post Note  Patient: Aaron Black  Procedure(s) Performed: MICROLARYNGOSCOPY WITH BIOPSY (Left Throat)     Patient location during evaluation: PACU Anesthesia Type: General Level of consciousness: awake and alert Pain management: pain level controlled Vital Signs Assessment: post-procedure vital signs reviewed and stable Respiratory status: spontaneous breathing, nonlabored ventilation and respiratory function stable Cardiovascular status: blood pressure returned to baseline and stable Postop Assessment: no apparent nausea or vomiting Anesthetic complications: no    Last Vitals:  Vitals:   05/12/19 1021 05/12/19 1215  BP: (!) 158/94 112/67  Pulse: 99 65  Resp: 20 17  Temp: 36.6 C (P) 36.6 C  SpO2: 99% 100%    Last Pain:  Vitals:   05/12/19 1021  TempSrc: Oral  PainSc: 0-No pain                 Catalina Gravel

## 2019-05-12 NOTE — Transfer of Care (Signed)
Immediate Anesthesia Transfer of Care Note  Patient: Aaron Black  Procedure(s) Performed: MICROLARYNGOSCOPY WITH BIOPSY (Left Throat)  Patient Location: PACU  Anesthesia Type:General  Level of Consciousness: sedated  Airway & Oxygen Therapy: Patient Spontanous Breathing and Patient connected to face mask oxygen  Post-op Assessment: Report given to RN and Post -op Vital signs reviewed and stable  Post vital signs: Reviewed and stable  Last Vitals:  Vitals Value Taken Time  BP 112/67 05/12/19 1215  Temp    Pulse 65 05/12/19 1215  Resp 17 05/12/19 1215  SpO2 100 % 05/12/19 1215  Vitals shown include unvalidated device data.  Last Pain:  Vitals:   05/12/19 1021  TempSrc: Oral  PainSc: 0-No pain      Patients Stated Pain Goal: 3 ([id number])  Complications: No apparent anesthesia complications

## 2019-05-12 NOTE — Interval H&P Note (Signed)
History and Physical Interval Note:  05/12/2019 11:14 AM  [name]  has presented today for surgery, with the diagnosis of HOARSENESS.  The various methods of treatment have been discussed with the patient and family. After consideration of risks, benefits and other options for treatment, the patient has consented to  Procedure(s): MICROLARYNGOSCOPY WITH BIOPSY (N/A) as a surgical intervention.  The patient's history has been reviewed, patient examined, no change in status, stable for surgery.  I have reviewed the patient's chart and labs.  Questions were answered to the patient's satisfaction.     [name]

## 2019-05-13 NOTE — Op Note (Signed)
NAME: JAYDIEN, SHEAR MEDICAL RECORD [id number] ACCOUNT [id number] DATE OF BIRTH:11/27/1955 FACILITY: [facility] LOCATION: [facility] [place], [place]  OPERATIVE REPORT  DATE OF PROCEDURE:  05/12/2019  PREOPERATIVE DIAGNOSIS:  Chronic hoarseness with vocal cord lesion.  POSTOPERATIVE DIAGNOSIS:  Chronic hoarseness with vocal cord lesion.  OPERATION PERFORMED:  Microlaryngoscopy with biopsy of left true vocal cord lesion.  SURGEON:  [name], MD  ANESTHESIA:  General endotracheal.  ESTIMATED BLOOD LOSS:  Minimal.  COMPLICATIONS:  None.  BRIEF CLINICAL NOTE:  The patient is a 63 year old gentleman who has had a long history of smoking, has had persistent hoarseness now for about 4-5 months.  On exam in the office, the patient has a vocal cord lesion which is worse on the left side, but  extends anteriorly to the right anterior true vocal cord.  Findings are consistent with possible carcinoma.  The patient was taken to the operating room at this time for direct laryngoscopy and biopsy.  Both vocal cords had symmetric mobility in the  office on exam.  DESCRIPTION OF PROCEDURE:  After adequate endotracheal anesthesia, direct laryngoscopy was performed.  The base of tongue, vallecula and epiglottis were normal.  AE folds were clear.  Piriform sinuses were clear.  False cords were clear.  The vocal cords  were examined and the laryngoscope was suspended.  The patient had an irregular lesion involving the majority of the left true vocal cord, extending up to the anterior 1/2 of the right true vocal cord, extending up to the anterior commissure.  The left  side was a little bit more bulky.  Several specimens were obtained from the left true vocal cord, and a cotton pledget soaked in adrenaline was placed for hemostasis.  These were sent to pathology.  This completed the procedure.  The patient was  subsequently awoken from anesthesia, transferred to recovery room,  and postoperatively doing well.  DISPOSITION:  The patient was discharged home later this morning on his regular medication.  He will follow up in my office in 4 days for recheck and review final pathology and plan for further therapy.  LN/NUANCE  D:05/12/2019 T:05/13/2019 JOB:[id number]

## 2019-05-14 ENCOUNTER — Encounter (HOSPITAL_BASED_OUTPATIENT_CLINIC_OR_DEPARTMENT_OTHER): Payer: Self-pay | Admitting: Otolaryngology

## 2019-05-15 LAB — SURGICAL PATHOLOGY

## 2019-05-16 ENCOUNTER — Telehealth: Payer: Self-pay | Admitting: *Deleted

## 2019-05-21 ENCOUNTER — Telehealth: Payer: Self-pay | Admitting: Family Medicine

## 2019-05-21 ENCOUNTER — Other Ambulatory Visit: Payer: Self-pay | Admitting: Family Medicine

## 2019-05-21 MED ORDER — LOSARTAN POTASSIUM 100 MG PO TABS: 100.0000 mg | ORAL_TABLET | Freq: Every day | ORAL | 1 refills | Status: DC

## 2019-05-21 MED ORDER — SERTRALINE HCL 50 MG PO TABS: 50.0000 mg | ORAL_TABLET | Freq: Every day | ORAL | 1 refills | Status: DC

## 2019-05-21 NOTE — Telephone Encounter (Signed)
error 

## 2019-05-22 ENCOUNTER — Telehealth: Payer: Self-pay | Admitting: Adult Health

## 2019-05-22 ENCOUNTER — Other Ambulatory Visit: Payer: Self-pay

## 2019-05-22 ENCOUNTER — Ambulatory Visit (INDEPENDENT_AMBULATORY_CARE_PROVIDER_SITE_OTHER): Payer: 59

## 2019-05-22 ENCOUNTER — Encounter: Payer: Self-pay | Admitting: Adult Health

## 2019-05-22 DIAGNOSIS — Z23 Encounter for immunization: Secondary | ICD-10-CM | POA: Diagnosis not present

## 2019-05-22 NOTE — Telephone Encounter (Signed)
Patient wants pcp to know that he has Squamous Cell Carcinoma in his throat.  Treatment will start the middle of November at the cancer center at [facility].

## 2019-05-27 NOTE — Progress Notes (Signed)
Head and Neck Cancer Location of Tumor / Histology:  05/12/19 FINAL MICROSCOPIC DIAGNOSIS: A. VOCAL CORD, LEFT, BIOPSY: - Well to moderately differentiated squamous cell carcinoma.  Patient presented  months ago with symptoms of: He presented to the ED on 04/12/19 reporting a cough and a hoarse voice for about 2 months. He then saw Dr. Lucia Gaskins for evaluation.   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   Nutrition Status Yes No Comments  Weight changes? []  [x]    Swallowing concerns? []  [x]    PEG? []  [x]     Referrals Yes No Comments  Social Work? []  [x]    Dentistry? []  [x]    Swallowing therapy? []  [x]    Nutrition? []  [x]    Med/Onc? []  [x]     Safety Issues Yes No Comments  Prior radiation? []  [x]    Pacemaker/ICD? []  [x]    Possible current pregnancy? []  []    Is the patient on methotrexate? []  [x]     Tobacco/Marijuana/Snuff/ETOH use: Yes, he is smoking 4-5 cigarettes daily, he is trying to quit. He has a long standing history of smoking up to 3 packs per day at one time.   Past/Anticipated interventions by otolaryngology, if any:  05/12/19 OPERATION PERFORMED:  Microlaryngoscopy with biopsy of left true vocal cord lesion. SURGEON:  [name], MD  Past/Anticipated interventions by medical oncology, if any:  Not scheduled at time of note.      Current Complaints / other details:

## 2019-05-29 ENCOUNTER — Other Ambulatory Visit: Payer: Self-pay

## 2019-05-29 ENCOUNTER — Encounter: Payer: Self-pay | Admitting: Internal Medicine

## 2019-05-29 ENCOUNTER — Telehealth (INDEPENDENT_AMBULATORY_CARE_PROVIDER_SITE_OTHER): Payer: 59 | Admitting: Internal Medicine

## 2019-05-29 DIAGNOSIS — B029 Zoster without complications: Secondary | ICD-10-CM | POA: Diagnosis not present

## 2019-05-29 DIAGNOSIS — Z8619 Personal history of other infectious and parasitic diseases: Secondary | ICD-10-CM

## 2019-05-29 DIAGNOSIS — L089 Local infection of the skin and subcutaneous tissue, unspecified: Secondary | ICD-10-CM

## 2019-05-29 MED ORDER — CEPHALEXIN 500 MG PO CAPS: 500.0000 mg | ORAL_CAPSULE | Freq: Three times a day (TID) | ORAL | 0 refills | Status: AC

## 2019-05-29 MED ORDER — VALACYCLOVIR HCL 1 G PO TABS: 1000.0000 mg | ORAL_TABLET | Freq: Three times a day (TID) | ORAL | 0 refills | Status: DC

## 2019-05-29 NOTE — Progress Notes (Signed)
Virtual Visit via Video Note  I connected with@ on 05/29/19 at 10:30 AM EST by a video enabled telemedicine application and verified that I am speaking with the correct person using two identifiers. Location patient: home Location provider: home office Persons participating in the virtual visit: patient, provider  [place]th national recommendations  regarding COVID 19 pandemic   video visit is advised over in office visit for this patient.  Patient aware  of the limitations of evaluation and management by telemedicine and  availability of in person appointments. and agreed to proceed.   HPI: Aaron Black presents for video visit for a rash that began 2 to 3 days ago and is spreading. Concerned about infection. The blotches that began on the right mid upper back and now is blotches of red mildly itching slight discomfort areas along the right trunk. No specific treatment no exposures He has had a history of serious cellulitis with cat bites but has had no injury. He currently has some throat cancer and is arranging to get radiation.  Wonders if this could be a skin infection he has a prescription for Augmentin at home in case he gets a cat bite that is caused cellulitis in the past. He has a very remote history of C. difficile 10 years ago.  Antibiotic for dental procedure      ROS: See pertinent positives and negatives per HPI. No fever  seriousitching   Past Medical History:  Diagnosis Date  . Anemia   . Anxiety   . Arthritis   . B12 deficiency   . Cellulitis of arm, left 10/2017   after cat scratch  . Cervical disc disease    a. 07/2003 ant cervical deomcpression and fusion C5-6/C6-7;  b. 09/2007 post cervical laminectomy C4-5 with scres and arthrodesis C4-C7.  Marland Kitchen Chest pain at rest 02/25/2013  . Chronic lower back pain   . Complication of anesthesia    " i WOKE UP DURING A COLONOSCOPY "- 2008  . Depression   . Diverticulosis of colon   . GERD (gastroesophageal reflux  disease)   . Gout   . Hyperlipidemia   . Hypertension   . Pleurisy   . Shortness of breath dyspnea    with exertion- "I cant exercise now"  . Tobacco abuse    a. 40 yrs, 1.5-3 ppd over that time.    Past Surgical History:  Procedure Laterality Date  . ANTERIOR LATERAL LUMBAR FUSION 4 LEVELS Right 06/05/2014   Procedure: ANTERIOR LATERAL LUMBAR FUSION  LUMBAR ONE TO LUMBAR FIVE with Percutaneous Pedicle Screws;  Surgeon: Erline Levine, MD;  Location: [facility];  Service: Neurosurgery;  Laterality: Right;  . CARPAL TUNNEL RELEASE Right   . CERVICAL DISC SURGERY     X 2  . COLONOSCOPY    . ELBOW ARTHROSCOPY Right   . KNEE ARTHROSCOPY Right 08/2013   torn menicus  . KNEE ARTHROSCOPY Left 11/2013   chip cartlidge,torn menicus  . LUMBAR LAMINECTOMY/DECOMPRESSION MICRODISCECTOMY Right 07/11/2013   Procedure: LUMBAR LAMINECTOMY/DECOMPRESSION MICRODISCECTOMY 1 LEVEL;  Surgeon: Erline Levine, MD;  Location: [place] [facility];  Service: Neurosurgery;  Laterality: Right;  Right L34 microdiskectomy  . LUMBAR PERCUTANEOUS PEDICLE SCREW 4 LEVEL N/A 06/05/2014   Procedure: LUMBAR PERCUTANEOUS PEDICLE SCREW 4 LEVEL;  Surgeon: Erline Levine, MD;  Location: [facility] [facility];  Service: Neurosurgery;  Laterality: N/A;  . MICROLARYNGOSCOPY Left 05/12/2019   Procedure: MICROLARYNGOSCOPY WITH BIOPSY;  Surgeon: Rozetta Nunnery, MD;  Location: [facility];  Service: ENT;  Laterality: Left;  . NECK SURGERY  January 2005, March 2009   C-Spine  . ROTATOR CUFF REPAIR  August 2000, January 2002, July 2008, July 2009   2 left 2 right    Family History  Problem Relation Age of Onset  . Bone cancer Mother   . Squamous cell carcinoma Mother        died @ 42  . Heart attack Mother   . Hypertension Father   . Diabetes Father        borderline  . Congestive Heart Failure Father        alive @ 42  . Heart failure Father   . Sudden death Brother        died @ 66  . Other Brother        died in  [place] @ 71 - struck by drunk driver  . Other Brother        died in [place] @ 68 - struck by drunk driver  . Heart attack Brother   . Stroke Paternal Grandfather   . Colon cancer Neg Hx   . Esophageal cancer Neg Hx   . Rectal cancer Neg Hx   . Stomach cancer Neg Hx     Social History   Tobacco Use  . Smoking status: Current Some Day Smoker    Packs/day: 0.50    Years: 40.00    Pack years: 20.00    Types: Cigarettes  . Smokeless tobacco: Never Used  . Tobacco comment: Currently smoking 1.5 ppd.  Has previously smoked up to 3 ppd and did so for about 20 yrs.  Substance Use Topics  . Alcohol use: Yes    Comment: occasional  . Drug use: No      Current Outpatient Medications:  .  albuterol (VENTOLIN HFA) 108 (90 Base) MCG/ACT inhaler, Inhale 2 puffs into the lungs every 6 (six) hours as needed for wheezing or shortness of breath., Disp: 8 g, Rfl: 0 .  allopurinol (ZYLOPRIM) 100 MG tablet, TAKE 1 TABLET BY MOUTH EVERY DAY, Disp: 90 tablet, Rfl: 1 .  ALPRAZolam (XANAX) 0.5 MG tablet, TAKE 1 TABLET BY MOUTH THREE TIMES A DAY AS NEEDED FOR ANXIETY, Disp: 90 tablet, Rfl: 1 .  atorvastatin (LIPITOR) 40 MG tablet, Take 1 tablet (40 mg total) by mouth daily., Disp: 90 tablet, Rfl: 3 .  cephALEXin (KEFLEX) 500 MG capsule, Take 1 capsule (500 mg total) by mouth 3 (three) times daily for 21 doses. If needed for bacterial skin infection, Disp: 20 capsule, Rfl: 0 .  colchicine 0.6 MG tablet, TAKE 1 TABLET (0.6 MG TOTAL) BY MOUTH DAILY AS NEEDED (GOUT)., Disp: 90 tablet, Rfl: 1 .  cyclobenzaprine (FLEXERIL) 10 MG tablet, Take 10 mg by mouth daily., Disp: , Rfl:  .  diclofenac sodium (VOLTAREN) 1 % GEL, Apply 2 g topically as needed (for pain). , Disp: , Rfl: 0 .  diltiazem (CARDIZEM CD) 360 MG 24 hr capsule, Take 1 capsule (360 mg total) by mouth daily., Disp: 90 capsule, Rfl: 3 .  HYDROcodone-acetaminophen (NORCO/VICODIN) 5-325 MG tablet, Take 1 tablet by mouth every 8 (eight) hours as needed for  moderate pain. , Disp: , Rfl: 0 .  losartan (COZAAR) 100 MG tablet, Take 1 tablet (100 mg total) by mouth daily., Disp: 90 tablet, Rfl: 1 .  omeprazole (PRILOSEC OTC) 20 MG tablet, Take 2 tablets (40 mg total) by mouth daily., Disp: , Rfl:  .  sertraline (ZOLOFT) 50 MG tablet,  Take 1 tablet (50 mg total) by mouth daily., Disp: 90 tablet, Rfl: 1 .  valACYclovir (VALTREX) 1000 MG tablet, Take 1 tablet (1,000 mg total) by mouth 3 (three) times daily. For shingles, Disp: 21 tablet, Rfl: 0 .  vitamin B-12 (CYANOCOBALAMIN) 1000 MCG tablet, Take 1,000 mcg by mouth daily., Disp: , Rfl:   EXAM: BP Readings from Last 3 Encounters:  05/12/19 140/82  04/12/19 (!) 149/98  01/24/19 132/83    VITALS per patient if applicable:  GENERAL: alert, oriented, appears well and in no acute distress  HEENT: atraumatic, conjunttiva clear, no obvious abnormalities on inspection of external nose and ears voice mildy hoarse  No stridor or resp difficulty   NECK: normal movements of the head and neck  LUNGS: on inspection no signs of respiratory distress, breathing rate appears normal, no obvious gross SOB, gasping or wheezing SKIN: There are a number of blotches 1 that has central excoriation right back underneath the scapula there now irregular pink blotches a bit bumpy along the right lower thoracic area and one in the right anterior axillary line.  There may be tiny bumps or vesicles in a few of them. CV: no obvious cyanosis  MS: moves all visible extremities without noticeable abnormality  PSYCH/NEURO: pleasant and cooperative, no obvious depression or anxiety, speech and thought processing grossly intact   ASSESSMENT AND PLAN:  Discussed the following assessment and plan:    ICD-10-CM   1. Herpes zoster without complication  Q000111Q   2. Skin infection  ?  L08.9    see text   3. History of Clostridioides difficile infection  Z86.19    Distribution is suspicious for shingles though he is also worried  about a bacterial infection because he has had cellulitis before albeit from a cat bite. And some lesions atypical at this time  At this time begin Valtrex ASAP and can use a topical antibiotic in case Prescription for Keflex sent to his pharmacy but  Cautioned not to use unless signs of bacterial infection that we reviewed would be noted.   Patient aware of risk of antibiotic   Suspect it could be impetiginous but probably not. If he feels he has to start the antibiotic he should begin probiotics and minimize use. Expectant management if this is shingles. Counseled.   Expectant management and discussion of plan and treatment with opportunity to ask questions and all were answered. The patient agreed with the plan and demonstrated an understanding of the instructions.   Advised to call back or seek an in-person evaluation if worsening  or having  further concerns .    Shanon Ace, MD

## 2019-06-02 NOTE — Progress Notes (Addendum)
Radiation Oncology         (336) [phone] ________________________________  Initial outpatient Consultation by telephone as patient was unable to access MyChart video during pandemic precautions   Name: Aaron Black MRN: [id number]  Date: 06/03/2019  DOB: May 29, 1956  [id number], Aaron Rumps, NP  Rozetta Nunnery, *   REFERRING PHYSICIAN: Rozetta Nunnery, *  DIAGNOSIS:    ICD-10-CM   1. 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 male who presented with a 4 month history of hoarseness.  Subsequently, the patient saw Dr. Lucia Gaskins and was found to have a lesion on the true vocal cord extending up to the anterior commissure more on the left side.  I have spoken with Dr. Lucia Gaskins specifically about this patient and his impression is that both vocal cords are with normal mobility and that the cancer is confined to the glottis.  However the cancer involves the bilateral glottis and anterior commissure.  Dr. Lucia Gaskins took the patient for microlaryngoscopy and biopsy on 05/12/2019. "The patient had an irregular lesion involving the majority of the left true vocal cord, extending up to the anterior 1/2 of the right true vocal cord, extending up to the anterior commissure.  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  Swallowing issues, if any: None.    Weight Changes: none Wt Readings from Last 3 Encounters:  05/12/19 169 lb 15.6 oz (77.1 kg)  01/24/19 178 lb (80.7 kg)   09/18/18 173 lb (78.5 kg)   Other symptoms: hoarseness  Tobacco history, if any: current smoker -he has tapered down to 4-5 cigarettes a day but previously smoked quite heavily.  Has at least a 20-year pack history.  ETOH abuse, if any:  occasional alcohol use  Prior cancers, if any: none  He is coping with shingles on his right upper shoulder with pain, taking medication for it.  PREVIOUS RADIATION THERAPY: No  PAST MEDICAL HISTORY:  has a past medical history of Anemia, Anxiety, Arthritis, B12 deficiency, Cellulitis of arm, left (10/2017), Cervical disc disease, Chest pain at rest (02/25/2013), Chronic lower back pain, Complication of anesthesia, Depression, Diverticulosis of colon, GERD (gastroesophageal reflux disease), Gout, Hyperlipidemia, Hypertension, Pleurisy, Shortness of breath dyspnea, and Tobacco abuse.    PAST SURGICAL HISTORY: Past Surgical History:  Procedure Laterality Date  . ANTERIOR LATERAL LUMBAR FUSION 4 LEVELS Right 06/05/2014   Procedure: ANTERIOR LATERAL LUMBAR FUSION  LUMBAR ONE TO LUMBAR FIVE with Percutaneous Pedicle Screws;  Surgeon: Erline Levine, MD;  Location: [place] [facility];  Service: Neurosurgery;  Laterality: Right;  . CARPAL TUNNEL RELEASE Right   . CERVICAL DISC SURGERY     X 2  . COLONOSCOPY    . ELBOW ARTHROSCOPY Right   . KNEE ARTHROSCOPY Right 08/2013   torn menicus  . KNEE ARTHROSCOPY Left 11/2013   chip [place],torn menicus  . LUMBAR LAMINECTOMY/DECOMPRESSION MICRODISCECTOMY Right 07/11/2013   Procedure: LUMBAR LAMINECTOMY/DECOMPRESSION MICRODISCECTOMY 1 LEVEL;  Surgeon: Erline Levine, MD;  Location: [place]  ORS;  Service: Neurosurgery;  Laterality: Right;  Right L34 microdiskectomy  . LUMBAR PERCUTANEOUS PEDICLE SCREW 4 LEVEL N/A 06/05/2014   Procedure: LUMBAR PERCUTANEOUS PEDICLE SCREW 4 LEVEL;  Surgeon: Erline Levine, MD;  Location: [place] [facility];  Service: Neurosurgery;  Laterality: N/A;  . MICROLARYNGOSCOPY Left 05/12/2019   Procedure:  MICROLARYNGOSCOPY WITH BIOPSY;  Surgeon: Rozetta Nunnery, MD;  Location: [place];  Service: ENT;  Laterality: Left;  . NECK SURGERY  January 2005, March 2009   C-Spine  . ROTATOR CUFF REPAIR  August 2000, January 2002, July 2008, July 2009   2 left 2 right    FAMILY HISTORY: family history includes Bone cancer in his mother; Congestive Heart Failure in his father; Diabetes in his father; Heart attack in his brother and mother; Heart failure in his father; Hypertension in his father; Other in his brother and brother; Squamous cell carcinoma in his mother; Stroke in his paternal grandfather; Sudden death in his brother.  SOCIAL HISTORY:  reports that he has been smoking cigarettes. He has a 20.00 pack-year smoking history. He has never used smokeless tobacco. He reports current alcohol use. He reports that he does not use drugs.  ALLERGIES: Lisinopril and Percodan [oxycodone-aspirin]  MEDICATIONS:  Current Outpatient Medications  Medication Sig Dispense Refill  . albuterol (VENTOLIN HFA) 108 (90 Base) MCG/ACT inhaler Inhale 2 puffs into the lungs every 6 (six) hours as needed for wheezing or shortness of breath. 8 g 0  . allopurinol (ZYLOPRIM) 100 MG tablet TAKE 1 TABLET BY MOUTH EVERY DAY 90 tablet 1  . ALPRAZolam (XANAX) 0.5 MG tablet TAKE 1 TABLET BY MOUTH THREE TIMES A DAY AS NEEDED FOR ANXIETY 90 tablet 1  . atorvastatin (LIPITOR) 40 MG tablet Take 1 tablet (40 mg total) by mouth daily. 90 tablet 3  . colchicine 0.6 MG tablet TAKE 1 TABLET (0.6 MG TOTAL) BY MOUTH DAILY AS NEEDED (GOUT). 90 tablet 1  . cyclobenzaprine (FLEXERIL) 10 MG tablet Take 10 mg by mouth daily.    . diclofenac sodium (VOLTAREN) 1 % GEL Apply 2 g topically as needed (for pain).   0  . diltiazem (CARDIZEM CD) 360 MG 24 hr capsule Take 1 capsule (360 mg total) by mouth daily. 90 capsule 3  . HYDROcodone-acetaminophen (NORCO/VICODIN) 5-325 MG tablet Take 1 tablet by mouth every 8 (eight) hours as  needed for moderate pain.   0  . losartan (COZAAR) 100 MG tablet Take 1 tablet (100 mg total) by mouth daily. 90 tablet 1  . omeprazole (PRILOSEC OTC) 20 MG tablet Take 2 tablets (40 mg total) by mouth daily.    . sertraline (ZOLOFT) 50 MG tablet Take 1 tablet (50 mg total) by mouth daily. 90 tablet 1  . valACYclovir (VALTREX) 1000 MG tablet Take 1 tablet (1,000 mg total) by mouth 3 (three) times daily. For shingles 21 tablet 0  . vitamin B-12 (CYANOCOBALAMIN) 1000 MCG tablet Take 1,000 mcg by mouth daily.    . cephALEXin (KEFLEX) 500 MG capsule Take 1 capsule (500 mg total) by mouth 3 (three) times daily for 21 doses. If needed for bacterial skin infection (Patient not taking: Reported on 06/03/2019) 20 capsule 0   No current facility-administered medications for this encounter.     REVIEW OF SYSTEMS:  Notable for that above.   PHYSICAL EXAM:  vitals were not taken for this visit.     LABORATORY DATA:  Lab Results  Component Value Date   WBC 7.4 01/24/2019  HGB 13.6 01/24/2019   HCT 40.6 01/24/2019   MCV 93.5 01/24/2019   PLT 246 01/24/2019   CMP     Component Value Date/Time   NA 135 01/24/2019 1021   K 4.2 01/24/2019 1021   CL 103 01/24/2019 1021   CO2 21 (L) 01/24/2019 1021   GLUCOSE 114 (H) 01/24/2019 1021   BUN 16 01/24/2019 1021   CREATININE 1.37 (H) 01/24/2019 1021   CALCIUM 9.1 01/24/2019 1021   PROT 7.5 09/18/2018 0946   ALBUMIN 4.8 09/18/2018 0946   AST 44 (H) 09/18/2018 0946   ALT 50 09/18/2018 0946   ALKPHOS 96 09/18/2018 0946   BILITOT 0.4 09/18/2018 0946   GFRNONAA 55 (L) 01/24/2019 1021   GFRAA >60 01/24/2019 1021      Lab Results  Component Value Date   TSH 0.63 09/18/2018     RADIOGRAPHY: No results found.    IMPRESSION/PLAN:  This is a delightful patient with head and neck cancer.  Dr. Lucia Gaskins wanted the patient to be considered for nonsurgical methods in the interest of voice preservation. I recommend 6 weeks of radiotherapy for this patient.   We discussed the potential risks, benefits, and side effects of radiotherapy. We talked in detail about acute and late effects. We discussed that some of the most bothersome acute effects may be mucositis, dysgeusia, thick phlegm, dysphagia, skin irritation, weight loss. We talked about late effects which include but are not necessarily limited to dysphagia, hypothyroidism, tissue injury in the larynx and throat. No guarantees of treatment were given.  The patient is enthusiastic about proceeding with treatment. I look forward to participating in the patient's care.    I asked the patient today about tobacco use. The patient uses tobacco.  I advised the patient to quit. Services were offered by me today including outpatient counseling and pharmacotherapy. I assessed for the willingness to attempt to quit and provided encouragement and demonstrated willingness to make referrals and/or prescriptions to help the patient attempt to quit.  Quit date will be start date of radiation which is anticipated to be November 16. The patient has follow-up with the oncologic team to touch base on their tobacco use and /or cessation efforts.  Over 3 minutes were spent on this issue.  He plans to pursue counseling through his insurance company as well as use Nicorette gum.  Simulation (treatment planning) will take place in the near future, I have requested a simulation tomorrow in our schedule.  We also discussed that the treatment of head and neck cancer is a multidisciplinary process to maximize treatment outcomes and quality of life. For this reason the following referrals have been or will be made:    Nutritionist for nutrition support during and after treatment.   Speech language pathology for swallowing and/or speech therapy.   Social work for social support.   This encounter was provided by telemedicine platform by telephone as patient was unable to access MyChart video during pandemic precautions The  patient has given verbal consent for this type of encounter and has been advised to only accept a meeting of this type in a secure network environment. The time spent during this encounter was 23 minutes. The attendants for this meeting include Eppie Gibson  and JONAVAN KLECK.  Also patient's wife, and Gayleen Orem, RN, our Head and Neck Oncology Navigator During the encounter, Eppie Gibson was located at [facility] Radiation Oncology Department.  MACIAH RAMPONE was located at home.  __________________________________________   Eppie Gibson, MD   This document serves as a record of services personally performed by Eppie Gibson, MD. It was created on her behalf by Wilburn Mylar, a trained medical scribe. The creation of this record is based on the scribe's personal observations and the provider's statements to them. This document has been checked and approved by the attending provider.

## 2019-06-03 ENCOUNTER — Ambulatory Visit
Admission: RE | Admit: 2019-06-03 | Discharge: 2019-06-03 | Disposition: A | Discharge status: Home / Self Care | Payer: 59 | Source: Ambulatory Visit | Attending: Radiation Oncology | Admitting: Radiation Oncology

## 2019-06-03 ENCOUNTER — Ambulatory Visit: Payer: 59

## 2019-06-03 ENCOUNTER — Encounter: Payer: Self-pay | Admitting: Radiation Oncology

## 2019-06-03 ENCOUNTER — Other Ambulatory Visit: Payer: Self-pay

## 2019-06-03 ENCOUNTER — Telehealth: Payer: Self-pay | Admitting: Radiation Oncology

## 2019-06-03 ENCOUNTER — Encounter: Payer: Self-pay | Admitting: *Deleted

## 2019-06-03 DIAGNOSIS — C320 Malignant neoplasm of glottis: Secondary | ICD-10-CM

## 2019-06-03 NOTE — Telephone Encounter (Signed)
Placed introductory call to new referral patient Aaron Black.   Introduced myself as the H&N oncology nurse navigator that works with Dr. Isidore Moos to whom he has been referred by Dr. Lucia Gaskins.  He confirmed understanding of referral.  Briefly explained my role as his navigator, provided my contact information.   Confirmed understanding of pending appts, [place] location, explained arrival and registration process for in-person appts.  He voiced understanding initial consult my be by telephone.  I explained the purpose of a dental evaluation prior to starting RT, indicated he may be contacted by [facility] DM to arrange an appt.    I encouraged him to call with questions/concerns as he moves forward with appts and procedures.    He verbalized understanding of information provided, expressed appreciation for my call.  Navigator Initial Assessment . Employment Status:  retired . Currently on FMLA / STD: N/A . Living Situation: lives with wife . Support System: wife . PCP: yes, Heidi Dach, NP . PCD: yes, [facility] . Financial Concerns: no . Transportation Needs: no . Sensory Deficits: no . Language Barriers/Interpreter Needed:  no . Ambulation Needs: no . DME Used in Home: no . Psychosocial Needs:  no . Concerns/Needs Understanding Cancer:  addressed/answered by navigator to best of ability . Self-Expressed Needs: no

## 2019-06-03 NOTE — Telephone Encounter (Signed)
Scheduled appt per 11/10 sch message - pt is aware of appt date and time   

## 2019-06-04 ENCOUNTER — Other Ambulatory Visit: Payer: Self-pay

## 2019-06-04 ENCOUNTER — Inpatient Hospital Stay: Payer: No Typology Code available for payment source | Admitting: Nutrition

## 2019-06-04 ENCOUNTER — Ambulatory Visit
Admission: RE | Admit: 2019-06-04 | Discharge: 2019-06-04 | Disposition: A | Discharge status: Home / Self Care | Payer: No Typology Code available for payment source | Source: Ambulatory Visit | Attending: Radiation Oncology | Admitting: Radiation Oncology

## 2019-06-04 ENCOUNTER — Encounter: Payer: Self-pay | Admitting: *Deleted

## 2019-06-04 ENCOUNTER — Encounter: Payer: Self-pay | Admitting: Radiation Oncology

## 2019-06-04 ENCOUNTER — Other Ambulatory Visit: Payer: Self-pay | Admitting: Adult Health

## 2019-06-04 DIAGNOSIS — R490 Dysphonia: Secondary | ICD-10-CM | POA: Diagnosis not present

## 2019-06-04 DIAGNOSIS — C320 Malignant neoplasm of glottis: Secondary | ICD-10-CM | POA: Diagnosis not present

## 2019-06-04 DIAGNOSIS — R1310 Dysphagia, unspecified: Secondary | ICD-10-CM | POA: Diagnosis not present

## 2019-06-04 NOTE — Progress Notes (Signed)
63 year old male diagnosed with cancer of the L[place]nex.  He will receive 6 weeks of radiation therapy.  Past medical history includes tobacco usage, anemia, anxiety, B12 deficiency, depression, diverticulosis, GERD, gout, hyperlipidemia, and hypertension.  Medications include Xanax, Lipitor, Prilosec, and vitamin B12.  Labs were reviewed.  Height: 5 feet 7 inches. Weight: 172.2 pounds November 11. Usual body weight:  Patient reports usual body weight is 168 pounds. 178 pounds in July 2020.  BMI: 26.97.  Patient denies nutrition impact symptoms.  He has no food allergies or intolerances.  He does the cooking in his home.  Nutrition diagnosis:  Food and nutrition related knowledge deficit related to new diagnosis of L[place]nex cancer associated treatments as evidenced by no prior need for nutrition related information.  Intervention: Educated patient on consuming smaller more frequent meals and snacks with adequate calories and protein to achieve weight maintenance. Recommended patient explore protein drinks to add to his diet if swallowing becomes difficult. Provided samples and coupons. Gave patient fact sheets on increasing calories and protein, soft protein foods, and recipes for smoothies.  Also gave patient my contact information.  Monitoring, evaluation, goals: Patient will tolerate adequate calories and protein to minimize weight loss.  Next visit: Phone follow-up scheduled on December 2.  **Disclaimer: This note was dictated with voice recognition software. Similar sounding words can inadvertently be transcribed and this note may contain transcription errors which may not have been corrected upon publication of note.**

## 2019-06-04 NOTE — Progress Notes (Signed)
Oncology Nurse Navigator Documentation  Joined Mr. Catania during Telemedicine Consult with Dr. Isidore Moos.  He was joined by his wife. He voiced understanding of:  RT tmt plan of 6 weeks, M-F; no chemo.  Encouragement to quit smoking current 4-5 daily cigarettes, recommendation to set date for last cigarette.  Next step CT SIM.  D/t claustrophobia, he requested open face mask.  He agreed to call me with questions/concerns as he moves forward with appt/tmt.  Gayleen Orem, RN, BSN Head & Neck Oncology Nurse [place] at [place] [phone]

## 2019-06-04 NOTE — Progress Notes (Signed)
  Radiation Oncology         (336) [phone] ________________________________  Name: Aaron Black MRN: [id number]  Date: 06/04/2019  DOB: 1955/12/20  SIMULATION AND TREATMENT PLANNING NOTE  Outpatient    ICD-10-CM   1. 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  Identity was confirmed.  All relevant records and images related to the planned course of therapy were reviewed.  The patient freely provided informed written consent to proceed with treatment after reviewing the details related to the planned course of therapy. The consent form was witnessed and verified by the simulation staff.    Then, the patient was set-up in a stable reproducible supine position for radiation therapy.  Aquaplast head and should mask was custom fabricated for immobilization.  CT images were obtained without contrast.  Surface markings were placed.  The CT images were loaded into the planning software.    TREATMENT PLANNING NOTE: Treatment planning then occurred.  The radiation prescription was entered and confirmed.    A total of 3 medically necessary complex treatment devices were fabricated and supervised by me (2 wedges for the opposed fields and the Aquaplast head and shoulder mask). I have requested : 3D Simulation  I have requested a DVH of the following structures: target volume, esophagus, spinal cord.  I have ordered:Nutrition Consult  The patient will receive 63 Gy in 28 fractions to the larynx.  PHOTO FROM DR Lucia Gaskins:    -----------------------------------  Eppie Gibson, MD

## 2019-06-05 NOTE — Telephone Encounter (Signed)
Sent to the pharmacy by e-scribe.  Last cpx 08/2018

## 2019-06-05 NOTE — Progress Notes (Signed)
Oncology Nurse Navigator Documentation  To provide support, encouragement and care continuity, met with Mr. Copeman during his CT SIM.     He tolerated procedure without difficulty, denied questions/concerns.  I toured him to [facility] 1 treatment area, explained procedures for lobby registration, arrival to Radiation Waiting, arrival to tmt area and preparation for tmt.     . Provided New Patient Information packet, discussed contents: o Contact information for physician(s), myself, other members of the Care Team. o Advance Directive information ([place] blue pamphlet with LCSW contact info); provided [facility] AD booklet, encouraged him to contact [place] LCSW to complete. o Fall Prevention Patient [place] o [facility] campus map with highlight of [place] o SLP information sheet o Symptom Management Clinic information . Provided Epic appt calendar, reviewed appts.  Again discussed his attendance at next Thursday morning's H&N [place] to see SLP Garald Balding. . Escorted him to Patient and [facility] for appt with Nutrition. . I encouraged him call me with questions/concerns prior to [facility] RT New Start.  He agreed to do so.   Gayleen Orem, RN, BSN Head & Neck Oncology Nurse [place] at [place] [phone]

## 2019-06-06 DIAGNOSIS — C320 Malignant neoplasm of glottis: Secondary | ICD-10-CM | POA: Diagnosis not present

## 2019-06-06 DIAGNOSIS — R1310 Dysphagia, unspecified: Secondary | ICD-10-CM | POA: Diagnosis not present

## 2019-06-06 DIAGNOSIS — R490 Dysphonia: Secondary | ICD-10-CM | POA: Diagnosis not present

## 2019-06-09 ENCOUNTER — Other Ambulatory Visit: Payer: Self-pay

## 2019-06-09 ENCOUNTER — Encounter: Payer: Self-pay | Admitting: *Deleted

## 2019-06-09 ENCOUNTER — Other Ambulatory Visit: Payer: Self-pay | Admitting: Radiation Oncology

## 2019-06-09 ENCOUNTER — Ambulatory Visit
Admission: RE | Admit: 2019-06-09 | Discharge: 2019-06-09 | Disposition: A | Discharge status: Home / Self Care | Payer: No Typology Code available for payment source | Source: Ambulatory Visit | Attending: Radiation Oncology | Admitting: Radiation Oncology

## 2019-06-09 DIAGNOSIS — C320 Malignant neoplasm of glottis: Secondary | ICD-10-CM | POA: Diagnosis not present

## 2019-06-09 DIAGNOSIS — R1310 Dysphagia, unspecified: Secondary | ICD-10-CM | POA: Diagnosis not present

## 2019-06-09 DIAGNOSIS — R490 Dysphonia: Secondary | ICD-10-CM | POA: Diagnosis not present

## 2019-06-09 MED ORDER — SONAFINE EX EMUL
1.0000 "application " | Freq: Once | CUTANEOUS | Status: AC
  Administered 2019-06-09: 1 via TOPICAL

## 2019-06-09 MED ORDER — LIDOCAINE VISCOUS HCL 2 % MT SOLN: OROMUCOSAL | 5 refills | Status: DC

## 2019-06-09 NOTE — Progress Notes (Signed)

## 2019-06-10 ENCOUNTER — Other Ambulatory Visit: Payer: Self-pay

## 2019-06-10 ENCOUNTER — Telehealth: Payer: Self-pay | Admitting: *Deleted

## 2019-06-10 ENCOUNTER — Ambulatory Visit
Admission: RE | Admit: 2019-06-10 | Discharge: 2019-06-10 | Disposition: A | Discharge status: Home / Self Care | Payer: No Typology Code available for payment source | Source: Ambulatory Visit | Attending: Radiation Oncology | Admitting: Radiation Oncology

## 2019-06-10 DIAGNOSIS — R1310 Dysphagia, unspecified: Secondary | ICD-10-CM | POA: Diagnosis not present

## 2019-06-10 DIAGNOSIS — C320 Malignant neoplasm of glottis: Secondary | ICD-10-CM | POA: Diagnosis not present

## 2019-06-10 DIAGNOSIS — R490 Dysphonia: Secondary | ICD-10-CM | POA: Diagnosis not present

## 2019-06-10 NOTE — Telephone Encounter (Signed)
Oncology Nurse Navigator Documentation  Called Mr. Lankenau, confirmed 8:15 arrival Thursday morning to register for H&N [place] followed by arrival to Radiation Waiting.  He voiced understanding he will meet with SLP Garald Balding after which he will have RT.  Gayleen Orem, RN, BSN Head & Neck Oncology Nurse [place] at [place] [phone]

## 2019-06-10 NOTE — Progress Notes (Signed)
Oncology Nurse Navigator Documentation  To provide support, encouragement and care continuity, met with Aaron Black for his initial  RT.    Reviewed the 2-step treatment process, answered questions.   Aaron Black completed treatment without difficulty, denied questions/concerns.  Reviewed the registration/arrival procedure for subsequent treatments.  Encouraged him to call me with questions/concerns as tmts proceed.  Escorted him to Nursing for weekly PET with Dr. Isidore Moos.  Gayleen Orem, RN, BSN Head & Neck Oncology Nurse [place] at [place] [phone]

## 2019-06-11 ENCOUNTER — Other Ambulatory Visit: Payer: Self-pay

## 2019-06-11 ENCOUNTER — Ambulatory Visit
Admission: RE | Admit: 2019-06-11 | Discharge: 2019-06-11 | Disposition: A | Discharge status: Home / Self Care | Payer: No Typology Code available for payment source | Source: Ambulatory Visit | Attending: Radiation Oncology | Admitting: Radiation Oncology

## 2019-06-11 DIAGNOSIS — C320 Malignant neoplasm of glottis: Secondary | ICD-10-CM | POA: Diagnosis not present

## 2019-06-11 DIAGNOSIS — R490 Dysphonia: Secondary | ICD-10-CM | POA: Diagnosis not present

## 2019-06-11 DIAGNOSIS — R1310 Dysphagia, unspecified: Secondary | ICD-10-CM | POA: Diagnosis not present

## 2019-06-12 ENCOUNTER — Ambulatory Visit: Payer: No Typology Code available for payment source | Admitting: Physical Therapy

## 2019-06-12 ENCOUNTER — Ambulatory Visit: Payer: No Typology Code available for payment source | Attending: Radiation Oncology

## 2019-06-12 ENCOUNTER — Ambulatory Visit
Admission: RE | Admit: 2019-06-12 | Discharge: 2019-06-12 | Disposition: A | Discharge status: Home / Self Care | Payer: No Typology Code available for payment source | Source: Ambulatory Visit | Attending: Radiation Oncology | Admitting: Radiation Oncology

## 2019-06-12 ENCOUNTER — Other Ambulatory Visit: Payer: Self-pay

## 2019-06-12 DIAGNOSIS — R1310 Dysphagia, unspecified: Secondary | ICD-10-CM | POA: Diagnosis not present

## 2019-06-12 DIAGNOSIS — R490 Dysphonia: Secondary | ICD-10-CM | POA: Diagnosis not present

## 2019-06-12 DIAGNOSIS — C320 Malignant neoplasm of glottis: Secondary | ICD-10-CM | POA: Diagnosis not present

## 2019-06-12 NOTE — Patient Instructions (Signed)
SWALLOWING EXERCISES Do these 6 of the 7 days per week until 6 monhts after your last day of radiation, then 2 times per week afterwards  1. Effortful Swallows - Press your tongue against the roof of your mouth for 3 seconds, then squeeze          the muscles in your neck while you swallow your saliva or a sip of water - Repeat 10-15 times, 2-3 times a day, and use whenever you eat or drink  2. Pitch Raise - Repeat "he", once per second in as high of a pitch as you can - Repeat 20 times, 2-3 times a day  3. Mendelsohn Maneuver - "half swallow" exercise - Start to swallow, and keep your Adam's apple up by squeezing hard with the            muscles of the throat - Hold the squeeze for 5-7 seconds and then relax - Repeat 15-20 times, 2 times a day *use a wet spoon if your mouth gets dry*  4. Breath Hold - Say "HUH!" loudly, then hold your breath for 3 seconds at your voice box - Repeat 20 times, 2-3 times a day       5.   Siren Exercise  - Say "ooo" as low as you can and go higher pitch as high as you can on "eee", then back down to as low as you can  - repeat 10x, 2x/day

## 2019-06-12 NOTE — Therapy (Signed)
[facility] [address] [place], [place], [address] Phone: [phone]   Fax:  [phone]  Speech Language Pathology Evaluation  Patient Details  Name: Aaron Black MRN: [id number] Date of Birth: Nov 21, 1955 Referring Provider (SLP): Eppie Gibson, MD   Encounter Date: 06/12/2019  End of Session - 06/12/19 1026    Visit Number  1    Number of Visits  7    Date for SLP Re-Evaluation  12/12/19    SLP Start Time  0825    SLP Stop Time   0905    SLP Time Calculation (min)  40 min    Activity Tolerance  Patient tolerated treatment well       Past Medical History:  Diagnosis Date  . Anemia   . Anxiety   . Arthritis   . B12 deficiency   . Cellulitis of arm, left 10/2017   after cat scratch  . Cervical disc disease    a. 07/2003 ant cervical deomcpression and fusion C5-6/C6-7;  b. 09/2007 post cervical laminectomy C4-5 with scres and arthrodesis C4-C7.  Marland Kitchen Chest pain at rest 02/25/2013  . Chronic lower back pain   . Complication of anesthesia    " i WOKE UP DURING A COLONOSCOPY "- 2008  . Depression   . Diverticulosis of colon   . GERD (gastroesophageal reflux disease)   . Gout   . Hyperlipidemia   . Hypertension   . Pleurisy   . Shortness of breath dyspnea    with exertion- "I cant exercise now"  . Tobacco abuse    a. 40 yrs, 1.5-3 ppd over that time.    Past Surgical History:  Procedure Laterality Date  . ANTERIOR LATERAL LUMBAR FUSION 4 LEVELS Right 06/05/2014   Procedure: ANTERIOR LATERAL LUMBAR FUSION  LUMBAR ONE TO LUMBAR FIVE with Percutaneous Pedicle Screws;  Surgeon: Erline Levine, MD;  Location: [facility];  Service: Neurosurgery;  Laterality: Right;  . CARPAL TUNNEL RELEASE Right   . CERVICAL DISC SURGERY     X 2  . COLONOSCOPY    . ELBOW ARTHROSCOPY Right   . KNEE ARTHROSCOPY Right 08/2013   torn menicus  . KNEE ARTHROSCOPY Left 11/2013   chip cartlidge,torn menicus  . LUMBAR  LAMINECTOMY/DECOMPRESSION MICRODISCECTOMY Right 07/11/2013   Procedure: LUMBAR LAMINECTOMY/DECOMPRESSION MICRODISCECTOMY 1 LEVEL;  Surgeon: Erline Levine, MD;  Location: [facility];  Service: Neurosurgery;  Laterality: Right;  Right L34 microdiskectomy  . LUMBAR PERCUTANEOUS PEDICLE SCREW 4 LEVEL N/A 06/05/2014   Procedure: LUMBAR PERCUTANEOUS PEDICLE SCREW 4 LEVEL;  Surgeon: Erline Levine, MD;  Location: [facility];  Service: Neurosurgery;  Laterality: N/A;  . MICROLARYNGOSCOPY Left 05/12/2019   Procedure: MICROLARYNGOSCOPY WITH BIOPSY;  Surgeon: Rozetta Nunnery, MD;  Location: [facility];  Service: ENT;  Laterality: Left;  . NECK SURGERY  January 2005, March 2009   C-Spine  . ROTATOR CUFF REPAIR  August 2000, January 2002, July 2008, July 2009   2 left 2 right    There were no vitals filed for this visit.  Subjective Assessment - 06/12/19 0839    Subjective  Pt denies s/sx of dysphagia currently.    Currently in Pain?  Yes    Pain Score  2     Pain Location  Throat    Pain Orientation  Left    Pain Descriptors / Indicators  Sore    Pain Type  Acute pain  SLP Evaluation [facility] - 06/12/19 [id number]      SLP Visit Information   SLP Received On  06/12/19    Referring Provider (SLP)  Eppie Gibson, MD    Onset Date  Summer 2020    Medical Diagnosis  Glottis carcinoma       Subjective   Patient/Family Stated Goal  Maintain WNL swallow function      General Information   HPI  hoarseness began summer 2020 (around June). Biopsy 05-12-19 revealing well to mod differnetiated squamous cell carcinmoa which was p16 negative. Pt to receive 63 Gy/28 sessions to larynx. Pt started Monday 06-09-19.      Oral Motor/Sensory Function   Lingual Symmetry  Within Functional Limits    Lingual Strength  Within Functional Limits    Lingual Coordination  WFL      Motor Speech   Phonation  Hoarse   mild-mod      Pt currently tolerates regular diet/thin liquids. Pt  denies limiting certain food types or liquids. Pt's swallow deemed WNL at this time.   Because data states the risk for dysphagia during and after radiation treatment is high due to undergoing radiation tx, SLP taught pt about the possibility of reduced/limited ability for PO intake during rad tx. SLP encouraged pt to continue swallowing POs as far into rad tx as possible, even ingesting POs and/or completing HEP shortly after administration of pain meds.   SLP educated pt re: changes to swallowing musculature after rad tx, and why adherence to dysphagia HEP provided today and PO consumption was necessary to inhibit muscular disuse atrophy and to reduce muscle fibrosis following rad tx. Pt demonstrated understanding of these things to SLP.    SLP then developed a HEP for pt and pt was instructed how to perform exercises involving lingual, vocal, and pharyngeal strengthening. SLP performed each exercise and pt return demonstrated each exercise. SLP ensured pt performance was correct prior to moving on to next exercise. Pt was instructed to complete this program x2 times a day, until 6 months after his or her last rad tx, then x2 a week after that.                SLP Education - 06/12/19 1024    Education Details  HEP, late effects head/neck radiation on swallowing    Person(s) Educated  Patient    Methods  Explanation;Demonstration;Verbal cues;Handout    Comprehension  Verbalized understanding;Returned demonstration;Verbal cues required;Need further instruction       SLP Short Term Goals - 06/12/19 1029      SLP SHORT TERM GOAL #1   Title  Pt will complete HEP with rare min A over two sessions    Time  2    Period  --   sessoins, for all STGs   Status  New      SLP SHORT TERM GOAL #2   Title  Pt will tell SLP rationale for HEP completion over two sessions    Time  2    Status  New      SLP SHORT TERM GOAL #3   Title  Pt will tell SLP how a food journal can  hasten/facilitate return to more normalized diet    Time  2    Status  New      SLP SHORT TERM GOAL #4   Title  pt will tell SLP 3 overt s/sx of aspiraiton PNA over two sessions    Time  3    Status  New       SLP Long Term Goals - 06/12/19 1031      SLP LONG TERM GOAL #1   Title  Pt will complete HEP with modified independence over 3 visits    Time  5    Period  --   sessions, for all LTGs     SLP LONG TERM GOAL #2   Title  Pt will tell SLP when to decr frequency of HEP over two sessions    Time  6    Status  New       Plan - 06/12/19 1027    Clinical Impression Statement  Pt presents today with WNL/WFL swallowing ability pre-rad tx to glottis. Pt also exhibits mild-mod hoarse voice due to cancer of glottis. No overt s/sx aspiration PNA reported or observed today. Data suggests that as pts progress through rad or chemorad therapy that their swallowing ability will decrease. Also, WNL swallowing is threatened by muscle fibrosis that will likely develop after rad/chemorad is completed. Therefore, skilled ST would be beneficial to the pt in order to regularly assess pt's safety with POs and/or need for instrumental swallow assessment, as well as to assess proper completion of HEP.    Speech Therapy Frequency  --   approx once/ 4 weeks   Duration  --   6 visits   Treatment/Interventions  Aspiration precaution training;Pharyngeal strengthening exercises;Diet toleration management by SLP;Trials of upgraded texture/liquids;SLP instruction and feedback;Patient/family education;Compensatory strategies    Potential to Achieve Goals  Good    SLP Home Exercise Plan  provided today    Consulted and Agree with Plan of Care  Patient       Patient will benefit from skilled therapeutic intervention in order to improve the following deficits and impairments:   Dysphagia, unspecified type  Hoarseness of voice    Problem List Patient Active Problem List   Diagnosis Date Noted  . Glottis  carcinoma ([place]) 06/03/2019  . Encounter for tobacco use cessation counseling 11/01/2017  . Pseudoarthrosis of lumbar spine 01/21/2016  . Lumbar spine scoliosis 06/05/2014  . Herniated lumbar disc without myelopathy 07/11/2013  . Tobacco abuse 09/25/2011  . Gout 05/27/2009  . VITAMIN B12 DEFICIENCY 09/08/2008  . HLD (hyperlipidemia) 09/08/2008  . COLONIC POLYPS, HX OF 09/08/2008  . Anemia, unspecified 06/30/2008  . Anxiety disorder 06/29/2008  . Essential hypertension 06/29/2008  . GERD 06/29/2008  . Diverticulosis of colon 06/29/2008  . ARTHRITIS 06/29/2008  . HERNIATED DISC 06/29/2008    [facility] ,MS, CCC-SLP  06/12/2019, 10:42 AM  [facility] [address] [place], [place], [address] Phone: [phone]   Fax:  [phone]  Name: Aaron Black MRN: [id number] Date of Birth: [birth year]

## 2019-06-13 ENCOUNTER — Ambulatory Visit
Admission: RE | Admit: 2019-06-13 | Discharge: 2019-06-13 | Disposition: A | Discharge status: Home / Self Care | Payer: No Typology Code available for payment source | Source: Ambulatory Visit | Attending: Radiation Oncology | Admitting: Radiation Oncology

## 2019-06-13 ENCOUNTER — Other Ambulatory Visit: Payer: Self-pay

## 2019-06-13 DIAGNOSIS — R490 Dysphonia: Secondary | ICD-10-CM | POA: Diagnosis not present

## 2019-06-13 DIAGNOSIS — R1310 Dysphagia, unspecified: Secondary | ICD-10-CM | POA: Diagnosis not present

## 2019-06-13 DIAGNOSIS — C320 Malignant neoplasm of glottis: Secondary | ICD-10-CM | POA: Diagnosis not present

## 2019-06-15 ENCOUNTER — Other Ambulatory Visit: Payer: Self-pay

## 2019-06-15 ENCOUNTER — Ambulatory Visit
Admission: RE | Admit: 2019-06-15 | Discharge: 2019-06-15 | Disposition: A | Discharge status: Home / Self Care | Payer: No Typology Code available for payment source | Source: Ambulatory Visit | Attending: Radiation Oncology | Admitting: Radiation Oncology

## 2019-06-15 DIAGNOSIS — R490 Dysphonia: Secondary | ICD-10-CM | POA: Diagnosis not present

## 2019-06-15 DIAGNOSIS — C320 Malignant neoplasm of glottis: Secondary | ICD-10-CM | POA: Diagnosis not present

## 2019-06-15 DIAGNOSIS — R1310 Dysphagia, unspecified: Secondary | ICD-10-CM | POA: Diagnosis not present

## 2019-06-16 ENCOUNTER — Ambulatory Visit
Admission: RE | Admit: 2019-06-16 | Discharge: 2019-06-16 | Disposition: A | Discharge status: Home / Self Care | Payer: No Typology Code available for payment source | Source: Ambulatory Visit | Attending: Radiation Oncology | Admitting: Radiation Oncology

## 2019-06-16 ENCOUNTER — Telehealth: Payer: Self-pay | Admitting: Adult Health

## 2019-06-16 ENCOUNTER — Other Ambulatory Visit: Payer: Self-pay

## 2019-06-16 DIAGNOSIS — R1310 Dysphagia, unspecified: Secondary | ICD-10-CM | POA: Diagnosis not present

## 2019-06-16 DIAGNOSIS — R490 Dysphonia: Secondary | ICD-10-CM | POA: Diagnosis not present

## 2019-06-16 DIAGNOSIS — C320 Malignant neoplasm of glottis: Secondary | ICD-10-CM | POA: Diagnosis not present

## 2019-06-16 NOTE — Telephone Encounter (Signed)
Copied from [place] [id number]. Topic: General - Other >> Jun 16, 2019  2:46 PM Keene Breath wrote: Reason for CRM: Patient's wife called to ask the nurse why the patient was billed for his flu shot for a person 65+, when the patient is just 40 yrs. Old.  Please advise and call to explain.  CB# [phone]

## 2019-06-16 NOTE — Telephone Encounter (Signed)
We must bill what was administered to the patient.

## 2019-06-16 NOTE — Telephone Encounter (Signed)
It does appear patient was given a high dose. I will address the clinical portion of it. But we can we do about the billing portion

## 2019-06-16 NOTE — Telephone Encounter (Signed)
That is what is charted. This would be a clinical call, not billing.

## 2019-06-17 ENCOUNTER — Ambulatory Visit
Admission: RE | Admit: 2019-06-17 | Discharge: 2019-06-17 | Disposition: A | Discharge status: Home / Self Care | Payer: No Typology Code available for payment source | Source: Ambulatory Visit | Attending: Radiation Oncology | Admitting: Radiation Oncology

## 2019-06-17 ENCOUNTER — Other Ambulatory Visit: Payer: Self-pay

## 2019-06-17 DIAGNOSIS — R490 Dysphonia: Secondary | ICD-10-CM | POA: Diagnosis not present

## 2019-06-17 DIAGNOSIS — C320 Malignant neoplasm of glottis: Secondary | ICD-10-CM | POA: Diagnosis not present

## 2019-06-17 DIAGNOSIS — R1310 Dysphagia, unspecified: Secondary | ICD-10-CM | POA: Diagnosis not present

## 2019-06-18 ENCOUNTER — Other Ambulatory Visit: Payer: Self-pay

## 2019-06-18 ENCOUNTER — Ambulatory Visit
Admission: RE | Admit: 2019-06-18 | Discharge: 2019-06-18 | Disposition: A | Discharge status: Home / Self Care | Payer: No Typology Code available for payment source | Source: Ambulatory Visit | Attending: Radiation Oncology | Admitting: Radiation Oncology

## 2019-06-18 DIAGNOSIS — R1310 Dysphagia, unspecified: Secondary | ICD-10-CM | POA: Diagnosis not present

## 2019-06-18 DIAGNOSIS — C320 Malignant neoplasm of glottis: Secondary | ICD-10-CM | POA: Diagnosis not present

## 2019-06-18 DIAGNOSIS — R490 Dysphonia: Secondary | ICD-10-CM | POA: Diagnosis not present

## 2019-06-23 ENCOUNTER — Other Ambulatory Visit: Payer: Self-pay

## 2019-06-23 ENCOUNTER — Other Ambulatory Visit: Payer: Self-pay | Admitting: Adult Health

## 2019-06-23 ENCOUNTER — Ambulatory Visit
Admission: RE | Admit: 2019-06-23 | Discharge: 2019-06-23 | Disposition: A | Discharge status: Home / Self Care | Payer: No Typology Code available for payment source | Source: Ambulatory Visit | Attending: Radiation Oncology | Admitting: Radiation Oncology

## 2019-06-23 DIAGNOSIS — R490 Dysphonia: Secondary | ICD-10-CM | POA: Diagnosis not present

## 2019-06-23 DIAGNOSIS — R1310 Dysphagia, unspecified: Secondary | ICD-10-CM | POA: Diagnosis not present

## 2019-06-23 DIAGNOSIS — C320 Malignant neoplasm of glottis: Secondary | ICD-10-CM | POA: Diagnosis not present

## 2019-06-24 ENCOUNTER — Other Ambulatory Visit: Payer: Self-pay

## 2019-06-24 ENCOUNTER — Ambulatory Visit
Admission: RE | Admit: 2019-06-24 | Discharge: 2019-06-24 | Disposition: A | Discharge status: Home / Self Care | Payer: No Typology Code available for payment source | Source: Ambulatory Visit | Attending: Radiation Oncology | Admitting: Radiation Oncology

## 2019-06-24 DIAGNOSIS — R490 Dysphonia: Secondary | ICD-10-CM | POA: Diagnosis not present

## 2019-06-24 DIAGNOSIS — C320 Malignant neoplasm of glottis: Secondary | ICD-10-CM | POA: Insufficient documentation

## 2019-06-24 DIAGNOSIS — R1310 Dysphagia, unspecified: Secondary | ICD-10-CM | POA: Diagnosis not present

## 2019-06-25 ENCOUNTER — Inpatient Hospital Stay: Payer: No Typology Code available for payment source | Admitting: Nutrition

## 2019-06-25 ENCOUNTER — Other Ambulatory Visit: Payer: Self-pay

## 2019-06-25 ENCOUNTER — Ambulatory Visit
Admission: RE | Admit: 2019-06-25 | Discharge: 2019-06-25 | Disposition: A | Discharge status: Home / Self Care | Payer: No Typology Code available for payment source | Source: Ambulatory Visit | Attending: Radiation Oncology | Admitting: Radiation Oncology

## 2019-06-25 DIAGNOSIS — M1A09X0 Idiopathic chronic gout, multiple sites, without tophus (tophi): Secondary | ICD-10-CM | POA: Diagnosis not present

## 2019-06-25 DIAGNOSIS — C140 Malignant neoplasm of pharynx, unspecified: Secondary | ICD-10-CM | POA: Diagnosis not present

## 2019-06-25 DIAGNOSIS — M5030 Other cervical disc degeneration, unspecified cervical region: Secondary | ICD-10-CM | POA: Diagnosis not present

## 2019-06-25 DIAGNOSIS — M150 Primary generalized (osteo)arthritis: Secondary | ICD-10-CM | POA: Diagnosis not present

## 2019-06-25 DIAGNOSIS — C320 Malignant neoplasm of glottis: Secondary | ICD-10-CM | POA: Diagnosis not present

## 2019-06-25 DIAGNOSIS — R1310 Dysphagia, unspecified: Secondary | ICD-10-CM | POA: Diagnosis not present

## 2019-06-25 DIAGNOSIS — R490 Dysphonia: Secondary | ICD-10-CM | POA: Diagnosis not present

## 2019-06-25 NOTE — Progress Notes (Signed)
Nutrition follow up completed with patient over the telephone. He is receiving treatment for Larynx cancer. Patient reports his weight is improved and documented as 179 pounds, increased from 172.2 pounds. Patient reports his appetite is good and he is eating well. He has been trying to eat frequently throughout the day. No current nutrition impact symptoms.  Nutrition Diagnosis: Food and Nutrition related knowledge deficit continues.  Intervention: Continue to consume small frequent meals and snacks with high calorie and high protein foods. Consider nutrition supplements. Continue soft foods.  Monitoring, Evaluation, Goals: Patient will tolerate adequate calories and protein to minimize weight loss.  Next Visit: Wednesday, Dec 9 on the telephone.

## 2019-06-26 ENCOUNTER — Other Ambulatory Visit: Payer: Self-pay

## 2019-06-26 ENCOUNTER — Ambulatory Visit
Admission: RE | Admit: 2019-06-26 | Discharge: 2019-06-26 | Disposition: A | Discharge status: Home / Self Care | Payer: No Typology Code available for payment source | Source: Ambulatory Visit | Attending: Radiation Oncology | Admitting: Radiation Oncology

## 2019-06-26 DIAGNOSIS — R490 Dysphonia: Secondary | ICD-10-CM | POA: Diagnosis not present

## 2019-06-26 DIAGNOSIS — R1310 Dysphagia, unspecified: Secondary | ICD-10-CM | POA: Diagnosis not present

## 2019-06-26 DIAGNOSIS — C320 Malignant neoplasm of glottis: Secondary | ICD-10-CM | POA: Diagnosis not present

## 2019-06-27 ENCOUNTER — Ambulatory Visit
Admission: RE | Admit: 2019-06-27 | Discharge: 2019-06-27 | Disposition: A | Discharge status: Home / Self Care | Payer: No Typology Code available for payment source | Source: Ambulatory Visit | Attending: Radiation Oncology | Admitting: Radiation Oncology

## 2019-06-27 ENCOUNTER — Other Ambulatory Visit: Payer: Self-pay

## 2019-06-27 DIAGNOSIS — C320 Malignant neoplasm of glottis: Secondary | ICD-10-CM | POA: Diagnosis not present

## 2019-06-27 DIAGNOSIS — R1310 Dysphagia, unspecified: Secondary | ICD-10-CM | POA: Diagnosis not present

## 2019-06-27 DIAGNOSIS — R490 Dysphonia: Secondary | ICD-10-CM | POA: Diagnosis not present

## 2019-06-30 ENCOUNTER — Other Ambulatory Visit: Payer: Self-pay

## 2019-06-30 ENCOUNTER — Ambulatory Visit
Admission: RE | Admit: 2019-06-30 | Discharge: 2019-06-30 | Disposition: A | Discharge status: Home / Self Care | Payer: No Typology Code available for payment source | Source: Ambulatory Visit | Attending: Radiation Oncology | Admitting: Radiation Oncology

## 2019-06-30 DIAGNOSIS — R1310 Dysphagia, unspecified: Secondary | ICD-10-CM | POA: Diagnosis not present

## 2019-06-30 DIAGNOSIS — R490 Dysphonia: Secondary | ICD-10-CM | POA: Diagnosis not present

## 2019-06-30 DIAGNOSIS — C320 Malignant neoplasm of glottis: Secondary | ICD-10-CM | POA: Diagnosis not present

## 2019-07-01 ENCOUNTER — Other Ambulatory Visit: Payer: Self-pay

## 2019-07-01 ENCOUNTER — Ambulatory Visit
Admission: RE | Admit: 2019-07-01 | Discharge: 2019-07-01 | Disposition: A | Discharge status: Home / Self Care | Payer: No Typology Code available for payment source | Source: Ambulatory Visit | Attending: Radiation Oncology | Admitting: Radiation Oncology

## 2019-07-01 DIAGNOSIS — R1310 Dysphagia, unspecified: Secondary | ICD-10-CM | POA: Diagnosis not present

## 2019-07-01 DIAGNOSIS — R490 Dysphonia: Secondary | ICD-10-CM | POA: Diagnosis not present

## 2019-07-01 DIAGNOSIS — C320 Malignant neoplasm of glottis: Secondary | ICD-10-CM | POA: Diagnosis not present

## 2019-07-02 ENCOUNTER — Other Ambulatory Visit: Payer: Self-pay

## 2019-07-02 ENCOUNTER — Ambulatory Visit
Admission: RE | Admit: 2019-07-02 | Discharge: 2019-07-02 | Disposition: A | Discharge status: Home / Self Care | Payer: No Typology Code available for payment source | Source: Ambulatory Visit | Attending: Radiation Oncology | Admitting: Radiation Oncology

## 2019-07-02 ENCOUNTER — Inpatient Hospital Stay: Payer: No Typology Code available for payment source | Admitting: Nutrition

## 2019-07-02 DIAGNOSIS — C320 Malignant neoplasm of glottis: Secondary | ICD-10-CM | POA: Diagnosis not present

## 2019-07-02 DIAGNOSIS — R490 Dysphonia: Secondary | ICD-10-CM | POA: Diagnosis not present

## 2019-07-02 DIAGNOSIS — R1310 Dysphagia, unspecified: Secondary | ICD-10-CM | POA: Diagnosis not present

## 2019-07-02 NOTE — Progress Notes (Signed)
Nutrition follow-up completed with patient over the telephone. He is receiving radiation treatment for [place] cancer. Weight was documented as 183.6 pounds December 7.  This is increased from 179 pounds last week. Reports his saliva is getting thicker and sometimes he gets choked.   States his throat is very sore however he denies difficulty eating. He reports increasing fatigue.  Nutrition diagnosis: Food and nutrition related knowledge deficit continues.  Intervention: Patient educated to continue baking soda and salt water rinses frequently to improve thick saliva. Encourage patient to consider adding an oral nutrition supplement if oral intake decreases.  Monitoring, evaluation, goals: Patient will tolerate adequate calories and protein for minimal weight loss.  Next visit: Wednesday, December 16.

## 2019-07-03 ENCOUNTER — Other Ambulatory Visit: Payer: Self-pay

## 2019-07-03 ENCOUNTER — Ambulatory Visit
Admission: RE | Admit: 2019-07-03 | Discharge: 2019-07-03 | Disposition: A | Discharge status: Home / Self Care | Payer: No Typology Code available for payment source | Source: Ambulatory Visit | Attending: Radiation Oncology | Admitting: Radiation Oncology

## 2019-07-03 DIAGNOSIS — R1310 Dysphagia, unspecified: Secondary | ICD-10-CM | POA: Diagnosis not present

## 2019-07-03 DIAGNOSIS — R490 Dysphonia: Secondary | ICD-10-CM | POA: Diagnosis not present

## 2019-07-03 DIAGNOSIS — C320 Malignant neoplasm of glottis: Secondary | ICD-10-CM | POA: Diagnosis not present

## 2019-07-04 ENCOUNTER — Other Ambulatory Visit: Payer: Self-pay

## 2019-07-04 ENCOUNTER — Ambulatory Visit
Admission: RE | Admit: 2019-07-04 | Discharge: 2019-07-04 | Disposition: A | Discharge status: Home / Self Care | Payer: No Typology Code available for payment source | Source: Ambulatory Visit | Attending: Radiation Oncology | Admitting: Radiation Oncology

## 2019-07-04 DIAGNOSIS — C320 Malignant neoplasm of glottis: Secondary | ICD-10-CM | POA: Diagnosis not present

## 2019-07-04 DIAGNOSIS — R1310 Dysphagia, unspecified: Secondary | ICD-10-CM | POA: Diagnosis not present

## 2019-07-04 DIAGNOSIS — R490 Dysphonia: Secondary | ICD-10-CM | POA: Diagnosis not present

## 2019-07-07 ENCOUNTER — Other Ambulatory Visit: Payer: Self-pay

## 2019-07-07 ENCOUNTER — Ambulatory Visit
Admission: RE | Admit: 2019-07-07 | Discharge: 2019-07-07 | Disposition: A | Discharge status: Home / Self Care | Payer: No Typology Code available for payment source | Source: Ambulatory Visit | Attending: Radiation Oncology | Admitting: Radiation Oncology

## 2019-07-07 ENCOUNTER — Other Ambulatory Visit: Payer: Self-pay | Admitting: Radiation Oncology

## 2019-07-07 DIAGNOSIS — C320 Malignant neoplasm of glottis: Secondary | ICD-10-CM

## 2019-07-07 DIAGNOSIS — R1310 Dysphagia, unspecified: Secondary | ICD-10-CM | POA: Diagnosis not present

## 2019-07-07 DIAGNOSIS — R490 Dysphonia: Secondary | ICD-10-CM | POA: Diagnosis not present

## 2019-07-07 MED ORDER — LIDOCAINE VISCOUS HCL 2 % MT SOLN: OROMUCOSAL | 3 refills | Status: DC

## 2019-07-08 ENCOUNTER — Ambulatory Visit
Admission: RE | Admit: 2019-07-08 | Discharge: 2019-07-08 | Disposition: A | Discharge status: Home / Self Care | Payer: No Typology Code available for payment source | Source: Ambulatory Visit | Attending: Radiation Oncology | Admitting: Radiation Oncology

## 2019-07-08 ENCOUNTER — Other Ambulatory Visit: Payer: Self-pay

## 2019-07-08 DIAGNOSIS — R1310 Dysphagia, unspecified: Secondary | ICD-10-CM | POA: Diagnosis not present

## 2019-07-08 DIAGNOSIS — C320 Malignant neoplasm of glottis: Secondary | ICD-10-CM | POA: Diagnosis not present

## 2019-07-08 DIAGNOSIS — R490 Dysphonia: Secondary | ICD-10-CM | POA: Diagnosis not present

## 2019-07-09 ENCOUNTER — Inpatient Hospital Stay: Payer: No Typology Code available for payment source | Admitting: Nutrition

## 2019-07-09 ENCOUNTER — Other Ambulatory Visit: Payer: Self-pay

## 2019-07-09 ENCOUNTER — Ambulatory Visit
Admission: RE | Admit: 2019-07-09 | Discharge: 2019-07-09 | Disposition: A | Discharge status: Home / Self Care | Payer: No Typology Code available for payment source | Source: Ambulatory Visit | Attending: Radiation Oncology | Admitting: Radiation Oncology

## 2019-07-09 ENCOUNTER — Ambulatory Visit: Payer: No Typology Code available for payment source | Attending: Adult Health

## 2019-07-09 DIAGNOSIS — R1310 Dysphagia, unspecified: Secondary | ICD-10-CM | POA: Diagnosis not present

## 2019-07-09 DIAGNOSIS — R490 Dysphonia: Secondary | ICD-10-CM | POA: Diagnosis not present

## 2019-07-09 DIAGNOSIS — C320 Malignant neoplasm of glottis: Secondary | ICD-10-CM | POA: Diagnosis not present

## 2019-07-09 MED ORDER — SONAFINE EX EMUL
1.0000 "application " | Freq: Once | CUTANEOUS | Status: AC
  Administered 2019-07-09: 1 via TOPICAL

## 2019-07-09 NOTE — Patient Instructions (Signed)
Begin to add back in the swallowing (Effortful and Mendelsohn) exercises as you begin to swallow more without pain, or are without pain by taking any form of pain meds.

## 2019-07-09 NOTE — Progress Notes (Signed)
Nutrition follow up completed with patient on the telephone. He is receiving radiation therapy for larynx cancer. Weight documented as 181 pounds on Dec 14. Patient reports his pain is not controlled at nighttime and it is keeping him awake. He does not have pain medication. Patient is requesting some additional samples of Carnation breakfast essentials, boost, and Ensure.  Nutrition diagnosis: Food and nutrition related knowledge deficit continues.  Intervention: Educated patient to continue strategies for choosing soft moist foods for easier swallowing. Recommended he continue oral nutrition supplements. I will provide samples for him to pick up tomorrow when he comes for radiation therapy. Encouraged him to discuss pain control with his physician. Encouraged continued weight stabilization.  Monitoring, evaluation, goals: Patient will continue to minimize weight loss throughout treatment.  Next visit: Wednesday, December 23 by telephone.  **Disclaimer: This note was dictated with voice recognition software. Similar sounding words can inadvertently be transcribed and this note may contain transcription errors which may not have been corrected upon publication of note.**

## 2019-07-09 NOTE — Therapy (Signed)
[facility] [address] [place], [place], [address] Phone: [phone]   Fax:  [phone]  Speech Language Pathology Treatment  Patient Details  Name: Aaron Black MRN: [id number] Date of Birth: Dec 13, 1955 Referring Provider (SLP): Eppie Gibson, MD   Encounter Date: 07/09/2019  End of Session - 07/09/19 1303    Visit Number  2    Number of Visits  7    Date for SLP Re-Evaluation  12/12/19    SLP Start Time  1017    SLP Stop Time   [id number]    SLP Time Calculation (min)  30 min    Activity Tolerance  Patient tolerated treatment well       Past Medical History:  Diagnosis Date  . Anemia   . Anxiety   . Arthritis   . B12 deficiency   . Cellulitis of arm, left 10/2017   after cat scratch  . Cervical disc disease    a. 07/2003 ant cervical deomcpression and fusion C5-6/C6-7;  b. 09/2007 post cervical laminectomy C4-5 with scres and arthrodesis C4-C7.  Marland Kitchen Chest pain at rest 02/25/2013  . Chronic lower back pain   . Complication of anesthesia    " i WOKE UP DURING A COLONOSCOPY "- 2008  . Depression   . Diverticulosis of colon   . GERD (gastroesophageal reflux disease)   . Gout   . Hyperlipidemia   . Hypertension   . Pleurisy   . Shortness of breath dyspnea    with exertion- "I cant exercise now"  . Tobacco abuse    a. 40 yrs, 1.5-3 ppd over that time.    Past Surgical History:  Procedure Laterality Date  . ANTERIOR LATERAL LUMBAR FUSION 4 LEVELS Right 06/05/2014   Procedure: ANTERIOR LATERAL LUMBAR FUSION  LUMBAR ONE TO LUMBAR FIVE with Percutaneous Pedicle Screws;  Surgeon: Erline Levine, MD;  Location: [facility];  Service: Neurosurgery;  Laterality: Right;  . CARPAL TUNNEL RELEASE Right   . CERVICAL DISC SURGERY     X 2  . COLONOSCOPY    . ELBOW ARTHROSCOPY Right   . KNEE ARTHROSCOPY Right 08/2013   torn menicus  . KNEE ARTHROSCOPY Left 11/2013   chip cartlidge,torn menicus  . LUMBAR  LAMINECTOMY/DECOMPRESSION MICRODISCECTOMY Right 07/11/2013   Procedure: LUMBAR LAMINECTOMY/DECOMPRESSION MICRODISCECTOMY 1 LEVEL;  Surgeon: Erline Levine, MD;  Location: [facility];  Service: Neurosurgery;  Laterality: Right;  Right L34 microdiskectomy  . LUMBAR PERCUTANEOUS PEDICLE SCREW 4 LEVEL N/A 06/05/2014   Procedure: LUMBAR PERCUTANEOUS PEDICLE SCREW 4 LEVEL;  Surgeon: Erline Levine, MD;  Location: [facility];  Service: Neurosurgery;  Laterality: N/A;  . MICROLARYNGOSCOPY Left 05/12/2019   Procedure: MICROLARYNGOSCOPY WITH BIOPSY;  Surgeon: Rozetta Nunnery, MD;  Location: [facility];  Service: ENT;  Laterality: Left;  . NECK SURGERY  January 2005, March 2009   C-Spine  . ROTATOR CUFF REPAIR  August 2000, January 2002, July 2008, July 2009   2 left 2 right    There were no vitals filed for this visit.  Subjective Assessment - 07/09/19 1025    Subjective  "You're going to feel disappointed in me [name]. I haven't done what you've asked me to do."    Currently in Pain?  Yes    Pain Score  8     Pain Location  Throat    Pain Orientation  Left;Right    Pain Descriptors / Indicators  Sore  ADULT SLP TREATMENT - 07/09/19 1028      General Information   Behavior/Cognition  Alert;Cooperative;Pleasant mood      Treatment Provided   Treatment provided  Dysphagia      Dysphagia Treatment   Temperature Spikes Noted  No    Respiratory Status  Room air    Oral Cavity - Dentition  Adequate natural dentition    Treatment Methods  Patient/caregiver education;Skilled observation;Compensation strategy training    Patient observed directly with PO's  Yes    Type of PO's observed  Thin liquids    Oral Phase Signs & Symptoms  --   none noted   Pharyngeal Phase Signs & Symptoms  Other (comment)   with small sips - none   Other treatment/comments  Pt reports "getting strangled" every day in the last 3-4 days. "It feels like shards of glass whenever I  swallow now [name]. I'm sorry I didn't (do the HEP as prescribed)." SLP encouraged pt to talk with MD about pain meds. SLP req'd to provide min A usually (with 2/3) non-swallowing exercises. SLP told pt to do one rep of the swalloing (effortful and Mendelsohn) when pt can tolerate, instead of 10 reps of one of them and then have incr'd odynophagia for the other swallowing exercise. Pt communicated that he knows HEP is necessary for       Assessment / Recommendations / [place] with current plan of care      Progression Toward Goals   Progression toward goals  Progressing toward goals       SLP Education - 07/09/19 1302    Education Details  HEP procedure    Person(s) Educated  Patient    Methods  Explanation;Demonstration    Comprehension  Verbalized understanding;Returned demonstration;Need further instruction;Verbal cues required       SLP Short Term Goals - 07/09/19 1307      SLP SHORT TERM GOAL #1   Title  Pt will complete HEP with rare min A over two sessions    Baseline  07-09-19    Time  1    Period  --   sessoins, for all STGs   Status  On-going      SLP SHORT TERM GOAL #2   Title  Pt will tell SLP rationale for HEP completion over two sessions    Time  1    Status  On-going      SLP SHORT TERM GOAL #3   Title  Pt will tell SLP how a food journal can hasten/facilitate return to more normalized diet    Time  1    Status  On-going      SLP SHORT TERM GOAL #4   Title  pt will tell SLP 3 overt s/sx of aspiraiton PNA over two sessions    Time  2    Status  On-going       SLP Long Term Goals - 07/09/19 1308      SLP LONG TERM GOAL #1   Title  Pt will complete HEP with modified independence over 3 visits    Time  4    Period  --   sessions, for all LTGs   Status  On-going      SLP LONG TERM GOAL #2   Title  Pt will tell SLP when to decr frequency of HEP over two sessions    Time  5    Status  On-going  Plan - 07/09/19 1304    Clinical  Impression Statement  Pt presents today with some reported overt s/sx pharyngeal dysphagia with liquids. When SLP had pt take small sips no overt s/sx aspiration were observed by SLP. Pt also cont mild hoarse voice due to cancer of glottis. Data suggest that as pts progress through rad or chemorad therapy that their swallowing ability will decrease. Also, WNL swallowing is threatened by muscle fibrosis that will likely develop after rad/chemorad is completed. Therefore, skilled ST would be beneficial to the pt in order to regularly assess pt's safety with POs and/or need for instrumental swallow assessment, as well as to assess proper completion of HEP.    Speech Therapy Frequency  --   approx once/ 4 weeks   Duration  --   6 visits   Treatment/Interventions  Aspiration precaution training;Pharyngeal strengthening exercises;Diet toleration management by SLP;Trials of upgraded texture/liquids;SLP instruction and feedback;Patient/family education;Compensatory strategies    Potential to Achieve Goals  Good    SLP Home Exercise Plan  provided today    Consulted and Agree with Plan of Care  Patient       Patient will benefit from skilled therapeutic intervention in order to improve the following deficits and impairments:   Dysphagia, unspecified type  Hoarseness of voice    Problem List Patient Active Problem List   Diagnosis Date Noted  . Glottis carcinoma ([place]) 06/03/2019  . Encounter for tobacco use cessation counseling 11/01/2017  . Pseudoarthrosis of lumbar spine 01/21/2016  . Lumbar spine scoliosis 06/05/2014  . Herniated lumbar disc without myelopathy 07/11/2013  . Tobacco abuse 09/25/2011  . Gout 05/27/2009  . VITAMIN B12 DEFICIENCY 09/08/2008  . HLD (hyperlipidemia) 09/08/2008  . COLONIC POLYPS, HX OF 09/08/2008  . Anemia, unspecified 06/30/2008  . Anxiety disorder 06/29/2008  . Essential hypertension 06/29/2008  . GERD 06/29/2008  . Diverticulosis of colon 06/29/2008  .  ARTHRITIS 06/29/2008  . HERNIATED DISC 06/29/2008    [facility] ,[place], CCC-SLP  07/09/2019, 1:09 PM  [facility] [address] [place], [place], [address] Phone: [phone]   Fax:  [phone]   Name: Aaron Black MRN: [id number] Date of Birth: Sep 25, 1955

## 2019-07-10 ENCOUNTER — Other Ambulatory Visit: Payer: Self-pay

## 2019-07-10 ENCOUNTER — Ambulatory Visit
Admission: RE | Admit: 2019-07-10 | Discharge: 2019-07-10 | Disposition: A | Discharge status: Home / Self Care | Payer: No Typology Code available for payment source | Source: Ambulatory Visit | Attending: Radiation Oncology | Admitting: Radiation Oncology

## 2019-07-10 DIAGNOSIS — R1310 Dysphagia, unspecified: Secondary | ICD-10-CM | POA: Diagnosis not present

## 2019-07-10 DIAGNOSIS — R490 Dysphonia: Secondary | ICD-10-CM | POA: Diagnosis not present

## 2019-07-10 DIAGNOSIS — C320 Malignant neoplasm of glottis: Secondary | ICD-10-CM | POA: Diagnosis not present

## 2019-07-11 ENCOUNTER — Ambulatory Visit
Admission: RE | Admit: 2019-07-11 | Discharge: 2019-07-11 | Disposition: A | Discharge status: Home / Self Care | Payer: No Typology Code available for payment source | Source: Ambulatory Visit | Attending: Radiation Oncology | Admitting: Radiation Oncology

## 2019-07-11 ENCOUNTER — Other Ambulatory Visit: Payer: Self-pay

## 2019-07-11 DIAGNOSIS — R1310 Dysphagia, unspecified: Secondary | ICD-10-CM | POA: Diagnosis not present

## 2019-07-11 DIAGNOSIS — C320 Malignant neoplasm of glottis: Secondary | ICD-10-CM | POA: Diagnosis not present

## 2019-07-11 DIAGNOSIS — R490 Dysphonia: Secondary | ICD-10-CM | POA: Diagnosis not present

## 2019-07-14 ENCOUNTER — Encounter: Payer: Self-pay | Admitting: Radiation Oncology

## 2019-07-14 ENCOUNTER — Other Ambulatory Visit: Payer: Self-pay | Admitting: Radiation Oncology

## 2019-07-14 ENCOUNTER — Other Ambulatory Visit: Payer: Self-pay

## 2019-07-14 ENCOUNTER — Ambulatory Visit
Admission: RE | Admit: 2019-07-14 | Discharge: 2019-07-14 | Disposition: A | Discharge status: Home / Self Care | Payer: No Typology Code available for payment source | Source: Ambulatory Visit | Attending: Radiation Oncology | Admitting: Radiation Oncology

## 2019-07-14 DIAGNOSIS — R1310 Dysphagia, unspecified: Secondary | ICD-10-CM | POA: Diagnosis not present

## 2019-07-14 DIAGNOSIS — R490 Dysphonia: Secondary | ICD-10-CM | POA: Diagnosis not present

## 2019-07-14 DIAGNOSIS — C320 Malignant neoplasm of glottis: Secondary | ICD-10-CM | POA: Diagnosis not present

## 2019-07-14 NOTE — Progress Notes (Signed)
I spoke with the patient today as he is completing his final week of radiotherapy.  He has severe pain in his skin and throat from radiation therapy treating his glottic cancer.  I gave him instructions on escalating his skin care with topical agents, and for his pain, we will need to treat this with narcotics.  He will continue taking lidocaine to soothe his throat and in addition to that I recommended that he take 1-2 Vicodin's 5/325 mg tablets every 4 hours not to exceed 12 tablets daily.  He also already has a prescription for this from his rheumatologist.  He says that he has at least 80 tablets but as of recently has only been taking a tablet or 2 daily.  He will take this more often as instructed by me and if he needs a refill as he is healing from radiotherapy he will let me know.  I will route this note to his rheumatologist.   -----------------------------------  Eppie Gibson, MD

## 2019-07-15 ENCOUNTER — Ambulatory Visit
Admission: RE | Admit: 2019-07-15 | Discharge: 2019-07-15 | Disposition: A | Discharge status: Home / Self Care | Payer: No Typology Code available for payment source | Source: Ambulatory Visit | Attending: Radiation Oncology | Admitting: Radiation Oncology

## 2019-07-15 ENCOUNTER — Other Ambulatory Visit: Payer: Self-pay

## 2019-07-15 DIAGNOSIS — R490 Dysphonia: Secondary | ICD-10-CM | POA: Diagnosis not present

## 2019-07-15 DIAGNOSIS — C320 Malignant neoplasm of glottis: Secondary | ICD-10-CM | POA: Diagnosis not present

## 2019-07-15 DIAGNOSIS — R1310 Dysphagia, unspecified: Secondary | ICD-10-CM | POA: Diagnosis not present

## 2019-07-16 ENCOUNTER — Inpatient Hospital Stay: Payer: No Typology Code available for payment source | Admitting: Nutrition

## 2019-07-16 ENCOUNTER — Ambulatory Visit
Admission: RE | Admit: 2019-07-16 | Discharge: 2019-07-16 | Disposition: A | Discharge status: Home / Self Care | Payer: No Typology Code available for payment source | Source: Ambulatory Visit | Attending: Radiation Oncology | Admitting: Radiation Oncology

## 2019-07-16 ENCOUNTER — Other Ambulatory Visit: Payer: Self-pay

## 2019-07-16 ENCOUNTER — Encounter: Payer: Self-pay | Admitting: *Deleted

## 2019-07-16 DIAGNOSIS — R490 Dysphonia: Secondary | ICD-10-CM | POA: Diagnosis not present

## 2019-07-16 DIAGNOSIS — R1310 Dysphagia, unspecified: Secondary | ICD-10-CM | POA: Diagnosis not present

## 2019-07-16 DIAGNOSIS — C320 Malignant neoplasm of glottis: Secondary | ICD-10-CM | POA: Diagnosis not present

## 2019-07-16 NOTE — Progress Notes (Signed)
Oncology Nurse Navigator Documentation  Met with Mr. Stay after RT this morning to provide support and to celebrate tomorrow's end of radiation treatment.   Provided verbal/written post-RT guidance:  Importance of keeping all follow-up appts, especially those with Nutrition and SLP.  Importance of protecting treatment area from sun.  Continuation of Sonafine application 2-3 times daily, application of abx ointment to areas of raw skin; when supply of Sonafine exhausted transition to OTC lotion with vitamin E. Provided/reviewed Epic calendar of upcoming appts. Explained my role as navigator will continue for several more months, I will be calling or joining him during follow-up visits.   He agreed to call me with needs/concerns.    Gayleen Orem, RN, BSN Head & Neck Oncology [place] [facility] [place] [phone]

## 2019-07-16 NOTE — Progress Notes (Signed)
I attempted to call patient today and follow-up with him regarding his nutrition intake.  Unfortunately he was not available but I was able to leave a message with my name and phone number.  Asked patient to contact me if he has questions or concerns.

## 2019-07-17 ENCOUNTER — Other Ambulatory Visit: Payer: Self-pay

## 2019-07-17 ENCOUNTER — Ambulatory Visit
Admission: RE | Admit: 2019-07-17 | Discharge: 2019-07-17 | Disposition: A | Discharge status: Home / Self Care | Payer: No Typology Code available for payment source | Source: Ambulatory Visit | Attending: Radiation Oncology | Admitting: Radiation Oncology

## 2019-07-17 ENCOUNTER — Ambulatory Visit: Payer: No Typology Code available for payment source

## 2019-07-17 ENCOUNTER — Encounter: Payer: Self-pay | Admitting: Radiation Oncology

## 2019-07-17 DIAGNOSIS — R490 Dysphonia: Secondary | ICD-10-CM | POA: Diagnosis not present

## 2019-07-17 DIAGNOSIS — R1310 Dysphagia, unspecified: Secondary | ICD-10-CM | POA: Diagnosis not present

## 2019-07-17 DIAGNOSIS — C320 Malignant neoplasm of glottis: Secondary | ICD-10-CM | POA: Diagnosis not present

## 2019-07-21 ENCOUNTER — Ambulatory Visit: Payer: No Typology Code available for payment source

## 2019-08-01 ENCOUNTER — Encounter: Payer: Self-pay | Admitting: Radiation Oncology

## 2019-08-01 ENCOUNTER — Telehealth: Payer: Self-pay | Admitting: *Deleted

## 2019-08-01 ENCOUNTER — Ambulatory Visit
Admission: RE | Admit: 2019-08-01 | Discharge: 2019-08-01 | Disposition: A | Discharge status: Home / Self Care | Payer: No Typology Code available for payment source | Source: Ambulatory Visit | Attending: Radiation Oncology | Admitting: Radiation Oncology

## 2019-08-01 DIAGNOSIS — C320 Malignant neoplasm of glottis: Secondary | ICD-10-CM

## 2019-08-01 DIAGNOSIS — C140 Malignant neoplasm of pharynx, unspecified: Secondary | ICD-10-CM | POA: Insufficient documentation

## 2019-08-01 DIAGNOSIS — S61219A Laceration without foreign body of unspecified finger without damage to nail, initial encounter: Secondary | ICD-10-CM | POA: Diagnosis not present

## 2019-08-01 NOTE — Telephone Encounter (Signed)
CALLED PATIENT TO INFORM OF FU APPT. WITH DR. Isidore Moos ON 04-02-20 @ 11 AM, SPOKE WITH PATIENT AND HE IS AWARE OF THIS APPT.

## 2019-08-01 NOTE — Progress Notes (Signed)
Radiation Oncology         [phone] ________________________________  Name: Aaron Black MRN: [id number]  Date: 08/01/2019  DOB: [birth year]  Follow-Up Visit Note by telephone (patient did not have access to MyChart video during pandemic precautions)   CC: Aaron Black, Aaron Rumps, NP  Aaron Black, *  Diagnosis and Prior Radiotherapy:       ICD-10-CM   1. Glottis carcinoma ([place])  C32.0     CHIEF COMPLAINT:  Here for follow-up and surveillance of throat cancer  Narrative:   He is doing well. Pain is much better. Eating, drinking well. Skin healing nicely.  Applying lotion to neck.  Pleased with progress in healing. Late February visit scheduled with Dr Lucia Gaskins ENT.             ALLERGIES:  is allergic to lisinopril and percodan [oxycodone-aspirin].  Meds: Current Outpatient Medications  Medication Sig Dispense Refill  . albuterol (VENTOLIN HFA) 108 (90 Base) MCG/ACT inhaler Inhale 2 puffs into the lungs every 6 (six) hours as needed for wheezing or shortness of breath. 8 g 0  . allopurinol (ZYLOPRIM) 100 MG tablet TAKE 1 TABLET BY MOUTH EVERY DAY 90 tablet 1  . ALPRAZolam (XANAX) 0.5 MG tablet TAKE 1 TABLET BY MOUTH THREE TIMES A DAY AS NEEDED FOR ANXIETY 90 tablet 1  . atorvastatin (LIPITOR) 40 MG tablet Take 1 tablet (40 mg total) by mouth daily. 90 tablet 3  . colchicine 0.6 MG tablet TAKE 1 TABLET (0.6 MG TOTAL) BY MOUTH DAILY AS NEEDED (GOUT). 90 tablet 1  . cyclobenzaprine (FLEXERIL) 10 MG tablet Take 10 mg by mouth daily.    . diclofenac sodium (VOLTAREN) 1 % GEL Apply 2 g topically as needed (for pain).   0  . diltiazem (CARDIZEM CD) 360 MG 24 hr capsule TAKE 1 CAPSULE BY MOUTH EVERY DAY 90 capsule 0  . HYDROcodone-acetaminophen (NORCO/VICODIN) 5-325 MG tablet Take 1 tablet by mouth every 8 (eight) hours as needed for moderate pain.   0  . lidocaine (XYLOCAINE) 2 % solution Patient: Mix 1part 2% viscous lidocaine, 1part H20. Swish & swallow 6mL of diluted mixture, 17min  before meals and at bedtime, up to QID 300 mL 3  . losartan (COZAAR) 100 MG tablet Take 1 tablet (100 mg total) by mouth daily. 90 tablet 1  . omeprazole (PRILOSEC OTC) 20 MG tablet Take 2 tablets (40 mg total) by mouth daily.    . sertraline (ZOLOFT) 50 MG tablet Take 1 tablet (50 mg total) by mouth daily. 90 tablet 1  . valACYclovir (VALTREX) 1000 MG tablet Take 1 tablet (1,000 mg total) by mouth 3 (three) times daily. For shingles 21 tablet 0  . vitamin B-12 (CYANOCOBALAMIN) 1000 MCG tablet Take 1,000 mcg by mouth daily.     No current facility-administered medications for this encounter.    Physical Findings: The patient is in no acute distress. Patient is alert and oriented. Wt Readings from Last 3 Encounters:  06/04/19 172 lb 3.2 oz (78.1 kg)  05/12/19 169 lb 15.6 oz (77.1 kg)  01/24/19 178 lb (80.7 kg)    vitals were not taken for this visit. .  General: Alert and oriented, in no acute distress   Lab Findings: Lab Results  Component Value Date   WBC 7.4 01/24/2019   HGB 13.6 01/24/2019   HCT 40.6 01/24/2019   MCV 93.5 01/24/2019   PLT 246 01/24/2019    Lab Results  Component Value Date   TSH 0.63  09/18/2018    Radiographic Findings: No results found.  Impression/Plan:    1) Head and Neck Cancer Status: healing well from RT  2) Nutritional Status: denies issues PEG tube: none  3) Risk Factors: The patient has been educated about risk factors including alcohol and tobacco abuse; they understand that avoidance of alcohol and tobacco is important to prevent recurrences as well as other cancers. Abstaining from cigarettes, PCP conducting LDCT of lungs annually.  4) Swallowing: good function  5)  Thyroid function:  WNL, knowns to continue this lab annually w/ PCP as RT can increase risk of hypothyroidism. Lab Results  Component Value Date   TSH 0.63 09/18/2018    6) Other: followup with Dr Lucia Gaskins for surveillance as scheduled. Followup in rad/onc in 8 months.  The patient was encouraged to call with any issues or questions before then.  This encounter was provided by telemedicine platform by telephone as patient was unable to access MyChart video during pandemic precautions The patient has given verbal consent for this type of encounter and has been advised to only accept a meeting of this type in a secure network environment. The time spent during this encounter on date of service, in total, was 30 minutes. The attendants for this meeting include Eppie Gibson  and KIERAN RIMER.  During the encounter, Eppie Gibson was located at [facility] Radiation Oncology Department.  [name] was located at home.  _____________________________________   Eppie Gibson, MD

## 2019-08-12 ENCOUNTER — Other Ambulatory Visit: Payer: Self-pay

## 2019-08-12 ENCOUNTER — Ambulatory Visit: Payer: No Typology Code available for payment source | Attending: Adult Health

## 2019-08-12 DIAGNOSIS — R490 Dysphonia: Secondary | ICD-10-CM | POA: Diagnosis not present

## 2019-08-12 DIAGNOSIS — R1310 Dysphagia, unspecified: Secondary | ICD-10-CM | POA: Diagnosis not present

## 2019-08-12 NOTE — Therapy (Signed)
Center [address] [place], [place], [address] Phone: [phone]   Fax:  [phone]  Speech Language Pathology Treatment  Patient Details  Name: Aaron Black MRN: [id number] Date of Birth: 1955-08-17 Referring Provider (SLP): Eppie Gibson, MD   Encounter Date: 08/12/2019    Past Medical History:  Diagnosis Date  . Anemia   . Anxiety   . Arthritis   . B12 deficiency   . Cellulitis of arm, left 10/2017   after cat scratch  . Cervical disc disease    a. 07/2003 ant cervical deomcpression and fusion C5-6/C6-7;  b. 09/2007 post cervical laminectomy C4-5 with scres and arthrodesis C4-C7.  Marland Kitchen Chest pain at rest 02/25/2013  . Chronic lower back pain   . Complication of anesthesia    " i WOKE UP DURING A COLONOSCOPY "- 2008  . Depression   . Diverticulosis of colon   . GERD (gastroesophageal reflux disease)   . Gout   . Hyperlipidemia   . Hypertension   . Pleurisy   . Shortness of breath dyspnea    with exertion- "I cant exercise now"  . Tobacco abuse    a. 40 yrs, 1.5-3 ppd over that time.    Past Surgical History:  Procedure Laterality Date  . ANTERIOR LATERAL LUMBAR FUSION 4 LEVELS Right 06/05/2014   Procedure: ANTERIOR LATERAL LUMBAR FUSION  LUMBAR ONE TO LUMBAR FIVE with Percutaneous Pedicle Screws;  Surgeon: Erline Levine, MD;  Location: [place] [facility];  Service: Neurosurgery;  Laterality: Right;  . CARPAL TUNNEL RELEASE Right   . CERVICAL DISC SURGERY     X 2  . COLONOSCOPY    . ELBOW ARTHROSCOPY Right   . KNEE ARTHROSCOPY Right 08/2013   torn menicus  . KNEE ARTHROSCOPY Left 11/2013   chip cartlidge,torn menicus  . LUMBAR LAMINECTOMY/DECOMPRESSION MICRODISCECTOMY Right 07/11/2013   Procedure: LUMBAR LAMINECTOMY/DECOMPRESSION MICRODISCECTOMY 1 LEVEL;  Surgeon: Erline Levine, MD;  Location: [place] [facility];  Service: Neurosurgery;  Laterality: Right;  Right L34 microdiskectomy  . LUMBAR PERCUTANEOUS  PEDICLE SCREW 4 LEVEL N/A 06/05/2014   Procedure: LUMBAR PERCUTANEOUS PEDICLE SCREW 4 LEVEL;  Surgeon: Erline Levine, MD;  Location: [place] [facility];  Service: Neurosurgery;  Laterality: N/A;  . MICROLARYNGOSCOPY Left 05/12/2019   Procedure: MICROLARYNGOSCOPY WITH BIOPSY;  Surgeon: Rozetta Nunnery, MD;  Location: [place] [facility];  Service: ENT;  Laterality: Left;  . NECK SURGERY  January 2005, March 2009   C-Spine  . ROTATOR CUFF REPAIR  August 2000, January 2002, July 2008, July 2009   2 left 2 right    There were no vitals filed for this visit.  Subjective Assessment - 08/12/19 0851    Subjective  "I  haven't been coughing up any blood the last 4-5 days." Pt reports his friends are telling him that his voice sounds better.    Currently in Pain?  Yes    Pain Score  2     Pain Location  Throat    Pain Orientation  Right;Left            ADULT SLP TREATMENT - 08/12/19 0853      General Information   Behavior/Cognition  Alert;Cooperative;Pleasant mood      Treatment Provided   Treatment provided  Dysphagia      Dysphagia Treatment   Temperature Spikes Noted  No    Respiratory Status  Room air    Patient observed directly with PO's  Yes  Type of PO's observed  Thin liquids;Dysphagia 3 (soft)    Oral Phase Signs & Symptoms  Other (comment)   none noted   Pharyngeal Phase Signs & Symptoms  Other (comment)   None noted   Other treatment/comments  Pt had "dry cough" last night. Pt reports that he has been successful at getting exercises in BID. Pt reports getting choked twice last week on water but this is greatly reduced from previous session. With HEP, pt req'd occasional min A for procedure with vocal fold adduction and high pitch he. Pt is now eating regular diet but SLP reviewed eating journal anyway. SLP also shared overt s/sx aspiraiton PNA.       Assessment / Recommendations / Plan   Plan  Continue with current plan of care      Progression Toward Goals    Progression toward goals  Progressing toward goals         SLP Short Term Goals - 08/12/19 0906      SLP SHORT TERM GOAL #1   Title  Pt will complete HEP with rare min A over two sessions    Baseline  07-09-19    Status  Partially Met      SLP SHORT TERM GOAL #2   Title  Pt will tell SLP rationale for HEP completion over two sessions    Baseline  08-12-19    Status  Partially Met      SLP SHORT TERM GOAL #3   Title  Pt will tell SLP how a food journal can hasten/facilitate return to more normalized diet    Status  Achieved      SLP SHORT TERM GOAL #4   Title  pt will tell SLP 3 overt s/sx of aspiraiton PNA over two sessions    Baseline  08-12-19    Time  1    Status  On-going       SLP Long Term Goals - 08/12/19 0909      SLP LONG TERM GOAL #1   Title  Pt will complete HEP with modified independence over 3 visits    Time  3    Period  --   sessions, for all LTGs   Status  On-going      SLP LONG TERM GOAL #2   Title  Pt will tell SLP when to decr frequency of HEP over two sessions    Time  4    Status  On-going       Plan - 08/12/19 0910    Clinical Impression Statement  Pt presents today with greatly reduced overt s/sx pharyngeal dysphagia with liquids. Occasional min A necessary for HEP procedure. No overt s/sx today, with 8 sips and a cereal bar today. Pt voice sounds better today than last month. Data suggest that as pts progress through rad or chemorad therapy that their swallowing ability will decrease. Also, WNL swallowing is threatened by muscle fibrosis that will likely develop after rad/chemorad is completed. Therefore, skilled ST would be beneficial to the pt in order to regularly assess pt's safety with POs and/or need for instrumental swallow assessment, as well as to assess proper completion of HEP.    Speech Therapy Frequency  --   approx once/ 4 weeks   Duration  --   6 visits   Treatment/Interventions  Aspiration precaution training;Pharyngeal  strengthening exercises;Diet toleration management by SLP;Trials of upgraded texture/liquids;SLP instruction and feedback;Patient/family education;Compensatory strategies    Potential to Achieve Goals  Good  SLP Home Exercise Plan  provided today    Consulted and Agree with Plan of Care  Patient       Patient will benefit from skilled therapeutic intervention in order to improve the following deficits and impairments:   Dysphagia, unspecified type  Hoarseness of voice    Problem List Patient Active Problem List   Diagnosis Date Noted  . Glottis carcinoma ([place]) 06/03/2019  . Encounter for tobacco use cessation counseling 11/01/2017  . Pseudoarthrosis of lumbar spine 01/21/2016  . Lumbar spine scoliosis 06/05/2014  . Herniated lumbar disc without myelopathy 07/11/2013  . Tobacco abuse 09/25/2011  . Gout 05/27/2009  . VITAMIN B12 DEFICIENCY 09/08/2008  . HLD (hyperlipidemia) 09/08/2008  . COLONIC POLYPS, HX OF 09/08/2008  . Anemia, unspecified 06/30/2008  . Anxiety disorder 06/29/2008  . Essential hypertension 06/29/2008  . GERD 06/29/2008  . Diverticulosis of colon 06/29/2008  . ARTHRITIS 06/29/2008  . HERNIATED DISC 06/29/2008    [facility] ,MS, CCC-SLP  08/12/2019, 9:26 AM  [place] [address] [place], [place], [address] Phone: [phone]   Fax:  [phone]   Name: Aaron Black MRN: [id number] Date of Birth: 12/15/1955

## 2019-08-12 NOTE — Patient Instructions (Addendum)
   You've done great so far - keep up the exercises!!    ========================================== Signs of Aspiration Pneumonia   . Chest pain/tightness . Fever (can be low grade) . Cough  o With foul-smelling phlegm (sputum) o With sputum containing pus or blood o With greenish sputum . Fatigue  . Shortness of breath  . Wheezing   **IF YOU HAVE THESE SIGNS, CONTACT YOUR DOCTOR OR GO TO THE EMERGENCY DEPARTMENT OR URGENT CARE AS SOON AS POSSIBLE**

## 2019-08-19 NOTE — Progress Notes (Signed)
  Patient Name: Aaron Black MRN: [id number] DOB: 07-29-1955 Referring Physician: [name] (Profile Not Attached) Date of Service: 07/17/2019 [facility], [place]                                                        End Of Treatment Note  Diagnoses: C32.0-Malignant neoplasm of glottis  Cancer Staging:  Cancer Staging Glottis carcinoma [facility]) Staging form: Larynx - Glottis, AJCC 8th Edition - Clinical stage from 06/03/2019: Stage I (cT1b, cN0, cM0) - Signed by Eppie Gibson, MD on 06/03/2019   Intent: Curative  Radiation Treatment Dates: 06/09/2019 through 07/17/2019  Site Technique Total Dose (Gy) Dose per Fx (Gy) Completed Fx Beam Energies  Larynx: HN_larynx 3D 63/63 2.25 28/28 6X   Narrative: The patient tolerated radiation therapy relatively well.   Plan: The patient will follow-up with radiation oncology in 2 wks, sooner prn.  -----------------------------------  Eppie Gibson, MD

## 2019-08-20 ENCOUNTER — Ambulatory Visit: Payer: Self-pay | Admitting: Radiation Oncology

## 2019-08-28 ENCOUNTER — Other Ambulatory Visit: Payer: Self-pay | Admitting: Adult Health

## 2019-09-02 ENCOUNTER — Other Ambulatory Visit: Payer: Self-pay | Admitting: Adult Health

## 2019-09-03 ENCOUNTER — Telehealth (INDEPENDENT_AMBULATORY_CARE_PROVIDER_SITE_OTHER): Payer: No Typology Code available for payment source | Admitting: Adult Health

## 2019-09-03 ENCOUNTER — Other Ambulatory Visit: Payer: Self-pay

## 2019-09-03 DIAGNOSIS — F419 Anxiety disorder, unspecified: Secondary | ICD-10-CM

## 2019-09-03 DIAGNOSIS — R69 Illness, unspecified: Secondary | ICD-10-CM | POA: Diagnosis not present

## 2019-09-03 MED ORDER — ALPRAZOLAM 0.25 MG PO TABS: 0.2500 mg | ORAL_TABLET | Freq: Every evening | ORAL | 0 refills | Status: AC | PRN

## 2019-09-03 MED ORDER — SERTRALINE HCL 100 MG PO TABS: 100.0000 mg | ORAL_TABLET | Freq: Every day | ORAL | 1 refills | Status: DC

## 2019-09-03 NOTE — Progress Notes (Signed)
Virtual Visit via Video Note  I connected with Aaron Black  on 09/03/19 at  7:30 AM EST by a video enabled telemedicine application and verified that I am speaking with the correct person using two identifiers.  Location patient: home Location provider:work or home office Persons participating in the virtual visit: patient, provider  I discussed the limitations of evaluation and management by telemedicine and the availability of in person appointments. The patient expressed understanding and agreed to proceed.   HPI: Recently completed treatment for glottis carcinoma.  He has his follow-up endoscopy on February 23.  Reports that he is doing well, his appetite is back and his voice is back to normal.  Today he is being seen for concern of pain management.  In the past he has been seen by rheumatology for management of osteoarthritis in his lower back.  His doctor is retiring and he will now be seen in the physician assistant who reports "will not prescribe me my pain medication I am taking Xanax".  Is currently prescribed Xanax and Zoloft 50 mg for anxiety and does report that this is well controlled.  He takes Xanax daily.  He is wondering if there is anything that we can do so that he can still get his pain medication   ROS: See pertinent positives and negatives per HPI.  Past Medical History:  Diagnosis Date  . Anemia   . Anxiety   . Arthritis   . B12 deficiency   . Cellulitis of arm, left 10/2017   after cat scratch  . Cervical disc disease    a. 07/2003 ant cervical deomcpression and fusion C5-6/C6-7;  b. 09/2007 post cervical laminectomy C4-5 with scres and arthrodesis C4-C7.  Marland Kitchen Chest pain at rest 02/25/2013  . Chronic lower back pain   . Complication of anesthesia    " i WOKE UP DURING A COLONOSCOPY "- 2008  . Depression   . Diverticulosis of colon   . GERD (gastroesophageal reflux disease)   . Gout   . Hyperlipidemia   . Hypertension   . Pleurisy   . Shortness of breath  dyspnea    with exertion- "I cant exercise now"  . Tobacco abuse    a. 40 yrs, 1.5-3 ppd over that time.    Past Surgical History:  Procedure Laterality Date  . ANTERIOR LATERAL LUMBAR FUSION 4 LEVELS Right 06/05/2014   Procedure: ANTERIOR LATERAL LUMBAR FUSION  LUMBAR ONE TO LUMBAR FIVE with Percutaneous Pedicle Screws;  Surgeon: Erline Levine, MD;  Location: [place] [facility];  Service: Neurosurgery;  Laterality: Right;  . CARPAL TUNNEL RELEASE Right   . CERVICAL DISC SURGERY     X 2  . COLONOSCOPY    . ELBOW ARTHROSCOPY Right   . KNEE ARTHROSCOPY Right 08/2013   torn menicus  . KNEE ARTHROSCOPY Left 11/2013   chip cartlidge,torn menicus  . LUMBAR LAMINECTOMY/DECOMPRESSION MICRODISCECTOMY Right 07/11/2013   Procedure: LUMBAR LAMINECTOMY/DECOMPRESSION MICRODISCECTOMY 1 LEVEL;  Surgeon: Erline Levine, MD;  Location: [place] [facility];  Service: Neurosurgery;  Laterality: Right;  Right L34 microdiskectomy  . LUMBAR PERCUTANEOUS PEDICLE SCREW 4 LEVEL N/A 06/05/2014   Procedure: LUMBAR PERCUTANEOUS PEDICLE SCREW 4 LEVEL;  Surgeon: Erline Levine, MD;  Location: [place] [facility];  Service: Neurosurgery;  Laterality: N/A;  . MICROLARYNGOSCOPY Left 05/12/2019   Procedure: MICROLARYNGOSCOPY WITH BIOPSY;  Surgeon: Rozetta Nunnery, MD;  Location: [facility];  Service: ENT;  Laterality: Left;  . NECK SURGERY  January 2005, March 2009  C-Spine  . ROTATOR CUFF REPAIR  August 2000, January 2002, July 2008, July 2009   2 left 2 right    Family History  Problem Relation Age of Onset  . Bone cancer Mother   . Squamous cell carcinoma Mother        died @ 83  . Heart attack Mother   . Hypertension Father   . Diabetes Father        borderline  . Congestive Heart Failure Father        alive @ 69  . Heart failure Father   . Sudden death Brother        died @ 41  . Other Brother        died in [place] @ 66 - struck by drunk driver  . Other Brother        died in [place] @ 74 - struck by drunk  driver  . Heart attack Brother   . Stroke Paternal Grandfather   . Colon cancer Neg Hx   . Esophageal cancer Neg Hx   . Rectal cancer Neg Hx   . Stomach cancer Neg Hx        Current Outpatient Medications:  .  albuterol (VENTOLIN HFA) 108 (90 Base) MCG/ACT inhaler, Inhale 2 puffs into the lungs every 6 (six) hours as needed for wheezing or shortness of breath., Disp: 8 g, Rfl: 0 .  allopurinol (ZYLOPRIM) 100 MG tablet, TAKE 1 TABLET BY MOUTH EVERY DAY, Disp: 90 tablet, Rfl: 1 .  ALPRAZolam (XANAX) 0.5 MG tablet, TAKE 1 TABLET BY MOUTH THREE TIMES A DAY AS NEEDED FOR ANXIETY, Disp: 90 tablet, Rfl: 1 .  atorvastatin (LIPITOR) 40 MG tablet, Take 1 tablet (40 mg total) by mouth daily., Disp: 90 tablet, Rfl: 3 .  colchicine 0.6 MG tablet, TAKE 1 TABLET (0.6 MG TOTAL) BY MOUTH DAILY AS NEEDED (GOUT)., Disp: 90 tablet, Rfl: 1 .  cyclobenzaprine (FLEXERIL) 10 MG tablet, Take 10 mg by mouth daily., Disp: , Rfl:  .  diclofenac sodium (VOLTAREN) 1 % GEL, Apply 2 g topically as needed (for pain). , Disp: , Rfl: 0 .  diltiazem (CARDIZEM CD) 360 MG 24 hr capsule, TAKE 1 CAPSULE BY MOUTH EVERY DAY, Disp: 90 capsule, Rfl: 0 .  HYDROcodone-acetaminophen (NORCO/VICODIN) 5-325 MG tablet, Take 1 tablet by mouth every 8 (eight) hours as needed for moderate pain. , Disp: , Rfl: 0 .  lidocaine (XYLOCAINE) 2 % solution, Patient: Mix 1part 2% viscous lidocaine, 1part H20. Swish & swallow 65mL of diluted mixture, 30min before meals and at bedtime, up to QID, Disp: 300 mL, Rfl: 3 .  losartan (COZAAR) 100 MG tablet, Take 1 tablet (100 mg total) by mouth daily., Disp: 90 tablet, Rfl: 1 .  omeprazole (PRILOSEC OTC) 20 MG tablet, Take 2 tablets (40 mg total) by mouth daily., Disp: , Rfl:  .  sertraline (ZOLOFT) 50 MG tablet, Take 1 tablet (50 mg total) by mouth daily., Disp: 90 tablet, Rfl: 1 .  valACYclovir (VALTREX) 1000 MG tablet, Take 1 tablet (1,000 mg total) by mouth 3 (three) times daily. For shingles, Disp: 21  tablet, Rfl: 0 .  vitamin B-12 (CYANOCOBALAMIN) 1000 MCG tablet, Take 1,000 mcg by mouth daily., Disp: , Rfl:  .  diltiazem (TIAZAC) 240 MG 24 hr capsule, diltiazem CD 240 mg capsule,extended release 24 hr, Disp: , Rfl:  .  Omeprazole (PRILOSEC PO), omeprazole, Disp: , Rfl:   EXAM:  VITALS per patient if applicable:  GENERAL: alert,  oriented, appears well and in no acute distress  HEENT: atraumatic, conjunttiva clear, no obvious abnormalities on inspection of external nose and ears  NECK: normal movements of the head and neck  LUNGS: on inspection no signs of respiratory distress, breathing rate appears normal, no obvious gross SOB, gasping or wheezing  CV: no obvious cyanosis  MS: moves all visible extremities without noticeable abnormality  PSYCH/NEURO: pleasant and cooperative, no obvious depression or anxiety, speech and thought processing grossly intact  ASSESSMENT AND PLAN:  Discussed the following assessment and plan:  1. Anxiety disorder, unspecified type -We will have him start weaning off Xanax.  Will increase Zoloft to 100 mg daily.  Taper instructions given.  He was advised to follow-up as needed - ALPRAZolam (XANAX) 0.25 MG tablet; Take 1 tablet (0.25 mg total) by mouth at bedtime as needed for anxiety.  Dispense: 30 tablet; Refill: 0 - sertraline (ZOLOFT) 100 MG tablet; Take 1 tablet (100 mg total) by mouth daily.  Dispense: 90 tablet; Refill: 1     I discussed the assessment and treatment plan with the patient. The patient was provided an opportunity to ask questions and all were answered. The patient agreed with the plan and demonstrated an understanding of the instructions.   The patient was advised to call back or seek an in-person evaluation if the symptoms worsen or if the condition fails to improve as anticipated.   Dorothyann Peng, NP

## 2019-09-08 ENCOUNTER — Ambulatory Visit: Payer: 59 | Attending: Adult Health

## 2019-09-08 ENCOUNTER — Other Ambulatory Visit: Payer: Self-pay

## 2019-09-08 DIAGNOSIS — R1310 Dysphagia, unspecified: Secondary | ICD-10-CM | POA: Diagnosis not present

## 2019-09-08 NOTE — Therapy (Signed)
[facility] [address] [place], [place], [address] Phone: [phone]   Fax:  [phone]  Speech Language Pathology Treatment  Patient Details  Name: Aaron Black MRN: [id number] Date of Birth: 1955/10/23 Referring Provider (SLP): Eppie Gibson, MD   Encounter Date: 09/08/2019  End of Session - 09/08/19 0907    Visit Number  4    Number of Visits  7    Date for SLP Re-Evaluation  12/12/19    SLP Start Time  0840    SLP Stop Time   0908    SLP Time Calculation (min)  28 min    Activity Tolerance  Patient tolerated treatment well       Past Medical History:  Diagnosis Date  . Anemia   . Anxiety   . Arthritis   . B12 deficiency   . Cellulitis of arm, left 10/2017   after cat scratch  . Cervical disc disease    a. 07/2003 ant cervical deomcpression and fusion C5-6/C6-7;  b. 09/2007 post cervical laminectomy C4-5 with scres and arthrodesis C4-C7.  Marland Kitchen Chest pain at rest 02/25/2013  . Chronic lower back pain   . Complication of anesthesia    " i WOKE UP DURING A COLONOSCOPY "- 2008  . Depression   . Diverticulosis of colon   . GERD (gastroesophageal reflux disease)   . Gout   . Hyperlipidemia   . Hypertension   . Pleurisy   . Shortness of breath dyspnea    with exertion- "I cant exercise now"  . Tobacco abuse    a. 40 yrs, 1.5-3 ppd over that time.    Past Surgical History:  Procedure Laterality Date  . ANTERIOR LATERAL LUMBAR FUSION 4 LEVELS Right 06/05/2014   Procedure: ANTERIOR LATERAL LUMBAR FUSION  LUMBAR ONE TO LUMBAR FIVE with Percutaneous Pedicle Screws;  Surgeon: Erline Levine, MD;  Location: [facility];  Service: Neurosurgery;  Laterality: Right;  . CARPAL TUNNEL RELEASE Right   . CERVICAL DISC SURGERY     X 2  . COLONOSCOPY    . ELBOW ARTHROSCOPY Right   . KNEE ARTHROSCOPY Right 08/2013   torn menicus  . KNEE ARTHROSCOPY Left 11/2013   chip cartlidge,torn menicus  . LUMBAR  LAMINECTOMY/DECOMPRESSION MICRODISCECTOMY Right 07/11/2013   Procedure: LUMBAR LAMINECTOMY/DECOMPRESSION MICRODISCECTOMY 1 LEVEL;  Surgeon: Erline Levine, MD;  Location: [facility];  Service: Neurosurgery;  Laterality: Right;  Right L34 microdiskectomy  . LUMBAR PERCUTANEOUS PEDICLE SCREW 4 LEVEL N/A 06/05/2014   Procedure: LUMBAR PERCUTANEOUS PEDICLE SCREW 4 LEVEL;  Surgeon: Erline Levine, MD;  Location: [facility];  Service: Neurosurgery;  Laterality: N/A;  . MICROLARYNGOSCOPY Left 05/12/2019   Procedure: MICROLARYNGOSCOPY WITH BIOPSY;  Surgeon: Rozetta Nunnery, MD;  Location: [facility];  Service: ENT;  Laterality: Left;  . NECK SURGERY  January 2005, March 2009   C-Spine  . ROTATOR CUFF REPAIR  August 2000, January 2002, July 2008, July 2009   2 left 2 right    There were no vitals filed for this visit.  Subjective Assessment - 09/08/19 0847    Subjective  "It's been a stressful weekend, [name]."    Currently in Pain?  Yes    Pain Score  3     Pain Location  Throat            ADULT SLP TREATMENT - 09/08/19 0849      General Information   Behavior/Cognition  Alert;Cooperative;Pleasant mood  Treatment Provided   Treatment provided  Dysphagia      Dysphagia Treatment   Temperature Spikes Noted  No    Respiratory Status  Room air    Oral Cavity - Dentition  Adequate natural dentition    Treatment Methods  Skilled observation;Therapeutic exercise;Patient/caregiver education    Patient observed directly with PO's  Yes    Type of PO's observed  Thin liquids;Dysphagia 3 (soft)    Liquids provided via  --   bottle   Oral Phase Signs & Symptoms  --   none noted   Pharyngeal Phase Signs & Symptoms  --   none noted   Other treatment/comments  Pt eating tougher foods, "anything I want." Pt performed HEP with modified independence. SLP reminded pt that he needed to cont to perform HEP BID in order to give himself best opportunity for WNL swallowing. Pt  told SLP overt s/sx aspiration PNA with modified independence.      Assessment / Recommendations / Plan   Plan  Continue with current plan of care   once every 8 weeks due to progress      SLP Education - 09/08/19 0920    Education Details  need to do HEP BID    Person(s) Educated  Patient    Methods  Explanation    Comprehension  Verbalized understanding       SLP Short Term Goals - 09/08/19 0857      SLP SHORT TERM GOAL #1   Title  Pt will complete HEP with rare min A over two sessions    Baseline  07-09-19    Status  Partially Met      SLP SHORT TERM GOAL #2   Title  Pt will tell SLP rationale for HEP completion over two sessions    Baseline  08-12-19    Status  Partially Met      SLP SHORT TERM GOAL #3   Title  Pt will tell SLP how a food journal can hasten/facilitate return to more normalized diet    Status  Achieved      SLP SHORT TERM GOAL #4   Title  pt will tell SLP 3 overt s/sx of aspiraiton PNA over two sessions    Baseline  08-12-19, 09-08-19    Status  Achieved       SLP Long Term Goals - 09/08/19 0857      SLP LONG TERM GOAL #1   Title  Pt will complete HEP with modified independence over 3 visits    Baseline  09-08-19    Time  2    Period  --   sessions, for all LTGs   Status  On-going      SLP LONG TERM GOAL #2   Title  Pt will tell SLP when to decr frequency of HEP over two sessions    Time  3    Status  On-going       Plan - 09/08/19 0915    Clinical Impression Statement  Pt presents today with WFL/WNL swallow function. No overt s/sx aspiration today, with 7 sips and a cereal bar today. Pt voice quality sounds WNL today. Modified independence with HEP procedure. Data suggest that as pts progress through rad or chemorad therapy that their swallowing ability will decrease. Also, WNL swallowing is threatened by muscle fibrosis that will likely develop after rad/chemorad is completed. Therefore, skilled ST would be beneficial to the pt in order to  regularly assess pt's safety with POs  and/or need for instrumental swallow assessment, as well as to assess proper completion of HEP.    Speech Therapy Frequency  --   approx once/ 4 weeks   Duration  --   6 visits   Treatment/Interventions  Aspiration precaution training;Pharyngeal strengthening exercises;Diet toleration management by SLP;Trials of upgraded texture/liquids;SLP instruction and feedback;Patient/family education;Compensatory strategies    Potential to Achieve Goals  Good    SLP Home Exercise Plan  provided today    Consulted and Agree with Plan of Care  Patient       Patient will benefit from skilled therapeutic intervention in order to improve the following deficits and impairments:   Dysphagia, unspecified type    Problem List Patient Active Problem List   Diagnosis Date Noted  . Glottis carcinoma ([facility]) 06/03/2019  . Encounter for tobacco use cessation counseling 11/01/2017  . Pseudoarthrosis of lumbar spine 01/21/2016  . Lumbar spine scoliosis 06/05/2014  . Herniated lumbar disc without myelopathy 07/11/2013  . Tobacco abuse 09/25/2011  . Gout 05/27/2009  . VITAMIN B12 DEFICIENCY 09/08/2008  . HLD (hyperlipidemia) 09/08/2008  . COLONIC POLYPS, HX OF 09/08/2008  . Anemia, unspecified 06/30/2008  . Anxiety disorder 06/29/2008  . Essential hypertension 06/29/2008  . GERD 06/29/2008  . Diverticulosis of colon 06/29/2008  . ARTHRITIS 06/29/2008  . HERNIATED DISC 06/29/2008    [facility] ,MS, CCC-SLP  09/08/2019, 9:20 AM  [facility] [address] [place], [place], [address] Phone: [phone]   Fax:  [phone]   Name: Aaron Black MRN: [id number] Date of Birth: [birth year]

## 2019-09-09 ENCOUNTER — Other Ambulatory Visit: Payer: Self-pay | Admitting: Adult Health

## 2019-09-16 ENCOUNTER — Other Ambulatory Visit: Payer: Self-pay

## 2019-09-16 ENCOUNTER — Ambulatory Visit (INDEPENDENT_AMBULATORY_CARE_PROVIDER_SITE_OTHER): Payer: 59 | Admitting: Otolaryngology

## 2019-09-16 VITALS — Temp 97.2°F

## 2019-09-16 DIAGNOSIS — Z8521 Personal history of malignant neoplasm of larynx: Secondary | ICD-10-CM | POA: Diagnosis not present

## 2019-09-16 NOTE — Progress Notes (Signed)
HPI: Aaron Black is a 64 y.o. male who returns today for evaluation of left true vocal cord cancer.  The cancer involved predominately via the left anterior true vocal cord but also involve the anterior commissure in the anterior part of the right true vocal cord.  He underwent radiation therapy that he completed 2 months ago.  He has been doing well still has some difficulty swallowing.  He has an appointment with Dr. Isidore Moos in about 6 months..  Past Medical History:  Diagnosis Date  . Anemia   . Anxiety   . Arthritis   . B12 deficiency   . Cellulitis of arm, left 10/2017   after cat scratch  . Cervical disc disease    a. 07/2003 ant cervical deomcpression and fusion C5-6/C6-7;  b. 09/2007 post cervical laminectomy C4-5 with scres and arthrodesis C4-C7.  Marland Kitchen Chest pain at rest 02/25/2013  . Chronic lower back pain   . Complication of anesthesia    " i WOKE UP DURING A COLONOSCOPY "- 2008  . Depression   . Diverticulosis of colon   . GERD (gastroesophageal reflux disease)   . Gout   . Hyperlipidemia   . Hypertension   . Pleurisy   . Shortness of breath dyspnea    with exertion- "I cant exercise now"  . Tobacco abuse    a. 40 yrs, 1.5-3 ppd over that time.   Past Surgical History:  Procedure Laterality Date  . ANTERIOR LATERAL LUMBAR FUSION 4 LEVELS Right 06/05/2014   Procedure: ANTERIOR LATERAL LUMBAR FUSION  LUMBAR ONE TO LUMBAR FIVE with Percutaneous Pedicle Screws;  Surgeon: Erline Levine, MD;  Location: [facility];  Service: Neurosurgery;  Laterality: Right;  . CARPAL TUNNEL RELEASE Right   . CERVICAL DISC SURGERY     X 2  . COLONOSCOPY    . ELBOW ARTHROSCOPY Right   . KNEE ARTHROSCOPY Right 08/2013   torn menicus  . KNEE ARTHROSCOPY Left 11/2013   chip cartlidge,torn menicus  . LUMBAR LAMINECTOMY/DECOMPRESSION MICRODISCECTOMY Right 07/11/2013   Procedure: LUMBAR LAMINECTOMY/DECOMPRESSION MICRODISCECTOMY 1 LEVEL;  Surgeon: Erline Levine, MD;  Location: [facility];   Service: Neurosurgery;  Laterality: Right;  Right L34 microdiskectomy  . LUMBAR PERCUTANEOUS PEDICLE SCREW 4 LEVEL N/A 06/05/2014   Procedure: LUMBAR PERCUTANEOUS PEDICLE SCREW 4 LEVEL;  Surgeon: Erline Levine, MD;  Location: [facility];  Service: Neurosurgery;  Laterality: N/A;  . MICROLARYNGOSCOPY Left 05/12/2019   Procedure: MICROLARYNGOSCOPY WITH BIOPSY;  Surgeon: Rozetta Nunnery, MD;  Location: [facility];  Service: ENT;  Laterality: Left;  . NECK SURGERY  January 2005, March 2009   C-Spine  . ROTATOR CUFF REPAIR  August 2000, January 2002, July 2008, July 2009   2 left 2 right   Social History   Socioeconomic History  . Marital status: Married    Spouse name: Not on file  . Number of children: Not on file  . Years of education: Not on file  . Highest education level: Not on file  Occupational History  . Not on file  Tobacco Use  . Smoking status: Current Some Day Smoker    Packs/day: 0.50    Years: 40.00    Pack years: 20.00    Types: Cigarettes  . Smokeless tobacco: Never Used  . Tobacco comment: He is smoking about 4-5 cigarettes, He had smoked up to 3 packs daily. 06/03/19  Substance and Sexual Activity  . Alcohol use: Yes    Comment: occasional  .  Drug use: No  . Sexual activity: Not on file  Other Topics Concern  . Not on file  Social History Narrative   Lives in [place] with wife.  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   Social Determinants of Health   Financial Resource Strain:   . Difficulty of Paying Living Expenses: Not on file  Food Insecurity:   . Worried About Charity fundraiser in the Last Year: Not on file  . Ran Out of Food in the Last Year: Not on file  Transportation Needs: No Transportation Needs  . Lack of Transportation (Medical): No  . Lack of Transportation (Non-Medical): No  Physical Activity:   . Days of Exercise per Week: Not on file  . Minutes of Exercise per Session: Not on file   Stress:   . Feeling of Stress : Not on file  Social Connections:   . Frequency of Communication with Friends and Family: Not on file  . Frequency of Social Gatherings with Friends and Family: Not on file  . Attends Religious Services: Not on file  . Active Member of Clubs or Organizations: Not on file  . Attends Archivist Meetings: Not on file  . Marital Status: Not on file   Family History  Problem Relation Age of Onset  . Bone cancer Mother   . Squamous cell carcinoma Mother        died @ 29  . Heart attack Mother   . Hypertension Father   . Diabetes Father        borderline  . Congestive Heart Failure Father        alive @ 16  . Heart failure Father   . Sudden death Brother        died @ 12  . Other Brother        died in [place] @ 97 - struck by drunk driver  . Other Brother        died in [place] @ 39 - struck by drunk driver  . Heart attack Brother   . Stroke Paternal Grandfather   . Colon cancer Neg Hx   . Esophageal cancer Neg Hx   . Rectal cancer Neg Hx   . Stomach cancer Neg Hx    Allergies  Allergen Reactions  . Lisinopril Swelling    Swollen Lip  . Percodan [Oxycodone-Aspirin] Rash    Percodan caused rash---but he can tolerate Percocet and aspirin on their own    Prior to Admission medications   Medication Sig Start Date End Date Taking? Authorizing Provider  albuterol (VENTOLIN HFA) 108 (90 Base) MCG/ACT inhaler Inhale 2 puffs into the lungs every 6 (six) hours as needed for wheezing or shortness of breath. 04/12/19  Yes Burky, Lanelle Bal B, NP  allopurinol (ZYLOPRIM) 100 MG tablet TAKE 1 TABLET BY MOUTH EVERY DAY 04/15/19  Yes Nafziger, Tommi Rumps, NP  ALPRAZolam (XANAX) 0.25 MG tablet Take 1 tablet (0.25 mg total) by mouth at bedtime as needed for anxiety. 09/03/19 10/03/19 Yes Nafziger, Tommi Rumps, NP  atorvastatin (LIPITOR) 40 MG tablet TAKE 1 TABLET BY MOUTH EVERY DAY 09/09/19  Yes Nafziger, Tommi Rumps, NP  colchicine 0.6 MG tablet TAKE 1 TABLET (0.6 MG TOTAL) BY MOUTH  DAILY AS NEEDED (GOUT). 03/20/18  Yes Marletta Lor, MD  cyclobenzaprine (FLEXERIL) 10 MG tablet Take 10 mg by mouth daily. 12/27/18  Yes [provider]  diclofenac sodium (VOLTAREN) 1 % GEL Apply 2 g topically as needed (  for pain).  04/01/17  Yes [provider]  diltiazem (CARDIZEM CD) 360 MG 24 hr capsule TAKE 1 CAPSULE BY MOUTH EVERY DAY 08/29/19  Yes Nafziger, Tommi Rumps, NP  diltiazem (TIAZAC) 240 MG 24 hr capsule diltiazem CD 240 mg capsule,extended release 24 hr   Yes [provider]  HYDROcodone-acetaminophen (NORCO/VICODIN) 5-325 MG tablet Take 1 tablet by mouth every 8 (eight) hours as needed for moderate pain.  09/24/17  Yes [provider]  lidocaine (XYLOCAINE) 2 % solution Patient: Mix 1part 2% viscous lidocaine, 1part H20. Swish & swallow 16mL of diluted mixture, 59min before meals and at bedtime, up to QID 07/07/19  Yes Eppie Gibson, MD  losartan (COZAAR) 100 MG tablet Take 1 tablet (100 mg total) by mouth daily. 05/21/19  Yes Nafziger, Tommi Rumps, NP  omeprazole (PRILOSEC OTC) 20 MG tablet Take 2 tablets (40 mg total) by mouth daily. 02/27/13  Yes Viyuoh, Adeline C, MD  Omeprazole (PRILOSEC PO) omeprazole   Yes [provider]  sertraline (ZOLOFT) 100 MG tablet Take 1 tablet (100 mg total) by mouth daily. 09/03/19  Yes Nafziger, Tommi Rumps, NP  valACYclovir (VALTREX) 1000 MG tablet Take 1 tablet (1,000 mg total) by mouth 3 (three) times daily. For shingles 05/29/19  Yes Panosh, Standley Brooking, MD  vitamin B-12 (CYANOCOBALAMIN) 1000 MCG tablet Take 1,000 mcg by mouth daily.   Yes [provider]     Positive ROS: Otherwise negative  All other systems have been reviewed and were otherwise negative with the exception of those mentioned in the HPI and as above.  Physical Exam: Constitutional: Alert, well-appearing, no acute distress Ears: External ears without lesions or tenderness. Ear canals are clear bilaterally with intact, clear TMs.  Nasal: External  nose without lesions. Septum midline.. Clear nasal passages Oral: Lips and gums without lesions. Tongue and palate mucosa without lesions. Posterior oropharynx clear. Fiberoptic laryngoscopy was performed through the right nostril.  Nasopharynx was clear.  Base of tongue vallecula epiglottis were normal.  Vocal cords were clear bilaterally with normal vocal cord mobility.  No evidence of any lesions on either vocal cord. Neck: No palpable adenopathy or masses.  No palpable adenopathy in the neck. Respiratory: Breathing comfortably  Skin: No facial/neck lesions or rash noted.  Laryngoscopy  Date/Time: 09/16/2019 2:15 PM Performed by: Rozetta Nunnery, MD Authorized by: Rozetta Nunnery, MD   Consent:    Consent obtained:  Verbal   Consent given by:  Patient   Risks discussed:  Pain Procedure details:    Indications: oncologic surveillance follow-up     Medication:  Afrin   Scope location: right nare   Mouth:    Vallecula: normal     Base of tongue: normal     Epiglottis: normal   Throat:    True vocal cords: normal   Comments:     Vocal cords were clear bilaterally with normal vocal cord mobility.  No lesions or leukoplakia noted on either side.    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  Status post completion of radiation therapy 2 months ago.  Plan: He will follow-up in 6 months for recheck.   Radene Journey, MD

## 2019-09-23 DIAGNOSIS — L570 Actinic keratosis: Secondary | ICD-10-CM | POA: Diagnosis not present

## 2019-09-23 DIAGNOSIS — D485 Neoplasm of uncertain behavior of skin: Secondary | ICD-10-CM | POA: Diagnosis not present

## 2019-09-23 DIAGNOSIS — D2239 Melanocytic nevi of other parts of face: Secondary | ICD-10-CM | POA: Diagnosis not present

## 2019-09-25 DIAGNOSIS — Z6826 Body mass index (BMI) 26.0-26.9, adult: Secondary | ICD-10-CM | POA: Diagnosis not present

## 2019-09-25 DIAGNOSIS — M5030 Other cervical disc degeneration, unspecified cervical region: Secondary | ICD-10-CM | POA: Diagnosis not present

## 2019-09-25 DIAGNOSIS — M150 Primary generalized (osteo)arthritis: Secondary | ICD-10-CM | POA: Diagnosis not present

## 2019-09-25 DIAGNOSIS — E663 Overweight: Secondary | ICD-10-CM | POA: Diagnosis not present

## 2019-09-25 DIAGNOSIS — C140 Malignant neoplasm of pharynx, unspecified: Secondary | ICD-10-CM | POA: Diagnosis not present

## 2019-09-25 DIAGNOSIS — M1A09X0 Idiopathic chronic gout, multiple sites, without tophus (tophi): Secondary | ICD-10-CM | POA: Diagnosis not present

## 2019-09-26 ENCOUNTER — Ambulatory Visit (INDEPENDENT_AMBULATORY_CARE_PROVIDER_SITE_OTHER)
Admission: RE | Admit: 2019-09-26 | Discharge: 2019-09-26 | Disposition: A | Discharge status: Home / Self Care | Payer: 59 | Source: Ambulatory Visit | Attending: Acute Care | Admitting: Acute Care

## 2019-09-26 ENCOUNTER — Other Ambulatory Visit: Payer: Self-pay

## 2019-09-26 DIAGNOSIS — Z87891 Personal history of nicotine dependence: Secondary | ICD-10-CM | POA: Diagnosis not present

## 2019-09-26 DIAGNOSIS — Z122 Encounter for screening for malignant neoplasm of respiratory organs: Secondary | ICD-10-CM

## 2019-09-26 DIAGNOSIS — F17210 Nicotine dependence, cigarettes, uncomplicated: Secondary | ICD-10-CM

## 2019-09-26 DIAGNOSIS — F17200 Nicotine dependence, unspecified, uncomplicated: Secondary | ICD-10-CM | POA: Diagnosis not present

## 2019-09-26 DIAGNOSIS — R69 Illness, unspecified: Secondary | ICD-10-CM | POA: Diagnosis not present

## 2019-10-02 ENCOUNTER — Other Ambulatory Visit: Payer: Self-pay | Admitting: *Deleted

## 2019-10-02 DIAGNOSIS — Z87891 Personal history of nicotine dependence: Secondary | ICD-10-CM

## 2019-10-02 NOTE — Progress Notes (Signed)
Please call patient and let them  know their  low dose Ct was read as a Lung RADS 2: nodules that are benign in appearance and behavior with a very low likelihood of becoming a clinically active cancer due to size or lack of growth. Recommendation per radiology is for a repeat LDCT in 12 months. Please let them  know we will order and schedule their  annual screening scan for 09/2020. Please let them  know there was notation of CAD on their  scan.  Please remind the patient  that this is a non-gated exam therefore degree or severity of disease  cannot be determined. Please have them  follow up with their PCP regarding potential risk factor modification, dietary therapy or pharmacologic therapy if clinically indicated. Pt.  is  currently on statin therapy. Please place order for annual  screening scan for  09/2020 and fax results to PCP. Thanks so much.

## 2019-10-09 ENCOUNTER — Other Ambulatory Visit: Payer: Self-pay | Admitting: Adult Health

## 2019-10-10 NOTE — Telephone Encounter (Signed)
Sent to the pharmacy by e-scribe for 90 days.  Pt has upcoming cpx. 

## 2019-11-05 ENCOUNTER — Ambulatory Visit: Payer: 59 | Attending: Adult Health

## 2019-11-05 ENCOUNTER — Other Ambulatory Visit: Payer: Self-pay

## 2019-11-05 DIAGNOSIS — R1310 Dysphagia, unspecified: Secondary | ICD-10-CM | POA: Insufficient documentation

## 2019-11-05 DIAGNOSIS — R490 Dysphonia: Secondary | ICD-10-CM | POA: Insufficient documentation

## 2019-11-05 NOTE — Therapy (Signed)
[facility] [address] [place], [place], [address] Phone: [phone]   Fax:  [phone]  Speech Language Pathology Treatment/ Discharge Summary  Patient Details  Name: Aaron Black MRN: [id number] Date of Birth: 1955-10-19 Referring Provider (SLP): Eppie Gibson, MD   Encounter Date: 11/05/2019  End of Session - 11/05/19 0836    Visit Number  5    Number of Visits  7    Date for SLP Re-Evaluation  12/12/19    SLP Start Time  0805    SLP Stop Time   0835    SLP Time Calculation (min)  30 min    Activity Tolerance  Patient tolerated treatment well       Past Medical History:  Diagnosis Date  . Anemia   . Anxiety   . Arthritis   . B12 deficiency   . Cellulitis of arm, left 10/2017   after cat scratch  . Cervical disc disease    a. 07/2003 ant cervical deomcpression and fusion C5-6/C6-7;  b. 09/2007 post cervical laminectomy C4-5 with scres and arthrodesis C4-C7.  Marland Kitchen Chest pain at rest 02/25/2013  . Chronic lower back pain   . Complication of anesthesia    " i WOKE UP DURING A COLONOSCOPY "- 2008  . Depression   . Diverticulosis of colon   . GERD (gastroesophageal reflux disease)   . Gout   . Hyperlipidemia   . Hypertension   . Pleurisy   . Shortness of breath dyspnea    with exertion- "I cant exercise now"  . Tobacco abuse    a. 40 yrs, 1.5-3 ppd over that time.    Past Surgical History:  Procedure Laterality Date  . ANTERIOR LATERAL LUMBAR FUSION 4 LEVELS Right 06/05/2014   Procedure: ANTERIOR LATERAL LUMBAR FUSION  LUMBAR ONE TO LUMBAR FIVE with Percutaneous Pedicle Screws;  Surgeon: Erline Levine, MD;  Location: [facility];  Service: Neurosurgery;  Laterality: Right;  . CARPAL TUNNEL RELEASE Right   . CERVICAL DISC SURGERY     X 2  . COLONOSCOPY    . ELBOW ARTHROSCOPY Right   . KNEE ARTHROSCOPY Right 08/2013   torn menicus  . KNEE ARTHROSCOPY Left 11/2013   chip cartlidge,torn menicus  .  LUMBAR LAMINECTOMY/DECOMPRESSION MICRODISCECTOMY Right 07/11/2013   Procedure: LUMBAR LAMINECTOMY/DECOMPRESSION MICRODISCECTOMY 1 LEVEL;  Surgeon: Erline Levine, MD;  Location: [facility];  Service: Neurosurgery;  Laterality: Right;  Right L34 microdiskectomy  . LUMBAR PERCUTANEOUS PEDICLE SCREW 4 LEVEL N/A 06/05/2014   Procedure: LUMBAR PERCUTANEOUS PEDICLE SCREW 4 LEVEL;  Surgeon: Erline Levine, MD;  Location: [facility];  Service: Neurosurgery;  Laterality: N/A;  . MICROLARYNGOSCOPY Left 05/12/2019   Procedure: MICROLARYNGOSCOPY WITH BIOPSY;  Surgeon: Rozetta Nunnery, MD;  Location: [facility];  Service: ENT;  Laterality: Left;  . NECK SURGERY  January 2005, March 2009   C-Spine  . ROTATOR CUFF REPAIR  August 2000, January 2002, July 2008, July 2009   2 left 2 right    There were no vitals filed for this visit.  Subjective Assessment - 11/05/19 0809    Subjective  "Everybody's been right on point, Aaron Black."    Currently in Pain?  No/denies            ADULT SLP TREATMENT - 11/05/19 0814      General Information   Behavior/Cognition  Alert;Cooperative;Pleasant mood      Treatment Provided   Treatment provided  Dysphagia  Dysphagia Treatment   Temperature Spikes Noted  No    Respiratory Status  Room air    Oral Cavity - Dentition  Adequate natural dentition    Treatment Methods  Skilled observation;Therapeutic exercise;Patient/caregiver education    Patient observed directly with PO's  Yes    Type of PO's observed  Thin liquids;Dysphagia 3 (soft)    Oral Phase Signs & Symptoms  Other (comment)   none noted   Pharyngeal Phase Signs & Symptoms  Other (comment)   none noted   Other treatment/comments  Pt eating "anything I want". Ate cereal bar and drank water without overt s/s aspiration. HEP completed with modified independence. Pt agrees to d/c today.       Assessment / Recommendations / Plan   Plan  Discharge SLP treatment due to (comment)    progress/satisfaction with current level     Progression Toward Goals   Progression toward goals  --   d/c day - see goals      SLP Education - 11/05/19 0835    Education Details  who to contcact should swallow difficulties arise, s/sx swallow difficulties    Person(s) Educated  Patient    Methods  Explanation;Handout    Comprehension  Verbalized understanding       SLP Short Term Goals - 09/08/19 0857      SLP SHORT TERM GOAL #1   Title  Pt will complete HEP with rare min A over two sessions    Baseline  07-09-19    Status  Partially Met      SLP SHORT TERM GOAL #2   Title  Pt will tell SLP rationale for HEP completion over two sessions    Baseline  08-12-19    Status  Partially Met      SLP SHORT TERM GOAL #3   Title  Pt will tell SLP how a food journal can hasten/facilitate return to more normalized diet    Status  Achieved      SLP SHORT TERM GOAL #4   Title  pt will tell SLP 3 overt s/sx of aspiraiton PNA over two sessions    Baseline  08-12-19, 09-08-19    Status  Achieved       SLP Long Term Goals - 11/05/19 0825      SLP LONG TERM GOAL #1   Title  Pt will complete HEP with modified independence over 3 visits    Baseline  09-08-19, 11-05-19    Status  Partially Met      SLP LONG TERM GOAL #2   Title  Pt will tell SLP when to decr frequency of HEP over two sessions    Baseline  11-05-19    Status  Partially Met       Plan - 11/05/19 0837    Clinical Impression Statement  Pt presents today with WFL/WNL swallow function. No overt s/sx aspiration today, with 10 sips and a cereal bar today. Pt voice quality sounds mildly hoarse today - pt complains of allergy symptoms. Modified independence with HEP procedure. Data suggest that as pts progress through rad or chemorad therapy that their swallowing ability will decrease. Also, WNL swallowing is threatened by muscle fibrosis that will likely develop after rad/chemorad is completed. Therefore, skilled ST was be  beneficial to the pt in order to regularly assess pt's safety with POs and/or need for instrumental swallow assessment, as well as to assess proper completion of HEP.Pt agrees with d/c today.    Treatment/Interventions  Aspiration precaution training;Pharyngeal strengthening exercises;Diet toleration management by SLP;Trials of upgraded texture/liquids;SLP instruction and feedback;Patient/family education;Compensatory strategies    Potential to Achieve Goals  Good    SLP Home Exercise Plan  provided today    Consulted and Agree with Plan of Care  Patient       Patient will benefit from skilled therapeutic intervention in order to improve the following deficits and impairments:   Dysphagia, unspecified type  Hoarseness of voice   SPEECH THERAPY DISCHARGE SUMMARY  Visits from Start of Care: 5  Current functional level related to goals / functional outcomes: See goals above. Pt performing HEP with modified independence, and is eating WNL diet. He is pleased with progress.    Remaining deficits: None   Education / Equipment: HEP procedure, late effects head/neck radiation  Plan: Patient agrees to discharge.  Patient goals were partially met. Patient is being discharged due to being pleased with the current functional level.  ?????       Problem List Patient Active Problem List   Diagnosis Date Noted  . Glottis carcinoma ([facility]) 06/03/2019  . Encounter for tobacco use cessation counseling 11/01/2017  . Pseudoarthrosis of lumbar spine 01/21/2016  . Lumbar spine scoliosis 06/05/2014  . Herniated lumbar disc without myelopathy 07/11/2013  . Tobacco abuse 09/25/2011  . Gout 05/27/2009  . VITAMIN B12 DEFICIENCY 09/08/2008  . HLD (hyperlipidemia) 09/08/2008  . COLONIC POLYPS, HX OF 09/08/2008  . Anemia, unspecified 06/30/2008  . Anxiety disorder 06/29/2008  . Essential hypertension 06/29/2008  . GERD 06/29/2008  . Diverticulosis of colon 06/29/2008  . ARTHRITIS 06/29/2008  .  HERNIATED DISC 06/29/2008    [facility] ,MS, CCC-SLP  11/05/2019, 8:38 AM  [facility] [address] [place], [place], [address] Phone: [phone]   Fax:  [phone]   Name: Aaron Black MRN: [id number] Date of Birth: 13-Apr-1956

## 2019-11-05 NOTE — Patient Instructions (Signed)
   If you should have more difficulty clearing food from your throat, or more coughing/clearing the throat than normal contact Dr. Lucia Gaskins.  You can bump down your frequency of your exercises to 2-3 times a week after 01-15-20.

## 2019-11-07 ENCOUNTER — Ambulatory Visit: Payer: 59 | Attending: Internal Medicine

## 2019-11-07 DIAGNOSIS — Z23 Encounter for immunization: Secondary | ICD-10-CM

## 2019-11-07 NOTE — Progress Notes (Signed)
   Covid-19 Vaccination Clinic  Name:  WELDON ERLINGER    MRN: [id number] DOB: 1956/06/27  11/07/2019  Mr. Kapple was observed post Covid-19 immunization for 15 minutes without incident. He was provided with Vaccine Information Sheet and instruction to access the V-Safe system.   Mr. Delgrande was instructed to call 911 with any severe reactions post vaccine: Marland Kitchen Difficulty breathing  . Swelling of face and throat  . A fast heartbeat  . A bad rash all over body  . Dizziness and weakness   Immunizations Administered    Name Date Dose VIS Date Route   Pfizer COVID-19 Vaccine 11/07/2019  8:58 AM 0.3 mL 07/04/2019 Intramuscular   Manufacturer: [place]   Lot: [id number]   [place]: [id number]

## 2019-11-10 ENCOUNTER — Other Ambulatory Visit: Payer: Self-pay

## 2019-11-11 ENCOUNTER — Encounter: Payer: Self-pay | Admitting: Adult Health

## 2019-11-11 ENCOUNTER — Ambulatory Visit (INDEPENDENT_AMBULATORY_CARE_PROVIDER_SITE_OTHER): Payer: 59 | Admitting: Adult Health

## 2019-11-11 VITALS — BP 158/92 | HR 81 | Temp 97.7°F | Ht 67.0 in | Wt 175.6 lb

## 2019-11-11 DIAGNOSIS — Z125 Encounter for screening for malignant neoplasm of prostate: Secondary | ICD-10-CM | POA: Diagnosis not present

## 2019-11-11 DIAGNOSIS — F419 Anxiety disorder, unspecified: Secondary | ICD-10-CM

## 2019-11-11 DIAGNOSIS — I10 Essential (primary) hypertension: Secondary | ICD-10-CM | POA: Diagnosis not present

## 2019-11-11 DIAGNOSIS — C320 Malignant neoplasm of glottis: Secondary | ICD-10-CM

## 2019-11-11 DIAGNOSIS — Z0000 Encounter for general adult medical examination without abnormal findings: Secondary | ICD-10-CM

## 2019-11-11 DIAGNOSIS — R69 Illness, unspecified: Secondary | ICD-10-CM | POA: Diagnosis not present

## 2019-11-11 DIAGNOSIS — M1000 Idiopathic gout, unspecified site: Secondary | ICD-10-CM | POA: Diagnosis not present

## 2019-11-11 DIAGNOSIS — E785 Hyperlipidemia, unspecified: Secondary | ICD-10-CM | POA: Diagnosis not present

## 2019-11-11 LAB — CBC WITH DIFFERENTIAL/PLATELET
Basophils Absolute: 0 10*3/uL (ref 0.0–0.1)
Basophils Relative: 0.8 % (ref 0.0–3.0)
Eosinophils Absolute: 0.2 10*3/uL (ref 0.0–0.7)
Eosinophils Relative: 2.9 % (ref 0.0–5.0)
HCT: 39.7 % (ref 39.0–52.0)
Hemoglobin: 13.2 g/dL (ref 13.0–17.0)
Lymphocytes Relative: 24.9 % (ref 12.0–46.0)
Lymphs Abs: 1.5 10*3/uL (ref 0.7–4.0)
MCHC: 33.4 g/dL (ref 30.0–36.0)
MCV: 90.4 fl (ref 78.0–100.0)
Monocytes Absolute: 0.6 10*3/uL (ref 0.1–1.0)
Monocytes Relative: 9.2 % (ref 3.0–12.0)
Neutro Abs: 3.7 10*3/uL (ref 1.4–7.7)
Neutrophils Relative %: 62.2 % (ref 43.0–77.0)
Platelets: 213 10*3/uL (ref 150.0–400.0)
RBC: 4.38 Mil/uL (ref 4.22–5.81)
RDW: 14.3 % (ref 11.5–15.5)
WBC: 6 10*3/uL (ref 4.0–10.5)

## 2019-11-11 LAB — COMPREHENSIVE METABOLIC PANEL
ALT: 35 U/L (ref 0–53)
AST: 35 U/L (ref 0–37)
Albumin: 4.9 g/dL (ref 3.5–5.2)
Alkaline Phosphatase: 95 U/L (ref 39–117)
BUN: 14 mg/dL (ref 6–23)
CO2: 27 mEq/L (ref 19–32)
Calcium: 9.5 mg/dL (ref 8.4–10.5)
Chloride: 100 mEq/L (ref 96–112)
Creatinine, Ser: 1.01 mg/dL (ref 0.40–1.50)
GFR: 74.78 mL/min (ref >60.00)
Glucose, Bld: 96 mg/dL (ref 70–99)
Potassium: 4.4 mEq/L (ref 3.5–5.1)
Sodium: 136 mEq/L (ref 135–145)
Total Bilirubin: 0.4 mg/dL (ref 0.2–1.2)
Total Protein: 7.3 g/dL (ref 6.0–8.3)

## 2019-11-11 LAB — LIPID PANEL
Cholesterol: 203 mg/dL — ABNORMAL HIGH (ref 0–200)
HDL: 70.2 mg/dL (ref >39.00)
LDL Cholesterol: 112 mg/dL — ABNORMAL HIGH (ref 0–99)
NonHDL: 132.41
Total CHOL/HDL Ratio: 3
Triglycerides: 100 mg/dL (ref 0.0–149.0)
VLDL: 20 mg/dL (ref 0.0–40.0)

## 2019-11-11 LAB — PSA: PSA: 0.49 ng/mL (ref 0.10–4.00)

## 2019-11-11 LAB — TSH: TSH: 0.86 u[IU]/mL (ref 0.35–4.50)

## 2019-11-11 MED ORDER — LOSARTAN POTASSIUM 100 MG PO TABS: 100.0000 mg | ORAL_TABLET | Freq: Every day | ORAL | 3 refills | Status: DC

## 2019-11-11 MED ORDER — DILTIAZEM HCL ER COATED BEADS 360 MG PO CP24: ORAL_CAPSULE | ORAL | 3 refills | Status: DC

## 2019-11-11 NOTE — Progress Notes (Signed)
Subjective:    Patient ID: Aaron Black, male    DOB: 09-17-1955, 64 y.o.   MRN: [id number]  HPI  Patient presents for yearly preventative medicine examination. He is a very pleasant 64 year old male who  has a past medical history of Anemia, Anxiety, Arthritis, B12 deficiency, Cellulitis of arm, left (10/2017), Cervical disc disease, Chest pain at rest (02/25/2013), Chronic lower back pain, Complication of anesthesia, Depression, Diverticulosis of colon, GERD (gastroesophageal reflux disease), Gout, Hyperlipidemia, Hypertension, Pleurisy, Shortness of breath dyspnea, and Tobacco abuse.  H/o of Glottis Carcinoma -diagnosed in 2020.  He has completed radiation treatment approximately 4 months ago. He reports that he is doing well and has no complaints. He follows up with Oncology about every 8 months.   Anxiety-well controlled with Zoloft. He was able to wean himself off Xanax.   Essential hypertension-takes Cozaar 100 mg and Cardizem 360 mg ER daily.  He denies dizziness, lightheadedness, chest pain, or shortness of breath. He did not take his blood pressure medication this morning.  BP Readings from Last 3 Encounters:  11/11/19 (!) 158/92  05/12/19 140/82  04/12/19 (!) 149/98   Hyperlipidemia -prescribed Lipitor 40 mg daily.  He denies myalgia or fatigue.  Lab Results  Component Value Date   CHOL 223 (H) 09/18/2018   HDL 60.80 09/18/2018   LDLCALC 136 (H) 06/27/2016   LDLDIRECT 132.0 09/18/2018   TRIG 248.0 (H) 09/18/2018   CHOLHDL 4 09/18/2018   H/o Gout -allopurinol 100 mg daily and has a prescription to take colchicine as needed for acute gout flares.  He denies any acute flares recently  Osteoarthritis -is followed by rheumatology.  Is currently prescribed Norco which he reports helps with the pain  All immunizations and health maintenance protocols were reviewed with the patient and needed orders were placed. He is up to date on routine vaccinations   Appropriate screening  laboratory values were ordered for the patient including screening of hyperlipidemia, renal function and hepatic function. If indicated by BPH, a PSA was ordered.  Medication reconciliation,  past medical history, social history, problem list and allergies were reviewed in detail with the patient  Goals were established with regard to weight loss, exercise, and  diet in compliance with medications. He works in the yard but does not exercise on a routine basis. His weight has increased " I am eating a lot more since I quit smoking".   Wt Readings from Last 3 Encounters:  11/11/19 175 lb 9.6 oz (79.7 kg)  06/04/19 172 lb 3.2 oz (78.1 kg)  05/12/19 169 lb 15.6 oz (77.1 kg)   He is up-to-date on routine colon cancer screening and does routine dental visits.   Review of Systems  Constitutional: Negative.   Eyes: Negative.   Respiratory: Negative.   Cardiovascular: Negative.   Gastrointestinal: Negative.   Endocrine: Negative.   Genitourinary: Negative.   Musculoskeletal: Negative.   Skin: Negative.   Allergic/Immunologic: Negative.   Neurological: Negative.   Hematological: Negative.   Psychiatric/Behavioral: Negative.   All other systems reviewed and are negative.  Past Medical History:  Diagnosis Date  . Anemia   . Anxiety   . Arthritis   . B12 deficiency   . Cellulitis of arm, left 10/2017   after cat scratch  . Cervical disc disease    a. 07/2003 ant cervical deomcpression and fusion C5-6/C6-7;  b. 09/2007 post cervical laminectomy C4-5 with scres and arthrodesis C4-C7.  Marland Kitchen Chest pain at rest 02/25/2013  .  Chronic lower back pain   . Complication of anesthesia    " i WOKE UP DURING A COLONOSCOPY "- 2008  . Depression   . Diverticulosis of colon   . GERD (gastroesophageal reflux disease)   . Gout   . Hyperlipidemia   . Hypertension   . Pleurisy   . Shortness of breath dyspnea    with exertion- "I cant exercise now"  . Tobacco abuse    a. 40 yrs, 1.5-3 ppd over that  time.    Social History   Socioeconomic History  . Marital status: Married    Spouse name: Not on file  . Number of children: Not on file  . Years of education: Not on file  . Highest education level: Not on file  Occupational History  . Not on file  Tobacco Use  . Smoking status: Current Some Day Smoker    Packs/day: 0.50    Years: 40.00    Pack years: 20.00    Types: Cigarettes  . Smokeless tobacco: Never Used  . Tobacco comment: He is smoking about 4-5 cigarettes, He had smoked up to 3 packs daily. 06/03/19  Substance and Sexual Activity  . Alcohol use: Yes    Comment: occasional  . Drug use: No  . Sexual activity: Not on file  Other Topics Concern  . Not on file  Social History Narrative   Lives in [place] with wife.  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   Social Determinants of Health   Financial Resource Strain:   . Difficulty of Paying Living Expenses:   Food Insecurity:   . Worried About Charity fundraiser in the Last Year:   . Arboriculturist in the Last Year:   Transportation Needs: No Transportation Needs  . Lack of Transportation (Medical): No  . Lack of Transportation (Non-Medical): No  Physical Activity:   . Days of Exercise per Week:   . Minutes of Exercise per Session:   Stress:   . Feeling of Stress :   Social Connections:   . Frequency of Communication with Friends and Family:   . Frequency of Social Gatherings with Friends and Family:   . Attends Religious Services:   . Active Member of Clubs or Organizations:   . Attends Archivist Meetings:   Marland Kitchen Marital Status:   Intimate Partner Violence: Not At Risk  . Fear of Current or Ex-Partner: No  . Emotionally Abused: No  . Physically Abused: No  . Sexually Abused: No    Past Surgical History:  Procedure Laterality Date  . ANTERIOR LATERAL LUMBAR FUSION 4 LEVELS Right 06/05/2014   Procedure: ANTERIOR LATERAL LUMBAR FUSION  LUMBAR ONE TO LUMBAR FIVE with  Percutaneous Pedicle Screws;  Surgeon: Erline Levine, MD;  Location: [facility];  Service: Neurosurgery;  Laterality: Right;  . CARPAL TUNNEL RELEASE Right   . CERVICAL DISC SURGERY     X 2  . COLONOSCOPY    . ELBOW ARTHROSCOPY Right   . KNEE ARTHROSCOPY Right 08/2013   torn menicus  . KNEE ARTHROSCOPY Left 11/2013   chip cartlidge,torn menicus  . LUMBAR LAMINECTOMY/DECOMPRESSION MICRODISCECTOMY Right 07/11/2013   Procedure: LUMBAR LAMINECTOMY/DECOMPRESSION MICRODISCECTOMY 1 LEVEL;  Surgeon: Erline Levine, MD;  Location: [facility];  Service: Neurosurgery;  Laterality: Right;  Right L34 microdiskectomy  . LUMBAR PERCUTANEOUS PEDICLE SCREW 4 LEVEL N/A 06/05/2014   Procedure: LUMBAR PERCUTANEOUS PEDICLE SCREW 4 LEVEL;  Surgeon: Erline Levine,  MD;  Location: [facility];  Service: Neurosurgery;  Laterality: N/A;  . MICROLARYNGOSCOPY Left 05/12/2019   Procedure: MICROLARYNGOSCOPY WITH BIOPSY;  Surgeon: Rozetta Nunnery, MD;  Location: [facility];  Service: ENT;  Laterality: Left;  . NECK SURGERY  January 2005, March 2009   C-Spine  . ROTATOR CUFF REPAIR  August 2000, January 2002, July 2008, July 2009   2 left 2 right    Family History  Problem Relation Age of Onset  . Bone cancer Mother   . Squamous cell carcinoma Mother        died @ 28  . Heart attack Mother   . Hypertension Father   . Diabetes Father        borderline  . Congestive Heart Failure Father        alive @ 42  . Heart failure Father   . Sudden death Brother        died @ 62  . Other Brother        died in [place] @ 57 - struck by drunk driver  . Other Brother        died in [place] @ 73 - struck by drunk driver  . Heart attack Brother   . Stroke Paternal Grandfather   . Colon cancer Neg Hx   . Esophageal cancer Neg Hx   . Rectal cancer Neg Hx   . Stomach cancer Neg Hx     Allergies  Allergen Reactions  . Lisinopril Swelling    Swollen Lip  . Percodan [Oxycodone-Aspirin] Rash    Percodan  caused rash---but he can tolerate Percocet and aspirin on their own     Current Outpatient Medications on File Prior to Visit  Medication Sig Dispense Refill  . albuterol (VENTOLIN HFA) 108 (90 Base) MCG/ACT inhaler Inhale 2 puffs into the lungs every 6 (six) hours as needed for wheezing or shortness of breath. 8 g 0  . allopurinol (ZYLOPRIM) 100 MG tablet TAKE 1 TABLET BY MOUTH EVERY DAY 90 tablet 0  . atorvastatin (LIPITOR) 40 MG tablet TAKE 1 TABLET BY MOUTH EVERY DAY 90 tablet 3  . colchicine 0.6 MG tablet TAKE 1 TABLET (0.6 MG TOTAL) BY MOUTH DAILY AS NEEDED (GOUT). 90 tablet 1  . cyclobenzaprine (FLEXERIL) 10 MG tablet Take 10 mg by mouth daily.    . diclofenac sodium (VOLTAREN) 1 % GEL Apply 2 g topically as needed (for pain).   0  . diltiazem (CARDIZEM CD) 360 MG 24 hr capsule TAKE 1 CAPSULE BY MOUTH EVERY DAY 90 capsule 0  . diltiazem (TIAZAC) 240 MG 24 hr capsule diltiazem CD 240 mg capsule,extended release 24 hr    . HYDROcodone-acetaminophen (NORCO/VICODIN) 5-325 MG tablet Take 1 tablet by mouth every 8 (eight) hours as needed for moderate pain.   0  . lidocaine (XYLOCAINE) 2 % solution Patient: Mix 1part 2% viscous lidocaine, 1part H20. Swish & swallow 20mL of diluted mixture, 49min before meals and at bedtime, up to QID 300 mL 3  . losartan (COZAAR) 100 MG tablet Take 1 tablet (100 mg total) by mouth daily. 90 tablet 1  . omeprazole (PRILOSEC OTC) 20 MG tablet Take 2 tablets (40 mg total) by mouth daily.    . Omeprazole (PRILOSEC PO) omeprazole    . sertraline (ZOLOFT) 100 MG tablet Take 1 tablet (100 mg total) by mouth daily. 90 tablet 1  . vitamin B-12 (CYANOCOBALAMIN) 1000 MCG tablet Take 1,000 mcg by mouth daily.    Marland Kitchen  valACYclovir (VALTREX) 1000 MG tablet Take 1 tablet (1,000 mg total) by mouth 3 (three) times daily. For shingles (Patient not taking: Reported on 11/11/2019) 21 tablet 0   No current facility-administered medications on file prior to visit.    BP (!) 158/92  (BP Location: Left Arm, Patient Position: Sitting, Cuff Size: Normal)   Pulse 81   Temp 97.7 F (36.5 C) (Temporal)   Ht 5\' 7"  (1.702 m)   Wt 175 lb 9.6 oz (79.7 kg)   SpO2 97%   BMI 27.50 kg/m       Objective:   Physical Exam Vitals and nursing note reviewed.  Constitutional:      General: He is not in acute distress.    Appearance: Normal appearance. He is well-developed and normal weight.  HENT:     Head: Normocephalic and atraumatic.     Right Ear: Tympanic membrane, ear canal and external ear normal. There is no impacted cerumen.     Left Ear: Tympanic membrane, ear canal and external ear normal. There is no impacted cerumen.     Nose: Nose normal. No congestion or rhinorrhea.     Mouth/Throat:     Mouth: Mucous membranes are moist.     Pharynx: Oropharynx is clear. No oropharyngeal exudate or posterior oropharyngeal erythema.  Eyes:     General:        Right eye: No discharge.        Left eye: No discharge.     Extraocular Movements: Extraocular movements intact.     Conjunctiva/sclera: Conjunctivae normal.     Pupils: Pupils are equal, round, and reactive to light.  Neck:     Vascular: No carotid bruit.     Trachea: No tracheal deviation.  Cardiovascular:     Rate and Rhythm: Normal rate and regular rhythm.     Pulses: Normal pulses.     Heart sounds: Normal heart sounds. No murmur. No friction rub. No gallop.   Pulmonary:     Effort: Pulmonary effort is normal. No respiratory distress.     Breath sounds: Normal breath sounds. No stridor. No wheezing, rhonchi or rales.  Chest:     Chest wall: No tenderness.  Abdominal:     General: Bowel sounds are normal. There is no distension.     Palpations: Abdomen is soft. There is no mass.     Tenderness: There is no abdominal tenderness. There is no right CVA tenderness, left CVA tenderness, guarding or rebound.     Hernia: No hernia is present.  Musculoskeletal:        General: No swelling, tenderness, deformity or  signs of injury. Normal range of motion.     Right lower leg: No edema.     Left lower leg: No edema.  Lymphadenopathy:     Cervical: No cervical adenopathy.  Skin:    General: Skin is warm and dry.     Capillary Refill: Capillary refill takes less than 2 seconds.     Coloration: Skin is not jaundiced or pale.     Findings: No bruising, erythema, lesion or rash.  Neurological:     General: No focal deficit present.     Mental Status: He is alert and oriented to person, place, and time.     Cranial Nerves: No cranial nerve deficit.     Sensory: No sensory deficit.     Motor: No weakness.     Coordination: Coordination normal.     Gait: Gait normal.  Deep Tendon Reflexes: Reflexes normal.  Psychiatric:        Mood and Affect: Mood normal.        Behavior: Behavior normal.        Thought Content: Thought content normal.        Judgment: Judgment normal.       Assessment & Plan:  1. Routine general medical examination at a health care facility - Looks well.  - Needs to work on weight loss through diet and exercise  - Follow up in one year or sooner if needed - CBC with Differential/Platelet - Comprehensive metabolic panel - Lipid panel - TSH  2. Hyperlipidemia, unspecified hyperlipidemia type - Consider increasing statin and/or adding fenofibrate  - CBC with Differential/Platelet - Comprehensive metabolic panel - Lipid panel - TSH  3. Idiopathic gout, unspecified chronicity, unspecified site - Continue with Allopurinol   4. Anxiety disorder, unspecified type - Continue with Zoloft   5. Essential hypertension - No change in medications. Did not take medications this morning  - CBC with Differential/Platelet - Comprehensive metabolic panel - Lipid panel - TSH - diltiazem (CARDIZEM CD) 360 MG 24 hr capsule; TAKE 1 CAPSULE BY MOUTH EVERY DAY  Dispense: 90 capsule; Refill: 3 - losartan (COZAAR) 100 MG tablet; Take 1 tablet (100 mg total) by mouth daily.  Dispense:  90 tablet; Refill: 3  6. Prostate cancer screening  - PSA  7. Glottis carcinoma ([facility]) - Follow up with ENT and Oncology as directed  Dorothyann Peng, NP

## 2019-11-11 NOTE — Patient Instructions (Signed)
It was great seeing you today   Please work on weight loss through diet and exercise   We will follow up with you regarding your blood work

## 2019-11-12 ENCOUNTER — Other Ambulatory Visit: Payer: Self-pay

## 2019-11-12 ENCOUNTER — Other Ambulatory Visit: Payer: Self-pay | Admitting: Adult Health

## 2019-11-12 MED ORDER — ATORVASTATIN CALCIUM 80 MG PO TABS: 80.0000 mg | ORAL_TABLET | Freq: Every day | ORAL | 3 refills | Status: DC

## 2019-11-12 NOTE — Telephone Encounter (Signed)
Forwarding to PCP for approval  

## 2019-11-26 ENCOUNTER — Encounter (INDEPENDENT_AMBULATORY_CARE_PROVIDER_SITE_OTHER): Payer: Self-pay | Admitting: Otolaryngology

## 2019-11-26 ENCOUNTER — Other Ambulatory Visit: Payer: Self-pay

## 2019-11-26 ENCOUNTER — Ambulatory Visit (INDEPENDENT_AMBULATORY_CARE_PROVIDER_SITE_OTHER): Payer: 59 | Admitting: Otolaryngology

## 2019-11-26 ENCOUNTER — Other Ambulatory Visit (INDEPENDENT_AMBULATORY_CARE_PROVIDER_SITE_OTHER): Payer: Self-pay

## 2019-11-26 VITALS — Temp 97.3°F

## 2019-11-26 DIAGNOSIS — Z8521 Personal history of malignant neoplasm of larynx: Secondary | ICD-10-CM | POA: Diagnosis not present

## 2019-11-26 DIAGNOSIS — R591 Generalized enlarged lymph nodes: Secondary | ICD-10-CM

## 2019-11-26 DIAGNOSIS — R490 Dysphonia: Secondary | ICD-10-CM

## 2019-11-26 NOTE — Progress Notes (Signed)
HPI: Aaron Black is a 64 y.o. male who returns today for evaluation of hoarseness and a new nodule in his right neck that he recently felt.  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  He feels like his voice has gotten little bit worse recently.  He is also recently noted a nodule in the right lower neck that he has not noticed before.  Past Medical History:  Diagnosis Date  . Anemia   . Anxiety   . Arthritis   . B12 deficiency   . Cellulitis of arm, left 10/2017   after cat scratch  . Cervical disc disease    a. 07/2003 ant cervical deomcpression and fusion C5-6/C6-7;  b. 09/2007 post cervical laminectomy C4-5 with scres and arthrodesis C4-C7.  Marland Kitchen Chest pain at rest 02/25/2013  . Chronic lower back pain   . Complication of anesthesia    " i WOKE UP DURING A COLONOSCOPY "- 2008  . Depression   . Diverticulosis of colon   . GERD (gastroesophageal reflux disease)   . Gout   . Hyperlipidemia   . Hypertension   . Pleurisy   . Shortness of breath dyspnea    with exertion- "I cant exercise now"  . Tobacco abuse    a. 40 yrs, 1.5-3 ppd over that time.   Past Surgical History:  Procedure Laterality Date  . ANTERIOR LATERAL LUMBAR FUSION 4 LEVELS Right 06/05/2014   Procedure: ANTERIOR LATERAL LUMBAR FUSION  LUMBAR ONE TO LUMBAR FIVE with Percutaneous Pedicle Screws;  Surgeon: Erline Levine, MD;  Location: [facility];  Service: Neurosurgery;  Laterality: Right;  . CARPAL TUNNEL RELEASE Right   . CERVICAL DISC SURGERY     X 2  . COLONOSCOPY    . ELBOW ARTHROSCOPY Right   . KNEE ARTHROSCOPY Right 08/2013   torn menicus  . KNEE ARTHROSCOPY Left 11/2013   chip cartlidge,torn menicus  . LUMBAR LAMINECTOMY/DECOMPRESSION MICRODISCECTOMY Right 07/11/2013   Procedure: LUMBAR LAMINECTOMY/DECOMPRESSION MICRODISCECTOMY 1 LEVEL;  Surgeon: Erline Levine, MD;  Location: [facility];  Service: Neurosurgery;  Laterality: Right;  Right L34 microdiskectomy  . LUMBAR PERCUTANEOUS PEDICLE SCREW 4 LEVEL N/A 06/05/2014   Procedure: LUMBAR PERCUTANEOUS PEDICLE SCREW 4 LEVEL;  Surgeon: Erline Levine, MD;  Location: [facility];  Service: Neurosurgery;  Laterality: N/A;  . MICROLARYNGOSCOPY Left 05/12/2019   Procedure: MICROLARYNGOSCOPY WITH BIOPSY;  Surgeon: Rozetta Nunnery, MD;  Location: [facility];  Service: ENT;  Laterality: Left;  . NECK SURGERY  January 2005, March 2009   C-Spine  . ROTATOR CUFF REPAIR  August 2000, January 2002, July 2008, July 2009   2 left 2 right   Social History   Socioeconomic History  . Marital status: Married    Spouse name: Not on file  . Number of children: Not on file  . Years of education: Not on file  . Highest education level: Not on file  Occupational History  . Not on file  Tobacco Use  . Smoking status: Former Smoker    Packs/day: 1.00    Years: 42.00    Pack years: 42.00    Types: Cigarettes    Start date: 1979    Quit date: 06/08/2019    Years since quitting: 0.4  . Smokeless tobacco: Never Used  . Tobacco comment: He is smoking about 4-5 cigarettes, He had  smoked up to 3 packs daily. 06/03/19  Substance and Sexual Activity  . Alcohol use: Yes    Comment: occasional  . Drug use: No  . Sexual activity: Not on file  Other Topics Concern  . Not on file  Social History Narrative   Lives in [place] with wife.  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   Social Determinants of Health   Financial Resource Strain:   . Difficulty of Paying Living Expenses:   Food Insecurity:   . Worried About Charity fundraiser in the Last Year:   . Arboriculturist in the Last Year:   Transportation Needs: No Transportation Needs  . Lack of Transportation (Medical): No  . Lack of Transportation (Non-Medical): No  Physical Activity:   . Days of Exercise per Week:   . Minutes of  Exercise per Session:   Stress:   . Feeling of Stress :   Social Connections:   . Frequency of Communication with Friends and Family:   . Frequency of Social Gatherings with Friends and Family:   . Attends Religious Services:   . Active Member of Clubs or Organizations:   . Attends Archivist Meetings:   Marland Kitchen Marital Status:    Family History  Problem Relation Age of Onset  . Bone cancer Mother   . Squamous cell carcinoma Mother        died @ 56  . Heart attack Mother   . Hypertension Father   . Diabetes Father        borderline  . Congestive Heart Failure Father        alive @ 6  . Heart failure Father   . Sudden death Brother        died @ 5  . Other Brother        died in [place] @ 42 - struck by drunk driver  . Other Brother        died in [place] @ 99 - struck by drunk driver  . Heart attack Brother   . Stroke Paternal Grandfather   . Colon cancer Neg Hx   . Esophageal cancer Neg Hx   . Rectal cancer Neg Hx   . Stomach cancer Neg Hx    Allergies  Allergen Reactions  . Lisinopril Swelling    Swollen Lip  . Percodan [Oxycodone-Aspirin] Rash    Percodan caused rash---but he can tolerate Percocet and aspirin on their own    Prior to Admission medications   Medication Sig Start Date End Date Taking? Authorizing Provider  albuterol (VENTOLIN HFA) 108 (90 Base) MCG/ACT inhaler Inhale 2 puffs into the lungs every 6 (six) hours as needed for wheezing or shortness of breath. 04/12/19  Yes Burky, Lanelle Bal B, NP  allopurinol (ZYLOPRIM) 100 MG tablet TAKE 1 TABLET BY MOUTH EVERY DAY 10/10/19  Yes Nafziger, Tommi Rumps, NP  atorvastatin (LIPITOR) 80 MG tablet Take 1 tablet (80 mg total) by mouth daily. 11/12/19  Yes Nafziger, Tommi Rumps, NP  colchicine 0.6 MG tablet TAKE 1 TABLET (0.6 MG TOTAL) BY MOUTH DAILY AS NEEDED (GOUT). 03/20/18  Yes Marletta Lor, MD  cyclobenzaprine (FLEXERIL) 10 MG tablet Take 10 mg by mouth daily. 12/27/18  Yes [provider]  diclofenac sodium  (VOLTAREN) 1 % GEL Apply 2 g topically as needed (for pain).  04/01/17  Yes [provider]  diltiazem (CARDIZEM CD) 360 MG 24 hr capsule TAKE 1 CAPSULE BY MOUTH EVERY  DAY 11/11/19  Yes Nafziger, Tommi Rumps, NP  HYDROcodone-acetaminophen (NORCO/VICODIN) 5-325 MG tablet Take 1 tablet by mouth every 8 (eight) hours as needed for moderate pain.  09/24/17  Yes [provider]  lidocaine (XYLOCAINE) 2 % solution Patient: Mix 1part 2% viscous lidocaine, 1part H20. Swish & swallow 16mL of diluted mixture, 51min before meals and at bedtime, up to QID 07/07/19  Yes Eppie Gibson, MD  losartan (COZAAR) 100 MG tablet Take 1 tablet (100 mg total) by mouth daily. 11/11/19  Yes Nafziger, Tommi Rumps, NP  omeprazole (PRILOSEC OTC) 20 MG tablet Take 2 tablets (40 mg total) by mouth daily. 02/27/13  Yes Viyuoh, Adeline C, MD  sertraline (ZOLOFT) 100 MG tablet Take 1 tablet (100 mg total) by mouth daily. 09/03/19  Yes Nafziger, Tommi Rumps, NP  sertraline (ZOLOFT) 50 MG tablet TAKE 1 TABLET BY MOUTH EVERY DAY 11/12/19  Yes Nafziger, Tommi Rumps, NP  vitamin B-12 (CYANOCOBALAMIN) 1000 MCG tablet Take 1,000 mcg by mouth daily.   Yes [provider]     Positive ROS: Otherwise negative  All other systems have been reviewed and were otherwise negative with the exception of those mentioned in the HPI and as above.  Physical Exam: Constitutional: Alert, well-appearing, no acute distress.  Mild gravelly voice.  No airway problems Ears: External ears without lesions or tenderness. Ear canals are clear bilaterally with intact, clear TMs.  Nasal: External nose without lesions. Septum slightly deviated to the left.. Clear nasal passages Oral: Lips and gums without lesions. Tongue and palate mucosa without lesions. Posterior oropharynx clear.  Indirect laryngoscopy revealed clear base of tongue vallecula epiglottis. Fiberoptic laryngoscopy was performed through the right nostril.  On fiberoptic laryngoscopy the nasopharynx was clear.   Hypopharynx was clear.  On close evaluation of the vocal cords he has slight edema of the vocal cords but no obvious vocal cord lesions noted vocal cords had symmetric mobility. Neck: He does have a fullness or nodule deep to the sternocleidomastoid muscle on the right side.  Is difficult to adequately palpate this I would estimate measures 1-1/2 cm in size.  Difficult to palpate because it is deep to the sternocleidomastoid muscle.  No supraclavicular adenopathy easily palpable. Respiratory: Breathing comfortably  Skin: No facial/neck lesions or rash noted.  Laryngoscopy  Date/Time: 11/26/2019 9:58 AM Performed by: Rozetta Nunnery, MD Authorized by: Rozetta Nunnery, MD   Consent:    Consent obtained:  Verbal   Consent given by:  Patient Procedure details:    Indications: oncologic surveillance follow-up     Medication:  Afrin   Scope location: right nare   Mouth:    Oropharynx: normal     Vallecula: normal     Base of tongue: normal     Epiglottis: normal   Throat:    True vocal cords: normal   Comments:     On fiberoptic laryngoscopy vocal cords were slightly edematous but no specific vocal cord lesions noted.  Vocal cords had symmetric mobility.    Assessment: Hoarseness. History of T1 vocal cord cancer status post radiation therapy completed 6 months ago. Right neck node  Plan: We will plan on scheduling CT scan with contrast of the neck to evaluate right neck node. On clinical exam no evidence of persistent cancer on the vocal cords.   Radene Journey, MD

## 2019-12-01 ENCOUNTER — Ambulatory Visit: Payer: 59 | Attending: Internal Medicine

## 2019-12-01 DIAGNOSIS — Z23 Encounter for immunization: Secondary | ICD-10-CM

## 2019-12-01 NOTE — Progress Notes (Signed)
   Covid-19 Vaccination Clinic  Name:  Aaron Black    MRN: [id number] DOB: Jul 28, 1955  12/01/2019  Mr. Aaron Black was observed post Covid-19 immunization for 15 minutes without incident. He was provided with Vaccine Information Sheet and instruction to access the V-Safe system.   Mr. Aaron Black was instructed to call 911 with any severe reactions post vaccine: Marland Kitchen Difficulty breathing  . Swelling of face and throat  . A fast heartbeat  . A bad rash all over body  . Dizziness and weakness   Immunizations Administered    Name Date Dose VIS Date Route   Pfizer COVID-19 Vaccine 12/01/2019  8:48 AM 0.3 mL 09/17/2018 Intramuscular   Manufacturer: [place]   Lot: [id number]   Noel: [id number]

## 2019-12-12 ENCOUNTER — Ambulatory Visit
Admission: RE | Admit: 2019-12-12 | Discharge: 2019-12-12 | Disposition: A | Discharge status: Home / Self Care | Payer: 59 | Source: Ambulatory Visit | Attending: Otolaryngology | Admitting: Otolaryngology

## 2019-12-12 DIAGNOSIS — R490 Dysphonia: Secondary | ICD-10-CM

## 2019-12-12 DIAGNOSIS — R591 Generalized enlarged lymph nodes: Secondary | ICD-10-CM

## 2019-12-12 MED ORDER — IOPAMIDOL (ISOVUE-300) INJECTION 61%
75.0000 mL | Freq: Once | INTRAVENOUS | Status: AC | PRN
  Administered 2019-12-12: 75 mL via INTRAVENOUS

## 2019-12-16 ENCOUNTER — Telehealth (INDEPENDENT_AMBULATORY_CARE_PROVIDER_SITE_OTHER): Payer: Self-pay | Admitting: Otolaryngology

## 2019-12-16 NOTE — Telephone Encounter (Signed)
Called Aaron Black concerning results of the CT scan that Daryk had for evaluation of right neck nodule.  The CT scan demonstrated no significant lymphadenopathy in the neck.  Also reviewed the larynx and vocal cords appeared within normal limits bilaterally. Reassured them of normal examination.

## 2020-01-02 DIAGNOSIS — C140 Malignant neoplasm of pharynx, unspecified: Secondary | ICD-10-CM | POA: Diagnosis not present

## 2020-01-02 DIAGNOSIS — M150 Primary generalized (osteo)arthritis: Secondary | ICD-10-CM | POA: Diagnosis not present

## 2020-01-02 DIAGNOSIS — N183 Chronic kidney disease, stage 3 unspecified: Secondary | ICD-10-CM | POA: Diagnosis not present

## 2020-01-02 DIAGNOSIS — Z6825 Body mass index (BMI) 25.0-25.9, adult: Secondary | ICD-10-CM | POA: Diagnosis not present

## 2020-01-02 DIAGNOSIS — M5030 Other cervical disc degeneration, unspecified cervical region: Secondary | ICD-10-CM | POA: Diagnosis not present

## 2020-01-02 DIAGNOSIS — E663 Overweight: Secondary | ICD-10-CM | POA: Diagnosis not present

## 2020-01-02 DIAGNOSIS — M1A09X0 Idiopathic chronic gout, multiple sites, without tophus (tophi): Secondary | ICD-10-CM | POA: Diagnosis not present

## 2020-01-04 ENCOUNTER — Other Ambulatory Visit: Payer: Self-pay | Admitting: Adult Health

## 2020-01-05 NOTE — Telephone Encounter (Signed)
Sent to the pharmacy by e-scribe. 

## 2020-02-23 ENCOUNTER — Other Ambulatory Visit: Payer: Self-pay | Admitting: Adult Health

## 2020-02-23 ENCOUNTER — Encounter: Payer: Self-pay | Admitting: Internal Medicine

## 2020-02-23 DIAGNOSIS — F419 Anxiety disorder, unspecified: Secondary | ICD-10-CM

## 2020-02-25 NOTE — Telephone Encounter (Signed)
Sent to the pharmacy by e-scribe. 

## 2020-03-01 ENCOUNTER — Ambulatory Visit (INDEPENDENT_AMBULATORY_CARE_PROVIDER_SITE_OTHER): Payer: 59 | Admitting: Otolaryngology

## 2020-04-01 NOTE — Progress Notes (Signed)
Aaron Black presents today for follow-up of radiation to his larynx completed on 07/17/2019  Pain issues, if any: Patient denies Using a feeding tube?: N/A Weight changes, if any:  Wt Readings from Last 3 Encounters:  04/02/20 168 lb 8 oz (76.4 kg)  11/11/19 175 lb 9.6 oz (79.7 kg)  06/04/19 172 lb 3.2 oz (78.1 kg)   Swallowing issues, if any: Occasionally gets choked up Smoking or chewing tobacco? Started smoking ~6 cigarettes a day when he had to discontinue is Xanax prescription because of hydrocodone use.  Using fluoride trays daily? N/A Last ENT visit was on: 11/23/2019 Saw Dr. Harrell Gave Newman:"Neck: He does have a fullness or nodule deep to the sternocleidomastoid muscle on the right side.  Is difficult to adequately palpate this I would estimate measures 1-1/2 cm in size.  Difficult to palpate because it is deep to the sternocleidomastoid muscle.  No supraclavicular adenopathy easily palpable. On fiberoptic laryngoscopy vocal cords were slightly edematous but no specific vocal cord lesions noted.  Vocal cords had symmetric mobility. Plan: We will plan on scheduling CT scan with contrast of the neck to evaluate right neck node. On clinical exam no evidence of persistent cancer on the vocal cords"  Other notable issues, if any:  Had root canal and is taking amoxicillin for 3 weeks. Concerned about persistent vocal hoarseness. Scheduled for colonoscopy soon. Otherwise doing well and has no complaints CT scan 12/12/2019  IMPRESSION: No neck mass or adenopathy  Vitals:   04/02/20 1108  BP: (!) 158/88  Pulse: 88  Resp: 18  Temp: 98.5 F (36.9 C)  SpO2: 99%

## 2020-04-02 ENCOUNTER — Ambulatory Visit
Admission: RE | Admit: 2020-04-02 | Discharge: 2020-04-02 | Disposition: A | Discharge status: Home / Self Care | Payer: No Typology Code available for payment source | Source: Ambulatory Visit | Attending: Radiation Oncology | Admitting: Radiation Oncology

## 2020-04-02 ENCOUNTER — Other Ambulatory Visit: Payer: Self-pay

## 2020-04-02 VITALS — BP 158/88 | HR 88 | Temp 98.5°F | Resp 18 | Ht 67.0 in | Wt 168.5 lb

## 2020-04-02 DIAGNOSIS — F17210 Nicotine dependence, cigarettes, uncomplicated: Secondary | ICD-10-CM | POA: Diagnosis not present

## 2020-04-02 DIAGNOSIS — R69 Illness, unspecified: Secondary | ICD-10-CM | POA: Diagnosis not present

## 2020-04-02 DIAGNOSIS — Z716 Tobacco abuse counseling: Secondary | ICD-10-CM | POA: Diagnosis not present

## 2020-04-02 DIAGNOSIS — Z923 Personal history of irradiation: Secondary | ICD-10-CM | POA: Diagnosis not present

## 2020-04-02 DIAGNOSIS — Z08 Encounter for follow-up examination after completed treatment for malignant neoplasm: Secondary | ICD-10-CM | POA: Diagnosis not present

## 2020-04-02 DIAGNOSIS — Z8521 Personal history of malignant neoplasm of larynx: Secondary | ICD-10-CM | POA: Insufficient documentation

## 2020-04-02 DIAGNOSIS — C320 Malignant neoplasm of glottis: Secondary | ICD-10-CM

## 2020-04-02 MED ORDER — LARYNGOSCOPY SOLUTION RAD-ONC
15.0000 mL | Freq: Once | TOPICAL | Status: AC
  Administered 2020-04-02: 15 mL via TOPICAL
  Filled 2020-04-02: qty 15

## 2020-04-02 NOTE — Progress Notes (Signed)
Radiation Oncology         [phone] ________________________________  Name: Aaron Black MRN: [id number]  Date: 04/02/2020  DOB: 02-16-1956  Follow-Up Visit Note - Outpatient  CC: Dorothyann Peng, NP  Rozetta Nunnery, *  Diagnosis and Prior Radiotherapy:       ICD-10-CM   1. Glottis carcinoma (HCC)  C32.0 laryngocopy solution for Rad-Onc    Fiberoptic laryngoscopy    Radiation Treatment Dates: 06/09/2019 through 07/17/2019  Site Technique Total Dose (Gy) Dose per Fx (Gy) Completed Fx Beam Energies  Larynx: HN_larynx 3D 63/63 2.25 28/28 6X   CHIEF COMPLAINT:  Here for follow-up and surveillance of throat cancer  Narrative:    Aaron Black presents today for follow-up of radiation to his larynx completed on 07/17/2019  Pain issues, if any: Patient denies Using a feeding tube?: N/A Weight changes, if any:  Wt Readings from Last 3 Encounters:  04/02/20 168 lb 8 oz (76.4 kg)  11/11/19 175 lb 9.6 oz (79.7 kg)  06/04/19 172 lb 3.2 oz (78.1 kg)   Swallowing issues, if any: Occasionally gets choked up Smoking or chewing tobacco? Started smoking ~6 cigarettes a day when he had to discontinue is Xanax prescription because of hydrocodone use.  Using fluoride trays daily? N/A Last ENT visit was on: 11/23/2019 Saw Dr. Harrell Gave Newman:"Neck: He does have a fullness or nodule deep to the sternocleidomastoid muscle on the right side.  Is difficult to adequately palpate this I would estimate measures 1-1/2 cm in size.  Difficult to palpate because it is deep to the sternocleidomastoid muscle.  No supraclavicular adenopathy easily palpable. On fiberoptic laryngoscopy vocal cords were slightly edematous but no specific vocal cord lesions noted.  Vocal cords had symmetric mobility. Plan: We will plan on scheduling CT scan with contrast of the neck to evaluate right neck node. On clinical exam no evidence of persistent cancer on the vocal cords"  Other notable issues, if any:  Had  root canal and is taking amoxicillin for 3 weeks. Concerned about persistent vocal hoarseness. Scheduled for colonoscopy soon. Otherwise doing well and has no complaints CT scan 12/12/2019  IMPRESSION: No neck mass or adenopathy          ALLERGIES:  is allergic to lisinopril and percodan [oxycodone-aspirin].  Meds: Current Outpatient Medications  Medication Sig Dispense Refill  . albuterol (VENTOLIN HFA) 108 (90 Base) MCG/ACT inhaler Inhale 2 puffs into the lungs every 6 (six) hours as needed for wheezing or shortness of breath. 8 g 0  . allopurinol (ZYLOPRIM) 100 MG tablet TAKE 1 TABLET BY MOUTH EVERY DAY 90 tablet 1  . amoxicillin (AMOXIL) 500 MG tablet Take 500 mg by mouth 3 (three) times daily.    Marland Kitchen atorvastatin (LIPITOR) 80 MG tablet Take 1 tablet (80 mg total) by mouth daily. 90 tablet 3  . colchicine 0.6 MG tablet TAKE 1 TABLET (0.6 MG TOTAL) BY MOUTH DAILY AS NEEDED (GOUT). 90 tablet 1  . cyclobenzaprine (FLEXERIL) 10 MG tablet Take 10 mg by mouth daily.    . diclofenac sodium (VOLTAREN) 1 % GEL Apply 2 g topically as needed (for pain).   0  . diltiazem (CARDIZEM CD) 360 MG 24 hr capsule TAKE 1 CAPSULE BY MOUTH EVERY DAY 90 capsule 3  . HYDROcodone-acetaminophen (NORCO/VICODIN) 5-325 MG tablet Take 1 tablet by mouth every 8 (eight) hours as needed for moderate pain.   0  . lidocaine (XYLOCAINE) 2 % solution Patient: Mix 1part 2% viscous lidocaine, 1part H20.  Swish & swallow 8mL of diluted mixture, 57min before meals and at bedtime, up to QID 300 mL 3  . losartan (COZAAR) 100 MG tablet Take 1 tablet (100 mg total) by mouth daily. 90 tablet 3  . omeprazole (PRILOSEC OTC) 20 MG tablet Take 2 tablets (40 mg total) by mouth daily.    . sertraline (ZOLOFT) 100 MG tablet TAKE 1 TABLET BY MOUTH EVERY DAY 90 tablet 1  . vitamin B-12 (CYANOCOBALAMIN) 1000 MCG tablet Take 1,000 mcg by mouth daily.     No current facility-administered medications for this encounter.    Physical  Findings: The patient is in no acute distress. Patient is alert and oriented. Wt Readings from Last 3 Encounters:  04/02/20 168 lb 8 oz (76.4 kg)  11/11/19 175 lb 9.6 oz (79.7 kg)  06/04/19 172 lb 3.2 oz (78.1 kg)    height is 5\' 7"  (1.702 m) and weight is 168 lb 8 oz (76.4 kg). His oral temperature is 98.5 F (36.9 C). His blood pressure is 158/88 (abnormal) and his pulse is 88. His respiration is 18 and oxygen saturation is 99%. .  General: Alert and oriented, in no acute distress HEENT: Head is normocephalic. Extraocular movements are intact. Oropharynx is clear. Neck: Neck is supple, no palpable cervical or supraclavicular lymphadenopathy. Extremities: No cyanosis or edema. Lymphatics: see Neck Exam Neurologic: Cranial nerves II through XII are grossly intact. No obvious focalities. Speech is fluent. Coordination is intact. Psychiatric: Judgment and insight are intact. Affect is appropriate.   PROCEDURE NOTE: After obtaining consent and anesthetizing the nasal cavity with topical lidocaine and phenylephrine, the flexible endoscope was introduced and passed through the nasal cavity.  No lesions seen in the pharynx or larynx.  The cords are symmetrically mobile without any nodules.  He tolerated the procedure well.  Lab Findings: Lab Results  Component Value Date   WBC 6.0 11/11/2019   HGB 13.2 11/11/2019   HCT 39.7 11/11/2019   MCV 90.4 11/11/2019   PLT 213.0 11/11/2019    Lab Results  Component Value Date   TSH 0.86 11/11/2019    Radiographic Findings: No results found.  Impression/Plan:    1) Head and Neck Cancer Status: NED  2) Nutritional Status: denies issues PEG tube: none  3) Risk Factors: The patient has been educated about risk factors including alcohol and tobacco abuse; they understand that avoidance of alcohol and tobacco is important to prevent recurrences as well as other cancers.   PCP conducting LDCT of lungs annually. The patient uses tobacco.  I  advised the patient to quit. Services were offered by me today including outpatient counseling and pharmacotherapy. I assessed for the willingness to attempt to quit and provided encouragement and demonstrated willingness to make referrals and/or prescriptions to help the patient attempt to quit. The patient has follow-up with the oncologic team to touch base on their tobacco use and /or cessation efforts.  Over 3 minutes were spent on this issue.  His plan is to use Nicorette gum and call [address] for additional support.  Quit date is today.  4) Swallowing: good function  5)  Thyroid function:  WNL, in April he knows to continue this lab annually w/ PCP as RT can increase risk of hypothyroidism. Lab Results  Component Value Date   TSH 0.86 11/11/2019    6) Other: followup with Dr Lucia Gaskins for surveillance as scheduled. Followup in rad/onc in 6 months. The patient was encouraged to call with any issues  or questions before then.  7) We discussed measures to reduce the risk of infection during the COVID-19 pandemic.  I recommend that he look into getting the booster shot, particularly by early November given the timing of his previous shot.  He knows to discuss this further with his PCP.  On date of service, in total, I spent 30 minutes on this encounter. Patient was seen in person.  _____________________________________   Eppie Gibson, MD

## 2020-04-05 DIAGNOSIS — Z6824 Body mass index (BMI) 24.0-24.9, adult: Secondary | ICD-10-CM | POA: Diagnosis not present

## 2020-04-05 DIAGNOSIS — N183 Chronic kidney disease, stage 3 unspecified: Secondary | ICD-10-CM | POA: Diagnosis not present

## 2020-04-05 DIAGNOSIS — M5030 Other cervical disc degeneration, unspecified cervical region: Secondary | ICD-10-CM | POA: Diagnosis not present

## 2020-04-05 DIAGNOSIS — C140 Malignant neoplasm of pharynx, unspecified: Secondary | ICD-10-CM | POA: Diagnosis not present

## 2020-04-05 DIAGNOSIS — M1A09X0 Idiopathic chronic gout, multiple sites, without tophus (tophi): Secondary | ICD-10-CM | POA: Diagnosis not present

## 2020-04-05 DIAGNOSIS — M150 Primary generalized (osteo)arthritis: Secondary | ICD-10-CM | POA: Diagnosis not present

## 2020-04-12 ENCOUNTER — Other Ambulatory Visit: Payer: Self-pay

## 2020-04-12 ENCOUNTER — Ambulatory Visit (AMBULATORY_SURGERY_CENTER): Payer: Self-pay | Admitting: *Deleted

## 2020-04-12 VITALS — Ht 67.0 in | Wt 168.0 lb

## 2020-04-12 DIAGNOSIS — Z86010 Personal history of colonic polyps: Secondary | ICD-10-CM

## 2020-04-12 MED ORDER — SUTAB 1479-225-188 MG PO TABS: 24.0000 | ORAL_TABLET | ORAL | 0 refills | Status: DC

## 2020-04-12 NOTE — Progress Notes (Signed)
cov vax xx 2  No egg or soy allergy known to patient  No issues with past sedation with any surgeries or procedures no intubation problems in the past  No FH of Malignant Hyperthermia No diet pills per patient No home 02 use per patient  No blood thinners per patient  Pt denies issues with constipation  No A fib or A flutter  EMMI video to pt or via [place] 19 guidelines implemented in [place] today with Pt and RN   [employer] given to pt in [facility] today , Code to Pharmacy - pt states he can swallow Sutab without difficulty   Due to the COVID-19 pandemic we are asking patients to follow these guidelines. Please only bring one care partner. Please be aware that your care partner may wait in the car in the parking lot or if they feel like they will be too hot to wait in the car, they may wait in the lobby on the 4th floor. All care partners are required to wear a mask the entire time (we do not have any that we can provide them), they need to practice social distancing, and we will do a Covid check for all patient's and care partners when you arrive. Also we will check their temperature and your temperature. If the care partner waits in their car they need to stay in the parking lot the entire time and we will call them on their cell phone when the patient is ready for discharge so they can bring the car to the front of the building. Also all patient's will need to wear a mask into building.

## 2020-04-21 ENCOUNTER — Other Ambulatory Visit: Payer: Self-pay | Admitting: Adult Health

## 2020-04-26 ENCOUNTER — Ambulatory Visit (AMBULATORY_SURGERY_CENTER): Payer: No Typology Code available for payment source | Admitting: Internal Medicine

## 2020-04-26 ENCOUNTER — Encounter: Payer: Self-pay | Admitting: Internal Medicine

## 2020-04-26 ENCOUNTER — Other Ambulatory Visit: Payer: Self-pay

## 2020-04-26 VITALS — BP 154/95 | HR 77 | Temp 97.8°F | Resp 22 | Ht 67.0 in | Wt 168.0 lb

## 2020-04-26 DIAGNOSIS — Z86010 Personal history of colonic polyps: Secondary | ICD-10-CM | POA: Diagnosis not present

## 2020-04-26 DIAGNOSIS — D123 Benign neoplasm of transverse colon: Secondary | ICD-10-CM

## 2020-04-26 DIAGNOSIS — D122 Benign neoplasm of ascending colon: Secondary | ICD-10-CM | POA: Diagnosis not present

## 2020-04-26 DIAGNOSIS — Z1211 Encounter for screening for malignant neoplasm of colon: Secondary | ICD-10-CM | POA: Diagnosis not present

## 2020-04-26 DIAGNOSIS — D125 Benign neoplasm of sigmoid colon: Secondary | ICD-10-CM | POA: Diagnosis not present

## 2020-04-26 MED ORDER — SODIUM CHLORIDE 0.9 % IV SOLN: 500.0000 mL | Freq: Once | INTRAVENOUS | Status: DC

## 2020-04-26 NOTE — Progress Notes (Signed)
PT taken to PACU. Monitors in place. VSS. Report given to RN. 

## 2020-04-26 NOTE — Progress Notes (Signed)
Called to room to assist during endoscopic procedure.  Patient ID and intended procedure confirmed with present staff. Received instructions for my participation in the procedure from the performing physician.  

## 2020-04-26 NOTE — Op Note (Signed)
[facility] Patient Name: Aaron Black Procedure Date: 04/26/2020 8:00 AM MRN: [id number] Endoscopist: Docia Chuck. Henrene Pastor , MD Age: 64 Referring MD:  Date of Birth: [birth year] Gender: Male Account #: [id number] Procedure:                Colonoscopy with cold snare polypectomy x 4 Indications:              High risk colon cancer surveillance: Personal                            history of multiple (3 or more) adenomas. Index                            exam 2008 was negative for neoplasia. Subsequent                            examination 2018 with 4 polyps Medicines:                Monitored Anesthesia Care Procedure:                Pre-Anesthesia Assessment:                           - Prior to the procedure, a History and Physical                            was performed, and patient medications and                            allergies were reviewed. The patient's tolerance of                            previous anesthesia was also reviewed. The risks                            and benefits of the procedure and the sedation                            options and risks were discussed with the patient.                            All questions were answered, and informed consent                            was obtained. Prior Anticoagulants: The patient has                            taken no previous anticoagulant or antiplatelet                            agents. ASA Grade Assessment: II - A patient with                            mild systemic disease. After reviewing the risks  and benefits, the patient was deemed in                            satisfactory condition to undergo the procedure.                           After obtaining informed consent, the colonoscope                            was passed under direct vision. Throughout the                            procedure, the patient's blood pressure, pulse, and                            oxygen  saturations were monitored continuously. The                            Colonoscope was introduced through the anus and                            advanced to the the cecum, identified by                            appendiceal orifice and ileocecal valve. The                            ileocecal valve, appendiceal orifice, and rectum                            were photographed. The quality of the bowel                            preparation was excellent. The colonoscopy was                            performed without difficulty. The patient tolerated                            the procedure well. The bowel preparation used was                            SUPREP via split dose instruction. Scope In: 8:12:55 AM Scope Out: 8:28:11 AM Scope Withdrawal Time: 0 hours 11 minutes 57 seconds  Total Procedure Duration: 0 hours 15 minutes 16 seconds  Findings:                 Four polyps were found in the sigmoid colon,                            transverse colon and ascending colon. The polyps                            were 1 to 2 mm in size. These polyps were removed  with a cold snare. Resection and retrieval were                            complete.                           Multiple diverticula were found in the ascending                            colon and left colon.                           The exam was otherwise without abnormality on                            direct and retroflexion views. Complications:            No immediate complications. Estimated blood loss:                            None. Estimated Blood Loss:     Estimated blood loss: none. Impression:               - Four 1 to 2 mm polyps in the sigmoid colon, in                            the transverse colon and in the ascending colon,                            removed with a cold snare. Resected and retrieved.                           - Diverticulosis in the ascending colon and in the                             left colon.                           - The examination was otherwise normal on direct                            and retroflexion views. Recommendation:           - Repeat colonoscopy in 5 years for surveillance.                           - Patient has a contact number available for                            emergencies. The signs and symptoms of potential                            delayed complications were discussed with the                            patient. Return to normal activities tomorrow.  Written discharge instructions were provided to the                            patient.                           - Resume previous diet.                           - Continue present medications.                           - Await pathology results. Docia Chuck. Henrene Pastor, MD 04/26/2020 8:37:13 AM This report has been signed electronically.

## 2020-04-26 NOTE — Patient Instructions (Signed)
YOU HAD AN ENDOSCOPIC PROCEDURE TODAY AT THE [facility]:   Refer to the procedure report that was given to you for any specific questions about what was found during the examination.  If the procedure report does not answer your questions, please call your gastroenterologist to clarify.  If you requested that your care partner not be given the details of your procedure findings, then the procedure report has been included in a sealed envelope for you to review at your convenience later.  YOU SHOULD EXPECT: Some feelings of bloating in the abdomen. Passage of more gas than usual.  Walking can help get rid of the air that was put into your GI tract during the procedure and reduce the bloating. If you had a lower endoscopy (such as a colonoscopy or flexible sigmoidoscopy) you may notice spotting of blood in your stool or on the toilet paper. If you underwent a bowel prep for your procedure, you may not have a normal bowel movement for a few days.  **Handouts given on polyps and Diverticulosis**   Please Note:  You might notice some irritation and congestion in your nose or some drainage.  This is from the oxygen used during your procedure.  There is no need for concern and it should clear up in a day or so.  SYMPTOMS TO REPORT IMMEDIATELY:   Following lower endoscopy (colonoscopy or flexible sigmoidoscopy):  Excessive amounts of blood in the stool  Significant tenderness or worsening of abdominal pains  Swelling of the abdomen that is new, acute  Fever of 100F or higher   For urgent or emergent issues, a gastroenterologist can be reached at any hour by calling [phone]. Do not use MyChart messaging for urgent concerns.    DIET:  We do recommend a small meal at first, but then you may proceed to your regular diet.  Drink plenty of fluids but you should avoid alcoholic beverages for 24 hours.  ACTIVITY:  You should plan to take it easy for the rest of today and you should NOT  DRIVE or use heavy machinery until tomorrow (because of the sedation medicines used during the test).    FOLLOW UP: Our staff will call the number listed on your records 48-72 hours following your procedure to check on you and address any questions or concerns that you may have regarding the information given to you following your procedure. If we do not reach you, we will leave a message.  We will attempt to reach you two times.  During this call, we will ask if you have developed any symptoms of COVID 19. If you develop any symptoms (ie: fever, flu-like symptoms, shortness of breath, cough etc.) before then, please call [phone].  If you test positive for Covid 19 in the 2 weeks post procedure, please call and report this information to us.    If any biopsies were taken you will be contacted by phone or by letter within the next 1-3 weeks.  Please call us at [phone] if you have not heard about the biopsies in 3 weeks.    SIGNATURES/CONFIDENTIALITY: You and/or your care partner have signed paperwork which will be entered into your electronic medical record.  These signatures attest to the fact that that the information above on your After Visit Summary has been reviewed and is understood.  Full responsibility of the confidentiality of this discharge information lies with you and/or your care-partner. 

## 2020-04-26 NOTE — Progress Notes (Addendum)
Pt's states no medical or surgical changes since previsit or office visit.  Pt's b/p 168/100 on admission.  Pt said he did not take his b/p med this am.  AR - check-in CW - VS

## 2020-04-28 ENCOUNTER — Telehealth: Payer: Self-pay

## 2020-04-28 ENCOUNTER — Telehealth: Payer: Self-pay | Admitting: *Deleted

## 2020-04-28 NOTE — Telephone Encounter (Signed)
Left message on follow up call. 

## 2020-04-28 NOTE — Telephone Encounter (Signed)
  Follow up Call-  Call back number 04/26/2020  Post procedure Call Back phone  # [phone] cell  Permission to leave phone message Yes  Some recent data might be hidden     Patient questions:  Do you have a fever, pain , or abdominal swelling? No. Pain Score  0 *  Have you tolerated food without any problems? Yes.    Have you been able to return to your normal activities? Yes.    Do you have any questions about your discharge instructions: Diet   No. Medications  No. Follow up visit  No.  Do you have questions or concerns about your Care? No.  Actions: * If pain score is 4 or above: No action needed, pain <4.  1. Have you developed a fever since your procedure? no  2.   Have you had an respiratory symptoms (SOB or cough) since your procedure? no  3.   Have you tested positive for COVID 19 since your procedure no  4.   Have you had any family members/close contacts diagnosed with the COVID 19 since your procedure?  no   If yes to any of these questions please route to Joylene John, RN and Joella Prince, RN

## 2020-04-29 ENCOUNTER — Encounter: Payer: Self-pay | Admitting: Internal Medicine

## 2020-05-26 ENCOUNTER — Ambulatory Visit (INDEPENDENT_AMBULATORY_CARE_PROVIDER_SITE_OTHER): Payer: No Typology Code available for payment source | Admitting: Otolaryngology

## 2020-05-26 ENCOUNTER — Encounter (INDEPENDENT_AMBULATORY_CARE_PROVIDER_SITE_OTHER): Payer: Self-pay | Admitting: Otolaryngology

## 2020-05-26 VITALS — Temp 97.0°F

## 2020-05-26 DIAGNOSIS — Z8521 Personal history of malignant neoplasm of larynx: Secondary | ICD-10-CM | POA: Diagnosis not present

## 2020-05-26 NOTE — Progress Notes (Signed)
HPI: Aaron Black is a 64 y.o. 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  He completed radiation therapy 10 months ago.  He has been doing well does have intermittent hoarseness.  His voice sounds reasonably good today.  He is having no throat pain or discomfort.  He last underwent fiberoptic laryngoscopy with Dr. Isidore Moos in August with good exam. Patient has presently stopped smoking.  Past Medical History:  Diagnosis Date  . Anemia   . Anxiety   . Arthritis   . B12 deficiency   . Cancer (HCC)    throat cancer   . Cellulitis of arm, left 10/2017   after cat scratch  . Cervical disc disease    a. 07/2003 ant cervical deomcpression and fusion C5-6/C6-7;  b. 09/2007 post cervical laminectomy C4-5 with scres and arthrodesis C4-C7.  Marland Kitchen Chest pain at rest 02/25/2013  . Chronic lower back pain   . Complication of anesthesia    " i WOKE UP DURING A COLONOSCOPY "- 2008  . Depression   . Diverticulosis of colon   . GERD (gastroesophageal reflux disease)   . Gout   . Hyperlipidemia   . Hypertension   . Pleurisy   . Shortness of breath dyspnea    with exertion- "I cant exercise now"  . Tobacco abuse    a. 40 yrs, 1.5-3 ppd over that time.   Past Surgical History:  Procedure Laterality Date  . ANTERIOR LATERAL LUMBAR FUSION 4 LEVELS Right 06/05/2014   Procedure: ANTERIOR LATERAL LUMBAR FUSION  LUMBAR ONE TO LUMBAR FIVE with Percutaneous Pedicle Screws;  Surgeon: Erline Levine, MD;  Location: [facility];  Service: Neurosurgery;  Laterality: Right;  . CARPAL TUNNEL RELEASE Right   . CERVICAL DISC SURGERY     X 2  . COLONOSCOPY    . ELBOW ARTHROSCOPY Right   . KNEE ARTHROSCOPY Right 08/2013   torn menicus  . KNEE ARTHROSCOPY Left 11/2013   chip cartlidge,torn menicus  . LUMBAR LAMINECTOMY/DECOMPRESSION MICRODISCECTOMY Right 07/11/2013   Procedure: LUMBAR LAMINECTOMY/DECOMPRESSION  MICRODISCECTOMY 1 LEVEL;  Surgeon: Erline Levine, MD;  Location: [facility];  Service: Neurosurgery;  Laterality: Right;  Right L34 microdiskectomy  . LUMBAR PERCUTANEOUS PEDICLE SCREW 4 LEVEL N/A 06/05/2014   Procedure: LUMBAR PERCUTANEOUS PEDICLE SCREW 4 LEVEL;  Surgeon: Erline Levine, MD;  Location: [facility];  Service: Neurosurgery;  Laterality: N/A;  . MICROLARYNGOSCOPY Left 05/12/2019   Procedure: MICROLARYNGOSCOPY WITH BIOPSY;  Surgeon: Rozetta Nunnery, MD;  Location: [facility];  Service: ENT;  Laterality: Left;  . NECK SURGERY  January 2005, March 2009   C-Spine  . POLYPECTOMY    . ROTATOR CUFF REPAIR  August 2000, January 2002, July 2008, July 2009   2 left 2 right   Social History   Socioeconomic History  . Marital status: Married    Spouse name: Not on file  . Number of children: Not on file  . Years of education: Not on file  . Highest education level: Not on file  Occupational History  . Not on file  Tobacco Use  . Smoking status: Former Smoker    Packs/day: 1.00    Years: 42.00    Pack years: 42.00    Types: Cigarettes    Start date: 1979    Quit date: 06/08/2019    Years since quitting: 0.9  . Smokeless tobacco: Never  Used  . Tobacco comment: He is smoking about 4-5 cigarettes, He had smoked up to 3 packs daily. 06/03/19  Vaping Use  . Vaping Use: Never used  Substance and Sexual Activity  . Alcohol use: Yes    Comment: occasional  . Drug use: No  . Sexual activity: Not on file  Other Topics Concern  . Not on file  Social History Narrative   Lives in [place] with wife.  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   Social Determinants of Health   Financial Resource Strain:   . Difficulty of Paying Living Expenses: Not on file  Food Insecurity:   . Worried About Charity fundraiser in the Last Year: Not on file  . Ran Out of Food in the Last Year: Not on file  Transportation Needs: No Transportation Needs   . Lack of Transportation (Medical): No  . Lack of Transportation (Non-Medical): No  Physical Activity:   . Days of Exercise per Week: Not on file  . Minutes of Exercise per Session: Not on file  Stress:   . Feeling of Stress : Not on file  Social Connections:   . Frequency of Communication with Friends and Family: Not on file  . Frequency of Social Gatherings with Friends and Family: Not on file  . Attends Religious Services: Not on file  . Active Member of Clubs or Organizations: Not on file  . Attends Archivist Meetings: Not on file  . Marital Status: Not on file   Family History  Problem Relation Age of Onset  . Bone cancer Mother   . Squamous cell carcinoma Mother        died @ 58  . Heart attack Mother   . Hypertension Father   . Diabetes Father        borderline  . Congestive Heart Failure Father        alive @ 89  . Heart failure Father   . Sudden death Brother        died @ 10  . Other Brother        died in [place] @ 34 - struck by drunk driver  . Other Brother        died in [place] @ 11 - struck by drunk driver  . Heart attack Brother   . Stroke Paternal Grandfather   . Colon cancer Neg Hx   . Esophageal cancer Neg Hx   . Rectal cancer Neg Hx   . Stomach cancer Neg Hx   . Colon polyps Neg Hx    Allergies  Allergen Reactions  . Lisinopril Swelling    Swollen Lip  . Percodan [Oxycodone-Aspirin] Rash    Percodan caused rash---but he can tolerate Percocet and aspirin on their own    Prior to Admission medications   Medication Sig Start Date End Date Taking? Authorizing Provider  albuterol (VENTOLIN HFA) 108 (90 Base) MCG/ACT inhaler Inhale 2 puffs into the lungs every 6 (six) hours as needed for wheezing or shortness of breath. 04/12/19  Yes Burky, Lanelle Bal B, NP  allopurinol (ZYLOPRIM) 100 MG tablet TAKE 1 TABLET BY MOUTH EVERY DAY 04/21/20  Yes Nafziger, Tommi Rumps, NP  atorvastatin (LIPITOR) 80 MG tablet Take 1 tablet (80 mg total) by mouth daily. 11/12/19   Yes Nafziger, Tommi Rumps, NP  colchicine 0.6 MG tablet TAKE 1 TABLET (0.6 MG TOTAL) BY MOUTH DAILY AS NEEDED (GOUT). 03/20/18  Yes Marletta Lor, MD  cyclobenzaprine (  FLEXERIL) 10 MG tablet Take 10 mg by mouth daily. 12/27/18  Yes [provider]  diclofenac sodium (VOLTAREN) 1 % GEL Apply 2 g topically as needed (for pain).  04/01/17  Yes [provider]  diltiazem (CARDIZEM CD) 360 MG 24 hr capsule TAKE 1 CAPSULE BY MOUTH EVERY DAY 11/11/19  Yes Nafziger, Tommi Rumps, NP  HYDROcodone-acetaminophen (NORCO/VICODIN) 5-325 MG tablet Take 1 tablet by mouth every 8 (eight) hours as needed for moderate pain.  09/24/17  Yes [provider]  lidocaine (XYLOCAINE) 2 % solution Patient: Mix 1part 2% viscous lidocaine, 1part H20. Swish & swallow 86mL of diluted mixture, 26min before meals and at bedtime, up to QID 07/07/19  Yes Eppie Gibson, MD  losartan (COZAAR) 100 MG tablet Take 1 tablet (100 mg total) by mouth daily. 11/11/19  Yes Nafziger, Tommi Rumps, NP  omeprazole (PRILOSEC OTC) 20 MG tablet Take 2 tablets (40 mg total) by mouth daily. 02/27/13  Yes Viyuoh, Adeline C, MD  sertraline (ZOLOFT) 100 MG tablet TAKE 1 TABLET BY MOUTH EVERY DAY 02/25/20  Yes Nafziger, Tommi Rumps, NP  vitamin B-12 (CYANOCOBALAMIN) 1000 MCG tablet Take 1,000 mcg by mouth daily.   Yes [provider]     Positive ROS: Otherwise negative  All other systems have been reviewed and were otherwise negative with the exception of those mentioned in the HPI and as above.  Physical Exam: Constitutional: Alert, well-appearing, no acute distress.  He has no significant hoarseness. Ears: External ears without lesions or tenderness. Ear canals are clear bilaterally with intact, clear TMs.  Nasal: External nose without lesions. Septum slightly deviated to the left with mild rhinitis. Clear nasal passages otherwise. Oral: Lips and gums without lesions. Tongue and palate mucosa without lesions. Posterior oropharynx clear.  Indirect  laryngoscopy was difficult because of strong gag reflex. Fiberoptic laryngoscopy was performed to the right nostril.  The nasopharynx was clear.  The base of tongue vallecula epiglottis were normal.  Vocal cords were clear bilaterally with normal vocal cord mobility.  He had mild edema but no definitive lesions noted. Neck: No palpable adenopathy or masses.  No definitive palpable adenopathy noted on either side of the neck. Respiratory: Breathing comfortably  Skin: No facial/neck lesions or rash noted.  Laryngoscopy  Date/Time: 05/26/2020 8:44 AM Performed by: Rozetta Nunnery, MD Authorized by: Rozetta Nunnery, MD   Consent:    Consent obtained:  Verbal   Consent given by:  Patient Procedure details:    Indications: oncologic surveillance follow-up     Medication:  Afrin   Instrument: flexible fiberoptic laryngoscope     Scope location: right nare   Sinus:    Right nasopharynx: normal   Mouth:    Oropharynx: normal     Vallecula: normal     Base of tongue: normal     Epiglottis: normal   Throat:    True vocal cords: normal   Comments:     Vocal cords were clear bilaterally with mild vocal cord edema but no mucosal lesions noted.  Vocal cords had symmetric mobility.    Assessment: History of vocal cord cancer involving both vocal cords and anterior commissure status post completion of radiation therapy 10 months ago. No evidence of persistent or recurrent disease.  Plan: He will follow-up for recheck in 7 to 8 months.  As he is scheduled for follow-up with Dr. Isidore Moos 6 months following previous visit in August.   Radene Journey, MD

## 2020-06-03 DIAGNOSIS — L570 Actinic keratosis: Secondary | ICD-10-CM | POA: Diagnosis not present

## 2020-07-08 DIAGNOSIS — C140 Malignant neoplasm of pharynx, unspecified: Secondary | ICD-10-CM | POA: Diagnosis not present

## 2020-07-08 DIAGNOSIS — M1A09X0 Idiopathic chronic gout, multiple sites, without tophus (tophi): Secondary | ICD-10-CM | POA: Diagnosis not present

## 2020-07-08 DIAGNOSIS — Z6824 Body mass index (BMI) 24.0-24.9, adult: Secondary | ICD-10-CM | POA: Diagnosis not present

## 2020-07-08 DIAGNOSIS — M150 Primary generalized (osteo)arthritis: Secondary | ICD-10-CM | POA: Diagnosis not present

## 2020-07-08 DIAGNOSIS — M5030 Other cervical disc degeneration, unspecified cervical region: Secondary | ICD-10-CM | POA: Diagnosis not present

## 2020-10-01 ENCOUNTER — Other Ambulatory Visit: Payer: Self-pay

## 2020-10-01 ENCOUNTER — Ambulatory Visit
Admission: RE | Admit: 2020-10-01 | Discharge: 2020-10-01 | Disposition: A | Discharge status: Home / Self Care | Payer: No Typology Code available for payment source | Source: Ambulatory Visit | Attending: Radiation Oncology | Admitting: Radiation Oncology

## 2020-10-01 VITALS — BP 144/85 | HR 79 | Temp 97.7°F | Resp 20 | Wt 173.5 lb

## 2020-10-01 DIAGNOSIS — Z08 Encounter for follow-up examination after completed treatment for malignant neoplasm: Secondary | ICD-10-CM | POA: Diagnosis not present

## 2020-10-01 DIAGNOSIS — C320 Malignant neoplasm of glottis: Secondary | ICD-10-CM | POA: Insufficient documentation

## 2020-10-01 DIAGNOSIS — Z8521 Personal history of malignant neoplasm of larynx: Secondary | ICD-10-CM | POA: Diagnosis not present

## 2020-10-01 DIAGNOSIS — Z923 Personal history of irradiation: Secondary | ICD-10-CM | POA: Insufficient documentation

## 2020-10-01 MED ORDER — OXYMETAZOLINE HCL 0.05 % NA SOLN
1.0000 | Freq: Two times a day (BID) | NASAL | Status: DC
  Administered 2020-10-01: 1 via NASAL
  Filled 2020-10-01: qty 30

## 2020-10-01 NOTE — Progress Notes (Signed)
Mr. Irving presents today for follow-up of radiation to his larynx completed on 07/17/2019  Pain issues, if any: Patient denies Using a feeding tube?: N/A Weight changes, if any:  Wt Readings from Last 3 Encounters:  10/01/20 173 lb 8 oz (78.7 kg)  04/26/20 168 lb (76.2 kg)  04/12/20 168 lb (76.2 kg)   Swallowing issues, if any: Patient denies. Reports he can eat a wide variety of foods and beverages. Reports a healthy appetite and that he's been able to put on some weight Smoking or chewing tobacco? None Using fluoride trays daily? N/A--did have a couple teeth extractions and a root canal at the end of February Last ENT visit was on: 05/26/2020 Saw Dr. [name]: "He has been doing well does have intermittent hoarseness.  His voice sounds reasonably good today.  He is having no throat pain or discomfort. Patient has presently stopped smoking. --Flexible fiberoptic laryngoscope:  Comments:     Vocal cords were clear bilaterally with mild vocal cord edema but no mucosal lesions noted.  Vocal cords had symmetric mobility. --Plan: He will follow-up for recheck in 7 to 8 months.  As he is scheduled for follow-up with Dr. Isidore Moos 6 months following previous visit in August."  Other notable issues, if any: Continues to deal with vocal hoarseness, dry mouth, and low saliva production. Denies any ear or jaw pain, or difficulty opening his mouth fully. Denies any symptoms of lymphedema. Denies any issues with falling asleep or staying asleep. Overall reports he's doing well and feels good  Vitals:   10/01/20 1133  BP: (!) 144/85  Pulse: 79  Resp: 20  Temp: 97.7 F (36.5 C)  SpO2: 99%

## 2020-10-06 DIAGNOSIS — C140 Malignant neoplasm of pharynx, unspecified: Secondary | ICD-10-CM | POA: Diagnosis not present

## 2020-10-06 DIAGNOSIS — M150 Primary generalized (osteo)arthritis: Secondary | ICD-10-CM | POA: Diagnosis not present

## 2020-10-06 DIAGNOSIS — M1A09X0 Idiopathic chronic gout, multiple sites, without tophus (tophi): Secondary | ICD-10-CM | POA: Diagnosis not present

## 2020-10-06 DIAGNOSIS — Z6825 Body mass index (BMI) 25.0-25.9, adult: Secondary | ICD-10-CM | POA: Diagnosis not present

## 2020-10-06 DIAGNOSIS — E663 Overweight: Secondary | ICD-10-CM | POA: Diagnosis not present

## 2020-10-06 DIAGNOSIS — M5030 Other cervical disc degeneration, unspecified cervical region: Secondary | ICD-10-CM | POA: Diagnosis not present

## 2020-10-08 ENCOUNTER — Encounter: Payer: Self-pay | Admitting: Radiation Oncology

## 2020-10-08 NOTE — Progress Notes (Signed)
Radiation Oncology         [phone] ________________________________  Name: Aaron Black MRN: [id number]  Date: 10/01/2020  DOB: [birth year]  Follow-Up Visit Note - Outpatient  CC: Dorothyann Peng, NP  Rozetta Nunnery, *  Diagnosis and Prior Radiotherapy:       ICD-10-CM   1. Glottis carcinoma (HCC)  C32.0 Fiberoptic laryngoscopy    DISCONTINUED: oxymetazoline (AFRIN) 0.05 % nasal spray 1 spray   Cancer Staging Glottis carcinoma (HCC) Staging form: Larynx - Glottis, AJCC 8th Edition - Clinical stage from 06/03/2019: Stage I (cT1b, cN0, cM0) - Signed by Eppie Gibson, MD on 06/03/2019 Stage prefix: Initial diagnosis   Radiation Treatment Dates: 06/09/2019 through 07/17/2019  Site Technique Total Dose (Gy) Dose per Fx (Gy) Completed Fx Beam Energies  Larynx: HN_larynx 3D 63/63 2.25 28/28 6X   CHIEF COMPLAINT:  Here for follow-up and surveillance of throat cancer  Narrative:     Aaron Black presents today for follow-up of radiation to his larynx completed on 07/17/2019  Pain issues, if any: Patient denies Using a feeding tube?: N/A Weight changes, if any:  Wt Readings from Last 3 Encounters:  10/01/20 173 lb 8 oz (78.7 kg)  04/26/20 168 lb (76.2 kg)  04/12/20 168 lb (76.2 kg)   Swallowing issues, if any: Patient denies. Reports he can eat a wide variety of foods and beverages. Reports a healthy appetite and that he's been able to put on some weight Smoking or chewing tobacco? None Using fluoride trays daily? N/A--did have a couple teeth extractions and a root canal at the end of February Last ENT visit was on: 05/26/2020 Saw Dr. [name]: "He has been doing well does have intermittent hoarseness.  His voice sounds reasonably good today.  He is having no throat pain or discomfort. Patient has presently stopped smoking. --Flexible fiberoptic laryngoscope:  Comments:     Vocal cords were clear bilaterally with mild vocal cord edema but no mucosal lesions  noted.  Vocal cords had symmetric mobility. --Plan: He will follow-up for recheck in 7 to 8 months.  As he is scheduled for follow-up with Dr. Isidore Moos 6 months following previous visit in August."  Other notable issues, if any: Continues to deal with vocal hoarseness, dry mouth, and low saliva production. Denies any ear or jaw pain, or difficulty opening his mouth fully. Denies any symptoms of lymphedema. Denies any issues with falling asleep or staying asleep. Overall reports he's doing well and feels good  Vitals:   10/01/20 1133  BP: (!) 144/85  Pulse: 79  Resp: 20  Temp: 97.7 F (36.5 C)  SpO2: 99%          ALLERGIES:  is allergic to lisinopril and percodan [oxycodone-aspirin].  Meds: Current Outpatient Medications  Medication Sig Dispense Refill  . albuterol (VENTOLIN HFA) 108 (90 Base) MCG/ACT inhaler Inhale 2 puffs into the lungs every 6 (six) hours as needed for wheezing or shortness of breath. 8 g 0  . allopurinol (ZYLOPRIM) 100 MG tablet TAKE 1 TABLET BY MOUTH EVERY DAY 90 tablet 1  . atorvastatin (LIPITOR) 80 MG tablet Take 1 tablet (80 mg total) by mouth daily. 90 tablet 3  . colchicine 0.6 MG tablet TAKE 1 TABLET (0.6 MG TOTAL) BY MOUTH DAILY AS NEEDED (GOUT). 90 tablet 1  . cyclobenzaprine (FLEXERIL) 10 MG tablet Take 10 mg by mouth daily.    . diclofenac sodium (VOLTAREN) 1 % GEL Apply 2 g topically as needed (for pain).  0  . diltiazem (CARDIZEM CD) 360 MG 24 hr capsule TAKE 1 CAPSULE BY MOUTH EVERY DAY 90 capsule 3  . HYDROcodone-acetaminophen (NORCO/VICODIN) 5-325 MG tablet Take 1 tablet by mouth every 8 (eight) hours as needed for moderate pain.   0  . lidocaine (XYLOCAINE) 2 % solution Patient: Mix 1part 2% viscous lidocaine, 1part H20. Swish & swallow 75mL of diluted mixture, 75min before meals and at bedtime, up to QID 300 mL 3  . losartan (COZAAR) 100 MG tablet Take 1 tablet (100 mg total) by mouth daily. 90 tablet 3  . omeprazole (PRILOSEC OTC) 20 MG tablet  Take 2 tablets (40 mg total) by mouth daily.    . sertraline (ZOLOFT) 100 MG tablet TAKE 1 TABLET BY MOUTH EVERY DAY 90 tablet 1  . vitamin B-12 (CYANOCOBALAMIN) 1000 MCG tablet Take 1,000 mcg by mouth daily.     No current facility-administered medications for this encounter.    Physical Findings: The patient is in no acute distress. Patient is alert and oriented. Wt Readings from Last 3 Encounters:  10/01/20 173 lb 8 oz (78.7 kg)  04/26/20 168 lb (76.2 kg)  04/12/20 168 lb (76.2 kg)    weight is 173 lb 8 oz (78.7 kg). His oral temperature is 97.7 F (36.5 C). His blood pressure is 144/85 (abnormal) and his pulse is 79. His respiration is 20 and oxygen saturation is 99%. .  General: Alert and oriented, in no acute distress HEENT: Head is normocephalic. Extraocular movements are intact. Oropharynx is clear. Neck: Neck is supple, no palpable cervical or supraclavicular lymphadenopathy. Extremities: No cyanosis or edema. Lymphatics: see Neck Exam Neurologic: Cranial nerves II through XII are grossly intact. No obvious focalities. Speech is fluent. Coordination is intact. Psychiatric: Judgment and insight are intact. Affect is appropriate.   PROCEDURE NOTE: After obtaining consent and anesthetizing the nasal cavity with topical decongestant, the flexible endoscope was introduced and passed through the nasal cavity.  No lesions seen in the pharynx or larynx.  The cords are symmetrically mobile without any nodules.  He tolerated the procedure well.  Lab Findings: Lab Results  Component Value Date   WBC 6.0 11/11/2019   HGB 13.2 11/11/2019   HCT 39.7 11/11/2019   MCV 90.4 11/11/2019   PLT 213.0 11/11/2019    Lab Results  Component Value Date   TSH 0.86 11/11/2019    Radiographic Findings: No results found.  Impression/Plan:    1) Head and Neck Cancer Status: NED  2) Nutritional Status: denies issues PEG tube: none  3) Risk Factors: The patient has been educated about  risk factors including tobacco abuse; they understand that avoidance of smoking/tobacco is important to prevent recurrences as well as other cancers.      4) Swallowing: good function  5)  Thyroid function: Most recent lab test is WNL, continue this lab annually w/ PCP as RT can increase risk of hypothyroidism. Lab Results  Component Value Date   TSH 0.86 11/11/2019    6) Other: followup with Dr Lucia Gaskins for surveillance as scheduled. Followup in rad/onc in 6 months. The patient was encouraged to call with any issues or questions before then.    On date of service, in total, I spent 25 minutes on this encounter. Patient was seen in person.  _____________________________________   Eppie Gibson, MD

## 2020-10-12 DIAGNOSIS — M25552 Pain in left hip: Secondary | ICD-10-CM | POA: Diagnosis not present

## 2020-10-12 DIAGNOSIS — M5416 Radiculopathy, lumbar region: Secondary | ICD-10-CM | POA: Diagnosis not present

## 2020-10-13 DIAGNOSIS — L821 Other seborrheic keratosis: Secondary | ICD-10-CM | POA: Diagnosis not present

## 2020-10-13 DIAGNOSIS — D229 Melanocytic nevi, unspecified: Secondary | ICD-10-CM | POA: Diagnosis not present

## 2020-10-13 DIAGNOSIS — L905 Scar conditions and fibrosis of skin: Secondary | ICD-10-CM | POA: Diagnosis not present

## 2020-10-13 DIAGNOSIS — L814 Other melanin hyperpigmentation: Secondary | ICD-10-CM | POA: Diagnosis not present

## 2020-10-13 DIAGNOSIS — L570 Actinic keratosis: Secondary | ICD-10-CM | POA: Diagnosis not present

## 2020-10-13 DIAGNOSIS — L819 Disorder of pigmentation, unspecified: Secondary | ICD-10-CM | POA: Diagnosis not present

## 2020-11-01 DIAGNOSIS — Z6827 Body mass index (BMI) 27.0-27.9, adult: Secondary | ICD-10-CM | POA: Diagnosis not present

## 2020-11-01 DIAGNOSIS — Z981 Arthrodesis status: Secondary | ICD-10-CM | POA: Diagnosis not present

## 2020-11-01 DIAGNOSIS — M47816 Spondylosis without myelopathy or radiculopathy, lumbar region: Secondary | ICD-10-CM | POA: Diagnosis not present

## 2020-11-01 DIAGNOSIS — M5416 Radiculopathy, lumbar region: Secondary | ICD-10-CM | POA: Diagnosis not present

## 2020-11-01 DIAGNOSIS — M4120 Other idiopathic scoliosis, site unspecified: Secondary | ICD-10-CM | POA: Diagnosis not present

## 2020-11-01 DIAGNOSIS — G8929 Other chronic pain: Secondary | ICD-10-CM | POA: Diagnosis not present

## 2020-11-01 DIAGNOSIS — M5442 Lumbago with sciatica, left side: Secondary | ICD-10-CM | POA: Diagnosis not present

## 2020-11-01 DIAGNOSIS — I10 Essential (primary) hypertension: Secondary | ICD-10-CM | POA: Diagnosis not present

## 2020-11-02 ENCOUNTER — Other Ambulatory Visit: Payer: Self-pay | Admitting: Adult Health

## 2020-11-02 ENCOUNTER — Other Ambulatory Visit: Payer: Self-pay

## 2020-11-02 ENCOUNTER — Ambulatory Visit (INDEPENDENT_AMBULATORY_CARE_PROVIDER_SITE_OTHER)
Admission: RE | Admit: 2020-11-02 | Discharge: 2020-11-02 | Disposition: A | Discharge status: Home / Self Care | Payer: No Typology Code available for payment source | Source: Ambulatory Visit | Attending: Acute Care | Admitting: Acute Care

## 2020-11-02 DIAGNOSIS — Z87891 Personal history of nicotine dependence: Secondary | ICD-10-CM | POA: Diagnosis not present

## 2020-11-02 DIAGNOSIS — Z981 Arthrodesis status: Secondary | ICD-10-CM | POA: Insufficient documentation

## 2020-11-02 DIAGNOSIS — G8929 Other chronic pain: Secondary | ICD-10-CM | POA: Insufficient documentation

## 2020-11-05 ENCOUNTER — Ambulatory Visit: Payer: No Typology Code available for payment source

## 2020-11-11 ENCOUNTER — Other Ambulatory Visit: Payer: Self-pay

## 2020-11-11 ENCOUNTER — Emergency Department (HOSPITAL_COMMUNITY): Payer: No Typology Code available for payment source

## 2020-11-11 ENCOUNTER — Encounter (HOSPITAL_COMMUNITY): Payer: Self-pay | Admitting: *Deleted

## 2020-11-11 ENCOUNTER — Emergency Department (HOSPITAL_COMMUNITY)
Admission: EM | Admit: 2020-11-11 | Discharge: 2020-11-11 | Disposition: A | Discharge status: Home / Self Care | Payer: No Typology Code available for payment source | Attending: Emergency Medicine | Admitting: Emergency Medicine

## 2020-11-11 DIAGNOSIS — Z87891 Personal history of nicotine dependence: Secondary | ICD-10-CM | POA: Diagnosis not present

## 2020-11-11 DIAGNOSIS — M545 Low back pain, unspecified: Secondary | ICD-10-CM | POA: Diagnosis not present

## 2020-11-11 DIAGNOSIS — Z96651 Presence of right artificial knee joint: Secondary | ICD-10-CM | POA: Insufficient documentation

## 2020-11-11 DIAGNOSIS — M25552 Pain in left hip: Secondary | ICD-10-CM | POA: Insufficient documentation

## 2020-11-11 DIAGNOSIS — R109 Unspecified abdominal pain: Secondary | ICD-10-CM | POA: Diagnosis not present

## 2020-11-11 DIAGNOSIS — I10 Essential (primary) hypertension: Secondary | ICD-10-CM | POA: Diagnosis not present

## 2020-11-11 DIAGNOSIS — Z79899 Other long term (current) drug therapy: Secondary | ICD-10-CM | POA: Diagnosis not present

## 2020-11-11 DIAGNOSIS — Z85818 Personal history of malignant neoplasm of other sites of lip, oral cavity, and pharynx: Secondary | ICD-10-CM | POA: Diagnosis not present

## 2020-11-11 LAB — I-STAT CHEM 8, ED
BUN: 14 mg/dL (ref 8–23)
Calcium, Ion: 1.12 mmol/L — ABNORMAL LOW (ref 1.15–1.40)
Chloride: 102 mmol/L (ref 98–111)
Creatinine, Ser: 0.9 mg/dL (ref 0.61–1.24)
Glucose, Bld: 99 mg/dL (ref 70–99)
HCT: 37 % — ABNORMAL LOW (ref 39.0–52.0)
Hemoglobin: 12.6 g/dL — ABNORMAL LOW (ref 13.0–17.0)
Potassium: 4 mmol/L (ref 3.5–5.1)
Sodium: 134 mmol/L — ABNORMAL LOW (ref 135–145)
TCO2: 24 mmol/L (ref 22–32)

## 2020-11-11 MED ORDER — HYDROMORPHONE HCL 1 MG/ML IJ SOLN
1.0000 mg | Freq: Once | INTRAMUSCULAR | Status: AC
  Administered 2020-11-11: 1 mg via INTRAVENOUS
  Filled 2020-11-11: qty 1

## 2020-11-11 MED ORDER — PREDNISONE 10 MG PO TABS: 20.0000 mg | ORAL_TABLET | Freq: Every day | ORAL | 0 refills | Status: DC

## 2020-11-11 MED ORDER — PREDNISONE 20 MG PO TABS
40.0000 mg | ORAL_TABLET | Freq: Once | ORAL | Status: AC
  Administered 2020-11-11: 40 mg via ORAL
  Filled 2020-11-11: qty 2

## 2020-11-11 MED ORDER — HYDROMORPHONE HCL 2 MG PO TABS
4.0000 mg | ORAL_TABLET | ORAL | Status: AC
  Administered 2020-11-11: 4 mg via ORAL
  Filled 2020-11-11: qty 2

## 2020-11-11 MED ORDER — GADOBUTROL 1 MMOL/ML IV SOLN
7.5000 mL | Freq: Once | INTRAVENOUS | Status: AC | PRN
  Administered 2020-11-11: 7.5 mL via INTRAVENOUS

## 2020-11-11 NOTE — ED Notes (Signed)
Pt discharged with wife at this time

## 2020-11-11 NOTE — ED Triage Notes (Signed)
Pt presents with left leg pain since a fall in Jan. Pt scheduled to have outpatient MRI on 04/27, but is in too much pain to wait for his appointment. Pt states he thinks it is a pinched nerve.

## 2020-11-11 NOTE — ED Notes (Signed)
IV dilaudid given to pt. Pt informed he would need to stay for monitoring.

## 2020-11-11 NOTE — Discharge Instructions (Addendum)
Your pain appears to be from your low back area. You are given a dose of Dilaudid here and started on prednisone. You will be placed on prednisone for the next 5 days.  This is to help with any inflammation or swelling that is causing your pain from the low back area. Please return to the emergency department if you are having worsening pain, weakness, loss of bowel or bladder control.

## 2020-11-11 NOTE — ED Provider Notes (Signed)
[name] EMERGENCY DEPARTMENT Provider Note   CSN: [id number] Arrival date & time: 11/11/20  9030     History Chief Complaint  Patient presents with  . Leg Pain    Aaron Black is a 65 y.o. male.  HPI 65 year old male presents today complaining of left hip and groin pain.  This occurred after a fall in January.  Aaron Black has been seen by his orthopedist and sent to neurosurgery.  Aaron Black has an MRI of his spine coming up before the end of the month.  Aaron Black continues to have pain that radiates into the left hip and groin.  The pain was not controlled by home Vicodin that Aaron Black took today.  Aaron Black describes it as 10 out of 10.  It is worse with walking.  Aaron Black denies numbness, tingling, weakness, loss of bowel or bladder control or perianal/perineal sensation.  Aaron Black is using a cane to ambulate.  Aaron Black is on Flexeril at home for pain.  Aaron Black is not taking any regular scheduled acetaminophen or ibuprofen.  Aaron Black denies history of GI bleeding or kidney issues.    Past Medical History:  Diagnosis Date  . Anemia   . Anxiety   . Arthritis   . B12 deficiency   . Cancer (HCC)    throat cancer   . Cellulitis of arm, left 10/2017   after cat scratch  . Cervical disc disease    a. 07/2003 ant cervical deomcpression and fusion C5-6/C6-7;  b. 09/2007 post cervical laminectomy C4-5 with scres and arthrodesis C4-C7.  Marland Kitchen Chest pain at rest 02/25/2013  . Chronic lower back pain   . Complication of anesthesia    " i WOKE UP DURING A COLONOSCOPY "- 2008  . Depression   . Diverticulosis of colon   . GERD (gastroesophageal reflux disease)   . Gout   . Hyperlipidemia   . Hypertension   . Pleurisy   . Shortness of breath dyspnea    with exertion- "I cant exercise now"  . Tobacco abuse    a. 40 yrs, 1.5-3 ppd over that time.    Patient Active Problem List   Diagnosis Date Noted  . Glottis carcinoma ([place]) 06/03/2019  . Encounter for tobacco use cessation counseling 11/01/2017  . Pseudoarthrosis of lumbar  spine 01/21/2016  . Lumbar spine scoliosis 06/05/2014  . Herniated lumbar disc without myelopathy 07/11/2013  . Tobacco abuse 09/25/2011  . Gout 05/27/2009  . VITAMIN B12 DEFICIENCY 09/08/2008  . HLD (hyperlipidemia) 09/08/2008  . COLONIC POLYPS, HX OF 09/08/2008  . Anemia, unspecified 06/30/2008  . Anxiety disorder 06/29/2008  . Essential hypertension 06/29/2008  . GERD 06/29/2008  . Diverticulosis of colon 06/29/2008  . ARTHRITIS 06/29/2008  . HERNIATED DISC 06/29/2008    Past Surgical History:  Procedure Laterality Date  . ANTERIOR LATERAL LUMBAR FUSION 4 LEVELS Right 06/05/2014   Procedure: ANTERIOR LATERAL LUMBAR FUSION  LUMBAR ONE TO LUMBAR FIVE with Percutaneous Pedicle Screws;  Surgeon: Erline Levine, MD;  Location: [place] [facility];  Service: Neurosurgery;  Laterality: Right;  . CARPAL TUNNEL RELEASE Right   . CERVICAL DISC SURGERY     X 2  . COLONOSCOPY    . ELBOW ARTHROSCOPY Right   . KNEE ARTHROSCOPY Right 08/2013   torn menicus  . KNEE ARTHROSCOPY Left 11/2013   chip cartlidge,torn menicus  . LUMBAR LAMINECTOMY/DECOMPRESSION MICRODISCECTOMY Right 07/11/2013   Procedure: LUMBAR LAMINECTOMY/DECOMPRESSION MICRODISCECTOMY 1 LEVEL;  Surgeon: Erline Levine, MD;  Location: [place] [facility];  Service: Neurosurgery;  Laterality: Right;  Right L34 microdiskectomy  . LUMBAR PERCUTANEOUS PEDICLE SCREW 4 LEVEL N/A 06/05/2014   Procedure: LUMBAR PERCUTANEOUS PEDICLE SCREW 4 LEVEL;  Surgeon: Erline Levine, MD;  Location: [place] [facility];  Service: Neurosurgery;  Laterality: N/A;  . MICROLARYNGOSCOPY Left 05/12/2019   Procedure: MICROLARYNGOSCOPY WITH BIOPSY;  Surgeon: Rozetta Nunnery, MD;  Location: [place];  Service: ENT;  Laterality: Left;  . NECK SURGERY  January 2005, March 2009   C-Spine  . POLYPECTOMY    . ROTATOR CUFF REPAIR  August 2000, January 2002, July 2008, July 2009   2 left 2 right       Family History  Problem Relation Age of Onset  . Bone cancer  Mother   . Squamous cell carcinoma Mother        died @ 6  . Heart attack Mother   . Hypertension Father   . Diabetes Father        borderline  . Congestive Heart Failure Father        alive @ 48  . Heart failure Father   . Sudden death Brother        died @ 7  . Other Brother        died in [place] @ 6 - struck by drunk driver  . Other Brother        died in M[place] @ 78 - struck by drunk driver  . Heart attack Brother   . Stroke Paternal Grandfather   . Colon cancer Neg Hx   . Esophageal cancer Neg Hx   . Rectal cancer Neg Hx   . Stomach cancer Neg Hx   . Colon polyps Neg Hx     Social History   Tobacco Use  . Smoking status: Former Smoker    Packs/day: 1.00    Years: 42.00    Pack years: 42.00    Types: Cigarettes    Start date: 1979    Quit date: 06/08/2019    Years since quitting: 1.4  . Smokeless tobacco: Never Used  . Tobacco comment: Aaron Black is smoking about 4-5 cigarettes, Aaron Black had smoked up to 3 packs daily. 06/03/19  Vaping Use  . Vaping Use: Never used  Substance Use Topics  . Alcohol use: Yes    Comment: occasional  . Drug use: No    Home Medications Prior to Admission medications   Medication Sig Start Date End Date Taking? Authorizing Provider  albuterol (VENTOLIN HFA) 108 (90 Base) MCG/ACT inhaler Inhale 2 puffs into the lungs every 6 (six) hours as needed for wheezing or shortness of breath. 04/12/19   Zigmund Gottron, NP  allopurinol (ZYLOPRIM) 100 MG tablet TAKE 1 TABLET BY MOUTH EVERY DAY 04/21/20   Nafziger, Tommi Rumps, NP  atorvastatin (LIPITOR) 80 MG tablet Take 1 tablet (80 mg total) by mouth daily. Appointment needed for future refills* 11/02/20   Nafziger, Tommi Rumps, NP  colchicine 0.6 MG tablet TAKE 1 TABLET (0.6 MG TOTAL) BY MOUTH DAILY AS NEEDED (GOUT). 03/20/18   Marletta Lor, MD  cyclobenzaprine (FLEXERIL) 10 MG tablet Take 10 mg by mouth daily. 12/27/18   [provider]  diclofenac sodium (VOLTAREN) 1 % GEL Apply 2 g topically as needed (for  pain).  04/01/17   [provider]  diltiazem (CARDIZEM CD) 360 MG 24 hr capsule TAKE 1 CAPSULE BY MOUTH EVERY DAY 11/11/19   Nafziger, Tommi Rumps, NP  HYDROcodone-acetaminophen (NORCO/VICODIN) 5-325 MG tablet Take 1 tablet by mouth every 8 (  eight) hours as needed for moderate pain.  09/24/17   [provider]  lidocaine (XYLOCAINE) 2 % solution Patient: Mix 1part 2% viscous lidocaine, 1part H20. Swish & swallow 53mL of diluted mixture, 74min before meals and at bedtime, up to QID 07/07/19   Eppie Gibson, MD  losartan (COZAAR) 100 MG tablet Take 1 tablet (100 mg total) by mouth daily. 11/11/19   Nafziger, Tommi Rumps, NP  omeprazole (PRILOSEC OTC) 20 MG tablet Take 2 tablets (40 mg total) by mouth daily. 02/27/13   Viyuoh, Alison Stalling, MD  sertraline (ZOLOFT) 100 MG tablet TAKE 1 TABLET BY MOUTH EVERY DAY 02/25/20   Nafziger, Tommi Rumps, NP  vitamin B-12 (CYANOCOBALAMIN) 1000 MCG tablet Take 1,000 mcg by mouth daily.    [provider]    Allergies    Lisinopril and Percodan [oxycodone-aspirin]  Review of Systems   Review of Systems  All other systems reviewed and are negative.   Physical Exam Updated Vital Signs There were no vitals taken for this visit.  Physical Exam Vitals and nursing note reviewed.  Constitutional:      General: Aaron Black is not in acute distress.    Appearance: Normal appearance. Aaron Black is obese. Aaron Black is not ill-appearing.  HENT:     Head: Normocephalic and atraumatic.     Right Ear: External ear normal.     Nose: Nose normal.     Mouth/Throat:     Pharynx: Oropharynx is clear.  Eyes:     Extraocular Movements: Extraocular movements intact.     Pupils: Pupils are equal, round, and reactive to light.  Cardiovascular:     Rate and Rhythm: Normal rate and regular rhythm.     Pulses: Normal pulses.     Heart sounds: Normal heart sounds.     Comments: Pulses at left groin and left foot are normal Pulmonary:     Effort: Pulmonary effort is normal.  Abdominal:     General:  Abdomen is flat.  Musculoskeletal:        General: Normal range of motion.     Cervical back: Normal range of motion.     Comments: No palpable tenderness over hip, pelvis, or groin No obvious external signs of trauma No tenderness palpation over lumbar spine although old scar was visualized Full active range of motion of bilateral lower extremities including hip knee and ankle.  Skin:    General: Skin is warm and dry.     Capillary Refill: Capillary refill takes less than 2 seconds.  Neurological:     General: No focal deficit present.     Mental Status: Aaron Black is alert.  Psychiatric:        Mood and Affect: Mood normal.        Behavior: Behavior normal.     ED Results / Procedures / Treatments   Labs (all labs ordered are listed, but only abnormal results are displayed) Labs Reviewed - No data to display  EKG None  Radiology MR Lumbar Spine W Wo Contrast  Result Date: 11/11/2020 CLINICAL DATA:  Left leg pain EXAM: MRI LUMBAR SPINE WITHOUT AND WITH CONTRAST TECHNIQUE: Multiplanar and multiecho pulse sequences of the lumbar spine were obtained without and with intravenous contrast. CONTRAST:  7.66mL GADAVIST GADOBUTROL 1 MMOL/ML IV SOLN COMPARISON:  X-Tashe Purdon 11/01/2020, MRI 12/29/2015 FINDINGS: Segmentation:  Standard. Alignment:  Physiologic. Vertebrae: Prior posterior and interbody fusion extending from L1 to L5. Susceptibility artifact related to hardware slightly degrades evaluation of the adjacent structures. Discogenic endplate marrow changes  notable at L5-S1 and T11-12. No evidence of fracture, discitis, or suspicious bone lesion. Conus medullaris and cauda equina: Conus extends to the L1 level. Conus and cauda equina appear normal. Paraspinal and other soft tissues: Negative. Disc levels: T12-L1: Mild broad-based disc bulge resulting in slight impress upon the ventral thecal sac without canal stenosis. No foraminal stenosis. No significant interval progression from prior. L1-L2: Prior  fusion. No canal stenosis. Mild left greater than right bilateral foraminal stenosis. No significant interval change from prior. L2-L3: Prior fusion. No evidence of foraminal or canal stenosis. Unchanged. L3-L4: Prior fusion. No evidence of foraminal or canal stenosis. Unchanged. L4-L5: Prior fusion. Redemonstrated leftward positioning of the cage lateral to the left neural foramen (series 7, image 30; series 3, images 13-15). This results in at least moderate left foraminal/extraforaminal stenosis. Mild right foraminal stenosis. No canal stenosis. Findings are similar to the previous study. L5-S1: No significant disc protrusion. No foraminal or canal stenosis. Unchanged. IMPRESSION: 1. Prior posterior and interbody fusion extending from L1 to L5. 2. Redemonstrated leftward positioning of the cage lateral to the left neural foramen at L4-5 resulting in at least moderate left foraminal/extraforaminal stenosis. This is similar in appearance to the prior study. 3. No significant canal stenosis at any level. Electronically Signed   By: Davina Poke D.O.   On: 11/11/2020 12:56    Procedures Procedures   Medications Ordered in ED Medications - No data to display  ED Course  I have reviewed the triage vital signs and the nursing notes.  Pertinent labs & imaging results that were available during my care of the patient were reviewed by me and considered in my medical decision making (see chart for details).    MDM Rules/Calculators/A&P                          Patient with left hip and groin pain since fall in January.  Patient has had evaluation by Ortho and neurosurgery.  Aaron Black is pending an MRI to evaluate his low back.  It appears that the most likely etiology is sciatica from his back.  The hip and the pelvis are normal on my evaluation. Patient is taking narcotic pain medicine at home.  Plan burst of prednisone which likely will help with inflammation of the nerve root. Patient advised regarding  return precautions and follow-up Final Clinical Impression(s) / ED Diagnoses Final diagnoses:  Left hip pain    Rx / DC Orders ED Discharge Orders    None       Pattricia Boss, MD 11/11/20 1346

## 2020-11-11 NOTE — ED Notes (Signed)
Called MRI requesting update in regards to pending MRI. Per MRI tech pt on the list. Pt likely to be scanned around 1-1:30 pm. Pt and pt's wife updated

## 2020-11-11 NOTE — ED Notes (Signed)
Discharge instructions discussed with pt and wife at bedside. Pt verbalized understanding with no questions at this time. Pt pending discharge

## 2020-11-16 ENCOUNTER — Other Ambulatory Visit: Payer: Self-pay | Admitting: *Deleted

## 2020-11-16 DIAGNOSIS — Z87891 Personal history of nicotine dependence: Secondary | ICD-10-CM

## 2020-11-16 NOTE — Progress Notes (Signed)
Please call patient and let them  know their  low dose Ct was read as a Lung RADS 2: nodules that are benign in appearance and behavior with a very low likelihood of becoming a clinically active cancer due to size or lack of growth. Recommendation per radiology is for a repeat LDCT in 12 months. .Please let them  know we will order and schedule their  annual screening scan for 10/2021. Please let them  know there was notation of CAD on their  scan.  Please remind the patient  that this is a non-gated exam therefore degree or severity of disease  cannot be determined. Please have them  follow up with their PCP regarding potential risk factor modification, dietary therapy or pharmacologic therapy if clinically indicated. Pt.  is  currently on statin therapy. Please place order for annual  screening scan for  10/2021 and fax results to PCP. Thanks so much.  Please have patient follow up with PCP re: CAD and hepatic steatosis. Thanks so much

## 2020-11-17 ENCOUNTER — Other Ambulatory Visit: Payer: Self-pay

## 2020-11-17 ENCOUNTER — Other Ambulatory Visit: Payer: Self-pay | Admitting: Adult Health

## 2020-11-17 DIAGNOSIS — I10 Essential (primary) hypertension: Secondary | ICD-10-CM

## 2020-11-17 DIAGNOSIS — F419 Anxiety disorder, unspecified: Secondary | ICD-10-CM

## 2020-11-17 MED ORDER — ALLOPURINOL 100 MG PO TABS
100.0000 mg | ORAL_TABLET | Freq: Every day | ORAL | 0 refills | Status: DC
Start: 2020-11-17 — End: 2020-12-02

## 2020-11-17 MED ORDER — LOSARTAN POTASSIUM 100 MG PO TABS: 100.0000 mg | ORAL_TABLET | Freq: Every day | ORAL | 0 refills | Status: DC

## 2020-11-17 MED ORDER — DILTIAZEM HCL ER COATED BEADS 360 MG PO CP24: ORAL_CAPSULE | ORAL | 0 refills | Status: DC

## 2020-11-17 MED ORDER — SERTRALINE HCL 100 MG PO TABS: 1.0000 | ORAL_TABLET | Freq: Every day | ORAL | 0 refills | Status: DC

## 2020-11-26 DIAGNOSIS — G8929 Other chronic pain: Secondary | ICD-10-CM | POA: Diagnosis not present

## 2020-11-26 DIAGNOSIS — M5416 Radiculopathy, lumbar region: Secondary | ICD-10-CM | POA: Diagnosis not present

## 2020-11-26 DIAGNOSIS — M545 Low back pain, unspecified: Secondary | ICD-10-CM | POA: Diagnosis not present

## 2020-11-26 DIAGNOSIS — Z981 Arthrodesis status: Secondary | ICD-10-CM | POA: Diagnosis not present

## 2020-11-26 DIAGNOSIS — S32009K Unspecified fracture of unspecified lumbar vertebra, subsequent encounter for fracture with nonunion: Secondary | ICD-10-CM | POA: Diagnosis not present

## 2020-11-26 DIAGNOSIS — I10 Essential (primary) hypertension: Secondary | ICD-10-CM | POA: Diagnosis not present

## 2020-11-30 ENCOUNTER — Other Ambulatory Visit: Payer: Self-pay | Admitting: Neurosurgery

## 2020-11-30 DIAGNOSIS — S32009K Unspecified fracture of unspecified lumbar vertebra, subsequent encounter for fracture with nonunion: Secondary | ICD-10-CM

## 2020-12-01 ENCOUNTER — Other Ambulatory Visit: Payer: Self-pay

## 2020-12-02 ENCOUNTER — Ambulatory Visit (INDEPENDENT_AMBULATORY_CARE_PROVIDER_SITE_OTHER): Payer: No Typology Code available for payment source | Admitting: Adult Health

## 2020-12-02 ENCOUNTER — Encounter: Payer: Self-pay | Admitting: Adult Health

## 2020-12-02 VITALS — BP 160/90 | HR 92 | Temp 98.3°F | Ht 68.0 in | Wt 174.0 lb

## 2020-12-02 DIAGNOSIS — Z125 Encounter for screening for malignant neoplasm of prostate: Secondary | ICD-10-CM | POA: Diagnosis not present

## 2020-12-02 DIAGNOSIS — Z1159 Encounter for screening for other viral diseases: Secondary | ICD-10-CM | POA: Diagnosis not present

## 2020-12-02 DIAGNOSIS — E785 Hyperlipidemia, unspecified: Secondary | ICD-10-CM | POA: Diagnosis not present

## 2020-12-02 DIAGNOSIS — Z0001 Encounter for general adult medical examination with abnormal findings: Secondary | ICD-10-CM | POA: Diagnosis not present

## 2020-12-02 DIAGNOSIS — M1000 Idiopathic gout, unspecified site: Secondary | ICD-10-CM

## 2020-12-02 DIAGNOSIS — I10 Essential (primary) hypertension: Secondary | ICD-10-CM

## 2020-12-02 DIAGNOSIS — F419 Anxiety disorder, unspecified: Secondary | ICD-10-CM | POA: Diagnosis not present

## 2020-12-02 DIAGNOSIS — S32009K Unspecified fracture of unspecified lumbar vertebra, subsequent encounter for fracture with nonunion: Secondary | ICD-10-CM

## 2020-12-02 DIAGNOSIS — C320 Malignant neoplasm of glottis: Secondary | ICD-10-CM | POA: Diagnosis not present

## 2020-12-02 DIAGNOSIS — R69 Illness, unspecified: Secondary | ICD-10-CM | POA: Diagnosis not present

## 2020-12-02 LAB — CBC WITH DIFFERENTIAL/PLATELET
Basophils Absolute: 0 10*3/uL (ref 0.0–0.1)
Basophils Relative: 0.6 % (ref 0.0–3.0)
Eosinophils Absolute: 0.1 10*3/uL (ref 0.0–0.7)
Eosinophils Relative: 1.9 % (ref 0.0–5.0)
HCT: 39.1 % (ref 39.0–52.0)
Hemoglobin: 13.4 g/dL (ref 13.0–17.0)
Lymphocytes Relative: 17.3 % (ref 12.0–46.0)
Lymphs Abs: 1.1 10*3/uL (ref 0.7–4.0)
MCHC: 34.3 g/dL (ref 30.0–36.0)
MCV: 89.6 fl (ref 78.0–100.0)
Monocytes Absolute: 0.5 10*3/uL (ref 0.1–1.0)
Monocytes Relative: 8 % (ref 3.0–12.0)
Neutro Abs: 4.5 10*3/uL (ref 1.4–7.7)
Neutrophils Relative %: 72.2 % (ref 43.0–77.0)
Platelets: 245 10*3/uL (ref 150.0–400.0)
RBC: 4.36 Mil/uL (ref 4.22–5.81)
RDW: 13.8 % (ref 11.5–15.5)
WBC: 6.2 10*3/uL (ref 4.0–10.5)

## 2020-12-02 LAB — LIPID PANEL
Cholesterol: 192 mg/dL (ref 0–200)
HDL: 67.4 mg/dL (ref >39.00)
LDL Cholesterol: 106 mg/dL — ABNORMAL HIGH (ref 0–99)
NonHDL: 124.41
Total CHOL/HDL Ratio: 3
Triglycerides: 92 mg/dL (ref 0.0–149.0)
VLDL: 18.4 mg/dL (ref 0.0–40.0)

## 2020-12-02 LAB — COMPREHENSIVE METABOLIC PANEL
ALT: 21 U/L (ref 0–53)
AST: 23 U/L (ref 0–37)
Albumin: 4.8 g/dL (ref 3.5–5.2)
Alkaline Phosphatase: 98 U/L (ref 39–117)
BUN: 15 mg/dL (ref 6–23)
CO2: 27 mEq/L (ref 19–32)
Calcium: 10.2 mg/dL (ref 8.4–10.5)
Chloride: 98 mEq/L (ref 96–112)
Creatinine, Ser: 1.01 mg/dL (ref 0.40–1.50)
GFR: 79.24 mL/min (ref >60.00)
Glucose, Bld: 88 mg/dL (ref 70–99)
Potassium: 5.2 mEq/L — ABNORMAL HIGH (ref 3.5–5.1)
Sodium: 136 mEq/L (ref 135–145)
Total Bilirubin: 0.5 mg/dL (ref 0.2–1.2)
Total Protein: 7.5 g/dL (ref 6.0–8.3)

## 2020-12-02 LAB — PSA: PSA: 0.54 ng/mL (ref 0.10–4.00)

## 2020-12-02 LAB — TSH: TSH: 0.9 u[IU]/mL (ref 0.35–4.50)

## 2020-12-02 MED ORDER — LOSARTAN POTASSIUM 100 MG PO TABS: 100.0000 mg | ORAL_TABLET | Freq: Every day | ORAL | 3 refills | Status: DC

## 2020-12-02 MED ORDER — SERTRALINE HCL 100 MG PO TABS: 1.0000 | ORAL_TABLET | Freq: Every day | ORAL | 1 refills | Status: DC

## 2020-12-02 MED ORDER — ATORVASTATIN CALCIUM 80 MG PO TABS: 1.0000 | ORAL_TABLET | Freq: Every day | ORAL | 3 refills | Status: DC

## 2020-12-02 MED ORDER — DILTIAZEM HCL ER COATED BEADS 360 MG PO CP24
ORAL_CAPSULE | ORAL | 3 refills | Status: DC
Start: 2020-12-02 — End: 2021-12-28

## 2020-12-02 MED ORDER — OMEPRAZOLE 40 MG PO CPDR: 40.0000 mg | DELAYED_RELEASE_CAPSULE | Freq: Every day | ORAL | 3 refills | Status: DC

## 2020-12-02 MED ORDER — ALLOPURINOL 100 MG PO TABS: 100.0000 mg | ORAL_TABLET | Freq: Every day | ORAL | 3 refills | Status: DC

## 2020-12-02 NOTE — Addendum Note (Signed)
Addended by: Elmer Picker on: 12/02/2020 07:43 AM   Modules accepted: Orders

## 2020-12-02 NOTE — Progress Notes (Signed)
Subjective:    Patient ID: AGRON NICHOLES, male    DOB: [birth year], 65 y.o.   MRN: [id number]  HPI Patient presents for yearly preventative medicine examination. He is a pleasant 65 year old male who  has a past medical history of Anemia, Anxiety, Arthritis, B12 deficiency, Cancer ([place]), Cellulitis of arm, left (10/2017), Cervical disc disease, Chest pain at rest (02/25/2013), Chronic lower back pain, Complication of anesthesia, Depression, Diverticulosis of colon, GERD (gastroesophageal reflux disease), Gout, Hyperlipidemia, Hypertension, Pleurisy, Shortness of breath dyspnea, and Tobacco abuse.  Hx of Glottis Carcinoma - diagnosed in 2020.  He completed radiation treatment.  Currently doing well and has no complaints.  Follows up with oncology and ENT on a regular basis  Anxiety - well controlled with Zoloft 100 mg daily.   Essential Hypertension - takes Cozaar 100 mg and Cardizem 360 mg ER daily.  Denies dizziness, lightheadedness, chest pain, or shortness of breath. He has not taken his medication this morning. He does not check his blood pressure at home.  BP Readings from Last 3 Encounters:  12/02/20 (!) 160/90  11/11/20 (!) 145/85  10/01/20 (!) 144/85   Hyperlipidemia - takes lipitor 80 mg daily. He denies myalgia or fatigue  Lab Results  Component Value Date   CHOL 203 (H) 11/11/2019   HDL 70.20 11/11/2019   LDLCALC 112 (H) 11/11/2019   LDLDIRECT 132.0 09/18/2018   TRIG 100.0 11/11/2019   CHOLHDL 3 11/11/2019   Hx of Gout - controlled with allopurinol 100 mg daily.  Denies any acute gout flares  Osteoarthritis-Oertli managed by otology with Norco.  Reports good pain relief with this regimen  All immunizations and health maintenance protocols were reviewed with the patient and needed orders were placed.  Appropriate screening laboratory values were ordered for the patient including screening of hyperlipidemia, renal function and hepatic function. If indicated by BPH, a PSA  was ordered.  Medication reconciliation,  past medical history, social history, problem list and allergies were reviewed in detail with the patient  Goals were established with regard to weight loss, exercise, and  diet in compliance with medications Wt Readings from Last 3 Encounters:  12/02/20 174 lb (78.9 kg)  11/11/20 175 lb (79.4 kg)  10/01/20 173 lb 8 oz (78.7 kg)   He is up to date on routine colon cancer screening   Review of Systems  Constitutional: Negative.   HENT: Negative.   Eyes: Negative.   Respiratory: Negative.   Cardiovascular: Negative.   Gastrointestinal: Negative.   Endocrine: Negative.   Genitourinary: Negative.   Musculoskeletal: Positive for arthralgias and back pain.  Skin: Negative.   Allergic/Immunologic: Negative.   Neurological: Negative.   Hematological: Negative.   Psychiatric/Behavioral: Negative.   All other systems reviewed and are negative.  Past Medical History:  Diagnosis Date  . Anemia   . Anxiety   . Arthritis   . B12 deficiency   . Cancer (HCC)    throat cancer   . Cellulitis of arm, left 10/2017   after cat scratch  . Cervical disc disease    a. 07/2003 ant cervical deomcpression and fusion C5-6/C6-7;  b. 09/2007 post cervical laminectomy C4-5 with scres and arthrodesis C4-C7.  Marland Kitchen Chest pain at rest 02/25/2013  . Chronic lower back pain   . Complication of anesthesia    " i WOKE UP DURING A COLONOSCOPY "- 2008  . Depression   . Diverticulosis of colon   . GERD (gastroesophageal reflux disease)   .  Gout   . Hyperlipidemia   . Hypertension   . Pleurisy   . Shortness of breath dyspnea    with exertion- "I cant exercise now"  . Tobacco abuse    a. 40 yrs, 1.5-3 ppd over that time.    Social History   Socioeconomic History  . Marital status: Married    Spouse name: Not on file  . Number of children: Not on file  . Years of education: Not on file  . Highest education level: Not on file  Occupational History  . Not on  file  Tobacco Use  . Smoking status: Former Smoker    Packs/day: 1.00    Years: 42.00    Pack years: 42.00    Types: Cigarettes    Start date: 1979    Quit date: 06/08/2019    Years since quitting: 1.4  . Smokeless tobacco: Never Used  . Tobacco comment: He is smoking about 4-5 cigarettes, He had smoked up to 3 packs daily. 06/03/19  Vaping Use  . Vaping Use: Never used  Substance and Sexual Activity  . Alcohol use: Yes    Comment: occasional  . Drug use: No  . Sexual activity: Not on file  Other Topics Concern  . Not on file  Social History Narrative   Lives in [place] with wife.  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   Social Determinants of Health   Financial Resource Strain: Not on file  Food Insecurity: Not on file  Transportation Needs: Not on file  Physical Activity: Not on file  Stress: Not on file  Social Connections: Not on file  Intimate Partner Violence: Not on file    Past Surgical History:  Procedure Laterality Date  . ANTERIOR LATERAL LUMBAR FUSION 4 LEVELS Right 06/05/2014   Procedure: ANTERIOR LATERAL LUMBAR FUSION  LUMBAR ONE TO LUMBAR FIVE with Percutaneous Pedicle Screws;  Surgeon: Erline Levine, MD;  Location: [facility];  Service: Neurosurgery;  Laterality: Right;  . CARPAL TUNNEL RELEASE Right   . CERVICAL DISC SURGERY     X 2  . COLONOSCOPY    . ELBOW ARTHROSCOPY Right   . KNEE ARTHROSCOPY Right 08/2013   torn menicus  . KNEE ARTHROSCOPY Left 11/2013   chip cartlidge,torn menicus  . LUMBAR LAMINECTOMY/DECOMPRESSION MICRODISCECTOMY Right 07/11/2013   Procedure: LUMBAR LAMINECTOMY/DECOMPRESSION MICRODISCECTOMY 1 LEVEL;  Surgeon: Erline Levine, MD;  Location: [facility];  Service: Neurosurgery;  Laterality: Right;  Right L34 microdiskectomy  . LUMBAR PERCUTANEOUS PEDICLE SCREW 4 LEVEL N/A 06/05/2014   Procedure: LUMBAR PERCUTANEOUS PEDICLE SCREW 4 LEVEL;  Surgeon: Erline Levine, MD;  Location: [facility];  Service:  Neurosurgery;  Laterality: N/A;  . MICROLARYNGOSCOPY Left 05/12/2019   Procedure: MICROLARYNGOSCOPY WITH BIOPSY;  Surgeon: Rozetta Nunnery, MD;  Location: [facility];  Service: ENT;  Laterality: Left;  . NECK SURGERY  January 2005, March 2009   C-Spine  . POLYPECTOMY    . ROTATOR CUFF REPAIR  August 2000, January 2002, July 2008, July 2009   2 left 2 right    Family History  Problem Relation Age of Onset  . Bone cancer Mother   . Squamous cell carcinoma Mother        died @ 59  . Heart attack Mother   . Hypertension Father   . Diabetes Father        borderline  . Congestive Heart Failure Father  alive @ 83  . Heart failure Father   . Sudden death Brother        died @ 6  . Other Brother        died in [place] @ 13 - struck by drunk driver  . Other Brother        died in M[place] @ 8 - struck by drunk driver  . Heart attack Brother   . Stroke Paternal Grandfather   . Colon cancer Neg Hx   . Esophageal cancer Neg Hx   . Rectal cancer Neg Hx   . Stomach cancer Neg Hx   . Colon polyps Neg Hx     Allergies  Allergen Reactions  . Lisinopril Swelling    Swollen Lip  . Percodan [Oxycodone-Aspirin] Rash    Percodan caused rash---but he can tolerate Percocet and aspirin on their own     Current Outpatient Medications on File Prior to Visit  Medication Sig Dispense Refill  . albuterol (VENTOLIN HFA) 108 (90 Base) MCG/ACT inhaler Inhale 2 puffs into the lungs every 6 (six) hours as needed for wheezing or shortness of breath. 8 g 0  . allopurinol (ZYLOPRIM) 100 MG tablet Take 1 tablet (100 mg total) by mouth daily. 30 tablet 0  . atorvastatin (LIPITOR) 80 MG tablet TAKE 1 TABLET (80 MG TOTAL) BY MOUTH DAILY. APPOINTMENT NEEDED FOR FUTURE REFILLS* 30 tablet 0  . colchicine 0.6 MG tablet TAKE 1 TABLET (0.6 MG TOTAL) BY MOUTH DAILY AS NEEDED (GOUT). 90 tablet 1  . cyclobenzaprine (FLEXERIL) 10 MG tablet Take 10 mg by mouth daily.    . diclofenac sodium  (VOLTAREN) 1 % GEL Apply 2 g topically as needed (for pain).   0  . diltiazem (CARDIZEM CD) 360 MG 24 hr capsule TAKE 1 CAPSULE BY MOUTH EVERY DAY 30 capsule 0  . HYDROcodone-acetaminophen (NORCO/VICODIN) 5-325 MG tablet Take 1 tablet by mouth every 8 (eight) hours as needed for moderate pain.   0  . losartan (COZAAR) 100 MG tablet Take 1 tablet (100 mg total) by mouth daily. 30 tablet 0  . omeprazole (PRILOSEC OTC) 20 MG tablet Take 2 tablets (40 mg total) by mouth daily.    . sertraline (ZOLOFT) 100 MG tablet Take 1 tablet (100 mg total) by mouth daily. 30 tablet 0  . vitamin B-12 (CYANOCOBALAMIN) 1000 MCG tablet Take 1,000 mcg by mouth daily.     No current facility-administered medications on file prior to visit.    BP (!) 160/90 (BP Location: Left Arm, Patient Position: Sitting, Cuff Size: Normal)   Pulse 92   Temp 98.3 F (36.8 C) (Oral)   Ht 5\' 8"  (1.727 m)   Wt 174 lb (78.9 kg)   SpO2 94%   BMI 26.46 kg/m       Objective:   Physical Exam Vitals and nursing note reviewed.  Constitutional:      General: He is not in acute distress.    Appearance: Normal appearance. He is well-developed and normal weight.  HENT:     Head: Normocephalic and atraumatic.     Right Ear: Tympanic membrane, ear canal and external ear normal. There is no impacted cerumen.     Left Ear: Tympanic membrane, ear canal and external ear normal. There is no impacted cerumen.     Nose: Nose normal. No congestion or rhinorrhea.     Mouth/Throat:     Mouth: Mucous membranes are moist.     Pharynx: Oropharynx is clear. No oropharyngeal  exudate or posterior oropharyngeal erythema.  Eyes:     General:        Right eye: No discharge.        Left eye: No discharge.     Extraocular Movements: Extraocular movements intact.     Conjunctiva/sclera: Conjunctivae normal.     Pupils: Pupils are equal, round, and reactive to light.  Neck:     Vascular: No carotid bruit.     Trachea: No tracheal deviation.   Cardiovascular:     Rate and Rhythm: Normal rate and regular rhythm.     Pulses: Normal pulses.     Heart sounds: Normal heart sounds. No murmur heard. No friction rub. No gallop.   Pulmonary:     Effort: Pulmonary effort is normal. No respiratory distress.     Breath sounds: Normal breath sounds. No stridor. No wheezing, rhonchi or rales.  Chest:     Chest wall: No tenderness.  Abdominal:     General: Bowel sounds are normal. There is no distension.     Palpations: Abdomen is soft. There is no mass.     Tenderness: There is no abdominal tenderness. There is no right CVA tenderness, left CVA tenderness, guarding or rebound.     Hernia: No hernia is present.  Musculoskeletal:        General: No swelling, tenderness, deformity or signs of injury. Normal range of motion.     Right lower leg: No edema.     Left lower leg: No edema.  Lymphadenopathy:     Cervical: No cervical adenopathy.  Skin:    General: Skin is warm and dry.     Capillary Refill: Capillary refill takes less than 2 seconds.     Coloration: Skin is not jaundiced or pale.     Findings: No bruising, erythema, lesion or rash.  Neurological:     General: No focal deficit present.     Mental Status: He is alert and oriented to person, place, and time.     Cranial Nerves: No cranial nerve deficit.     Sensory: No sensory deficit.     Motor: No weakness.     Coordination: Coordination normal.     Gait: Gait abnormal (limping gait ).     Deep Tendon Reflexes: Reflexes normal.  Psychiatric:        Mood and Affect: Mood normal.        Behavior: Behavior normal.        Thought Content: Thought content normal.        Judgment: Judgment normal.       Assessment & Plan:  1.1. Encounter for general adult medical examination with abnormal findings - Follow up in one year or sooner if needed - Encouraged heart healthy lifestyle  - CBC with Differential/Platelet; Future - Comprehensive metabolic panel; Future - Lipid  panel; Future - TSH; Future  2. Essential hypertension - Elevated today but did not take his meds - Will have him monitor at home and send me results in a week via mychart  - CBC with Differential/Platelet; Future - Comprehensive metabolic panel; Future - Lipid panel; Future - TSH; Future - diltiazem (CARDIZEM CD) 360 MG 24 hr capsule; TAKE 1 CAPSULE BY MOUTH EVERY DAY  Dispense: 90 capsule; Refill: 3 - losartan (COZAAR) 100 MG tablet; Take 1 tablet (100 mg total) by mouth daily.  Dispense: 90 tablet; Refill: 3  3. Anxiety disorder, unspecified type - Controlled.  - CBC with Differential/Platelet; Future - Comprehensive metabolic  panel; Future - Lipid panel; Future - TSH; Future - sertraline (ZOLOFT) 100 MG tablet; Take 1 tablet (100 mg total) by mouth daily.  Dispense: 90 tablet; Refill: 1  4. Hyperlipidemia, unspecified hyperlipidemia type - Consider adding zetia  - CBC with Differential/Platelet; Future - Comprehensive metabolic panel; Future - Lipid panel; Future - TSH; Future - atorvastatin (LIPITOR) 80 MG tablet; Take 1 tablet (80 mg total) by mouth daily.  Dispense: 90 tablet; Refill: 3  5. Idiopathic gout, unspecified chronicity, unspecified site  - CBC with Differential/Platelet; Future - Comprehensive metabolic panel; Future - Lipid panel; Future - TSH; Future - allopurinol (ZYLOPRIM) 100 MG tablet; Take 1 tablet (100 mg total) by mouth daily.  Dispense: 90 tablet; Refill: 3  6. Prostate cancer screening  - PSA; Future  7. Glottis carcinoma ([place]) - Follow up with ENT and oncology   8. Pseudoarthrosis of lumbar spine - Follow up with rheumatology as directed  9. Need for hepatitis C screening test  - Hep C Antibody; Future   Dorothyann Peng, NP

## 2020-12-02 NOTE — Patient Instructions (Signed)
It was great seeing you today   We will follow up with you regarding your blood work   Please sent me blood pressure readings in the next week   I will see you back in one year or sooner if needed

## 2020-12-03 LAB — HEPATITIS C ANTIBODY
Hepatitis C Ab: NONREACTIVE
SIGNAL TO CUT-OFF: 0 (ref <1.00)

## 2020-12-11 ENCOUNTER — Other Ambulatory Visit: Payer: Self-pay

## 2020-12-11 ENCOUNTER — Ambulatory Visit
Admission: RE | Admit: 2020-12-11 | Discharge: 2020-12-11 | Disposition: A | Discharge status: Home / Self Care | Payer: No Typology Code available for payment source | Source: Ambulatory Visit | Attending: Neurosurgery | Admitting: Neurosurgery

## 2020-12-11 DIAGNOSIS — S32009K Unspecified fracture of unspecified lumbar vertebra, subsequent encounter for fracture with nonunion: Secondary | ICD-10-CM

## 2020-12-11 DIAGNOSIS — M545 Low back pain, unspecified: Secondary | ICD-10-CM | POA: Diagnosis not present

## 2020-12-15 ENCOUNTER — Other Ambulatory Visit: Payer: Self-pay

## 2020-12-15 ENCOUNTER — Ambulatory Visit (INDEPENDENT_AMBULATORY_CARE_PROVIDER_SITE_OTHER): Payer: No Typology Code available for payment source | Admitting: Otolaryngology

## 2020-12-15 DIAGNOSIS — Z981 Arthrodesis status: Secondary | ICD-10-CM | POA: Diagnosis not present

## 2020-12-15 DIAGNOSIS — M5416 Radiculopathy, lumbar region: Secondary | ICD-10-CM | POA: Diagnosis not present

## 2020-12-15 DIAGNOSIS — Z8521 Personal history of malignant neoplasm of larynx: Secondary | ICD-10-CM | POA: Diagnosis not present

## 2020-12-15 DIAGNOSIS — M4120 Other idiopathic scoliosis, site unspecified: Secondary | ICD-10-CM | POA: Diagnosis not present

## 2020-12-15 DIAGNOSIS — S32009K Unspecified fracture of unspecified lumbar vertebra, subsequent encounter for fracture with nonunion: Secondary | ICD-10-CM | POA: Diagnosis not present

## 2020-12-15 NOTE — Progress Notes (Signed)
HPI: Aaron Black is a 65 y.o. male who returns today for evaluation of vocal cord cancer initially diagnosed in October 2020.  This was a T1 lesion treated with radiation therapy.  He has recently seen Dr. Isidore Moos 3 months ago with clear exam.  He is doing well with no significant hoarseness although he does not feel like his voice is back to normal.  Past Medical History:  Diagnosis Date  . Anemia   . Anxiety   . Arthritis   . B12 deficiency   . Cancer (HCC)    throat cancer   . Cellulitis of arm, left 10/2017   after cat scratch  . Cervical disc disease    a. 07/2003 ant cervical deomcpression and fusion C5-6/C6-7;  b. 09/2007 post cervical laminectomy C4-5 with scres and arthrodesis C4-C7.  Marland Kitchen Chest pain at rest 02/25/2013  . Chronic lower back pain   . Complication of anesthesia    " i WOKE UP DURING A COLONOSCOPY "- 2008  . Depression   . Diverticulosis of colon   . GERD (gastroesophageal reflux disease)   . Gout   . Hyperlipidemia   . Hypertension   . Pleurisy   . Shortness of breath dyspnea    with exertion- "I cant exercise now"  . Tobacco abuse    a. 40 yrs, 1.5-3 ppd over that time.   Past Surgical History:  Procedure Laterality Date  . ANTERIOR LATERAL LUMBAR FUSION 4 LEVELS Right 06/05/2014   Procedure: ANTERIOR LATERAL LUMBAR FUSION  LUMBAR ONE TO LUMBAR FIVE with Percutaneous Pedicle Screws;  Surgeon: Erline Levine, MD;  Location: [facility];  Service: Neurosurgery;  Laterality: Right;  . CARPAL TUNNEL RELEASE Right   . CERVICAL DISC SURGERY     X 2  . COLONOSCOPY    . ELBOW ARTHROSCOPY Right   . KNEE ARTHROSCOPY Right 08/2013   torn menicus  . KNEE ARTHROSCOPY Left 11/2013   chip cartlidge,torn menicus  . LUMBAR LAMINECTOMY/DECOMPRESSION MICRODISCECTOMY Right 07/11/2013   Procedure: LUMBAR LAMINECTOMY/DECOMPRESSION MICRODISCECTOMY 1 LEVEL;  Surgeon: Erline Levine, MD;  Location: [facility];  Service: Neurosurgery;  Laterality: Right;  Right L34  microdiskectomy  . LUMBAR PERCUTANEOUS PEDICLE SCREW 4 LEVEL N/A 06/05/2014   Procedure: LUMBAR PERCUTANEOUS PEDICLE SCREW 4 LEVEL;  Surgeon: Erline Levine, MD;  Location: [facility];  Service: Neurosurgery;  Laterality: N/A;  . MICROLARYNGOSCOPY Left 05/12/2019   Procedure: MICROLARYNGOSCOPY WITH BIOPSY;  Surgeon: Rozetta Nunnery, MD;  Location: [facility];  Service: ENT;  Laterality: Left;  . NECK SURGERY  January 2005, March 2009   C-Spine  . POLYPECTOMY    . ROTATOR CUFF REPAIR  August 2000, January 2002, July 2008, July 2009   2 left 2 right   Social History   Socioeconomic History  . Marital status: Married    Spouse name: Not on file  . Number of children: Not on file  . Years of education: Not on file  . Highest education level: Not on file  Occupational History  . Not on file  Tobacco Use  . Smoking status: Former Smoker    Packs/day: 1.00    Years: 42.00    Pack years: 42.00    Types: Cigarettes    Start date: 1979    Quit date: 06/08/2019    Years since quitting: 1.5  . Smokeless tobacco: Never Used  . Tobacco comment: He is smoking about 4-5 cigarettes, He had smoked up to 3 packs daily.  06/03/19  Vaping Use  . Vaping Use: Never used  Substance and Sexual Activity  . Alcohol use: Yes    Comment: occasional  . Drug use: No  . Sexual activity: Not on file  Other Topics Concern  . Not on file  Social History Narrative   Lives in [place] with wife.  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   Social Determinants of Health   Financial Resource Strain: Not on file  Food Insecurity: Not on file  Transportation Needs: Not on file  Physical Activity: Not on file  Stress: Not on file  Social Connections: Not on file   Family History  Problem Relation Age of Onset  . Bone cancer Mother   . Squamous cell carcinoma Mother        died @ 43  . Heart attack Mother   . Hypertension Father   . Diabetes Father         borderline  . Congestive Heart Failure Father        alive @ 42  . Heart failure Father   . Sudden death Brother        died @ 35  . Other Brother        died in [place] @ 60 - struck by drunk driver  . Other Brother        died in MVA @ 16 - struck by drunk driver  . Heart attack Brother   . Stroke Paternal Grandfather   . Colon cancer Neg Hx   . Esophageal cancer Neg Hx   . Rectal cancer Neg Hx   . Stomach cancer Neg Hx   . Colon polyps Neg Hx    Allergies  Allergen Reactions  . Lisinopril Swelling    Swollen Lip  . Percodan [Oxycodone-Aspirin] Rash    Percodan caused rash---but he can tolerate Percocet and aspirin on their own    Prior to Admission medications   Medication Sig Start Date End Date Taking? Authorizing Provider  albuterol (VENTOLIN HFA) 108 (90 Base) MCG/ACT inhaler Inhale 2 puffs into the lungs every 6 (six) hours as needed for wheezing or shortness of breath. 04/12/19   Zigmund Gottron, NP  allopurinol (ZYLOPRIM) 100 MG tablet Take 1 tablet (100 mg total) by mouth daily. 12/02/20   Nafziger, Tommi Rumps, NP  atorvastatin (LIPITOR) 80 MG tablet Take 1 tablet (80 mg total) by mouth daily. 12/02/20   Nafziger, Tommi Rumps, NP  colchicine 0.6 MG tablet TAKE 1 TABLET (0.6 MG TOTAL) BY MOUTH DAILY AS NEEDED (GOUT). 03/20/18   Marletta Lor, MD  cyclobenzaprine (FLEXERIL) 10 MG tablet Take 10 mg by mouth daily. 12/27/18   [provider]  diclofenac sodium (VOLTAREN) 1 % GEL Apply 2 g topically as needed (for pain).  04/01/17   [provider]  diltiazem (CARDIZEM CD) 360 MG 24 hr capsule TAKE 1 CAPSULE BY MOUTH EVERY DAY 12/02/20   Nafziger, Tommi Rumps, NP  HYDROcodone-acetaminophen (NORCO/VICODIN) 5-325 MG tablet Take 1 tablet by mouth every 8 (eight) hours as needed for moderate pain.  09/24/17   [provider]  losartan (COZAAR) 100 MG tablet Take 1 tablet (100 mg total) by mouth daily. 12/02/20 03/02/21  Nafziger, Tommi Rumps, NP  omeprazole (PRILOSEC OTC) 20 MG tablet  Take 2 tablets (40 mg total) by mouth daily. 02/27/13   Viyuoh, Alison Stalling, MD  omeprazole (PRILOSEC) 40 MG capsule Take 1 capsule (40 mg total) by mouth daily. 12/02/20  Nafziger, Tommi Rumps, NP  sertraline (ZOLOFT) 100 MG tablet Take 1 tablet (100 mg total) by mouth daily. 12/02/20   Nafziger, Tommi Rumps, NP  vitamin B-12 (CYANOCOBALAMIN) 1000 MCG tablet Take 1,000 mcg by mouth daily.    [provider]     Positive ROS: Otherwise negative  All other systems have been reviewed and were otherwise negative with the exception of those mentioned in the HPI and as above.  Physical Exam: Constitutional: Alert, well-appearing, no acute distress Ears: External ears without lesions or tenderness. Ear canals are clear bilaterally with intact, clear TMs.  Nasal: External nose without lesions. Septum deviated to the left. Clear nasal passages Oral: Lips and gums without lesions. Tongue and palate mucosa without lesions. Posterior oropharynx clear.  Tonsil regions are benign in appearance. Fiberoptic laryngoscopy was performed through the right nostril.  The nasopharynx was clear.  The base of tongue vallecula and epiglottis were normal.  Evaluation of both cords had mild edema of the vocal cords but clear vocal cords bilaterally with no mucosal lesions noted.  Normal vocal mobility. Neck: No palpable adenopathy or masses.  No palpable adenopathy in the neck on either side Respiratory: Breathing comfortably  Skin: No facial/neck lesions or rash noted.  Laryngoscopy  Date/Time: 12/15/2020 8:42 AM Performed by: Rozetta Nunnery, MD Authorized by: Rozetta Nunnery, MD   Consent:    Consent obtained:  Verbal   Consent given by:  Patient Procedure details:    Indications: oncologic surveillance follow-up     Medication:  Afrin   Instrument: flexible fiberoptic laryngoscope     Scope location: right nare   Sinus:    Right nasopharynx: normal   Mouth:    Oropharynx: normal     Vallecula:  normal     Base of tongue: normal     Epiglottis: normal   Throat:    True vocal cords: normal   Comments:     On fiberoptic laryngoscopy several times vocal cords were clear bilaterally with normal vocal mobility.  No evidence of persistent lesions.    Assessment: History of T1 vocal cord cancer more pronounced on the left true vocal cord that extended to the anterior commissure and right anterior cord. No evidence of recurrent or persistent disease.  Plan: He will follow-up here in 6 months for recheck.  He will return earlier if he has any voice changes.   Radene Journey, MD

## 2020-12-16 ENCOUNTER — Inpatient Hospital Stay: Admission: RE | Admit: 2020-12-16 | Payer: No Typology Code available for payment source | Source: Ambulatory Visit

## 2020-12-16 ENCOUNTER — Telehealth: Payer: Self-pay | Admitting: Adult Health

## 2020-12-16 DIAGNOSIS — M5416 Radiculopathy, lumbar region: Secondary | ICD-10-CM | POA: Diagnosis not present

## 2020-12-16 NOTE — Telephone Encounter (Signed)
Patient wife is calling and wanted to give provider patients blood pressure readings for the week. Per wife BP was talking at different times between 930 am- 1230pm. 5/13- 118/69 5/14- 131/77 5/15/-123/75 5/16- 127-76  5/17- 146/80 5/18- 148/79 5/19- 148/88  CB is [phone]

## 2020-12-16 NOTE — Telephone Encounter (Signed)
Noted! Spouse notified that readings were sent to [facility]. No further action needed!

## 2020-12-28 ENCOUNTER — Encounter: Payer: Self-pay | Admitting: Vascular Surgery

## 2020-12-28 ENCOUNTER — Other Ambulatory Visit: Payer: Self-pay

## 2020-12-28 ENCOUNTER — Ambulatory Visit (INDEPENDENT_AMBULATORY_CARE_PROVIDER_SITE_OTHER): Payer: 59 | Admitting: Vascular Surgery

## 2020-12-28 DIAGNOSIS — G8929 Other chronic pain: Secondary | ICD-10-CM

## 2020-12-28 DIAGNOSIS — M549 Dorsalgia, unspecified: Secondary | ICD-10-CM | POA: Insufficient documentation

## 2020-12-28 DIAGNOSIS — M5442 Lumbago with sciatica, left side: Secondary | ICD-10-CM

## 2020-12-28 NOTE — Progress Notes (Signed)
Patient name: Aaron Black MRN: [id number] DOB: 1955-12-03 Sex: male  REASON FOR CONSULT: Evaluate for L4-L5 ALIF  HPI: Aaron Black is a 65 y.o. male, with multiple medical problems including chronic back pain, hypertension, hyperlipidemia that presents for preop evaluation of possible L4-L5 ALIF.  Patient has a complex history of spine surgery and initially underwent a microdiscectomy 2014 at L3-L4.  Later in 2015 he had anterior lateral lumbar fusion from L1-L5 with pedicle screws.  Most recently had revision of the posterior hardware in 2017.  Sound like he was doing well until earlier this year in January when he fell on the ice.  He now has severe radiculopathy in the left leg that keeps him awake at night and is on chronic narcotics.  Has been evaluated by Dr. Vertell Limber and given multiple previous posterior instrumentation he feels he is a candidate for only an anterior approach.  He denies any previous abdominal surgery.  Past Medical History:  Diagnosis Date  . Anemia   . Anxiety   . Arm pain   . Arthritis   . B12 deficiency   . Back pain   . Cancer (HCC)    throat cancer   . Cellulitis of arm, left 10/2017   after cat scratch  . Cervical disc disease    a. 07/2003 ant cervical deomcpression and fusion C5-6/C6-7;  b. 09/2007 post cervical laminectomy C4-5 with scres and arthrodesis C4-C7.  Marland Kitchen Chest pain at rest 02/25/2013  . Chronic lower back pain   . Complication of anesthesia    " i WOKE UP DURING A COLONOSCOPY "- 2008  . Depression   . Diverticulosis of colon   . GERD (gastroesophageal reflux disease)   . Gout   . Hyperlipidemia   . Hypertension   . Joint pain   . Leg pain    While walking  . Leg weakness   . Neck pain   . Pleurisy   . Shortness of breath dyspnea    with exertion- "I cant exercise now"  . Tobacco abuse    a. 40 yrs, 1.5-3 ppd over that time.    Past Surgical History:  Procedure Laterality Date  . ANTERIOR LATERAL LUMBAR FUSION 4 LEVELS  Right 06/05/2014   Procedure: ANTERIOR LATERAL LUMBAR FUSION  LUMBAR ONE TO LUMBAR FIVE with Percutaneous Pedicle Screws;  Surgeon: Erline Levine, MD;  Location: [facility];  Service: Neurosurgery;  Laterality: Right;  . CARPAL TUNNEL RELEASE Right   . CERVICAL DISC SURGERY     X 2  . COLONOSCOPY    . ELBOW ARTHROSCOPY Right   . KNEE ARTHROSCOPY Right 08/2013   torn menicus  . KNEE ARTHROSCOPY Left 11/2013   chip cartlidge,torn menicus  . LUMBAR LAMINECTOMY/DECOMPRESSION MICRODISCECTOMY Right 07/11/2013   Procedure: LUMBAR LAMINECTOMY/DECOMPRESSION MICRODISCECTOMY 1 LEVEL;  Surgeon: Erline Levine, MD;  Location: [facility];  Service: Neurosurgery;  Laterality: Right;  Right L34 microdiskectomy  . LUMBAR PERCUTANEOUS PEDICLE SCREW 4 LEVEL N/A 06/05/2014   Procedure: LUMBAR PERCUTANEOUS PEDICLE SCREW 4 LEVEL;  Surgeon: Erline Levine, MD;  Location: [facility];  Service: Neurosurgery;  Laterality: N/A;  . MICROLARYNGOSCOPY Left 05/12/2019   Procedure: MICROLARYNGOSCOPY WITH BIOPSY;  Surgeon: Rozetta Nunnery, MD;  Location: [facility];  Service: ENT;  Laterality: Left;  . NECK SURGERY  January 2005, March 2009   C-Spine  . POLYPECTOMY    . ROTATOR CUFF REPAIR  August 2000, January 2002, July 2008, July 2009  2 left 2 right    Family History  Problem Relation Age of Onset  . Bone cancer Mother   . Squamous cell carcinoma Mother        died @ 29  . Heart attack Mother   . Hypertension Father   . Diabetes Father        borderline  . Congestive Heart Failure Father        alive @ 62  . Heart failure Father   . Sudden death Brother        died @ 73  . Other Brother        died in [place] @ 36 - struck by drunk driver  . Other Brother        died in [place] @ 45 - struck by drunk driver  . Heart attack Brother   . Stroke Paternal Grandfather   . Colon cancer Neg Hx   . Esophageal cancer Neg Hx   . Rectal cancer Neg Hx   . Stomach cancer Neg Hx   . Colon polyps Neg  Hx     SOCIAL HISTORY: Social History   Socioeconomic History  . Marital status: Married    Spouse name: Not on file  . Number of children: Not on file  . Years of education: Not on file  . Highest education level: Not on file  Occupational History  . Not on file  Tobacco Use  . Smoking status: Former Smoker    Packs/day: 1.00    Years: 42.00    Pack years: 42.00    Types: Cigarettes    Start date: 1979    Quit date: 06/08/2019    Years since quitting: 1.5  . Smokeless tobacco: Never Used  . Tobacco comment: He is smoking about 4-5 cigarettes, He had smoked up to 3 packs daily. 06/03/19  Vaping Use  . Vaping Use: Never used  Substance and Sexual Activity  . Alcohol use: Yes    Comment: occasional  . Drug use: No  . Sexual activity: Not on file  Other Topics Concern  . Not on file  Social History Narrative   Lives in [place] with wife.  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   Social Determinants of Health   Financial Resource Strain: Not on file  Food Insecurity: Not on file  Transportation Needs: Not on file  Physical Activity: Not on file  Stress: Not on file  Social Connections: Not on file  Intimate Partner Violence: Not on file    Allergies  Allergen Reactions  . Lisinopril Swelling    Swollen Lip  . Percodan [Oxycodone-Aspirin] Rash    Percodan caused rash---but he can tolerate Percocet and aspirin on their own     Current Outpatient Medications  Medication Sig Dispense Refill  . albuterol (VENTOLIN HFA) 108 (90 Base) MCG/ACT inhaler Inhale 2 puffs into the lungs every 6 (six) hours as needed for wheezing or shortness of breath. 8 g 0  . allopurinol (ZYLOPRIM) 100 MG tablet Take 1 tablet (100 mg total) by mouth daily. 90 tablet 3  . atorvastatin (LIPITOR) 80 MG tablet Take 1 tablet (80 mg total) by mouth daily. 90 tablet 3  . colchicine 0.6 MG tablet TAKE 1 TABLET (0.6 MG TOTAL) BY MOUTH DAILY AS NEEDED (GOUT). 90 tablet 1   . cyclobenzaprine (FLEXERIL) 10 MG tablet Take 10 mg by mouth daily.    . diclofenac sodium (VOLTAREN) 1 % GEL Apply 2  g topically as needed (for pain).   0  . diltiazem (CARDIZEM CD) 360 MG 24 hr capsule TAKE 1 CAPSULE BY MOUTH EVERY DAY 90 capsule 3  . HYDROcodone-acetaminophen (NORCO/VICODIN) 5-325 MG tablet Take 1 tablet by mouth every 8 (eight) hours as needed for moderate pain.   0  . losartan (COZAAR) 100 MG tablet Take 1 tablet (100 mg total) by mouth daily. 90 tablet 3  . omeprazole (PRILOSEC OTC) 20 MG tablet Take 2 tablets (40 mg total) by mouth daily.    Marland Kitchen omeprazole (PRILOSEC) 40 MG capsule Take 1 capsule (40 mg total) by mouth daily. 90 capsule 3  . sertraline (ZOLOFT) 100 MG tablet Take 1 tablet (100 mg total) by mouth daily. 90 tablet 1  . vitamin B-12 (CYANOCOBALAMIN) 1000 MCG tablet Take 1,000 mcg by mouth daily.     No current facility-administered medications for this visit.    REVIEW OF SYSTEMS:  [X]  denotes positive finding, [ ]  denotes negative finding Cardiac  Comments:  Chest pain or chest pressure:    Shortness of breath upon exertion:    Short of breath when lying flat:    Irregular heart rhythm:        Vascular    Pain in calf, thigh, or hip brought on by ambulation:    Pain in feet at night that wakes you up from your sleep:     Blood clot in your veins:    Leg swelling:         Pulmonary    Oxygen at home:    Productive cough:     Wheezing:         Neurologic    Sudden weakness in arms or legs:     Sudden numbness in arms or legs:     Sudden onset of difficulty speaking or slurred speech:    Temporary loss of vision in one eye:     Problems with dizziness:         Gastrointestinal    Blood in stool:     Vomited blood:         Genitourinary    Burning when urinating:     Blood in urine:        Psychiatric    Major depression:         Hematologic    Bleeding problems:    Problems with blood clotting too easily:        Skin    Rashes  or ulcers:        Constitutional    Fever or chills:      PHYSICAL EXAM: Vitals:   12/28/20 1037  BP: (!) 159/90  Pulse: 81  Resp: 18  Temp: 98 F (36.7 C)  TempSrc: Temporal  SpO2: 97%  Weight: 172 lb (78 kg)  Height: 5\' 8"  (1.727 m)    GENERAL: The patient is a well-nourished male, in no acute distress. The vital signs are documented above. CARDIAC: There is a regular rate and rhythm.  VASCULAR:  Palpable femoral pulses bilaterally Palpable dorsalis pedis pulses bilaterally PULMONARY: No respiratory distress. ABDOMEN: Soft and non-tender..  MUSCULOSKELETAL: There are no major deformities or cyanosis. NEUROLOGIC: No focal weakness or paresthesias are detected. SKIN: There are no ulcers or rashes noted. PSYCHIATRIC: The patient has a normal affect.  DATA:   I independently reviewed CT lumbar spine from 12/13/2020 and he has moderate to severe aortoiliac calcification, posterior hardware from L1-L5.  Assessment/Plan:  65 year old male presents for preop  evaluation of L4-L5 ALIF.  Unfortunately he has had multiple posterior and lateral spinal surgeries with L1-L5 posterior fusion.  In discussing with Dr. Vertell Limber he feels he has no other options other than an anterior approach with need to explant the cage at L4-L5.  I reviewed the CT scan with the patient and his wife and discussed my concerns given he does have moderate to severe aortoiliac calcification.  Discussed this can be a very limiting factor for anterior exposure at the L4-L5 disc space given the requirement to move the distal aorta and iliac vessels.  I discussed my concerns and I discussed that this would be much higher risk given risk for injuring the vessel and risk of bleeding and ischemic leg.  Discussed would be a paramedian incision over the left rectus with mobilization of the peritoneum and left ureter to midline and then moving the left iliac artery and vein.  Discussed injury to the above structures.  He does  not feel his symptoms are manageable.  Ultimately it sounds like he is very debilitated and discussed I would  be willing to try an anterior approach if he feels that he cannot continue living life in his current state.  Discussed there would be some risk if I get in there and feel it is unsafe to proceed I would stop.   [name], MD [facility] Office: [phone]

## 2020-12-30 ENCOUNTER — Other Ambulatory Visit: Payer: Self-pay

## 2020-12-30 ENCOUNTER — Other Ambulatory Visit: Payer: Self-pay | Admitting: Neurosurgery

## 2020-12-31 ENCOUNTER — Other Ambulatory Visit (HOSPITAL_COMMUNITY)
Admission: RE | Admit: 2020-12-31 | Discharge: 2020-12-31 | Disposition: A | Discharge status: Home / Self Care | Payer: 59 | Source: Ambulatory Visit | Attending: Neurosurgery | Admitting: Neurosurgery

## 2020-12-31 DIAGNOSIS — Z20822 Contact with and (suspected) exposure to covid-19: Secondary | ICD-10-CM | POA: Insufficient documentation

## 2020-12-31 DIAGNOSIS — Z01812 Encounter for preprocedural laboratory examination: Secondary | ICD-10-CM | POA: Insufficient documentation

## 2020-12-31 LAB — SARS CORONAVIRUS 2 (TAT 6-24 HRS): SARS Coronavirus 2: NEGATIVE

## 2020-12-31 NOTE — Progress Notes (Signed)
  EKG - 01/27/2019 Chest x-ray - Denies  ECHO - 02/26/2013 Cardiac Cath - Denies CPAP - Denies  Pt denies hx of diabetes  Blood Thinner Instructions: N/A Aspirin Instructions: N/A  ERAS Protcol - Yes.  Pt instructed to continue clears until 0800 DOS  COVID TEST- 12/31/2020 Negative  Anesthesia review: No  -------------  SDW INSTRUCTIONS:  Your procedure is scheduled on 01/06/2021. Please report to [facility] Main Entrance "A" at 0830 A.M., and check in at the Admitting office. Call this number if you have problems the morning of surgery: [phone]   Remember: Do not eat after midnight the night before your surgery  You may drink clear liquids until 0800 the morning of your surgery.   Clear liquids allowed are: Water, Non-Citrus Juices (without pulp), Carbonated Beverages, Clear Tea, Black Coffee Only, and Gatorade   Medications to take morning of surgery with a sip of water include: Lipitor, colchicine, flexeril, cardizem, allopurinol, zoloft, prilosec.  As of today, STOP taking any Aspirin (unless otherwise instructed by your surgeon), Aleve, Naproxen, Ibuprofen, Motrin, Advil, Goody's, BC's, all herbal medications, fish oil, and all vitamins.    The Morning of Surgery Do not wear jewelry, make-up or nail polish. Do not wear lotions, powders,colognes, or deodorant Do not shave 48 hours prior to surgery.   Men may shave face and neck. Do not bring valuables to the hospital. [facility] is not responsible for any belongings or valuables.  If you are a smoker, DO NOT Smoke 24 hours prior to surgery If you wear a CPAP at night please bring your mask the morning of surgery  Remember that you must have someone to transport you home after your surgery, and remain with you for 24 hours if you are discharged the same day.  Please bring cases for contacts, glasses, hearing aids, dentures or bridgework because it cannot be worn into surgery.   Patients discharged the day of  surgery will not be allowed to drive home.   Please shower the NIGHT BEFORE/MORNING OF SURGERY (use antibacterial soap like DIAL soap if possible). Wear comfortable clothes the morning of surgery. Oral Hygiene is also important to reduce your risk of infection.  Remember - BRUSH YOUR TEETH THE MORNING OF SURGERY WITH YOUR REGULAR TOOTHPASTE  Patient denies shortness of breath, fever, cough and chest pain.

## 2020-12-31 NOTE — Anesthesia Preprocedure Evaluation (Addendum)
Anesthesia Evaluation  Patient identified by MRN, date of birth, ID band Patient awake    Reviewed: Allergy & Precautions, NPO status , Patient's Chart, lab work & pertinent test results  History of Anesthesia Complications Negative for: history of anesthetic complications  Airway Mallampati: I  TM Distance: >3 FB Neck ROM: Full    Dental  (+) Dental Advisory Given   Pulmonary Patient abstained from smoking., former smoker,  12/31/2020 SARS coronavirus NEG '20 Vocal cord cancer: XRT   breath sounds clear to auscultation       Cardiovascular hypertension, Pt. on medications (-) angina Rhythm:Regular Rate:Normal  '14 ECHO: mild LVH, EF 55%, no diagnostic regional wall motion abnormality Grade 1 DD, no significant valvular abnormalities    Neuro/Psych Anxiety Depression Chronic back pain: narcotics    GI/Hepatic Neg liver ROS, GERD  Medicated and Controlled,  Endo/Other  negative endocrine ROS  Renal/GU negative Renal ROS     Musculoskeletal  (+) Arthritis ,   Abdominal   Peds  Hematology negative hematology ROS (+)   Anesthesia Other Findings   Reproductive/Obstetrics                          Anesthesia Physical Anesthesia Plan  ASA: 3  Anesthesia Plan: General   Post-op Pain Management:    Induction: Intravenous  PONV Risk Score and Plan: 2 and Ondansetron, Dexamethasone and Treatment may vary due to age or medical condition  Airway Management Planned: Oral ETT and Video Laryngoscope Planned  Additional Equipment: None  Intra-op Plan:   Post-operative Plan: Extubation in OR  Informed Consent: I have reviewed the patients History and Physical, chart, labs and discussed the procedure including the risks, benefits and alternatives for the proposed anesthesia with the patient or authorized representative who has indicated his/her understanding and acceptance.     Dental advisory  given  Plan Discussed with: CRNA and Surgeon  Anesthesia Plan Comments: (PAT note by Karoline Caldwell, PA-C: Evaluated by cardiology in 2014 for what was ultimately deemed noncardiac chest pain. Echo was normal and nuclear stress test was nonischemic.   Hx of vocal cord cancer. Initially diagnosed in October 2020.This was a T1 lesion treated with radiation therapy. Last seen by Dr. Lucia Gaskins 12/15/20. Fiberoptic laryngoscopy showed vocal cords clear bilaterally with normal vocal mobility. Per note, "History of T1 vocal cord cancer more pronounced on the left true vocal cord that extended to the anterior commissure and right anterior cord. No evidence of recurrent or persistent disease."  Hx of anterior/posterior multilevel cervical fusion C4-7.  Will need DOS labs and eval.  Nuclear stress 02/27/2013: Impression: Normal Lexiscan myoview stress test. No ischemia.  Normal LV systolic function  TTE 02/27/7671: - Left ventricle: The cavity size was normal. Wall thickness  was increased in a pattern of mild LVH. The estimated  ejection fraction was 55%. Although no diagnostic regional  wall motion abnormality was identified, this possibility  cannot be completely excluded on the basis of this study.  Doppler parameters are consistent with abnormal left  ventricular relaxation (grade 1 diastolic dysfunction).  - Aortic valve: There was no stenosis.  - Mitral valve: No significant regurgitation.  - Right ventricle: The cavity size was normal. Systolic  function was normal.  - Pulmonary arteries: No complete TR doppler jet so unable  to estimate PA systolic pressure.  - Inferior vena cava: The vessel was normal in size; the  respirophasic diameter changes were in the normal range (=  50%); findings are consistent with normal central venous  pressure.  Impressions:   - Normal LV size with mild LV hypertrophy, EF 55%. Normal RV  size and systolic function. No significant  valvular  abnormalities. )      Anesthesia Quick Evaluation

## 2020-12-31 NOTE — Progress Notes (Signed)
Anesthesia Chart Review: Same day workup  Evaluated by cardiology in 2014 for what was ultimately deemed noncardiac chest pain. Echo was normal and nuclear stress test was nonischemic.   Hx of vocal cord cancer. Initially diagnosed in October 2020.  This was a T1 lesion treated with radiation therapy. Last seen by Dr. Lucia Gaskins 12/15/20. Fiberoptic laryngoscopy showed vocal cords clear bilaterally with normal vocal mobility. Per note, "History of T1 vocal cord cancer more pronounced on the left true vocal cord that extended to the anterior commissure and right anterior cord. No evidence of recurrent or persistent disease."   Hx of anterior/posterior multilevel cervical fusion C4-7.  Will need DOS labs and eval.  Nuclear stress 02/27/2013: Impression: Normal Lexiscan myoview stress test. No ischemia.  Normal LV systolic function  TTE 12/30/4852: - Left ventricle: The cavity size was normal. Wall thickness    was increased in a pattern of mild LVH. The estimated    ejection fraction was 55%. Although no diagnostic regional    wall motion abnormality was identified, this possibility    cannot be completely excluded on the basis of this study.    Doppler parameters are consistent with abnormal left    ventricular relaxation (grade 1 diastolic dysfunction).  - Aortic valve: There was no stenosis.  - Mitral valve: No significant regurgitation.  - Right ventricle: The cavity size was normal. Systolic    function was normal.  - Pulmonary arteries: No complete TR doppler jet so unable    to estimate PA systolic pressure.  - Inferior vena cava: The vessel was normal in size; the    respirophasic diameter changes were in the normal range (=    50%); findings are consistent with normal central venous    pressure.  Impressions:   - Normal LV size with mild LV hypertrophy, EF 55%. Normal RV    size and systolic function. No significant valvular    abnormalities.    Ramir, [facility]/Anesthesiology Phone [phone] 12/31/2020 3:19 PM

## 2021-01-03 ENCOUNTER — Inpatient Hospital Stay (HOSPITAL_COMMUNITY): Payer: 59

## 2021-01-03 ENCOUNTER — Encounter (HOSPITAL_COMMUNITY): Payer: Self-pay | Admitting: Neurosurgery

## 2021-01-03 ENCOUNTER — Encounter (HOSPITAL_COMMUNITY)
Admission: RE | Disposition: A | Discharge status: Home / Self Care | Payer: Self-pay | Source: Home / Self Care | Attending: Neurosurgery

## 2021-01-03 ENCOUNTER — Inpatient Hospital Stay (HOSPITAL_COMMUNITY)
Admission: RE | Admit: 2021-01-03 | Discharge: 2021-01-05 | DRG: 460 | Disposition: A | Discharge status: Home / Self Care | Payer: 59 | Attending: Neurosurgery | Admitting: Neurosurgery

## 2021-01-03 ENCOUNTER — Inpatient Hospital Stay (HOSPITAL_COMMUNITY): Payer: 59 | Admitting: Physician Assistant

## 2021-01-03 ENCOUNTER — Other Ambulatory Visit: Payer: Self-pay

## 2021-01-03 DIAGNOSIS — M5417 Radiculopathy, lumbosacral region: Secondary | ICD-10-CM

## 2021-01-03 DIAGNOSIS — M25562 Pain in left knee: Secondary | ICD-10-CM | POA: Diagnosis present

## 2021-01-03 DIAGNOSIS — M4120 Other idiopathic scoliosis, site unspecified: Secondary | ICD-10-CM | POA: Diagnosis not present

## 2021-01-03 DIAGNOSIS — G8929 Other chronic pain: Secondary | ICD-10-CM | POA: Diagnosis present

## 2021-01-03 DIAGNOSIS — E785 Hyperlipidemia, unspecified: Secondary | ICD-10-CM | POA: Diagnosis not present

## 2021-01-03 DIAGNOSIS — T84216A Breakdown (mechanical) of internal fixation device of vertebrae, initial encounter: Secondary | ICD-10-CM | POA: Diagnosis not present

## 2021-01-03 DIAGNOSIS — Z888 Allergy status to other drugs, medicaments and biological substances status: Secondary | ICD-10-CM

## 2021-01-03 DIAGNOSIS — K219 Gastro-esophageal reflux disease without esophagitis: Secondary | ICD-10-CM | POA: Diagnosis present

## 2021-01-03 DIAGNOSIS — M545 Low back pain, unspecified: Secondary | ICD-10-CM | POA: Diagnosis not present

## 2021-01-03 DIAGNOSIS — S32009K Unspecified fracture of unspecified lumbar vertebra, subsequent encounter for fracture with nonunion: Secondary | ICD-10-CM | POA: Diagnosis present

## 2021-01-03 DIAGNOSIS — Z885 Allergy status to narcotic agent status: Secondary | ICD-10-CM

## 2021-01-03 DIAGNOSIS — M4326 Fusion of spine, lumbar region: Secondary | ICD-10-CM | POA: Diagnosis not present

## 2021-01-03 DIAGNOSIS — R69 Illness, unspecified: Secondary | ICD-10-CM | POA: Diagnosis not present

## 2021-01-03 DIAGNOSIS — M5416 Radiculopathy, lumbar region: Secondary | ICD-10-CM | POA: Diagnosis not present

## 2021-01-03 DIAGNOSIS — Z20822 Contact with and (suspected) exposure to covid-19: Secondary | ICD-10-CM | POA: Diagnosis present

## 2021-01-03 DIAGNOSIS — F17210 Nicotine dependence, cigarettes, uncomplicated: Secondary | ICD-10-CM | POA: Diagnosis present

## 2021-01-03 DIAGNOSIS — Z85819 Personal history of malignant neoplasm of unspecified site of lip, oral cavity, and pharynx: Secondary | ICD-10-CM

## 2021-01-03 DIAGNOSIS — F32A Depression, unspecified: Secondary | ICD-10-CM | POA: Diagnosis present

## 2021-01-03 DIAGNOSIS — I10 Essential (primary) hypertension: Secondary | ICD-10-CM | POA: Diagnosis present

## 2021-01-03 DIAGNOSIS — Z0389 Encounter for observation for other suspected diseases and conditions ruled out: Secondary | ICD-10-CM | POA: Diagnosis not present

## 2021-01-03 DIAGNOSIS — M109 Gout, unspecified: Secondary | ICD-10-CM | POA: Diagnosis present

## 2021-01-03 DIAGNOSIS — Z79899 Other long term (current) drug therapy: Secondary | ICD-10-CM | POA: Diagnosis not present

## 2021-01-03 DIAGNOSIS — Z981 Arthrodesis status: Secondary | ICD-10-CM | POA: Diagnosis not present

## 2021-01-03 DIAGNOSIS — Y831 Surgical operation with implant of artificial internal device as the cause of abnormal reaction of the patient, or of later complication, without mention of misadventure at the time of the procedure: Secondary | ICD-10-CM | POA: Diagnosis present

## 2021-01-03 DIAGNOSIS — I708 Atherosclerosis of other arteries: Secondary | ICD-10-CM | POA: Diagnosis present

## 2021-01-03 DIAGNOSIS — Z8249 Family history of ischemic heart disease and other diseases of the circulatory system: Secondary | ICD-10-CM

## 2021-01-03 DIAGNOSIS — M960 Pseudarthrosis after fusion or arthrodesis: Principal | ICD-10-CM | POA: Diagnosis present

## 2021-01-03 DIAGNOSIS — T84226A Displacement of internal fixation device of vertebrae, initial encounter: Secondary | ICD-10-CM | POA: Diagnosis not present

## 2021-01-03 DIAGNOSIS — M5127 Other intervertebral disc displacement, lumbosacral region: Secondary | ICD-10-CM

## 2021-01-03 DIAGNOSIS — M5116 Intervertebral disc disorders with radiculopathy, lumbar region: Secondary | ICD-10-CM | POA: Diagnosis not present

## 2021-01-03 DIAGNOSIS — Z419 Encounter for procedure for purposes other than remedying health state, unspecified: Secondary | ICD-10-CM

## 2021-01-03 HISTORY — PX: ANTERIOR LUMBAR FUSION: SHX1170

## 2021-01-03 HISTORY — PX: ABDOMINAL EXPOSURE: SHX5708

## 2021-01-03 LAB — BASIC METABOLIC PANEL
Anion gap: 8 (ref 5–15)
BUN: 11 mg/dL (ref 8–23)
CO2: 23 mmol/L (ref 22–32)
Calcium: 9.3 mg/dL (ref 8.9–10.3)
Chloride: 102 mmol/L (ref 98–111)
Creatinine, Ser: 0.83 mg/dL (ref 0.61–1.24)
GFR, Estimated: >60 mL/min (ref >60)
Glucose, Bld: 93 mg/dL (ref 70–99)
Potassium: 4.2 mmol/L (ref 3.5–5.1)
Sodium: 133 mmol/L — ABNORMAL LOW (ref 135–145)

## 2021-01-03 LAB — POCT I-STAT EG7
Acid-base deficit: 1 mmol/L (ref 0.0–2.0)
Bicarbonate: 26.9 mmol/L (ref 20.0–28.0)
Calcium, Ion: 1.16 mmol/L (ref 1.15–1.40)
HCT: 33 % — ABNORMAL LOW (ref 39.0–52.0)
Hemoglobin: 11.2 g/dL — ABNORMAL LOW (ref 13.0–17.0)
O2 Saturation: 97 %
Patient temperature: 35.7
Potassium: 4.5 mmol/L (ref 3.5–5.1)
Sodium: 133 mmol/L — ABNORMAL LOW (ref 135–145)
TCO2: 29 mmol/L (ref 22–32)
pCO2, Ven: 57.9 mmHg (ref 44.0–60.0)
pH, Ven: 7.268 (ref 7.250–7.430)
pO2, Ven: 99 mmHg — ABNORMAL HIGH (ref 32.0–45.0)

## 2021-01-03 LAB — CBC
HCT: 38.6 % — ABNORMAL LOW (ref 39.0–52.0)
Hemoglobin: 12.8 g/dL — ABNORMAL LOW (ref 13.0–17.0)
MCH: 30.6 pg (ref 26.0–34.0)
MCHC: 33.2 g/dL (ref 30.0–36.0)
MCV: 92.3 fL (ref 80.0–100.0)
Platelets: 241 10*3/uL (ref 150–400)
RBC: 4.18 MIL/uL — ABNORMAL LOW (ref 4.22–5.81)
RDW: 13.5 % (ref 11.5–15.5)
WBC: 6.1 10*3/uL (ref 4.0–10.5)
nRBC: 0 % (ref 0.0–0.2)

## 2021-01-03 LAB — PREPARE RBC (CROSSMATCH)

## 2021-01-03 SURGERY — ANTERIOR LUMBAR FUSION 1 LEVEL
Anesthesia: General

## 2021-01-03 MED ORDER — SODIUM CHLORIDE 0.9% IV SOLUTION: Freq: Once | INTRAVENOUS | Status: DC

## 2021-01-03 MED ORDER — LOSARTAN POTASSIUM 50 MG PO TABS
100.0000 mg | ORAL_TABLET | Freq: Every morning | ORAL | Status: DC
  Administered 2021-01-03 – 2021-01-05 (×3): 100 mg via ORAL
  Filled 2021-01-03 (×3): qty 2

## 2021-01-03 MED ORDER — LIDOCAINE HCL (PF) 2 % IJ SOLN
INTRAMUSCULAR | Status: AC
  Filled 2021-01-03: qty 5

## 2021-01-03 MED ORDER — BUPIVACAINE HCL (PF) 0.5 % IJ SOLN
INTRAMUSCULAR | Status: AC
  Filled 2021-01-03: qty 30

## 2021-01-03 MED ORDER — FLEET ENEMA 7-19 GM/118ML RE ENEM: 1.0000 | ENEMA | Freq: Once | RECTAL | Status: DC | PRN

## 2021-01-03 MED ORDER — SUGAMMADEX SODIUM 200 MG/2ML IV SOLN
INTRAVENOUS | Status: DC | PRN
  Administered 2021-01-03: 200 mg via INTRAVENOUS

## 2021-01-03 MED ORDER — PHENYLEPHRINE HCL-NACL 10-0.9 MG/250ML-% IV SOLN
INTRAVENOUS | Status: DC | PRN
  Administered 2021-01-03: 20 ug/min via INTRAVENOUS

## 2021-01-03 MED ORDER — ZOLPIDEM TARTRATE 5 MG PO TABS: 5.0000 mg | ORAL_TABLET | Freq: Every evening | ORAL | Status: DC | PRN

## 2021-01-03 MED ORDER — POLYETHYLENE GLYCOL 3350 17 G PO PACK: 17.0000 g | PACK | Freq: Every day | ORAL | Status: DC | PRN

## 2021-01-03 MED ORDER — METHOCARBAMOL 1000 MG/10ML IJ SOLN
500.0000 mg | Freq: Four times a day (QID) | INTRAMUSCULAR | Status: DC | PRN
  Filled 2021-01-03 (×3): qty 5

## 2021-01-03 MED ORDER — OXYCODONE HCL 5 MG PO TABS: 5.0000 mg | ORAL_TABLET | Freq: Once | ORAL | Status: DC | PRN

## 2021-01-03 MED ORDER — ACETAMINOPHEN 650 MG RE SUPP: 650.0000 mg | RECTAL | Status: DC | PRN

## 2021-01-03 MED ORDER — SODIUM CHLORIDE 0.9% FLUSH: 3.0000 mL | INTRAVENOUS | Status: DC | PRN

## 2021-01-03 MED ORDER — VITAMIN B-12 1000 MCG PO TABS
1000.0000 ug | ORAL_TABLET | Freq: Every morning | ORAL | Status: DC
  Administered 2021-01-04 – 2021-01-05 (×2): 1000 ug via ORAL
  Filled 2021-01-03 (×2): qty 1

## 2021-01-03 MED ORDER — SERTRALINE HCL 50 MG PO TABS
100.0000 mg | ORAL_TABLET | Freq: Every morning | ORAL | Status: DC
  Administered 2021-01-03 – 2021-01-05 (×3): 100 mg via ORAL
  Filled 2021-01-03 (×4): qty 2

## 2021-01-03 MED ORDER — MEPERIDINE HCL 25 MG/ML IJ SOLN: 6.2500 mg | INTRAMUSCULAR | Status: DC | PRN

## 2021-01-03 MED ORDER — ALUM & MAG HYDROXIDE-SIMETH 200-200-20 MG/5ML PO SUSP: 30.0000 mL | Freq: Four times a day (QID) | ORAL | Status: DC | PRN

## 2021-01-03 MED ORDER — THROMBIN 5000 UNITS EX SOLR
CUTANEOUS | Status: AC
  Filled 2021-01-03: qty 5000

## 2021-01-03 MED ORDER — DEXMEDETOMIDINE (PRECEDEX) IN NS 20 MCG/5ML (4 MCG/ML) IV SYRINGE
PREFILLED_SYRINGE | INTRAVENOUS | Status: DC | PRN
  Administered 2021-01-03 (×2): 8 ug via INTRAVENOUS
  Administered 2021-01-03: 4 ug via INTRAVENOUS

## 2021-01-03 MED ORDER — THROMBIN (RECOMBINANT) 5000 UNITS EX SOLR
CUTANEOUS | Status: AC
  Filled 2021-01-03: qty 5000

## 2021-01-03 MED ORDER — PANTOPRAZOLE SODIUM 40 MG PO TBEC
40.0000 mg | DELAYED_RELEASE_TABLET | Freq: Every day | ORAL | Status: DC
  Administered 2021-01-03 – 2021-01-05 (×2): 40 mg via ORAL
  Filled 2021-01-03 (×2): qty 1

## 2021-01-03 MED ORDER — ROCURONIUM BROMIDE 10 MG/ML (PF) SYRINGE
PREFILLED_SYRINGE | INTRAVENOUS | Status: AC
  Filled 2021-01-03: qty 30

## 2021-01-03 MED ORDER — ALLOPURINOL 100 MG PO TABS
100.0000 mg | ORAL_TABLET | Freq: Every morning | ORAL | Status: DC
  Administered 2021-01-03 – 2021-01-05 (×3): 100 mg via ORAL
  Filled 2021-01-03 (×3): qty 1

## 2021-01-03 MED ORDER — FENTANYL CITRATE (PF) 250 MCG/5ML IJ SOLN
INTRAMUSCULAR | Status: AC
  Filled 2021-01-03: qty 5

## 2021-01-03 MED ORDER — DEXAMETHASONE SODIUM PHOSPHATE 10 MG/ML IJ SOLN
INTRAMUSCULAR | Status: AC
  Filled 2021-01-03: qty 1

## 2021-01-03 MED ORDER — HYDROCODONE-ACETAMINOPHEN 10-325 MG PO TABS
1.0000 | ORAL_TABLET | ORAL | Status: DC | PRN
  Administered 2021-01-03 – 2021-01-05 (×7): 2 via ORAL
  Filled 2021-01-03 (×7): qty 2

## 2021-01-03 MED ORDER — ROCURONIUM BROMIDE 10 MG/ML (PF) SYRINGE
PREFILLED_SYRINGE | INTRAVENOUS | Status: DC | PRN
  Administered 2021-01-03: 40 mg via INTRAVENOUS
  Administered 2021-01-03: 10 mg via INTRAVENOUS
  Administered 2021-01-03: 50 mg via INTRAVENOUS
  Administered 2021-01-03: 10 mg via INTRAVENOUS
  Administered 2021-01-03: 60 mg via INTRAVENOUS
  Administered 2021-01-03 (×2): 30 mg via INTRAVENOUS
  Administered 2021-01-03: 20 mg via INTRAVENOUS
  Administered 2021-01-03: 30 mg via INTRAVENOUS

## 2021-01-03 MED ORDER — MIDAZOLAM HCL 2 MG/2ML IJ SOLN
INTRAMUSCULAR | Status: DC | PRN
  Administered 2021-01-03: 2 mg via INTRAVENOUS

## 2021-01-03 MED ORDER — PROPOFOL 10 MG/ML IV BOLUS
INTRAVENOUS | Status: AC
  Filled 2021-01-03: qty 40

## 2021-01-03 MED ORDER — MIDAZOLAM HCL 2 MG/2ML IJ SOLN
INTRAMUSCULAR | Status: AC
  Filled 2021-01-03: qty 2

## 2021-01-03 MED ORDER — THROMBIN 5000 UNITS EX SOLR
OROMUCOSAL | Status: DC | PRN
  Administered 2021-01-03: 5 mL via TOPICAL

## 2021-01-03 MED ORDER — CHLORHEXIDINE GLUCONATE CLOTH 2 % EX PADS: 6.0000 | MEDICATED_PAD | Freq: Once | CUTANEOUS | Status: DC

## 2021-01-03 MED ORDER — CEFAZOLIN SODIUM-DEXTROSE 2-4 GM/100ML-% IV SOLN
2.0000 g | INTRAVENOUS | Status: AC
  Administered 2021-01-03 (×2): 2 g via INTRAVENOUS
  Filled 2021-01-03: qty 100

## 2021-01-03 MED ORDER — ATORVASTATIN CALCIUM 80 MG PO TABS
80.0000 mg | ORAL_TABLET | Freq: Every morning | ORAL | Status: DC
  Administered 2021-01-03 – 2021-01-05 (×3): 80 mg via ORAL
  Filled 2021-01-03 (×3): qty 1

## 2021-01-03 MED ORDER — MIDAZOLAM HCL 2 MG/2ML IJ SOLN: 0.5000 mg | Freq: Once | INTRAMUSCULAR | Status: DC | PRN

## 2021-01-03 MED ORDER — HYDROCODONE-ACETAMINOPHEN 5-325 MG PO TABS: 1.0000 | ORAL_TABLET | ORAL | Status: DC | PRN

## 2021-01-03 MED ORDER — PANTOPRAZOLE SODIUM 40 MG IV SOLR: 40.0000 mg | Freq: Every day | INTRAVENOUS | Status: DC

## 2021-01-03 MED ORDER — ALBUMIN HUMAN 5 % IV SOLN: INTRAVENOUS | Status: DC | PRN

## 2021-01-03 MED ORDER — DILTIAZEM HCL ER COATED BEADS 180 MG PO CP24
360.0000 mg | ORAL_CAPSULE | Freq: Once | ORAL | Status: AC
  Administered 2021-01-03: 360 mg via ORAL
  Filled 2021-01-03: qty 2

## 2021-01-03 MED ORDER — CEFAZOLIN SODIUM-DEXTROSE 2-4 GM/100ML-% IV SOLN
2.0000 g | Freq: Three times a day (TID) | INTRAVENOUS | Status: AC
  Administered 2021-01-03 – 2021-01-04 (×2): 2 g via INTRAVENOUS
  Filled 2021-01-03 (×2): qty 100

## 2021-01-03 MED ORDER — ACETAMINOPHEN 500 MG PO TABS
1000.0000 mg | ORAL_TABLET | Freq: Once | ORAL | Status: AC
  Filled 2021-01-03: qty 2

## 2021-01-03 MED ORDER — LIDOCAINE HCL (PF) 2 % IJ SOLN
INTRAMUSCULAR | Status: DC | PRN
  Administered 2021-01-03: 20 mg via INTRADERMAL

## 2021-01-03 MED ORDER — COLCHICINE 0.6 MG PO TABS
0.6000 mg | ORAL_TABLET | Freq: Every day | ORAL | Status: DC | PRN
  Filled 2021-01-03: qty 1

## 2021-01-03 MED ORDER — CEFAZOLIN SODIUM 1 G IJ SOLR
INTRAMUSCULAR | Status: AC
  Filled 2021-01-03: qty 20

## 2021-01-03 MED ORDER — EPHEDRINE SULFATE-NACL 50-0.9 MG/10ML-% IV SOSY
PREFILLED_SYRINGE | INTRAVENOUS | Status: DC | PRN
  Administered 2021-01-03: 10 mg via INTRAVENOUS
  Administered 2021-01-03: 5 mg via INTRAVENOUS
  Administered 2021-01-03 (×2): 10 mg via INTRAVENOUS

## 2021-01-03 MED ORDER — METHOCARBAMOL 500 MG PO TABS
500.0000 mg | ORAL_TABLET | Freq: Four times a day (QID) | ORAL | Status: DC | PRN
  Administered 2021-01-03 – 2021-01-04 (×2): 500 mg via ORAL
  Filled 2021-01-03 (×2): qty 1

## 2021-01-03 MED ORDER — OXYCODONE HCL 5 MG/5ML PO SOLN
5.0000 mg | Freq: Once | ORAL | Status: DC | PRN
Start: 2021-01-03 — End: 2021-01-03

## 2021-01-03 MED ORDER — DEXAMETHASONE SODIUM PHOSPHATE 10 MG/ML IJ SOLN
6.0000 mg | Freq: Once | INTRAMUSCULAR | Status: AC
  Administered 2021-01-03: 6 mg via INTRAVENOUS

## 2021-01-03 MED ORDER — SODIUM CHLORIDE 0.9 % IV SOLN: 250.0000 mL | INTRAVENOUS | Status: DC

## 2021-01-03 MED ORDER — ONDANSETRON HCL 4 MG/2ML IJ SOLN
INTRAMUSCULAR | Status: DC | PRN
  Administered 2021-01-03: 4 mg via INTRAVENOUS

## 2021-01-03 MED ORDER — FENTANYL CITRATE (PF) 250 MCG/5ML IJ SOLN
INTRAMUSCULAR | Status: DC | PRN
  Administered 2021-01-03: 250 ug via INTRAVENOUS
  Administered 2021-01-03: 50 ug via INTRAVENOUS
  Administered 2021-01-03: 100 ug via INTRAVENOUS
  Administered 2021-01-03: 50 ug via INTRAVENOUS

## 2021-01-03 MED ORDER — ONDANSETRON HCL 4 MG PO TABS: 4.0000 mg | ORAL_TABLET | Freq: Four times a day (QID) | ORAL | Status: DC | PRN

## 2021-01-03 MED ORDER — KCL IN DEXTROSE-NACL 20-5-0.45 MEQ/L-%-% IV SOLN: INTRAVENOUS | Status: DC

## 2021-01-03 MED ORDER — ONDANSETRON HCL 4 MG/2ML IJ SOLN
INTRAMUSCULAR | Status: AC
  Filled 2021-01-03: qty 2

## 2021-01-03 MED ORDER — BISACODYL 10 MG RE SUPP: 10.0000 mg | Freq: Every day | RECTAL | Status: DC | PRN

## 2021-01-03 MED ORDER — PHENOL 1.4 % MT LIQD: 1.0000 | OROMUCOSAL | Status: DC | PRN

## 2021-01-03 MED ORDER — DILTIAZEM HCL ER COATED BEADS 120 MG PO CP24
360.0000 mg | ORAL_CAPSULE | Freq: Every morning | ORAL | Status: DC
  Administered 2021-01-04 – 2021-01-05 (×2): 360 mg via ORAL
  Filled 2021-01-03 (×2): qty 3

## 2021-01-03 MED ORDER — DEXAMETHASONE SODIUM PHOSPHATE 4 MG/ML IJ SOLN
4.0000 mg | Freq: Four times a day (QID) | INTRAMUSCULAR | Status: AC
  Administered 2021-01-03 – 2021-01-04 (×4): 4 mg via INTRAVENOUS
  Filled 2021-01-03 (×4): qty 1

## 2021-01-03 MED ORDER — PROPOFOL 10 MG/ML IV BOLUS
INTRAVENOUS | Status: DC | PRN
  Administered 2021-01-03: 50 mg via INTRAVENOUS
  Administered 2021-01-03: 150 mg via INTRAVENOUS

## 2021-01-03 MED ORDER — ONDANSETRON HCL 4 MG/2ML IJ SOLN: 4.0000 mg | Freq: Four times a day (QID) | INTRAMUSCULAR | Status: DC | PRN

## 2021-01-03 MED ORDER — DEXAMETHASONE SODIUM PHOSPHATE 10 MG/ML IJ SOLN
INTRAMUSCULAR | Status: DC | PRN
  Administered 2021-01-03: 5 mg via INTRAVENOUS

## 2021-01-03 MED ORDER — HYDROMORPHONE HCL 1 MG/ML IJ SOLN
0.5000 mg | INTRAMUSCULAR | Status: DC | PRN
  Administered 2021-01-03: 0.5 mg via INTRAVENOUS
  Filled 2021-01-03: qty 0.5

## 2021-01-03 MED ORDER — HYDROMORPHONE HCL 1 MG/ML IJ SOLN
0.2500 mg | INTRAMUSCULAR | Status: DC | PRN
  Administered 2021-01-03 (×3): 0.5 mg via INTRAVENOUS

## 2021-01-03 MED ORDER — 0.9 % SODIUM CHLORIDE (POUR BTL) OPTIME
TOPICAL | Status: DC | PRN
  Administered 2021-01-03: 1000 mL

## 2021-01-03 MED ORDER — HYDROMORPHONE HCL 1 MG/ML IJ SOLN
INTRAMUSCULAR | Status: AC
  Filled 2021-01-03: qty 1

## 2021-01-03 MED ORDER — DOCUSATE SODIUM 100 MG PO CAPS
100.0000 mg | ORAL_CAPSULE | Freq: Two times a day (BID) | ORAL | Status: DC
  Administered 2021-01-03 – 2021-01-05 (×3): 100 mg via ORAL
  Filled 2021-01-03 (×3): qty 1

## 2021-01-03 MED ORDER — ORAL CARE MOUTH RINSE: 15.0000 mL | Freq: Once | OROMUCOSAL | Status: AC

## 2021-01-03 MED ORDER — LIDOCAINE-EPINEPHRINE 1 %-1:100000 IJ SOLN
INTRAMUSCULAR | Status: AC
  Filled 2021-01-03: qty 1

## 2021-01-03 MED ORDER — LACTATED RINGERS IV SOLN: INTRAVENOUS | Status: DC | PRN

## 2021-01-03 MED ORDER — PHENYLEPHRINE 40 MCG/ML (10ML) SYRINGE FOR IV PUSH (FOR BLOOD PRESSURE SUPPORT)
PREFILLED_SYRINGE | INTRAVENOUS | Status: AC
  Filled 2021-01-03: qty 20

## 2021-01-03 MED ORDER — LACTATED RINGERS IV SOLN: INTRAVENOUS | Status: DC

## 2021-01-03 MED ORDER — KETOROLAC TROMETHAMINE 15 MG/ML IJ SOLN
7.5000 mg | Freq: Four times a day (QID) | INTRAMUSCULAR | Status: AC
  Administered 2021-01-03 – 2021-01-04 (×4): 7.5 mg via INTRAVENOUS
  Filled 2021-01-03 (×4): qty 1

## 2021-01-03 MED ORDER — ACETAMINOPHEN 325 MG PO TABS: 650.0000 mg | ORAL_TABLET | ORAL | Status: DC | PRN

## 2021-01-03 MED ORDER — ACETAMINOPHEN 500 MG PO TABS
ORAL_TABLET | ORAL | Status: AC
  Administered 2021-01-03: 1000 mg via ORAL
  Filled 2021-01-03: qty 2

## 2021-01-03 MED ORDER — CYCLOBENZAPRINE HCL 10 MG PO TABS
10.0000 mg | ORAL_TABLET | Freq: Three times a day (TID) | ORAL | Status: DC | PRN
  Administered 2021-01-05: 10 mg via ORAL
  Filled 2021-01-03 (×2): qty 1

## 2021-01-03 MED ORDER — EPHEDRINE 5 MG/ML INJ
INTRAVENOUS | Status: AC
  Filled 2021-01-03: qty 10

## 2021-01-03 MED ORDER — MENTHOL 3 MG MT LOZG: 1.0000 | LOZENGE | OROMUCOSAL | Status: DC | PRN

## 2021-01-03 MED ORDER — PROMETHAZINE HCL 25 MG/ML IJ SOLN: 6.2500 mg | INTRAMUSCULAR | Status: DC | PRN

## 2021-01-03 MED ORDER — CHLORHEXIDINE GLUCONATE 0.12 % MT SOLN
15.0000 mL | Freq: Once | OROMUCOSAL | Status: AC
  Administered 2021-01-03: 15 mL via OROMUCOSAL
  Filled 2021-01-03: qty 15

## 2021-01-03 MED ORDER — SODIUM CHLORIDE 0.9% FLUSH
3.0000 mL | Freq: Two times a day (BID) | INTRAVENOUS | Status: DC
  Administered 2021-01-03 – 2021-01-04 (×3): 3 mL via INTRAVENOUS

## 2021-01-03 SURGICAL SUPPLY — 93 items
ADH SKN CLS APL DERMABOND .7 (GAUZE/BANDAGES/DRESSINGS) ×2
APPLIER CLIP 11 MED OPEN (CLIP) ×4
APR CLP MED 11 20 MLT OPN (CLIP) ×2
BASKET BONE COLLECTION (BASKET) IMPLANT
BIT DRILL NEURO 2X3.1 SFT TUCH (MISCELLANEOUS) IMPLANT
BOLT ALIF MODULUS 5X17.5 (Bolt) ×1 IMPLANT
BOLT ALIF MODULUS 5X20 (Bolt) ×2 IMPLANT
BOLT ALIF MODULUS 5X22.5 (Bolt) ×2 IMPLANT
BOLT ALIF MODULUS 6X15 (Bolt) ×1 IMPLANT
BUR BARREL STRAIGHT FLUTE 4.0 (BURR) ×2 IMPLANT
CANISTER SUCT 3000ML PPV (MISCELLANEOUS) ×2 IMPLANT
CLIP APPLIE 11 MED OPEN (CLIP) ×2 IMPLANT
CLIP LIGATING EXTRA MED SLVR (CLIP) ×2 IMPLANT
CLIP LIGATING EXTRA SM BLUE (MISCELLANEOUS) ×2 IMPLANT
COVER BACK TABLE 60X90IN (DRAPES) ×2 IMPLANT
COVER TRANSDUCER ULTRASND 9X24 (MISCELLANEOUS) ×2 IMPLANT
COVER WAND RF STERILE (DRAPES) ×2 IMPLANT
DECANTER SPIKE VIAL GLASS SM (MISCELLANEOUS) ×2 IMPLANT
DERMABOND ADVANCED (GAUZE/BANDAGES/DRESSINGS) ×2
DERMABOND ADVANCED .7 DNX12 (GAUZE/BANDAGES/DRESSINGS) ×2 IMPLANT
DRAPE C-ARM 42X72 X-RAY (DRAPES) ×2 IMPLANT
DRAPE C-ARMOR (DRAPES) ×2 IMPLANT
DRAPE LAPAROTOMY 100X72X124 (DRAPES) ×2 IMPLANT
DRILL NEURO 2X3.1 SOFT TOUCH (MISCELLANEOUS) ×4
DRSG OPSITE POSTOP 4X8 (GAUZE/BANDAGES/DRESSINGS) ×1 IMPLANT
DURAPREP 26ML APPLICATOR (WOUND CARE) ×2 IMPLANT
ELECT BLADE 4.0 EZ CLEAN MEGAD (MISCELLANEOUS) ×2
ELECT REM PT RETURN 9FT ADLT (ELECTROSURGICAL) ×2
ELECTRODE BLDE 4.0 EZ CLN MEGD (MISCELLANEOUS) ×1 IMPLANT
ELECTRODE REM PT RTRN 9FT ADLT (ELECTROSURGICAL) ×1 IMPLANT
GAUZE 4X4 16PLY RFD (DISPOSABLE) IMPLANT
GAUZE SPONGE 4X4 12PLY STRL (GAUZE/BANDAGES/DRESSINGS) IMPLANT
GLOVE BIO SURGEON STRL SZ7.5 (GLOVE) ×2 IMPLANT
GLOVE BIO SURGEON STRL SZ8 (GLOVE) ×2 IMPLANT
GLOVE BIOGEL PI IND STRL 8.5 (GLOVE) ×1 IMPLANT
GLOVE BIOGEL PI INDICATOR 8.5 (GLOVE) ×1
GLOVE ECLIPSE 8.0 STRL XLNG CF (GLOVE) ×4 IMPLANT
GLOVE EXAM NITRILE XL STR (GLOVE) IMPLANT
GLOVE SRG 8 PF TXTR STRL LF DI (GLOVE) ×3 IMPLANT
GLOVE SS BIOGEL STRL SZ 7.5 (GLOVE) ×1 IMPLANT
GLOVE SUPERSENSE BIOGEL SZ 7.5 (GLOVE) ×1
GLOVE SURG UNDER POLY LF SZ8 (GLOVE) ×6
GOWN STRL REUS W/ TWL LRG LVL3 (GOWN DISPOSABLE) IMPLANT
GOWN STRL REUS W/ TWL XL LVL3 (GOWN DISPOSABLE) ×2 IMPLANT
GOWN STRL REUS W/TWL 2XL LVL3 (GOWN DISPOSABLE) ×4 IMPLANT
GOWN STRL REUS W/TWL LRG LVL3 (GOWN DISPOSABLE)
GOWN STRL REUS W/TWL XL LVL3 (GOWN DISPOSABLE) ×4
HEMOSTAT POWDER KIT SURGIFOAM (HEMOSTASIS) ×1 IMPLANT
HEMOSTAT POWDER SURGIFOAM 1G (HEMOSTASIS) ×1 IMPLANT
HEMOSTAT SNOW SURGICEL 2X4 (HEMOSTASIS) IMPLANT
INSERT FOGARTY 61MM (MISCELLANEOUS) IMPLANT
INSERT FOGARTY SM (MISCELLANEOUS) IMPLANT
KIT BASIN OR (CUSTOM PROCEDURE TRAY) ×2 IMPLANT
KIT INFUSE XX SMALL 0.7CC (Orthopedic Implant) ×1 IMPLANT
KIT TURNOVER KIT B (KITS) ×2 IMPLANT
LOOP VESSEL MAXI BLUE (MISCELLANEOUS) IMPLANT
LOOP VESSEL MINI RED (MISCELLANEOUS) IMPLANT
NDL HYPO 25X1 1.5 SAFETY (NEEDLE) IMPLANT
NDL SPNL 18GX3.5 QUINCKE PK (NEEDLE) ×1 IMPLANT
NEEDLE HYPO 25X1 1.5 SAFETY (NEEDLE) ×2 IMPLANT
NEEDLE SPNL 18GX3.5 QUINCKE PK (NEEDLE) ×2 IMPLANT
NS IRRIG 1000ML POUR BTL (IV SOLUTION) ×2 IMPLANT
PACK LAMINECTOMY NEURO (CUSTOM PROCEDURE TRAY) ×2 IMPLANT
PAD ARMBOARD 7.5X6 YLW CONV (MISCELLANEOUS) ×4 IMPLANT
PUTTY BONE ATTRAX 10CC STRIP (Putty) ×1 IMPLANT
RASP 3.0MM (RASP) ×1 IMPLANT
SPACER ALIF MOD 6X42X32 10D (Spacer) ×1 IMPLANT
SPONGE INTESTINAL PEANUT (DISPOSABLE) ×12 IMPLANT
SPONGE LAP 18X18 RF (DISPOSABLE) ×3 IMPLANT
SPONGE LAP 4X18 RFD (DISPOSABLE) IMPLANT
SPONGE SURGIFOAM ABS GEL SZ50 (HEMOSTASIS) ×1 IMPLANT
STAPLER VISISTAT 35W (STAPLE) IMPLANT
SUT PDS AB 1 CTX 36 (SUTURE) ×3 IMPLANT
SUT PROLENE 4 0 RB 1 (SUTURE) ×4
SUT PROLENE 4-0 RB1 .5 CRCL 36 (SUTURE) IMPLANT
SUT PROLENE 5 0 CC1 (SUTURE) ×1 IMPLANT
SUT PROLENE 6 0 C 1 30 (SUTURE) ×2 IMPLANT
SUT PROLENE 6 0 CC (SUTURE) IMPLANT
SUT SILK 0 TIES 10X30 (SUTURE) ×2 IMPLANT
SUT SILK 2 0 TIES 10X30 (SUTURE) ×3 IMPLANT
SUT SILK 2 0 TIES 17X18 (SUTURE) ×2
SUT SILK 2 0SH CR/8 30 (SUTURE) IMPLANT
SUT SILK 2-0 18XBRD TIE BLK (SUTURE) ×1 IMPLANT
SUT SILK 3 0 TIES 17X18 (SUTURE) ×2
SUT SILK 3 0SH CR/8 30 (SUTURE) IMPLANT
SUT SILK 3-0 18XBRD TIE BLK (SUTURE) ×1 IMPLANT
SUT VIC AB 2-0 CT1 18 (SUTURE) ×2 IMPLANT
SUT VIC AB 3-0 SH 8-18 (SUTURE) ×3 IMPLANT
SUT VICRYL 4-0 PS2 18IN ABS (SUTURE) IMPLANT
TOWEL GREEN STERILE (TOWEL DISPOSABLE) ×4 IMPLANT
TOWEL GREEN STERILE FF (TOWEL DISPOSABLE) ×2 IMPLANT
TRAY FOLEY MTR SLVR 16FR STAT (SET/KITS/TRAYS/PACK) ×2 IMPLANT
WATER STERILE IRR 1000ML POUR (IV SOLUTION) ×2 IMPLANT

## 2021-01-03 NOTE — Op Note (Signed)
Date: January 03, 2021  Preoperative diagnosis: Chronic lower back pain with left leg radiculopathy  Postoperative diagnosis: Same  Procedure: Anterior spine exposure at the L4-L5 disc space via anterior retroperitoneal approach for L4-L5 ALIF  Surgeon: Dr. [name], MD  Co-surgeon: Dr. Erline Levine, MD  Assistant: Verdis Prime  Indications: Patient is a 65 year old male with chronic lower back pain after previous L1-L5 posterior fusion.  He has worsening back pain with left leg radiculopathy and Dr. Vertell Limber feels he had migration of the cage at the L4-L5 disc space after a fall earlier this year.  He feels he has no other options other than anterior approach.  I discussed my concern given his significant aortoiliac atherosclerosis.  He presents after risk benefits discussed for L4-L5 ALIF.  Findings: Paramedian incision over the left rectus where the L4-L5 disc space was marked.  The left rectus muscle was mobilized medially and I entered the retroperitoneum and mobilized the peritoneum and left ureter to the midline.  I ultimately ligated about 5 different iliolumbar branches as well as a segmental branch in order to fully mobilized the left iliac artery and vein to the midline.  There was significant scarring from his previous spine surgery the left iliac vein mobilization was extremely difficult.  Case was turned over to Dr. Vertell Limber after we placed fixed retractor and confirmed we were at the correct level on lateral fluoscopy.  Anesthesia: General  Details: Patient was taken to the operating room after informed consent was obtained.  Taken to the operating room and placed in the supine position and general endotracheal anesthesia was induced.  Fluoroscopic C-arm was brought in the lateral position and we marked the L4-L5 disc space on the anterior abdominal wall over the left rectus muscle.  The abdominal wall was then prepped and draped in usual sterile fashion.  Timeout was performed.   Antibiotics were given.  Initially made the longitudinal incision over the left rectus muscle where the disc space was marked.  Opened the subcutaneous tissue with Bovie cautery and cerebellar retractors were used for better visualization.  The anterior rectus sheath was opened longitudinally with Bovie cautery.  I then used hemostats to raise flaps underneath the anterior rectus sheath.  I then entered lateral to the muscle and the peritoneum was mobilized to the midline and ultimately I mobilized the peritoneum off the posterior rectus sheath above arcuate line and the posterior sheath was taken down with Metzenbaum scissors.  Ultimately at that point in time my assistant used hand-held Wiley retractors to pull the peritoneum and left ureter to the midline.  I entered lateral to the iliac vessels that was moderately calcified and then the left iliac artery was mobilized to the midline and we visualized the left iliac vein.  We encountered at least 4-5 iliolumbar branches that were then ligated between 2-0 silk ties and divided.  I did also ligated a segmental branch between 2-0 silk ties and divided.  In the process of mobilizing we did lose one of the ties from an iliolumbar branch off the left iliac vein and this had to be repaired with a 5-0 Prolene figure-of-eight suture with good hemostasis.  Ultimately I continued to bluntly mobilize the iliac artery and vein to the midline where we could not visualize the L4-L5 disc space.  There was a large osteophyte as well as significant inflammation and scarring from previous spinal surgery that made mobilization extremely tedious and difficult.  Finally once we had an area working  space fixed retractor was placed on the field with blades cranial caudal and on each side of the disc space.  We confirmed with a spinal needle on lateral fluoroscopy that we were at the correct level.  Case was turned over Dr. Vertell Limber.   I was called back into the room to evaluate some  venous bleeding.  Ultimately the inferior retractor holding the iliac vein was removed.  I did not see any bleeding from the left iliac vein itself.  There appeared to be a clip that had come off from one of the iliolumbar branches on the psoas muscle side.  This was ultimately controlled with an additional vessel clip with good hemostasis.  I then turned the case back over to Dr. Vertell Limber.  I was called back again and asked to move several retractors.  First I moved the cranial retractor to allow screw placement after the interbody implant was placed.  I then removed the caudal retractor blade holding the iliac vein to allow additional screw placement.  After the final xray was taken, I did remove the retractor on the right side of the disc space.  The left iliac artery had a palpable pulse.  I scrubbed out.  I checked both feet and palpable DP pulses bilaterally.  Case was turned over to Dr. Vertell Limber for closure.  Complication: None  Condition: Stable  [name], MD [facility] Office: [place]

## 2021-01-03 NOTE — Anesthesia Procedure Notes (Signed)
Procedure Name: Intubation Date/Time: 01/03/2021 11:40 AM Performed by: Donnelly Angelica, RN Pre-anesthesia Checklist: Patient identified, Emergency Drugs available, Suction available and Patient being monitored Patient Re-evaluated:Patient Re-evaluated prior to induction Oxygen Delivery Method: Circle system utilized Preoxygenation: Pre-oxygenation with 100% oxygen Induction Type: IV induction Ventilation: Mask ventilation without difficulty Laryngoscope Size: Glidescope and 3 Grade View: Grade I Tube type: Oral Tube size: 7.0 mm Number of attempts: 1 Airway Equipment and Method: Stylet and Rigid stylet Placement Confirmation: ETT inserted through vocal cords under direct vision, positive ETCO2 and breath sounds checked- equal and bilateral Secured at: 23 cm Tube secured with: Tape Dental Injury: Teeth and Oropharynx as per pre-operative assessment

## 2021-01-03 NOTE — Brief Op Note (Signed)
01/03/2021  5:49 PM  PATIENT:  Aaron Black  65 y.o. male  PRE-OPERATIVE DIAGNOSIS:  Pseudoarthrosis of lumbar spine with lumbar radiculopathy after cage migration after a fall  POST-OPERATIVE DIAGNOSIS:  Pseudoarthrosis of lumbar spine with lumbar radiculopathy after cage migration after a fall  PROCEDURE:  Procedure(s) with comments: Lumbar Four-Five Anterior lumbar interbody fusion with revision of graft (N/A) - Lumbar Four-Five Anterior lumbar interbody fusion with revision of graft (removal of XLIF cage, foraminaotomy and placement of ALIF cage with interfixated bolts. ABDOMINAL EXPOSURE (N/A)  SURGEON:  Surgeon(s) and Role: Panel 1:    * Erline Levine, MD - Primary    Kristeen Miss, MD - Assisting Panel 2:    [name], MD - Primary  PHYSICIAN ASSISTANT:   ASSISTANTS: Poteat, RN   ANESTHESIA:   general  EBL:  900 mL   BLOOD ADMINISTERED: 150 CC PRBC  DRAINS: none   LOCAL MEDICATIONS USED:  MARCAINE    and LIDOCAINE   SPECIMEN:  No Specimen  DISPOSITION OF SPECIMEN:  N/A  COUNTS:  YES  TOURNIQUET:  * No tourniquets in log *  INDICATIONS:  Pateint is 65 year old male with chronic and intractable back and left lower extremity pain after a fall on the ice, which resulted in XLIF cage migration at L 45 with underlying pseudoarthrosis.  It was elected to take him to surgery for anterior lumbar decompression and fusion at the L 45 level with removal of previously placed XLIF cage and replacement with ALIF cage after decompressing neural elements.  PROCEDURE:  Doctor Carlis Abbott performed exposure and his portion of the procedure will be dictated separately.  Upon exposing the L 45 level, a localizing X ray was obtained with the C arm.  I then incised the anterior annulus and performed a thorough discectomy, carefully removing anterior osteophyes and exposing the cage.  Using chisels and high speed drill, the cage was exposed, broken apart and removed piecemeal.   The interspace was widely decompressed, including laterally on the left overlying the neural foramen.  The endplates were cleared of disc and cartilagenous material and a thorough discectomy was performed with decompression of the ventral annulus and disc material.  After trial, a  6 x 42 x 32 mm x 10 degree titanium spacer was selected, packed with extra extra small BMP and Attrax bone graft extender.  The implant was tamped into position and positioning was confirmed with C arm.  Interfixated bolts were placed (5.0 x 22.5 one in L 5 and one in L 4 with an additional 5.0 x 20 mm bolt in L 4.  Additional Attrax was placed over the implant and tamped into position.   Locking mechanisms were engaged, soft tissues were inspected and found to be in good repair.  Retractors were removed.  Fascia was closed with 1 PDS running stitch, skin edges closed with 2-0 and 3-0 vicryl sutures.  Wound was dressed with Dermabond and a sterile occlusive dressing.  Patient was extubated in the OR and taken to recovery having tolerated her surgery well.  Counts were correct.   PLAN OF CARE: Admit to inpatient   PATIENT DISPOSITION:  PACU - hemodynamically stable.   Delay start of Pharmacological VTE agent (>24hrs) due to surgical blood loss or risk of bleeding: yes

## 2021-01-03 NOTE — Op Note (Signed)
01/03/2021  5:49 PM  PATIENT:  Aaron Black  65 y.o. male  PRE-OPERATIVE DIAGNOSIS:  Pseudoarthrosis of lumbar spine with lumbar radiculopathy after cage migration after a fall  POST-OPERATIVE DIAGNOSIS:  Pseudoarthrosis of lumbar spine with lumbar radiculopathy after cage migration after a fall  PROCEDURE:  Procedure(s) with comments: Lumbar Four-Five Anterior lumbar interbody fusion with revision of graft (N/A) - Lumbar Four-Five Anterior lumbar interbody fusion with revision of graft (removal of XLIF cage, foraminaotomy and placement of ALIF cage with interfixated bolts. ABDOMINAL EXPOSURE (N/A)  SURGEON:  Surgeon(s) and Role: Panel 1:    * Erline Levine, MD - Primary    Kristeen Miss, MD - Assisting Panel 2:    [name], MD - Primary  PHYSICIAN ASSISTANT:   ASSISTANTS: Poteat, RN   ANESTHESIA:   general  EBL:  900 mL   BLOOD ADMINISTERED:150 CC PRBC  DRAINS: none   LOCAL MEDICATIONS USED:  MARCAINE    and LIDOCAINE   SPECIMEN:  No Specimen  DISPOSITION OF SPECIMEN:  N/A  COUNTS:  YES  TOURNIQUET:  * No tourniquets in log *  INDICATIONS:  Pateint is 65 year old male with chronic and intractable back and left lower extremity pain after a fall on the ice, which resulted in XLIF cage migration at L 45 with underlying pseudoarthrosis.  It was elected to take him to surgery for anterior lumbar decompression and fusion at the L 45 level with removal of previously placed XLIF cage and replacement with ALIF cage after decompressing neural elements.  PROCEDURE:  Doctor Carlis Abbott performed exposure and his portion of the procedure will be dictated separately.  Upon exposing the L 45 level, a localizing X ray was obtained with the C arm.  I then incised the anterior annulus and performed a thorough discectomy, carefully removing anterior osteophyes and exposing the cage.  Using chisels and high speed drill, the cage was exposed, broken apart and removed piecemeal.   The interspace was widely decompressed, including laterally on the left overlying the neural foramen.  The endplates were cleared of disc and cartilagenous material and a thorough discectomy was performed with decompression of the ventral annulus and disc material.  After trial, a  6 x 42 x 32 mm x 10 degree titanium spacer was selected, packed with extra extra small BMP and Attrax bone graft extender.  The implant was tamped into position and positioning was confirmed with C arm.  Interfixated bolts were placed (5.0 x 22.5 one in L 5 and one in L 4 with an additional 5.0 x 20 mm bolt in L 4.  Additional Attrax was placed over the implant and tamped into position.   Locking mechanisms were engaged, soft tissues were inspected and found to be in good repair.  Retractors were removed.  Fascia was closed with 1 PDS running stitch, skin edges closed with 2-0 and 3-0 vicryl sutures.  Wound was dressed with Dermabond and a sterile occlusive dressing.  Patient was extubated in the OR and taken to recovery having tolerated her surgery well.  Counts were correct.   PLAN OF CARE: Admit to inpatient   PATIENT DISPOSITION:  PACU - hemodynamically stable.   Delay start of Pharmacological VTE agent (>24hrs) due to surgical blood loss or risk of bleeding: yes

## 2021-01-03 NOTE — Progress Notes (Signed)
Patient is awake, alert, conversant.  He is having pain in his left leg behind his kneecap, but appears to have full strength and symmetric reflexes and intact sensation.  Will observe and give short course of steroids for nerve irritation.

## 2021-01-03 NOTE — Interval H&P Note (Signed)
History and Physical Interval Note:  01/03/2021 11:16 AM  [name]  has presented today for surgery, with the diagnosis of Pseudoarthrosis of lumbar spine.  The various methods of treatment have been discussed with the patient and family. After consideration of risks, benefits and other options for treatment, the patient has consented to  Procedure(s) with comments: Lumbar 4-5 Anterior lumbar interbody fusion with revision of graft (N/A) - RM 19 ABDOMINAL EXPOSURE (N/A) as a surgical intervention.  The patient's history has been reviewed, patient examined, no change in status, stable for surgery.  I have reviewed the patient's chart and labs.  Questions were answered to the patient's satisfaction.     Aaron Black

## 2021-01-03 NOTE — H&P (Signed)
Patient ID:   [id number] Patient: Aaron Black  Date of Birth: [birth year] Visit Type: Office Visit   Date: 12/15/2020 11:00 AM Provider: Marchia Meiers. Vertell Limber MD   This 65 year old male presents for back pain.  HISTORY OF PRESENT ILLNESS: 1.  back pain  Patient returns to review his CT  The patient is continuing to complain of 10/10 pain with left leg pain particularly when he stands.  I believe this relates to his pseudoarthrosis at the L4-5 level with migration of his cage into the extraforaminal space on the left causing some left L4 nerve root compression.  The remaining levels of his lumbar spine appear satisfactory.  I have recommended proceeding with anterior lumbar interbody fusion at the L4-5 level with revision of the previous Leigh placed XL IF graft and performing anterior lumbar interbody fusion at the L4-5 level.  I have reviewed the case with Dr. Carlis Abbott and he will review it with Dr. Donnetta Hutching.      Medical/Surgical/Interim History Reviewed, no change.  Last detailed document date:11/01/2020.     PAST MEDICAL HISTORY, SURGICAL HISTORY, FAMILY HISTORY, SOCIAL HISTORY AND REVIEW OF SYSTEMS I have reviewed the patient's past medical, surgical, family and social history as well as the comprehensive review of systems as included on the [facility] history form dated 11/01/2020, which I have signed.  Family History: Reviewed, no changes.  Last detailed document date:11/01/2020.   Social History: Reviewed, no changes. Last detailed document date: 11/01/2020.    MEDICATIONS: (added, continued or stopped this visit) Started Medication Directions Instruction Stopped  allopurinol 100 mg tablet take 1 tablet by oral route  every day    colchicine 0.6 mg capsule take 1 capsule by oral route  every day    diltiazem ER 360 mg capsule,24 hr,extended release take 1 capsule by oral route  every day    Flexeril 10mg   ORAL     hydrocodone 5 mg-acetaminophen 325 mg tablet     losartan 100 mg tablet take 1 tablet by oral route  every day    omeprazole 20 mg capsule,delayed release take 1 capsule by oral route  every day 30 minutes to 1 hour before a meal    sertraline 50 mg tablet take 1 tablet by oral route  every day    vitamin b12       ALLERGIES: Ingredient Reaction Medication Name Comment OXYCODONE TEREPHTHALATE Rash Percodan  ASPIRIN Rash Percodan  OXYCODONE HCL Rash Percodan  LISINOPRIL     Reviewed, no changes.    PHYSICAL EXAM:  Vitals Date Temp F BP Pulse Ht In Wt Lb BMI BSA Pain Score 12/15/2020  157/79 82 67 175 27.41  10/10     IMPRESSION:  Continued severe left leg pain related to pseudoarthrosis at L4-5 with posterolateral migration of the left L4-5 cage.  PLAN: Proceed with anterior lumbar decompression and fusion at the L4-5 level.  Risks and benefits were discussed in detail with the patient he wishes to proceed with surgery.  This will be done in conjunction with the vascular surgery service.  Orders: Diagnostic Procedures: Assessment Procedure M54.16 Lumbar Spine- AP/Lat Instruction(s)/Education: Assessment Instruction  Tobacco cessation counseling I10 Lifestyle education Z68.27 Dietary management education, guidance, and counseling Miscellaneous: Assessment  M54.16 LSO Brace  Completed Orders (this encounter) Order Details Reason Side Interpretation Result Initial Treatment Date Region Tobacco cessation counseling        Lifestyle education Patient will follow up with Primary Care Physician.  Dietary management education, guidance, and counseling Encouraged patient to eat well balanced diet.        Assessment/Plan  # Detail Type Description  1. Assessment Scoliosis (and kyphoscoliosis), idiopathic (M41.20).     2. Assessment Radiculopathy, lumbar  region (M54.16).  Plan Orders LSO Brace.     3. Assessment Postsurgical arthrodesis status (Z98.1).     4. Assessment Pseudoarthrosis of lumbar spine (S32.009K).     5. Assessment Essential (primary) hypertension (I10).     6. Assessment Body mass index (BMI) 27.0-27.9, adult (J33.54).  Plan Orders Today's instructions / counseling include(s) Dietary management education, guidance, and counseling. Clinical information/comments: Encouraged patient to eat well balanced diet.       Pain Management Plan Pain Scale: 10/10. Method: Numeric Pain Intensity Scale. Location: back. Onset: 11/21/2013. Duration: varies. Quality: discomforting. Pain management follow-up plan of care: Patient will continue medication management..              Provider:  Marchia Meiers. Vertell Limber MD  12/19/2020 05:01 PM    Dictation edited by: Marchia Meiers. Vertell Limber    CC Providers: Dorothyann Peng [place],  [place]  [phone]   [facility] [address] [place], [place] [id number]-               Electronically signed by Marchia Meiers. Vertell Limber MD on 12/19/2020 05:01 PM

## 2021-01-03 NOTE — H&P (Signed)
History and Physical Interval Note:  01/03/2021 11:26 AM  Aaron Black  has presented today for surgery, with the diagnosis of Pseudoarthrosis of lumbar spine.  The various methods of treatment have been discussed with the patient and family. After consideration of risks, benefits and other options for treatment, the patient has consented to  Procedure(s) with comments: Lumbar 4-5 Anterior lumbar interbody fusion with revision of graft (N/A) - RM 19 ABDOMINAL EXPOSURE (N/A) as a surgical intervention.  The patient's history has been reviewed, patient examined, no change in status, stable for surgery.  I have reviewed the patient's chart and labs.  Questions were answered to the patient's satisfaction.    L4-L5 ALIF.  I have discussed my concern given his degree of aortoiliac calcification.  Discussed this is much higher risk and if he could live with his pain may be reasonable to pursue medical management.  He does not feel he can do that and understands he is taking added risk.  I discussed there is some risk that we get in there and do not feel it safe to proceed pending how much mobility we can get with the iliac vessels.  [name]  Patient name: Aaron Black           MRN: [id number]        DOB: 1956/02/07            Sex: male   REASON FOR CONSULT: Evaluate for L4-L5 ALIF   HPI: Aaron Black is a 65 y.o. male, with multiple medical problems including chronic back pain, hypertension, hyperlipidemia that presents for preop evaluation of possible L4-L5 ALIF.  Patient has a complex history of spine surgery and initially underwent a microdiscectomy 2014 at L3-L4.  Later in 2015 he had anterior lateral lumbar fusion from L1-L5 with pedicle screws.  Most recently had revision of the posterior hardware in 2017.  Sound like he was doing well until earlier this year in January when he fell on the ice.  He now has severe radiculopathy in the left leg that keeps him awake at night and is on  chronic narcotics.  Has been evaluated by Dr. Vertell Limber and given multiple previous posterior instrumentation he feels he is a candidate for only an anterior approach.  He denies any previous abdominal surgery.       Past Medical History:  Diagnosis Date   Anemia     Anxiety     Arm pain     Arthritis     B12 deficiency     Back pain     Cancer (HCC)      throat cancer   Cellulitis of arm, left 10/2017    after cat scratch   Cervical disc disease      a. 07/2003 ant cervical deomcpression and fusion C5-6/C6-7;  b. 09/2007 post cervical laminectomy C4-5 with scres and arthrodesis C4-C7.   Chest pain at rest 02/25/2013   Chronic lower back pain     Complication of anesthesia      " i WOKE UP DURING A COLONOSCOPY "- 2008   Depression     Diverticulosis of colon     GERD (gastroesophageal reflux disease)     Gout     Hyperlipidemia     Hypertension     Joint pain     Leg pain      While walking   Leg weakness     Neck pain     Pleurisy  Shortness of breath dyspnea      with exertion- "I cant exercise now"   Tobacco abuse      a. 40 yrs, 1.5-3 ppd over that time.           Past Surgical History:  Procedure Laterality Date   ANTERIOR LATERAL LUMBAR FUSION 4 LEVELS Right 06/05/2014    Procedure: ANTERIOR LATERAL LUMBAR FUSION  LUMBAR ONE TO LUMBAR FIVE with Percutaneous Pedicle Screws;  Surgeon: Erline Levine, MD;  Location: [facility];  Service: Neurosurgery;  Laterality: Right;   CARPAL TUNNEL RELEASE Right     CERVICAL DISC SURGERY        X 2   COLONOSCOPY       ELBOW ARTHROSCOPY Right     KNEE ARTHROSCOPY Right 08/2013    torn menicus   KNEE ARTHROSCOPY Left 11/2013    chip cartlidge,torn menicus   LUMBAR LAMINECTOMY/DECOMPRESSION MICRODISCECTOMY Right 07/11/2013    Procedure: LUMBAR LAMINECTOMY/DECOMPRESSION MICRODISCECTOMY 1 LEVEL;  Surgeon: Erline Levine, MD;  Location: [facility];  Service: Neurosurgery;  Laterality: Right;  Right L34 microdiskectomy   LUMBAR  PERCUTANEOUS PEDICLE SCREW 4 LEVEL N/A 06/05/2014    Procedure: LUMBAR PERCUTANEOUS PEDICLE SCREW 4 LEVEL;  Surgeon: Erline Levine, MD;  Location: [facility];  Service: Neurosurgery;  Laterality: N/A;   MICROLARYNGOSCOPY Left 05/12/2019    Procedure: MICROLARYNGOSCOPY WITH BIOPSY;  Surgeon: Rozetta Nunnery, MD;  Location: [facility];  Service: ENT;  Laterality: Left;   NECK SURGERY   January 2005, March 2009    C-Spine   POLYPECTOMY       ROTATOR CUFF REPAIR   August 2000, January 2002, July 2008, July 2009    2 left 2 right           Family History  Problem Relation Age of Onset   Bone cancer Mother     Squamous cell carcinoma Mother          died @ 51   Heart attack Mother     Hypertension Father     Diabetes Father          borderline   Congestive Heart Failure Father          alive @ 30   Heart failure Father     Sudden death Brother          died @ 35   Other Brother          died in MVA @ 64 - struck by drunk driver   Other Brother          died in [place] @ 30 - struck by drunk driver   Heart attack Brother     Stroke Paternal Grandfather     Colon cancer Neg Hx     Esophageal cancer Neg Hx     Rectal cancer Neg Hx     Stomach cancer Neg Hx     Colon polyps Neg Hx        SOCIAL HISTORY: Social History         Socioeconomic History   Marital status: Married      Spouse name: Not on file   Number of children: Not on file   Years of education: Not on file   Highest education level: Not on file  Occupational History   Not on file  Tobacco Use   Smoking status: Former Smoker      Packs/day: 1.00      Years: 42.00  Pack years: 42.00      Types: Cigarettes      Start date: 52      Quit date: 06/08/2019      Years since quitting: 1.5   Smokeless tobacco: Never Used   Tobacco comment: He is smoking about 4-5 cigarettes, He had smoked up to 3 packs daily. 06/03/19  Vaping Use   Vaping Use: Never used  Substance and Sexual Activity    Alcohol use: Yes      Comment: occasional   Drug use: No   Sexual activity: Not on file  Other Topics Concern   Not on file  Social History Narrative    Lives in [place] with wife.  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    Social Determinants of Health    Financial Resource Strain: Not on file  Food Insecurity: Not on file  Transportation Needs: Not on file  Physical Activity: Not on file  Stress: Not on file  Social Connections: Not on file  Intimate Partner Violence: Not on file           Allergies  Allergen Reactions   Lisinopril Swelling      Swollen Lip   Percodan [Oxycodone-Aspirin] Rash      Percodan caused rash---but he can tolerate Percocet and aspirin on their own             Current Outpatient Medications  Medication Sig Dispense Refill   albuterol (VENTOLIN HFA) 108 (90 Base) MCG/ACT inhaler Inhale 2 puffs into the lungs every 6 (six) hours as needed for wheezing or shortness of breath. 8 g 0   allopurinol (ZYLOPRIM) 100 MG tablet Take 1 tablet (100 mg total) by mouth daily. 90 tablet 3   atorvastatin (LIPITOR) 80 MG tablet Take 1 tablet (80 mg total) by mouth daily. 90 tablet 3   colchicine 0.6 MG tablet TAKE 1 TABLET (0.6 MG TOTAL) BY MOUTH DAILY AS NEEDED (GOUT). 90 tablet 1   cyclobenzaprine (FLEXERIL) 10 MG tablet Take 10 mg by mouth daily.       diclofenac sodium (VOLTAREN) 1 % GEL Apply 2 g topically as needed (for pain).   0   diltiazem (CARDIZEM CD) 360 MG 24 hr capsule TAKE 1 CAPSULE BY MOUTH EVERY DAY 90 capsule 3   HYDROcodone-acetaminophen (NORCO/VICODIN) 5-325 MG tablet Take 1 tablet by mouth every 8 (eight) hours as needed for moderate pain.   0   losartan (COZAAR) 100 MG tablet Take 1 tablet (100 mg total) by mouth daily. 90 tablet 3   omeprazole (PRILOSEC OTC) 20 MG tablet Take 2 tablets (40 mg total) by mouth daily.       omeprazole (PRILOSEC) 40 MG capsule Take 1 capsule (40 mg total) by mouth daily. 90 capsule 3    sertraline (ZOLOFT) 100 MG tablet Take 1 tablet (100 mg total) by mouth daily. 90 tablet 1   vitamin B-12 (CYANOCOBALAMIN) 1000 MCG tablet Take 1,000 mcg by mouth daily.        No current facility-administered medications for this visit.      REVIEW OF SYSTEMS:  [X]  denotes positive finding, [ ]  denotes negative finding Cardiac   Comments:  Chest pain or chest pressure:      Shortness of breath upon exertion:      Short of breath when lying flat:      Irregular heart rhythm:             Vascular  Pain in calf, thigh, or hip brought on by ambulation:      Pain in feet at night that wakes you up from your sleep:      Blood clot in your veins:      Leg swelling:             Pulmonary      Oxygen at home:      Productive cough:      Wheezing:             Neurologic      Sudden weakness in arms or legs:      Sudden numbness in arms or legs:      Sudden onset of difficulty speaking or slurred speech:      Temporary loss of vision in one eye:      Problems with dizziness:             Gastrointestinal      Blood in stool:      Vomited blood:             Genitourinary      Burning when urinating:      Blood in urine:             Psychiatric      Major depression:             Hematologic      Bleeding problems:      Problems with blood clotting too easily:             Skin      Rashes or ulcers:             Constitutional      Fever or chills:          PHYSICAL EXAM:    Vitals:    12/28/20 1037  BP: (!) 159/90  Pulse: 81  Resp: 18  Temp: 98 F (36.7 C)  TempSrc: Temporal  SpO2: 97%  Weight: 172 lb (78 kg)  Height: 5\' 8"  (1.727 m)      GENERAL: The patient is a well-nourished male, in no acute distress. The vital signs are documented above. CARDIAC: There is a regular rate and rhythm. VASCULAR:  Palpable femoral pulses bilaterally Palpable dorsalis pedis pulses bilaterally PULMONARY: No respiratory distress. ABDOMEN: Soft and  non-tender.. MUSCULOSKELETAL: There are no major deformities or cyanosis. NEUROLOGIC: No focal weakness or paresthesias are detected. SKIN: There are no ulcers or rashes noted. PSYCHIATRIC: The patient has a normal affect.   DATA:    I independently reviewed CT lumbar spine from 12/13/2020 and he has moderate to severe aortoiliac calcification, posterior hardware from L1-L5.   Assessment/Plan:   65 year old male presents for preop evaluation of L4-L5 ALIF.  Unfortunately he has had multiple posterior and lateral spinal surgeries with L1-L5 posterior fusion.  In discussing with Dr. Vertell Limber he feels he has no other options other than an anterior approach with need to explant the cage at L4-L5.  I reviewed the CT scan with the patient and his wife and discussed my concerns given he does have moderate to severe aortoiliac calcification.  Discussed this can be a very limiting factor for anterior exposure at the L4-L5 disc space given the requirement to move the distal aorta and iliac vessels.  I discussed my concerns and I discussed that this would be much higher risk given risk for injuring the vessel and risk of bleeding and ischemic leg.  Discussed would be a paramedian incision  over the left rectus with mobilization of the peritoneum and left ureter to midline and then moving the left iliac artery and vein.  Discussed injury to the above structures.  He does not feel his symptoms are manageable.  Ultimately it sounds like he is very debilitated and discussed I would  be willing to try an anterior approach if he feels that he cannot continue living life in his current state.  Discussed there would be some risk if I get in there and feel it is unsafe to proceed I would stop.     [name], MD [facility] Office: [phone]

## 2021-01-03 NOTE — Transfer of Care (Signed)
Immediate Anesthesia Transfer of Care Note  Patient: Aaron Black  Procedure(s) Performed: Lumbar Four-Five Anterior lumbar interbody fusion with revision of graft ABDOMINAL EXPOSURE  Patient Location: PACU  Anesthesia Type:General  Level of Consciousness: awake, alert  and oriented  Airway & Oxygen Therapy: Patient Spontanous Breathing  Post-op Assessment: Report given to RN and Post -op Vital signs reviewed and stable  Post vital signs: Reviewed and stable  Last Vitals:  Vitals Value Taken Time  BP 120/96 01/03/21 1723  Temp    Pulse 91 01/03/21 1726  Resp 26 01/03/21 1726  SpO2 95 % 01/03/21 1726  Vitals shown include unvalidated device data.  Last Pain:  Vitals:   01/03/21 0959  TempSrc:   PainSc: 0-No pain         Complications: No notable events documented.

## 2021-01-04 ENCOUNTER — Inpatient Hospital Stay (HOSPITAL_COMMUNITY): Payer: 59

## 2021-01-04 ENCOUNTER — Encounter (HOSPITAL_COMMUNITY): Payer: Self-pay | Admitting: Neurosurgery

## 2021-01-04 ENCOUNTER — Inpatient Hospital Stay (HOSPITAL_COMMUNITY): Payer: 59 | Admitting: Certified Registered Nurse Anesthetist

## 2021-01-04 ENCOUNTER — Encounter (HOSPITAL_COMMUNITY)
Admission: RE | Disposition: A | Discharge status: Home / Self Care | Payer: Self-pay | Source: Home / Self Care | Attending: Neurosurgery

## 2021-01-04 HISTORY — PX: ANTERIOR LAT LUMBAR FUSION: SHX1168

## 2021-01-04 SURGERY — ANTERIOR LATERAL LUMBAR FUSION 1 LEVEL
Anesthesia: General | Site: Flank | Laterality: Left

## 2021-01-04 MED ORDER — FENTANYL CITRATE (PF) 100 MCG/2ML IJ SOLN
25.0000 ug | INTRAMUSCULAR | Status: DC | PRN
  Administered 2021-01-04 (×3): 50 ug via INTRAVENOUS

## 2021-01-04 MED ORDER — PROPOFOL 500 MG/50ML IV EMUL
INTRAVENOUS | Status: DC | PRN
  Administered 2021-01-04: 70 ug/kg/min via INTRAVENOUS

## 2021-01-04 MED ORDER — FENTANYL CITRATE (PF) 100 MCG/2ML IJ SOLN
INTRAMUSCULAR | Status: AC
  Filled 2021-01-04: qty 2

## 2021-01-04 MED ORDER — SUCCINYLCHOLINE CHLORIDE 200 MG/10ML IV SOSY
PREFILLED_SYRINGE | INTRAVENOUS | Status: DC | PRN
  Administered 2021-01-04: 140 mg via INTRAVENOUS

## 2021-01-04 MED ORDER — METHYLPREDNISOLONE ACETATE 80 MG/ML IJ SUSP
INTRAMUSCULAR | Status: DC | PRN
  Administered 2021-01-04: 80 mg

## 2021-01-04 MED ORDER — MIDAZOLAM HCL 2 MG/2ML IJ SOLN
INTRAMUSCULAR | Status: AC
  Filled 2021-01-04: qty 2

## 2021-01-04 MED ORDER — LIDOCAINE-EPINEPHRINE 1 %-1:100000 IJ SOLN
INTRAMUSCULAR | Status: DC | PRN
  Administered 2021-01-04: 5 mL

## 2021-01-04 MED ORDER — MIDAZOLAM HCL 5 MG/5ML IJ SOLN
INTRAMUSCULAR | Status: DC | PRN
  Administered 2021-01-04: 2 mg via INTRAVENOUS

## 2021-01-04 MED ORDER — 0.9 % SODIUM CHLORIDE (POUR BTL) OPTIME
TOPICAL | Status: DC | PRN
  Administered 2021-01-04: 1000 mL

## 2021-01-04 MED ORDER — METHYLPREDNISOLONE ACETATE 80 MG/ML IJ SUSP
INTRAMUSCULAR | Status: AC
  Filled 2021-01-04: qty 1

## 2021-01-04 MED ORDER — BUPIVACAINE HCL (PF) 0.5 % IJ SOLN
INTRAMUSCULAR | Status: AC
  Filled 2021-01-04: qty 30

## 2021-01-04 MED ORDER — CEFAZOLIN SODIUM-DEXTROSE 2-4 GM/100ML-% IV SOLN
INTRAVENOUS | Status: AC
  Filled 2021-01-04: qty 100

## 2021-01-04 MED ORDER — PROPOFOL 10 MG/ML IV BOLUS
INTRAVENOUS | Status: AC
  Filled 2021-01-04: qty 20

## 2021-01-04 MED ORDER — ALBUMIN HUMAN 5 % IV SOLN: INTRAVENOUS | Status: DC | PRN

## 2021-01-04 MED ORDER — PHENYLEPHRINE HCL (PRESSORS) 10 MG/ML IV SOLN
INTRAVENOUS | Status: DC | PRN
  Administered 2021-01-04: 80 ug via INTRAVENOUS

## 2021-01-04 MED ORDER — CHLORHEXIDINE GLUCONATE 0.12 % MT SOLN
OROMUCOSAL | Status: AC
  Administered 2021-01-04: 15 mL via OROMUCOSAL
  Filled 2021-01-04: qty 15

## 2021-01-04 MED ORDER — PROPOFOL 10 MG/ML IV BOLUS
INTRAVENOUS | Status: DC | PRN
  Administered 2021-01-04: 150 mg via INTRAVENOUS

## 2021-01-04 MED ORDER — HYDROMORPHONE HCL 1 MG/ML IJ SOLN
0.5000 mg | INTRAMUSCULAR | Status: DC | PRN
Start: 2021-01-04 — End: 2021-01-05
  Administered 2021-01-04 (×2): 1 mg via INTRAVENOUS
  Filled 2021-01-04 (×2): qty 1

## 2021-01-04 MED ORDER — LACTATED RINGERS IV SOLN: INTRAVENOUS | Status: DC

## 2021-01-04 MED ORDER — ORAL CARE MOUTH RINSE: 15.0000 mL | Freq: Once | OROMUCOSAL | Status: AC

## 2021-01-04 MED ORDER — PHENYLEPHRINE HCL-NACL 10-0.9 MG/250ML-% IV SOLN
INTRAVENOUS | Status: DC | PRN
  Administered 2021-01-04: 30 ug/min via INTRAVENOUS

## 2021-01-04 MED ORDER — HYDROMORPHONE HCL 1 MG/ML IJ SOLN
INTRAMUSCULAR | Status: AC
  Filled 2021-01-04: qty 0.5

## 2021-01-04 MED ORDER — LIDOCAINE 2% (20 MG/ML) 5 ML SYRINGE
INTRAMUSCULAR | Status: DC | PRN
  Administered 2021-01-04: 100 mg via INTRAVENOUS

## 2021-01-04 MED ORDER — ONDANSETRON HCL 4 MG/2ML IJ SOLN
INTRAMUSCULAR | Status: DC | PRN
  Administered 2021-01-04: 4 mg via INTRAVENOUS

## 2021-01-04 MED ORDER — BUPIVACAINE HCL (PF) 0.5 % IJ SOLN
INTRAMUSCULAR | Status: DC | PRN
  Administered 2021-01-04: 5 mL

## 2021-01-04 MED ORDER — CHLORHEXIDINE GLUCONATE 0.12 % MT SOLN: 15.0000 mL | Freq: Once | OROMUCOSAL | Status: AC

## 2021-01-04 MED ORDER — FENTANYL CITRATE (PF) 250 MCG/5ML IJ SOLN
INTRAMUSCULAR | Status: DC | PRN
  Administered 2021-01-04: 50 ug via INTRAVENOUS
  Administered 2021-01-04: 25 ug via INTRAVENOUS
  Administered 2021-01-04: 150 ug via INTRAVENOUS
  Administered 2021-01-04: 25 ug via INTRAVENOUS
  Administered 2021-01-04: 50 ug via INTRAVENOUS

## 2021-01-04 MED ORDER — DEXAMETHASONE SODIUM PHOSPHATE 10 MG/ML IJ SOLN
INTRAMUSCULAR | Status: DC | PRN
  Administered 2021-01-04: 5 mg via INTRAVENOUS

## 2021-01-04 MED ORDER — FENTANYL CITRATE (PF) 250 MCG/5ML IJ SOLN
INTRAMUSCULAR | Status: AC
  Filled 2021-01-04: qty 5

## 2021-01-04 MED ORDER — THROMBIN 5000 UNITS EX SOLR
CUTANEOUS | Status: AC
  Filled 2021-01-04: qty 5000

## 2021-01-04 MED ORDER — THROMBIN 5000 UNITS EX SOLR
OROMUCOSAL | Status: DC | PRN
  Administered 2021-01-04: 5 mL via TOPICAL

## 2021-01-04 MED ORDER — CEFAZOLIN SODIUM-DEXTROSE 2-3 GM-%(50ML) IV SOLR
INTRAVENOUS | Status: DC | PRN
  Administered 2021-01-04: 2 g via INTRAVENOUS

## 2021-01-04 MED ORDER — LIDOCAINE-EPINEPHRINE 1 %-1:100000 IJ SOLN
INTRAMUSCULAR | Status: AC
  Filled 2021-01-04: qty 1

## 2021-01-04 SURGICAL SUPPLY — 57 items
ADH SKN CLS APL DERMABOND .7 (GAUZE/BANDAGES/DRESSINGS) ×1
BIT DRILL NEURO 2X3.1 SFT TUCH (MISCELLANEOUS) IMPLANT
BLADE CLIPPER SURG (BLADE) IMPLANT
CARTRIDGE OIL MAESTRO DRILL (MISCELLANEOUS) ×1 IMPLANT
CLIP PULSE STIMULATION (NEUROSURGERY SUPPLIES) ×1 IMPLANT
COVER WAND RF STERILE (DRAPES) ×1 IMPLANT
DECANTER SPIKE VIAL GLASS SM (MISCELLANEOUS) ×2 IMPLANT
DERMABOND ADVANCED (GAUZE/BANDAGES/DRESSINGS) ×1
DERMABOND ADVANCED .7 DNX12 (GAUZE/BANDAGES/DRESSINGS) ×2 IMPLANT
DIFFUSER DRILL AIR PNEUMATIC (MISCELLANEOUS) ×2 IMPLANT
DRAPE C-ARM 42X72 X-RAY (DRAPES) ×2 IMPLANT
DRAPE C-ARMOR (DRAPES) ×2 IMPLANT
DRAPE LAPAROTOMY 100X72X124 (DRAPES) ×2 IMPLANT
DRAPE MICROSCOPE LEICA (MISCELLANEOUS) ×1 IMPLANT
DRILL NEURO 2X3.1 SOFT TOUCH (MISCELLANEOUS) ×2
DRSG OPSITE POSTOP 3X4 (GAUZE/BANDAGES/DRESSINGS) ×2 IMPLANT
DURAPREP 26ML APPLICATOR (WOUND CARE) ×2 IMPLANT
ELECT REM PT RETURN 9FT ADLT (ELECTROSURGICAL) ×2
ELECTRODE REM PT RTRN 9FT ADLT (ELECTROSURGICAL) ×1 IMPLANT
GAUZE 4X4 16PLY RFD (DISPOSABLE) IMPLANT
GLOVE BIO SURGEON STRL SZ8 (GLOVE) ×2 IMPLANT
GLOVE BIOGEL PI IND STRL 8.5 (GLOVE) ×1 IMPLANT
GLOVE BIOGEL PI INDICATOR 8.5 (GLOVE) ×1
GLOVE ECLIPSE 8.0 STRL XLNG CF (GLOVE) ×2 IMPLANT
GLOVE SRG 8 PF TXTR STRL LF DI (GLOVE) ×1 IMPLANT
GLOVE SURG UNDER POLY LF SZ8 (GLOVE) ×2
GOWN STRL REUS W/ TWL LRG LVL3 (GOWN DISPOSABLE) IMPLANT
GOWN STRL REUS W/ TWL XL LVL3 (GOWN DISPOSABLE) ×2 IMPLANT
GOWN STRL REUS W/TWL 2XL LVL3 (GOWN DISPOSABLE) IMPLANT
GOWN STRL REUS W/TWL LRG LVL3 (GOWN DISPOSABLE)
GOWN STRL REUS W/TWL XL LVL3 (GOWN DISPOSABLE) ×4
HEMOSTAT POWDER KIT SURGIFOAM (HEMOSTASIS) ×2 IMPLANT
KIT BASIN OR (CUSTOM PROCEDURE TRAY) ×2 IMPLANT
KIT DILATOR XLIF 5 (KITS) IMPLANT
KIT SURGICAL ACCESS MAXCESS 4 (KITS) ×1 IMPLANT
KIT TURNOVER KIT B (KITS) ×2 IMPLANT
KIT XLIF (KITS) ×1
NDL HYPO 18GX1.5 BLUNT FILL (NEEDLE) IMPLANT
NDL HYPO 25X1 1.5 SAFETY (NEEDLE) ×1 IMPLANT
NEEDLE HYPO 18GX1.5 BLUNT FILL (NEEDLE) ×2 IMPLANT
NEEDLE HYPO 25X1 1.5 SAFETY (NEEDLE) ×2 IMPLANT
NS IRRIG 1000ML POUR BTL (IV SOLUTION) ×2 IMPLANT
OIL CARTRIDGE MAESTRO DRILL (MISCELLANEOUS) ×2
PACK LAMINECTOMY NEURO (CUSTOM PROCEDURE TRAY) ×2 IMPLANT
PROBE PULSE STIMULATION (NEUROSURGERY SUPPLIES) ×1 IMPLANT
SPONGE LAP 4X18 RFD (DISPOSABLE) IMPLANT
SPONGE SURGIFOAM ABS GEL SZ50 (HEMOSTASIS) IMPLANT
STAPLER SKIN PROX WIDE 3.9 (STAPLE) ×2 IMPLANT
SUT VIC AB 1 CT1 18XBRD ANBCTR (SUTURE) ×1 IMPLANT
SUT VIC AB 1 CT1 8-18 (SUTURE) ×2
SUT VIC AB 2-0 CT1 18 (SUTURE) ×2 IMPLANT
SUT VIC AB 3-0 SH 8-18 (SUTURE) ×2 IMPLANT
SYR 3ML LL SCALE MARK (SYRINGE) ×1 IMPLANT
TOWEL GREEN STERILE (TOWEL DISPOSABLE) ×2 IMPLANT
TOWEL GREEN STERILE FF (TOWEL DISPOSABLE) ×2 IMPLANT
TRAY FOLEY MTR SLVR 16FR STAT (SET/KITS/TRAYS/PACK) ×2 IMPLANT
WATER STERILE IRR 1000ML POUR (IV SOLUTION) ×2 IMPLANT

## 2021-01-04 NOTE — Anesthesia Preprocedure Evaluation (Addendum)
Anesthesia Evaluation  Patient identified by MRN, date of birth, ID band Patient awake    Reviewed: Allergy & Precautions, NPO status , Patient's Chart, lab work & pertinent test results  Airway Mallampati: II  TM Distance: >3 FB     Dental   Pulmonary shortness of breath, Patient abstained from smoking., former smoker,    breath sounds clear to auscultation       Cardiovascular hypertension,  Rhythm:Regular Rate:Normal     Neuro/Psych Anxiety Depression  Neuromuscular disease    GI/Hepatic Neg liver ROS, GERD  ,  Endo/Other  negative endocrine ROS  Renal/GU negative Renal ROS     Musculoskeletal   Abdominal   Peds  Hematology  (+) anemia ,   Anesthesia Other Findings   Reproductive/Obstetrics                             Anesthesia Physical Anesthesia Plan  ASA: 3  Anesthesia Plan: General   Post-op Pain Management:    Induction: Intravenous  PONV Risk Score and Plan: 3 and Ondansetron and Midazolam  Airway Management Planned: Oral ETT  Additional Equipment:   Intra-op Plan:   Post-operative Plan: Possible Post-op intubation/ventilation  Informed Consent: I have reviewed the patients History and Physical, chart, labs and discussed the procedure including the risks, benefits and alternatives for the proposed anesthesia with the patient or authorized representative who has indicated his/her understanding and acceptance.     Dental advisory given  Plan Discussed with: CRNA and Anesthesiologist  Anesthesia Plan Comments:         Anesthesia Quick Evaluation

## 2021-01-04 NOTE — Anesthesia Postprocedure Evaluation (Signed)
Anesthesia Post Note  Patient: Aaron Black  Procedure(s) Performed: Lumbar Four-Five Anterior lumbar interbody fusion with revision of graft ABDOMINAL EXPOSURE     Patient location during evaluation: Other Anesthesia Type: General Level of consciousness: awake and alert Pain management: pain level controlled Vital Signs Assessment: post-procedure vital signs reviewed and stable Respiratory status: spontaneous breathing, nonlabored ventilation and respiratory function stable Cardiovascular status: blood pressure returned to baseline and stable Postop Assessment: no apparent nausea or vomiting Anesthetic complications: no   No notable events documented.  Last Vitals:  Vitals:   01/04/21 1212 01/04/21 1400  BP: 139/73 (!) 178/78  Pulse: 85 86  Resp: 18 18  Temp: 36.9 C 36.9 C  SpO2: 96% 95%    Last Pain:  Vitals:   01/04/21 1400  TempSrc: Oral  PainSc: 5                  Thaer Miyoshi,W. EDMOND

## 2021-01-04 NOTE — Op Note (Signed)
01/03/2021 - 01/04/2021  5:35 PM  PATIENT:  Aaron Black  65 y.o. male  PRE-OPERATIVE DIAGNOSIS:  Lumbar Radiculopathy with broken cage  POST-OPERATIVE DIAGNOSIS:  Lumbar Radiculopathy with broken cage  PROCEDURE:  Procedure(s): Removal Of Broken Hardware (Left)  SURGEON:  Surgeon(s) and Role:    Erline Levine, MD - Primary  PHYSICIAN ASSISTANT: Marcello Moores, MD, Glenford Peers, NP  ASSISTANTS: Poteat, RN   ANESTHESIA:   general  EBL:  50 mL   BLOOD ADMINISTERED:none  DRAINS: none   LOCAL MEDICATIONS USED:  MARCAINE    and LIDOCAINE   SPECIMEN:  No Specimen  DISPOSITION OF SPECIMEN:  N/A  COUNTS:  YES  TOURNIQUET:  * No tourniquets in log *  DICTATION: Patient is a 65 year old with severe spondylosis stenosis and scoliosis of the lumbar spine. He had a pseudoarthrosis at L 45 with XLIF cage which had migrated into the left L 45 neural foramen with significant radiculopathy.  Yesterday, with Dr. Carlis Abbott, we performed ALIF at L 45 with removal of previously placed XLIF cage.  The required breaking the cage into pieces.  I was unable to remove the lateral-most cage fragment and did not feel it was safe to continue as it extended outside of the confines of the disc space.  He woke up with continued radicular pain, so, after a CT scan, I elected to take him back to surgery and to remove the cage fragment from a left lateral approach.     Procedure: Patient was brought to the operating room and placed in a right lateral decubitus position on the operative table and using orthogonally projected C-arm fluoroscopy the patient was placed so that the L4-5 levels were visualized in AP and lateral plane. The patient was then taped into position. The table was flexed so as to expose the L4-5 level as the patient has a high iliac crest. Skin was marked along with a posterior finger dissection incision. His flank was then prepped and draped in usual sterile fashion and incision was made at L4-5 level.  Posterior finger dissection was made to enter the retroperitoneal space and then subsequently the probe was inserted into the psoas muscle from the left side at the L4-5 level. After mapping the neural elements were able to dock the probe towards the neural foramen without indications electrically of too close proximity to the neural tissues. We employed SSEP and EMG monitoring with direct stimulation.  Subsequently the self-retaining tractor was.after sequential dilators were utilized and the wire was engaged in the superior L 5 vertebra posteriorly. After mapping the neural elements, the overlying psos and annulus was incised and the cage was exposed.  We attempted to mobilize it, but were unsuccessful, so we placed a pilot hole in the cage, screwed in an extraction device and then slap-hammered the cage fragment out.  It came out in its entirety.  There was no free run EMG or suggestion of nerve irritation or injury.  Hemostasis was assured the wounds were irrigated and the operative area was irrigated with 80 mg Depo-medrol.  The wounds were closed with interrupted Vicryl sutures and dressed with Dermabond and an occlusive dressing.  .  The patient was then extubated in the operating room and taken to recovery in stable and satisfactory condition having tolerated his operation well. Counts were correct at the end of the case.   PLAN OF CARE: Admit to inpatient   PATIENT DISPOSITION:  PACU - hemodynamically stable.   Delay start of Pharmacological VTE  agent (>24hrs) due to surgical blood loss or risk of bleeding: yes

## 2021-01-04 NOTE — Transfer of Care (Signed)
Immediate Anesthesia Transfer of Care Note  Patient: Aaron Black  Procedure(s) Performed: Removal Of Broken Hardware (Left: Flank)  Patient Location: PACU  Anesthesia Type:General  Level of Consciousness: awake, alert  and oriented  Airway & Oxygen Therapy: Patient Spontanous Breathing  Post-op Assessment: Report given to RN and Post -op Vital signs reviewed and stable  Post vital signs: Reviewed and stable  Last Vitals:  Vitals Value Taken Time  BP 123/77 01/04/21 1758  Temp 36.3 C 01/04/21 1758  Pulse 85 01/04/21 1802  Resp 12 01/04/21 1802  SpO2 98 % 01/04/21 1802  Vitals shown include unvalidated device data.  Last Pain:  Vitals:   01/04/21 1758  TempSrc:   PainSc: 0-No pain      Patients Stated Pain Goal: 2 (93/73/42 8768)  Complications: No notable events documented.

## 2021-01-04 NOTE — Progress Notes (Signed)
Subjective: Patient continues to report pain in his left knee that radiates down into his ankle. Appropriate incisional pain.   Objective: Vital signs in last 24 hours: Temp:  [97.4 F (36.3 C)-98.9 F (37.2 C)] 98 F (36.7 C) (06/14 0711) Pulse Rate:  [80-101] 87 (06/14 0711) Resp:  [2-22] 18 (06/14 0711) BP: (109-181)/(70-100) 127/77 (06/14 0711) SpO2:  [92 %-100 %] 99 % (06/14 0711) Weight:  [78 kg] 78 kg (06/13 0842)  Intake/Output from previous day: 06/13 0701 - 06/14 0700 In: 2900 [I.V.:2000; Blood:300; IV [id number]:591] Out: 2730 [Urine:1830; Blood:900] Intake/Output this shift: No intake/output data recorded.  Physical Exam: Patient is awake, A/O X 4, conversant, and in good spirits. Speech is fluent and appropriate. Doing well. MAEW with good strength that is symmetric bilaterally. Dressing is clean dry intact. Incision is well approximated with no drainage, erythema, or edema.   Lab Results: Recent Labs    01/03/21 0919 01/03/21 1459  WBC 6.1  --   HGB 12.8* 11.2*  HCT 38.6* 33.0*  PLT 241  --    BMET Recent Labs    01/03/21 0919 01/03/21 1459  NA 133* 133*  K 4.2 4.5  CL 102  --   CO2 23  --   GLUCOSE 93  --   BUN 11  --   CREATININE 0.83  --   CALCIUM 9.3  --     Studies/Results: DG Lumbar Spine 2-3 Views  Result Date: 01/03/2021 CLINICAL DATA:  Surgery, elective. Additional history provided: Lumbar 4-5 anterior lumbar interbody fusion with revision of graft. Provided fluoroscopy time 23 seconds (12.57 mGy). EXAM: LUMBAR SPINE - 2-3 VIEW; DG C-ARM 1-60 MIN COMPARISON:  CT of the lumbar spine 12/11/2020. FINDINGS: PA and lateral view intraoperative fluoroscopic images of the lumbar spine are submitted, 2 images total. On the provided images, anterior approach interbody fusion hardware is now present at the L4-L5 level. Partially visualized posterior spinal fusion construct extending caudally to the L5 level. Redemonstrated interbody devices at L2-L3 and  L3-L4. Overlying retractors. IMPRESSION: Two intraoperative fluoroscopic images of the lumbar spine from L4-L5 anterior lumbar decompression and fusion, as described. Electronically Signed   By: Kellie Simmering DO   On: 01/03/2021 17:50   DG C-Arm 1-60 Min  Result Date: 01/03/2021 CLINICAL DATA:  Surgery, elective. Additional history provided: Lumbar 4-5 anterior lumbar interbody fusion with revision of graft. Provided fluoroscopy time 23 seconds (12.57 mGy). EXAM: LUMBAR SPINE - 2-3 VIEW; DG C-ARM 1-60 MIN COMPARISON:  CT of the lumbar spine 12/11/2020. FINDINGS: PA and lateral view intraoperative fluoroscopic images of the lumbar spine are submitted, 2 images total. On the provided images, anterior approach interbody fusion hardware is now present at the L4-L5 level. Partially visualized posterior spinal fusion construct extending caudally to the L5 level. Redemonstrated interbody devices at L2-L3 and L3-L4. Overlying retractors. IMPRESSION: Two intraoperative fluoroscopic images of the lumbar spine from L4-L5 anterior lumbar decompression and fusion, as described. Electronically Signed   By: Kellie Simmering DO   On: 01/03/2021 17:50   DG OR LOCAL ABDOMEN  Result Date: 01/03/2021 CLINICAL DATA:  Rule out foreign body. EXAM: OR LOCAL ABDOMEN COMPARISON:  CT lumbar spine dated Dec 11, 2020. FINDINGS: No radiopaque foreign body identified. New L4-L5 ALIF. Prior L1-L5 PLIF with unchanged fracture of the right connecting rods between the L4 and L5 screws. IMPRESSION: 1. No radiopaque foreign body identified. These results were called by telephone at the time of interpretation on 01/03/2021 at 5:06 pm to  JILL GADDY, who verbally acknowledged these results. Electronically Signed   By: Titus Dubin M.D.   On: 01/03/2021 17:06    Assessment/Plan: Patient is post-op day 1 s/p Lumbar 4-5 Anterior lumbar interbody fusion with revision of graft. He has complaints of left knee pain that radiates down into his ankle. Plan  for CT LS without contrast. IIV decadron. Otherwise, he is recovering well. He has mild incisional discomfort.  He has ambulated in the hall with nursing staff. Awaiting PT/OT evaluation. Continue LSO brace when OOB. Continue working on pain control, mobility and ambulating patient. Will plan for discharge once CT results have been evaluated and plan made.    LOS: 1 day     Marvis Moeller, DNP, NP-C 01/04/2021, 8:14 AM

## 2021-01-04 NOTE — Progress Notes (Addendum)
PT Cancellation Note  Patient Details Name: Aaron Black MRN: [id number] DOB: [birth year]   Cancelled Treatment:    Reason Eval/Treat Not Completed: Awaiting clarification from MD. If pt to go back to surgery today, will hold until pt post-op. If no further surgery required, will return as able for PT evaluation.   Addendum: Discussed pt case with Jacqlyn Larsen and PT will initiate evaluation as able tomorrow, 6/15 as pt is returning to OR this afternoon.   Thelma Comp 01/04/2021, 10:00 AM  Rolinda Roan, PT, DPT Acute Rehabilitation Services Pager: [phone] Office: [phone]

## 2021-01-04 NOTE — Interval H&P Note (Signed)
History and Physical Interval Note:  01/04/2021 10:43 AM  Aaron Black  has presented today for surgery, with the diagnosis of Lumbar Radiculopathy.  The various methods of treatment have been discussed with the patient and family. After consideration of risks, benefits and other options for treatment, the patient has consented to  Procedure(s): Removal Of Broken Hardware (Left) as a surgical intervention.  The patient's history has been reviewed, patient examined, no change in status, stable for surgery.  I have reviewed the patient's chart and labs.  Questions were answered to the patient's satisfaction.     Peggyann Shoals

## 2021-01-04 NOTE — H&P (View-Only) (Signed)
There is a small piece of PEEK cage retained on the left which was impossible for me to safely remove from the anterior exposure, which is contributing to the patient's ongoing radiculopathy.  I have explained to the patient and his wife that the safest way to remove this will be from a lateral approach.  They are agreeable to proceeding with this.  We will do so later today, using nerve monitoring via a tubular retractor.

## 2021-01-04 NOTE — Progress Notes (Signed)
Patient transferred to OR at this time. Alert and in stable condition. Report given to receiving nurse Horris Latino, RN with all questions answered. Left unit via bed with spouse at side.

## 2021-01-04 NOTE — Progress Notes (Signed)
[facility]  Subjective  -still having a lot of pain in the left leg particularly behind the knee   Objective 127/77 87 98 F (36.7 C) (Oral) 18 99%  Intake/Output Summary (Last 24 hours) at 01/04/2021 1024 Last data filed at 01/04/2021 1005 Gross per 24 hour  Intake 2900 ml  Output 2930 ml  Net -30 ml    Appropriate postop abdominal incisional tenderness Bilateral DP pulses palpable Calves soft  Laboratory Lab Results: Recent Labs    01/03/21 0919 01/03/21 1459  WBC 6.1  --   HGB 12.8* 11.2*  HCT 38.6* 33.0*  PLT 241  --    BMET Recent Labs    01/03/21 0919 01/03/21 1459  NA 133* 133*  K 4.2 4.5  CL 102  --   CO2 23  --   GLUCOSE 93  --   BUN 11  --   CREATININE 0.83  --   CALCIUM 9.3  --     COAG No results found for: INR, PROTIME No results found for: PTT  Assessment/Planning:  Postop day 1 status post L4-L5 ALIF.  Seems to have appropriate postop incisional tenderness in the abdomen with palpable pedal pulses in bilateral lower extremities.  This was a challenging case given the degree of calcification in the iliac arteries.  Plan is CT spine this morning by neurosurgery given ongoing radiculopathy in the left leg.  [name] 01/04/2021 10:24 AM --

## 2021-01-04 NOTE — Anesthesia Procedure Notes (Signed)
Procedure Name: Intubation Date/Time: 01/04/2021 3:15 PM Performed by: Clearnce Sorrel, CRNA Pre-anesthesia Checklist: Patient identified, Emergency Drugs available, Suction available and Patient being monitored Patient Re-evaluated:Patient Re-evaluated prior to induction Oxygen Delivery Method: Circle System Utilized Preoxygenation: Pre-oxygenation with 100% oxygen Induction Type: IV induction, Rapid sequence and Cricoid Pressure applied Laryngoscope Size: Glidescope and 4 Grade View: Grade I Tube type: Oral Tube size: 7.5 mm Number of attempts: 1 Airway Equipment and Method: Rigid stylet Placement Confirmation: ETT inserted through vocal cords under direct vision, positive ETCO2 and breath sounds checked- equal and bilateral Secured at: 22 cm Tube secured with: Tape Dental Injury: Teeth and Oropharynx as per pre-operative assessment  Comments: Inserted by Paulina Fusi, SRNA

## 2021-01-04 NOTE — Progress Notes (Signed)
There is a small piece of PEEK cage retained on the left which was impossible for me to safely remove from the anterior exposure, which is contributing to the patient's ongoing radiculopathy.  I have explained to the patient and his wife that the safest way to remove this will be from a lateral approach.  They are agreeable to proceeding with this.  We will do so later today, using nerve monitoring via a tubular retractor.

## 2021-01-04 NOTE — Progress Notes (Signed)
Occupational Therapy Evaluation Patient Details Name: Aaron Black MRN: [id number] DOB: June 03, 1956 Today's Date: 01/04/2021    History of Present Illness Aaron Black is a 65 y.o. male, s/p Anterior spine exposure at the L4-L5 disc space via anterior retroperitoneal approach for L4-L5 ALIF 6/13.  Patient has a complex history of spine surgery and initially underwent a microdiscectomy 2014 at L3-L4.  Later in 2015 he had anterior lateral lumbar fusion from L1-L5 with pedicle screws.  Most recently had revision of the posterior hardware in 2017.  PMH includes chronic back pain, hypertension, hyperlipidemia   Clinical Impression   Aaron Black was evaluated s/p the above back sx. PTA he was indep in all ADL/IADLs, he lives in a 1 level home with 1 STE and his wife who can provide 24/7 assistance at d/c. Upon evaluation, pt was in a lot of shooting pain and discomfort. MD placed pt on NPO orders and pt has plans for CT today to determine need for further treatment. Session was focused on back precaution education and DME needs. Pt verbalized great understanding of precautions and compensatory techniques for ADLs. Pt would benefit from continued OT services to progress function in ADLs and mobility. Recommend d/c home with [facility].     Follow Up Recommendations  Home health OT;Supervision/Assistance - 24 hour    Equipment Recommendations  3 in 1 bedside commode       Precautions / Restrictions Precautions Precautions: Back;Fall Precaution Booklet Issued: Yes (comment) Precaution Comments: verbally reviewed all back precautions and compensatroy techniques Required Braces or Orthoses: Spinal Brace Spinal Brace: Lumbar corset Restrictions Weight Bearing Restrictions: No Other Position/Activity Restrictions: no lifting more than 5 lbs      Mobility Bed Mobility Overal bed mobility: Needs Assistance Bed Mobility: Rolling;Sidelying to Sit Rolling: Min guard Sidelying to sit: Min guard        General bed mobility comments: cueing for compensatory techniques    Transfers Overall transfer level: Needs assistance               General transfer comment: deferred this session until results of CT scan come back    Balance Overall balance assessment: Needs assistance                 ADL either performed or assessed with clinical judgement   ADL Overall ADL's : Needs assistance/impaired Eating/Feeding: NPO   Grooming: Wash/dry hands;Wash/dry face;Oral care;Applying deodorant;Set up;Sitting;Bed level   Upper Body Bathing: Bed level;Minimal assistance;Sitting   Lower Body Bathing: Moderate assistance;Sit to/from stand   Upper Body Dressing : Set up;Sitting   Lower Body Dressing: Moderate assistance;Sit to/from stand   Toilet Transfer: Min guard;RW;Ambulation   Toileting- Water quality scientist and Hygiene: Supervision/safety;Sit to/from stand       Functional mobility during ADLs: Min guard;Rolling walker General ADL Comments: pt at bed level this morning due to MD concern of an additional sx needed; pt with shooting pain and in back with any movement      Pertinent Vitals/Pain Pain Assessment: Faces Faces Pain Scale: Hurts even more Pain Location: back adn L knee Pain Descriptors / Indicators: Discomfort;Shooting;Grimacing;Guarding Pain Intervention(s): Limited activity within patient's tolerance     Hand Dominance Right   Extremity/Trunk Assessment Upper Extremity Assessment Upper Extremity Assessment: Overall WFL for tasks assessed   Lower Extremity Assessment Lower Extremity Assessment: Defer to PT evaluation   Cervical / Trunk Assessment Cervical / Trunk Assessment: Normal;Other exceptions (s/p anterior approach back sx)   Communication Communication Communication: No difficulties  Cognition Arousal/Alertness: Awake/alert Behavior During Therapy: WFL for tasks assessed/performed Overall Cognitive Status: Within Functional Limits for  tasks assessed               General Comments  Pt with pain in back, legs and stomach incision site; MD ordered CT or pt and considering the need for revision. Pt seen at bed level for education and DME needs.            Home Living Family/patient expects to be discharged to:: Private residence Living Arrangements: Spouse/significant other Available Help at Discharge: Family;Available 24 hours/day Type of Home: House Home Access: Stairs to enter [employer] of Steps: 1   Home Layout: One level     Bathroom Shower/Tub: Occupational psychologist: Handicapped height     Home Equipment: Environmental consultant - 2 wheels;Cane - single point          Prior Functioning/Environment Level of Independence: Independent        Comments: Pt fell on ice in [place] and has had worsening pain since then        OT Problem List: Decreased strength;Decreased range of motion;Decreased activity tolerance;Impaired balance (sitting and/or standing);Decreased knowledge of use of DME or AE;Decreased safety awareness;Decreased knowledge of precautions;Pain      OT Treatment/Interventions: Self-care/ADL training;Therapeutic exercise;Therapeutic activities;Patient/family education;Balance training    OT Goals(Current goals can be found in the care plan section) Acute Rehab OT Goals Patient Stated Goal: less pain OT Goal Formulation: With patient Potential to Achieve Goals: Good ADL Goals Pt Will Perform Lower Body Bathing: with modified independence;with adaptive equipment;sitting/lateral leans Pt Will Perform Lower Body Dressing: with modified independence;with adaptive equipment;sit to/from stand Pt Will Transfer to Toilet: with modified independence;ambulating;grab bars Pt Will Perform Toileting - Clothing Manipulation and hygiene: Independently;sit to/from stand Pt Will Perform Tub/Shower Transfer: with modified independence;shower seat;rolling walker Additional ADL Goal #1: pt  will independently verbalize 3/3 back precautions to promote safe d/c home  OT Frequency: Min 2X/week    AM-PAC OT "6 Clicks" Daily Activity     Outcome Measure Help from another person eating meals?: Total Help from another person taking care of personal grooming?: A Little Help from another person toileting, which includes using toliet, bedpan, or urinal?: A Little Help from another person bathing (including washing, rinsing, drying)?: A Lot Help from another person to put on and taking off regular upper body clothing?: A Little Help from another person to put on and taking off regular lower body clothing?: A Lot 6 Click Score: 14   End of Session Nurse Communication: Mobility status;Other (comment) (DME needs)  Activity Tolerance: Patient limited by pain Patient left: in bed;with call bell/phone within reach;with family/visitor present  OT Visit Diagnosis: Other abnormalities of gait and mobility (R26.89);Muscle weakness (generalized) (M62.81);Pain                Time: 8891-6945 OT Time Calculation (min): 14 min Charges:  OT General Charges $OT Visit: 1 Visit OT Evaluation $OT Eval Low Complexity: 1 Low   Aaron Black A Takeem Krotzer 01/04/2021, 9:16 AM

## 2021-01-04 NOTE — Brief Op Note (Signed)
01/03/2021 - 01/04/2021  5:35 PM  PATIENT:  Aaron Black  65 y.o. male  PRE-OPERATIVE DIAGNOSIS:  Lumbar Radiculopathy with broken cage  POST-OPERATIVE DIAGNOSIS:  Lumbar Radiculopathy with broken cage  PROCEDURE:  Procedure(s): Removal Of Broken Hardware (Left)  SURGEON:  Surgeon(s) and Role:    Erline Levine, MD - Primary  PHYSICIAN ASSISTANT: Marcello Moores, MD, Glenford Peers, NP  ASSISTANTS: [place], RN   ANESTHESIA:   general  EBL:  50 mL   BLOOD ADMINISTERED:none  DRAINS: none   LOCAL MEDICATIONS USED:  MARCAINE    and LIDOCAINE   SPECIMEN:  No Specimen  DISPOSITION OF SPECIMEN:  N/A  COUNTS:  YES  TOURNIQUET:  * No tourniquets in log *  DICTATION: Patient is a 65 year old with severe spondylosis stenosis and scoliosis of the lumbar spine. He had a pseudoarthrosis at L 45 with XLIF cage which had migrated into the left L 45 neural foramen with significant radiculopathy.  Yesterday, with Dr. Carlis Abbott, we performed ALIF at L 45 with removal of previously placed XLIF cage.  The required breaking the cage into pieces.  I was unable to remove the lateral-most cage fragment and did not feel it was safe to continue as it extended outside of the confines of the disc space.  He woke up with continued radicular pain, so, after a CT scan, I elected to take him back to surgery and to remove the cage fragment from a left lateral approach.     Procedure: Patient was brought to the operating room and placed in a right lateral decubitus position on the operative table and using orthogonally projected C-arm fluoroscopy the patient was placed so that the L4-5 levels were visualized in AP and lateral plane. The patient was then taped into position. The table was flexed so as to expose the L4-5 level as the patient has a high iliac crest. Skin was marked along with a posterior finger dissection incision. His flank was then prepped and draped in usual sterile fashion and incision was made at L4-5 level.  Posterior finger dissection was made to enter the retroperitoneal space and then subsequently the probe was inserted into the psoas muscle from the left side at the L4-5 level. After mapping the neural elements were able to dock the probe towards the neural foramen without indications electrically of too close proximity to the neural tissues. We employed SSEP and EMG monitoring with direct stimulation.  Subsequently the self-retaining tractor was.after sequential dilators were utilized and the wire was engaged in the superior L 5 vertebra posteriorly. After mapping the neural elements, the overlying psos and annulus was incised and the cage was exposed.  We attempted to mobilize it, but were unsuccessful, so we placed a pilot hole in the cage, screwed in an extraction device and then slap-hammered the cage fragment out.  It came out in its entirety.  There was no free run EMG or suggestion of nerve irritation or injury.  Hemostasis was assured the wounds were irrigated and the operative area was irrigated with 80 mg Depo-medrol.  The wounds were closed with interrupted Vicryl sutures and dressed with Dermabond and an occlusive dressing.  .  The patient was then extubated in the operating room and taken to recovery in stable and satisfactory condition having tolerated his operation well. Counts were correct at the end of the case.   PLAN OF CARE: Admit to inpatient   PATIENT DISPOSITION:  PACU - hemodynamically stable.   Delay start of Pharmacological VTE  agent (>24hrs) due to surgical blood loss or risk of bleeding: yes

## 2021-01-05 ENCOUNTER — Encounter (HOSPITAL_COMMUNITY): Payer: Self-pay | Admitting: Neurosurgery

## 2021-01-05 LAB — TYPE AND SCREEN
ABO/RH(D): O POS
Antibody Screen: NEGATIVE
Unit division: 0
Unit division: 0

## 2021-01-05 LAB — BPAM RBC
Blood Product Expiration Date: 202207122359
Blood Product Expiration Date: 202207122359
ISSUE DATE / TIME: 202206131220
ISSUE DATE / TIME: 202206131220
Unit Type and Rh: 5100
Unit Type and Rh: 5100

## 2021-01-05 MED ORDER — OXYCODONE-ACETAMINOPHEN 5-325 MG PO TABS: 1.0000 | ORAL_TABLET | Freq: Four times a day (QID) | ORAL | 0 refills | Status: DC | PRN

## 2021-01-05 NOTE — Progress Notes (Signed)
Patient is discharged from 3C08 at this time. Alert and in stable condition. IV site d/c'd and instructions read to patient and spouse with understanding verbalized and all questions answered. Left unit via wheelchair with all belongings at side.

## 2021-01-05 NOTE — Anesthesia Postprocedure Evaluation (Signed)
Anesthesia Post Note  Patient: Aaron Black  Procedure(s) Performed: Removal Of Broken Hardware (Left: Flank)     Patient location during evaluation: PACU Anesthesia Type: General Level of consciousness: awake Pain management: pain level controlled Respiratory status: spontaneous breathing Cardiovascular status: stable Postop Assessment: no apparent nausea or vomiting Anesthetic complications: no   No notable events documented.  Last Vitals:  Vitals:   01/05/21 0400 01/05/21 0748  BP: 126/70 (!) 147/78  Pulse: 78 93  Resp: 18 18  Temp: 36.8 C 36.8 C  SpO2: 95% 99%    Last Pain:  Vitals:   01/05/21 0946  TempSrc:   PainSc: 6                  Tasmia Blumer

## 2021-01-05 NOTE — Progress Notes (Addendum)
Progress Note    01/05/2021 7:43 AM 1 Day Post-Op  Subjective:  Pain improved. Tolerating diet and voiding spontaneously. Ambulating short distances with assistance.   Vitals:   01/04/21 2338 01/05/21 0400  BP: (!) 143/76 126/70  Pulse: 92 78  Resp: 20 18  Temp: 98 F (36.7 C) 98.2 F (36.8 C)  SpO2: 99% 95%    Physical Exam: General appearance: Awake, alert in no apparent distress Cardiac: Heart rate and rhythm are regular Respirations: Nonlabored Incisions: Left lower quadrant incision well approximated without bleeding or hematoma. Honeycomb dressing intact and dry. Mild incisional ecchymosis. Extremities: Both feet are warm with intact sensation and motor function.  3+ bilateral DP pulses. Abdomen: soft with normoactive bowel sounds  CBC    Component Value Date/Time   WBC 6.1 01/03/2021 0919   RBC 4.18 (L) 01/03/2021 0919   HGB 11.2 (L) 01/03/2021 1459   HCT 33.0 (L) 01/03/2021 1459   PLT 241 01/03/2021 0919   MCV 92.3 01/03/2021 0919   MCH 30.6 01/03/2021 0919   MCHC 33.2 01/03/2021 0919   RDW 13.5 01/03/2021 0919   LYMPHSABS 1.1 12/02/2020 0743   MONOABS 0.5 12/02/2020 0743   EOSABS 0.1 12/02/2020 0743   BASOSABS 0.0 12/02/2020 0743    BMET    Component Value Date/Time   NA 133 (L) 01/03/2021 1459   K 4.5 01/03/2021 1459   CL 102 01/03/2021 0919   CO2 23 01/03/2021 0919   GLUCOSE 93 01/03/2021 0919   BUN 11 01/03/2021 0919   CREATININE 0.83 01/03/2021 0919   CALCIUM 9.3 01/03/2021 0919   GFRNONAA >60 01/03/2021 0919   GFRAA >60 01/24/2019 1021     Intake/Output Summary (Last 24 hours) at 01/05/2021 0743 Last data filed at 01/05/2021 0410 Gross per 24 hour  Intake 1550 ml  Output 2730 ml  Net -1180 ml    HOSPITAL MEDICATIONS Scheduled Meds:  allopurinol  100 mg Oral q AM   atorvastatin  80 mg Oral q AM   diltiazem  360 mg Oral q AM   docusate sodium  100 mg Oral BID   losartan  100 mg Oral q AM   pantoprazole  40 mg Oral Daily    sertraline  100 mg Oral q AM   sodium chloride flush  3 mL Intravenous Q12H   vitamin B-12  1,000 mcg Oral q AM   Continuous Infusions:  sodium chloride     dextrose 5 % and 0.45 % NaCl with KCl 20 mEq/L     methocarbamol (ROBAXIN) IV     PRN Meds:.acetaminophen **OR** acetaminophen, alum & mag hydroxide-simeth, bisacodyl, colchicine, cyclobenzaprine, HYDROcodone-acetaminophen, HYDROcodone-acetaminophen, HYDROmorphone (DILAUDID) injection, menthol-cetylpyridinium **OR** phenol, methocarbamol **OR** methocarbamol (ROBAXIN) IV, ondansetron **OR** ondansetron (ZOFRAN) IV, polyethylene glycol, sodium chloride flush, sodium phosphate, zolpidem  Assessment and Plan: POD 2 s/p L4-L5 ALIF. LE well perfused. No ileus. Stable from vascular surgery standpoint. POD 1 removal of broken hardware from left lateral approach with resolution of radiculopathy.  Risa Grill, PA-C [facility] [phone] 01/05/2021  7:43 AM   I have seen and evaluated the patient. I agree with the PA note as documented above.  Postop day 2 status post L4-L5 abdominal exposure for ALIF.  He went back to the OR yesterday for removal of broken hardware from a lateral approach that could not be retrieved from an anterior approach.  Radiculopathy left leg now improved.  Palpable DP pulses bilaterally.  Appropriate postop incisional tenderness.  Tolerating p.o.  Looks  good from a vascular standpoint.  [name], MD [facility] Office: [phone]

## 2021-01-05 NOTE — Evaluation (Signed)
Physical Therapy Evaluation and Discharge Patient Details Name: Aaron Black MRN: [id number] DOB: [birth year] Today's Date: 01/05/2021   History of Present Illness  Pt is a 65 y/o male, s/p L4-L5 ALIF 6/13. He presented back to surgery 6/14 for removal of broken hardware. Patient has a complex history of spine surgery and initially underwent a microdiscectomy 2014 at L3-L4.  Later in 2015 he had anterior lateral lumbar fusion from L1-L5 with pedicle screws.  Most recently had revision of the posterior hardware in 2017.  PMH includes chronic back pain, HTN   Clinical Impression  Patient evaluated by Physical Therapy with no further acute PT needs identified. All education has been completed and the patient has no further questions. Pt was able to demonstrate transfers and ambulation with gross modified independence to supervision for safety. Pt was educated on precautions, brace application/wearing schedule, appropriate activity progression, and car transfer. See below for any follow-up Physical Therapy or equipment needs. PT is signing off. Thank you for this referral.     Follow Up Recommendations No PT follow up;Supervision for mobility/OOB    Equipment Recommendations  None recommended by PT    Recommendations for Other Services       Precautions / Restrictions Precautions Precautions: Back;Fall Precaution Booklet Issued: Yes (comment) Precaution Comments: verbally reviewed all back precautions and compensatroy techniques Required Braces or Orthoses: Spinal Brace Spinal Brace: Lumbar corset Restrictions Weight Bearing Restrictions: No      Mobility  Bed Mobility Overal bed mobility: Modified Independent Bed Mobility: Rolling;Sidelying to Sit           General bed mobility comments: VC's for optimal log roll technique. HOB flat and rails lowered to simulate home environment.    Transfers Overall transfer level: Modified independent Equipment used: Rolling walker (2  wheeled)             General transfer comment: VC's for optimal posture. Good power up to full stand.  Ambulation/Gait Ambulation/Gait assistance: Modified independent (Device/Increase time);Supervision Gait Distance (Feet): 300 Feet Assistive device: Rolling walker (2 wheeled);None Gait Pattern/deviations: Step-through pattern;Decreased stride length;Trunk flexed Gait velocity: Decreased Gait velocity interpretation: 1.31 - 2.62 ft/sec, indicative of limited community ambulator General Gait Details: Initially with RW pt ambulating at a mod I level. Without RW, pt required supervision for safety.  Stairs            Wheelchair Mobility    Modified Rankin (Stroke Patients Only)       Balance Overall balance assessment: Needs assistance Sitting-balance support: Feet supported;No upper extremity supported Sitting balance-Leahy Scale: Fair     Standing balance support: No upper extremity supported;During functional activity Standing balance-Leahy Scale: Fair                               Pertinent Vitals/Pain Pain Assessment: Faces Faces Pain Scale: Hurts a little bit Pain Location: back and L knee Pain Descriptors / Indicators: Discomfort;Shooting;Grimacing;Guarding Pain Intervention(s): Limited activity within patient's tolerance;Monitored during session;Repositioned    Home Living Family/patient expects to be discharged to:: Private residence Living Arrangements: Spouse/significant other Available Help at Discharge: Family;Available 24 hours/day Type of Home: House Home Access: Stairs to enter   [employer] of Steps: 1 Home Layout: One level Home Equipment: Walker - 2 wheels;Cane - single point      Prior Function Level of Independence: Independent         Comments: Pt fell on ice in [place] and  has had worsening pain since then     Hand Dominance   Dominant Hand: Right    Extremity/Trunk Assessment   Upper Extremity  Assessment Upper Extremity Assessment: Defer to OT evaluation    Lower Extremity Assessment Lower Extremity Assessment: Generalized weakness    Cervical / Trunk Assessment Cervical / Trunk Assessment: Other exceptions (s/p surgery)  Communication   Communication: No difficulties  Cognition Arousal/Alertness: Awake/alert Behavior During Therapy: WFL for tasks assessed/performed Overall Cognitive Status: Within Functional Limits for tasks assessed                                        General Comments      Exercises     Assessment/Plan    PT Assessment Patent does not need any further PT services  PT Problem List Decreased strength;Decreased activity tolerance;Decreased balance;Decreased mobility;Decreased knowledge of use of DME;Decreased safety awareness;Decreased knowledge of precautions;Pain       PT Treatment Interventions      PT Goals (Current goals can be found in the Care Plan section)  Acute Rehab PT Goals Patient Stated Goal: less pain PT Goal Formulation: All assessment and education complete, DC therapy    Frequency     Barriers to discharge        Co-evaluation               AM-PAC PT "6 Clicks" Mobility  Outcome Measure Help needed turning from your back to your side while in a flat bed without using bedrails?: None Help needed moving from lying on your back to sitting on the side of a flat bed without using bedrails?: None Help needed moving to and from a bed to a chair (including a wheelchair)?: None Help needed standing up from a chair using your arms (e.g., wheelchair or bedside chair)?: None Help needed to walk in hospital room?: A Little Help needed climbing 3-5 steps with a railing? : A Little 6 Click Score: 22    End of Session Equipment Utilized During Treatment: Gait belt;Back brace Activity Tolerance: Patient tolerated treatment well Patient left: in bed;with call bell/phone within reach;with family/visitor  present Nurse Communication: Mobility status PT Visit Diagnosis: Unsteadiness on feet (R26.81);Pain Pain - part of body:  (back)    Time: 5974-1638 PT Time Calculation (min) (ACUTE ONLY): 20 min   Charges:   PT Evaluation $PT Eval Low Complexity: 1 Low          Rolinda Roan, PT, DPT Acute Rehabilitation Services Pager: [phone] Office: [phone]   Thelma Comp 01/05/2021, 9:28 AM

## 2021-01-05 NOTE — Progress Notes (Signed)
Subjective: Patient reports that he is doing well and is pleased with his postoperative status. He reports a resolution of his LLE pain. He has mild appropriate incisional pain. No acute events overnight.  Objective: Vital signs in last 24 hours: Temp:  [97 F (36.1 C)-98.5 F (36.9 C)] 98.2 F (36.8 C) (06/15 0748) Pulse Rate:  [78-93] 93 (06/15 0748) Resp:  [12-20] 18 (06/15 0748) BP: (123-178)/(70-84) 147/78 (06/15 0748) SpO2:  [94 %-99 %] 99 % (06/15 0748) Weight:  [78.5 kg] 78.5 kg (06/14 1400)  Intake/Output from previous day: 06/14 0701 - 06/15 0700 In: 1550 [I.V.:1300; IV Piggyback:250] Out: 2730 [Urine:2680; Blood:50] Intake/Output this shift: No intake/output data recorded.  Physical Exam: Patient is awake, A/O X 4, conversant, and in good spirits. Speech is fluent and appropriate. Doing well. MAEW with good strength that is symmetric bilaterally. Dressings are clean dry intact. Incisions are well approximated with no drainage, erythema, or edema.    Lab Results: Recent Labs    01/03/21 0919 01/03/21 1459  WBC 6.1  --   HGB 12.8* 11.2*  HCT 38.6* 33.0*  PLT 241  --    BMET Recent Labs    01/03/21 0919 01/03/21 1459  NA 133* 133*  K 4.2 4.5  CL 102  --   CO2 23  --   GLUCOSE 93  --   BUN 11  --   CREATININE 0.83  --   CALCIUM 9.3  --     Studies/Results: DG Lumbar Spine 2-3 Views  Result Date: 01/04/2021 CLINICAL DATA:  Surgery, elective. Additional history provided: Removal of broken hardware. Provided fluoroscopy time 1 minutes, 5 seconds (30.10 mGy). EXAM: LUMBAR SPINE - 2-3 VIEW; DG C-ARM 1-60 MIN COMPARISON:  CT of the lumbar spine 01/04/2021. FINDINGS: PA and lateral view intraoperative fluoroscopic images of the lumbosacral spine are submitted, 3 images total. On the provided images, there is redemonstrated anterior approach interbody fusion hardware at L4-L5. Redemonstrated interbody devices at L2-L3 and L3-L4. Partially imaged posterior spinal  fusion construct extending caudally to the L5 level. Overlying retractors. Correlate with the procedural history. IMPRESSION: Three intraoperative fluoroscopic images of the lumbosacral spine, as described. Correlate with the procedural history. Electronically Signed   By: Kellie Simmering DO   On: 01/04/2021 17:36   DG Lumbar Spine 2-3 Views  Result Date: 01/03/2021 CLINICAL DATA:  Surgery, elective. Additional history provided: Lumbar 4-5 anterior lumbar interbody fusion with revision of graft. Provided fluoroscopy time 23 seconds (12.57 mGy). EXAM: LUMBAR SPINE - 2-3 VIEW; DG C-ARM 1-60 MIN COMPARISON:  CT of the lumbar spine 12/11/2020. FINDINGS: PA and lateral view intraoperative fluoroscopic images of the lumbar spine are submitted, 2 images total. On the provided images, anterior approach interbody fusion hardware is now present at the L4-L5 level. Partially visualized posterior spinal fusion construct extending caudally to the L5 level. Redemonstrated interbody devices at L2-L3 and L3-L4. Overlying retractors. IMPRESSION: Two intraoperative fluoroscopic images of the lumbar spine from L4-L5 anterior lumbar decompression and fusion, as described. Electronically Signed   By: Kellie Simmering DO   On: 01/03/2021 17:50   CT LUMBAR SPINE WO CONTRAST  Result Date: 01/04/2021 CLINICAL DATA:  Lumbar radiculopathy. Back surgery yesterday. Left leg pain. EXAM: CT LUMBAR SPINE WITHOUT CONTRAST TECHNIQUE: Multidetector CT imaging of the lumbar spine was performed without intravenous contrast administration. Multiplanar CT image reconstructions were also generated. COMPARISON:  CT lumbar spine 12/11/2020 FINDINGS: Segmentation: Normal Alignment: Mild retrolisthesis L1-2 otherwise normal alignment Vertebrae: Negative for fracture  or mass Paraspinal and other soft tissues: Small amount of gas and edema in the retroperitoneum on the left due to recent anterior approach for fusion at L4-5. No fluid collection identified  Atherosclerotic calcification aorta and iliac arteries without aneurysm. No paraspinous mass. Disc levels: T12-L1: Disc and facet degeneration. Diffuse disc bulging. No significant stenosis. L1-2: PLIF with solid interbody fusion. Moderate subarticular stenosis bilaterally due to spurring L2-3: PLIF.  Solid interbody fusion.  No significant stenosis. L3-4: PLIF with solid interbody fusion.  No significant stenosis. L4-5: Interval anterior interbody fusion. Interbody spacer and screws in good position. Bone graft in good position. Bilateral pedicle screw and rod fusion in good position without loosening. Moderate right foraminal encroachment due to spurring. L5-S1: Mild disc and facet degeneration.  Negative for stenosis. IMPRESSION: 1. Postop day 1 anterior interbody fusion L4-5. Interbody prosthesis and bone graft in good position. Mild retroperitoneal edema and gas on the left due to surgery yesterday. Moderate right foraminal encroachment due to spurring 2. Solid fusion L1-2, L2-3, and L3-4 is unchanged from prior CT. Electronically Signed   By: Franchot Gallo M.D.   On: 01/04/2021 09:27   DG C-Arm 1-60 Min  Result Date: 01/04/2021 CLINICAL DATA:  Surgery, elective. Additional history provided: Removal of broken hardware. Provided fluoroscopy time 1 minutes, 5 seconds (30.10 mGy). EXAM: LUMBAR SPINE - 2-3 VIEW; DG C-ARM 1-60 MIN COMPARISON:  CT of the lumbar spine 01/04/2021. FINDINGS: PA and lateral view intraoperative fluoroscopic images of the lumbosacral spine are submitted, 3 images total. On the provided images, there is redemonstrated anterior approach interbody fusion hardware at L4-L5. Redemonstrated interbody devices at L2-L3 and L3-L4. Partially imaged posterior spinal fusion construct extending caudally to the L5 level. Overlying retractors. Correlate with the procedural history. IMPRESSION: Three intraoperative fluoroscopic images of the lumbosacral spine, as described. Correlate with the  procedural history. Electronically Signed   By: Kellie Simmering DO   On: 01/04/2021 17:36   DG C-Arm 1-60 Min  Result Date: 01/03/2021 CLINICAL DATA:  Surgery, elective. Additional history provided: Lumbar 4-5 anterior lumbar interbody fusion with revision of graft. Provided fluoroscopy time 23 seconds (12.57 mGy). EXAM: LUMBAR SPINE - 2-3 VIEW; DG C-ARM 1-60 MIN COMPARISON:  CT of the lumbar spine 12/11/2020. FINDINGS: PA and lateral view intraoperative fluoroscopic images of the lumbar spine are submitted, 2 images total. On the provided images, anterior approach interbody fusion hardware is now present at the L4-L5 level. Partially visualized posterior spinal fusion construct extending caudally to the L5 level. Redemonstrated interbody devices at L2-L3 and L3-L4. Overlying retractors. IMPRESSION: Two intraoperative fluoroscopic images of the lumbar spine from L4-L5 anterior lumbar decompression and fusion, as described. Electronically Signed   By: Kellie Simmering DO   On: 01/03/2021 17:50   DG OR LOCAL ABDOMEN  Result Date: 01/03/2021 CLINICAL DATA:  Rule out foreign body. EXAM: OR LOCAL ABDOMEN COMPARISON:  CT lumbar spine dated Dec 11, 2020. FINDINGS: No radiopaque foreign body identified. New L4-L5 ALIF. Prior L1-L5 PLIF with unchanged fracture of the right connecting rods between the L4 and L5 screws. IMPRESSION: 1. No radiopaque foreign body identified. These results were called by telephone at the time of interpretation on 01/03/2021 at 5:06 pm to [place], who verbally acknowledged these results. Electronically Signed   By: Titus Dubin M.D.   On: 01/03/2021 17:06    Assessment/Plan: Patient is post-op day 2 s/p L4/5 ALIF and postop day 1 s/p removal of broken hardware. He is recovering well and  reports a resolution of his preoperative symptoms and his left knee and shin pain.  His only complaint is mild incisional discomfort.  He has worked with OT who is recommending home health OT. He is  awating PT evaluation. He has ambulated with nursing staff. Continue LSO brace when OOB. Continue working on pain control, mobility and ambulating patient. Will plan for discharge today.    LOS: 2 days     Marvis Moeller, DNP, NP-C 01/05/2021, 9:14 AM

## 2021-01-05 NOTE — Discharge Instructions (Signed)

## 2021-01-05 NOTE — Discharge Summary (Signed)
Physician Discharge Summary  Patient ID: Aaron Black MRN: [id number] DOB/AGE: 63-Dec-1957 63 y.o.  Admit date: 01/03/2021 Discharge date: 01/05/2021  Admission Diagnoses: Pseudoarthrosis of lumbar spine with lumbar radiculopathy after cage migration after a fall  Discharge Diagnoses: Pseudoarthrosis of lumbar spine with lumbar radiculopathy after cage migration after a fall, Lumbar Radiculopathy with broken cage Active Problems:   Lumbar pseudoarthrosis   Discharged Condition: good  Hospital Course: The patient was admitted on 01/03/2021 and taken to the operating room where the patient underwent lumbar Four-Five Anterior lumbar interbody fusion with revision of graft lumbar Four-Five Anterior lumbar interbody fusion with revision of graft (removal of XLIF cage, foraminaotomy and placement of ALIF cage with interfixated bolts. After his surgery, he continued to have severe pain around his left patella that radiated down into his left ankle. He was sent for a CT lumbar spine and it was decided to take him back to surgery on 01/04/2021 to remove a small piece of PEEK cage that was retained on the left which was impossible safely remove from the anterior exposure. The patient tolerated both procedures well. He was stable in PACU and then taken to the floor in stable condition. There were no complications. The wounds remained clean dry and intact. Pt had appropriate back soreness and side. No complaints of leg pain or new N/T/W. The patient remained afebrile with stable vital signs, and tolerated a regular diet. The patient continued to increase activities, and pain was well controlled with oral pain medications.   Consults: None  Significant Diagnostic Studies: radiology: X-Ray: intraopertive  and CT scan: lumbar spine without contrast  Treatments: surgery: Lumbar Four-Five Anterior lumbar interbody fusion with revision of graft (N/A) - Lumbar Four-Five Anterior lumbar interbody fusion with  revision of graft (removal of XLIF cage, foraminaotomy and placement of ALIF cage with interfixated bolts. ABDOMINAL EXPOSURE (N/A), Removal Of Broken Hardware (Left),   Discharge Exam: Blood pressure (!) 147/78, pulse 93, temperature 98.2 F (36.8 C), temperature source Oral, resp. rate 18, height 5\' 8"  (1.727 m), weight 78.5 kg, SpO2 99 %.  Physical Exam: Patient is awake, A/O X 4, conversant, and in good spirits. Speech is fluent and appropriate. Doing well. MAEW with good strength that is symmetric bilaterally. Dressings are clean dry intact. Incisions are well approximated with no drainage, erythema, or edema.     Disposition: Discharge disposition: 01-Home or Self Care       Discharge Instructions     Home Health   Complete by: As directed    To provide the following care/treatments: OT      Allergies as of 01/05/2021       Reactions   Lisinopril Swelling   Swollen Lip   Percodan [oxycodone-aspirin] Rash   Percodan caused rash---but he can tolerate Percocet and aspirin on their own         Medication List     STOP taking these medications    HYDROcodone-acetaminophen 5-325 MG tablet Commonly known as: NORCO/VICODIN       TAKE these medications    colchicine 0.6 MG tablet TAKE 1 TABLET (0.6 MG TOTAL) BY MOUTH DAILY AS NEEDED (GOUT).   cyclobenzaprine 10 MG tablet Commonly known as: FLEXERIL Take 10 mg by mouth in the morning and at bedtime.   oxyCODONE-acetaminophen 5-325 MG tablet Commonly known as: Percocet Take 1-2 tablets by mouth every 6 (six) hours as needed for severe pain.   vitamin B-12 1000 MCG tablet Commonly known as: CYANOCOBALAMIN Take 1,000 mcg  by mouth in the morning.       ASK your doctor about these medications    allopurinol 100 MG tablet Commonly known as: ZYLOPRIM Take 1 tablet (100 mg total) by mouth daily.   atorvastatin 80 MG tablet Commonly known as: LIPITOR Take 1 tablet (80 mg total) by mouth daily.    diltiazem 360 MG 24 hr capsule Commonly known as: CARDIZEM CD TAKE 1 CAPSULE BY MOUTH EVERY DAY   losartan 100 MG tablet Commonly known as: COZAAR Take 1 tablet (100 mg total) by mouth daily.   omeprazole 40 MG capsule Commonly known as: PRILOSEC Take 1 capsule (40 mg total) by mouth daily.   sertraline 100 MG tablet Commonly known as: ZOLOFT Take 1 tablet (100 mg total) by mouth daily.         Signed: Marvis Moeller, DNP, NP-C 01/05/2021, 9:24 AM

## 2021-01-06 MED FILL — Sodium Chloride IV Soln 0.9%: INTRAVENOUS | Qty: 1000 | Status: AC

## 2021-01-06 MED FILL — Sodium Chloride Irrigation Soln 0.9%: Qty: 3000 | Status: AC

## 2021-01-06 MED FILL — Heparin Sodium (Porcine) Inj 1000 Unit/ML: INTRAMUSCULAR | Qty: 30 | Status: AC

## 2021-01-26 DIAGNOSIS — M5416 Radiculopathy, lumbar region: Secondary | ICD-10-CM | POA: Diagnosis not present

## 2021-03-09 DIAGNOSIS — M5416 Radiculopathy, lumbar region: Secondary | ICD-10-CM | POA: Diagnosis not present

## 2021-03-23 ENCOUNTER — Other Ambulatory Visit: Payer: Self-pay

## 2021-03-23 ENCOUNTER — Ambulatory Visit (INDEPENDENT_AMBULATORY_CARE_PROVIDER_SITE_OTHER): Payer: 59 | Admitting: Otolaryngology

## 2021-03-23 DIAGNOSIS — Z8521 Personal history of malignant neoplasm of larynx: Secondary | ICD-10-CM | POA: Diagnosis not present

## 2021-03-23 NOTE — Progress Notes (Signed)
HPI: Aaron Black is a 65 y.o. male who returns today for evaluation of throat complaints.  He complains of a little bit above sore throat or throat irritation high in the throat in the region of the oropharynx and tonsils.  This is doing little bit better today.  He is having no trouble swallowing.  No significant change in his voice. 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 Isidore Black that he completed in December 2020.  Past Medical History:  Diagnosis Date   Anemia    Anxiety    Arm pain    Arthritis    B12 deficiency    Back pain    Cancer (HCC)    throat cancer    Cellulitis of arm, left 10/2017   after cat scratch   Cervical disc disease    a. 07/2003 ant cervical deomcpression and fusion C5-6/C6-7;  b. 09/2007 post cervical laminectomy C4-5 with scres and arthrodesis C4-C7.   Chest pain at rest 02/25/2013   Chronic lower back pain    Complication of anesthesia    " i WOKE UP DURING A COLONOSCOPY "- 2008   Depression    Diverticulosis of colon    GERD (gastroesophageal reflux disease)    Gout    Hyperlipidemia    Hypertension    Joint pain    Leg pain    While walking   Leg weakness    Neck pain    Pleurisy    Shortness of breath dyspnea    with exertion- "I cant exercise now"   Tobacco abuse    a. 40 yrs, 1.5-3 ppd over that time.   Past Surgical History:  Procedure Laterality Date   ABDOMINAL EXPOSURE N/A 01/03/2021   Procedure: ABDOMINAL EXPOSURE;  Surgeon: Aaron Heck, MD;  Location: [place];  Service: Vascular;  Laterality: N/A;   ANTERIOR LAT LUMBAR FUSION Left 01/04/2021   Procedure: Removal Of Broken Hardware;  Surgeon: Aaron Levine, MD;  Location: [place];  Service: Neurosurgery;  Laterality: Left;   ANTERIOR LATERAL LUMBAR FUSION 4 LEVELS Right 06/05/2014   Procedure: ANTERIOR LATERAL LUMBAR FUSION  LUMBAR ONE TO LUMBAR FIVE with Percutaneous Pedicle Screws;   Surgeon: Aaron Levine, MD;  Location: [facility];  Service: Neurosurgery;  Laterality: Right;   ANTERIOR LUMBAR FUSION N/A 01/03/2021   Procedure: Lumbar Four-Five Anterior lumbar interbody fusion with revision of graft;  Surgeon: Aaron Levine, MD;  Location: [place];  Service: Neurosurgery;  Laterality: N/A;  Lumbar Four-Five Anterior lumbar interbody fusion with revision of graft   CARPAL TUNNEL RELEASE Right    CERVICAL DISC SURGERY     X 2   COLONOSCOPY     ELBOW ARTHROSCOPY Right    KNEE ARTHROSCOPY Right 08/2013   torn menicus   KNEE ARTHROSCOPY Left 11/2013   chip [place],torn menicus   LUMBAR LAMINECTOMY/DECOMPRESSION MICRODISCECTOMY Right 07/11/2013   Procedure: LUMBAR LAMINECTOMY/DECOMPRESSION MICRODISCECTOMY 1 LEVEL;  Surgeon: Aaron Levine, MD;  Location: [place];  Service: Neurosurgery;  Laterality: Right;  Right L34 microdiskectomy   LUMBAR PERCUTANEOUS PEDICLE SCREW 4 LEVEL N/A 06/05/2014   Procedure: LUMBAR PERCUTANEOUS PEDICLE SCREW 4 LEVEL;  Surgeon: Aaron Levine, MD;  Location: [place] [facility];  Service: Neurosurgery;  Laterality: N/A;   MICROLARYNGOSCOPY Left 05/12/2019   Procedure: MICROLARYNGOSCOPY WITH BIOPSY;  Surgeon: Aaron Nunnery, MD;  Location: [place];  Service: ENT;  Laterality:  Left;   NECK SURGERY  January 2005, March 2009   C-Spine   POLYPECTOMY     ROTATOR CUFF REPAIR  August 2000, January 2002, July 2008, July 2009   2 left 2 right   Social History   Socioeconomic History   Marital status: Married    Spouse name: Not on file   Number of children: Not on file   Years of education: Not on file   Highest education level: Not on file  Occupational History   Not on file  Tobacco Use   Smoking status: Former    Packs/day: 1.00    Years: 42.00    Pack years: 42.00    Types: Cigarettes    Start date: 64    Quit date: 06/08/2019    Years since quitting: 1.7   Smokeless tobacco: Never   Tobacco comments:    He is  smoking about 4-5 cigarettes, He had smoked up to 3 packs daily. 06/03/19  Vaping Use   Vaping Use: Never used  Substance and Sexual Activity   Alcohol use: Yes    Comment: occasional   Drug use: No   Sexual activity: Not on file  Other Topics Concern   Not on file  Social History Narrative   Lives in [place] with wife.  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   Social Determinants of Radio broadcast assistant Strain: Not on file  Food Insecurity: Not on file  Transportation Needs: Not on file  Physical Activity: Not on file  Stress: Not on file  Social Connections: Not on file   Family History  Problem Relation Age of Onset   Bone cancer Mother    Squamous cell carcinoma Mother        died @ 47   Heart attack Mother    Hypertension Father    Diabetes Father        borderline   Congestive Heart Failure Father        alive @ 51   Heart failure Father    Sudden death Brother        died @ 48   Other Brother        died in [place] @ 24 - struck by drunk driver   Other Brother        died in [place] @ 27 - struck by drunk driver   Heart attack Brother    Stroke Paternal Grandfather    Colon cancer Neg Hx    Esophageal cancer Neg Hx    Rectal cancer Neg Hx    Stomach cancer Neg Hx    Colon polyps Neg Hx    Allergies  Allergen Reactions   Lisinopril Swelling    Swollen Lip   Percodan [Oxycodone-Aspirin] Rash    Percodan caused rash---but he can tolerate Percocet and aspirin on their own    Prior to Admission medications   Medication Sig Start Date End Date Taking? Authorizing Provider  allopurinol (ZYLOPRIM) 100 MG tablet Take 1 tablet (100 mg total) by mouth daily. Patient taking differently: Take 100 mg by mouth in the morning. 12/02/20   Nafziger, Aaron Rumps, NP  atorvastatin (LIPITOR) 80 MG tablet Take 1 tablet (80 mg total) by mouth daily. Patient taking differently: Take 80 mg by mouth in the morning. 12/02/20   Nafziger, Aaron Rumps, NP  colchicine 0.6  MG tablet TAKE 1 TABLET (0.6 MG TOTAL) BY MOUTH DAILY AS NEEDED (GOUT). 03/20/18   Aaron Black  F, MD  cyclobenzaprine (FLEXERIL) 10 MG tablet Take 10 mg by mouth in the morning and at bedtime. 12/27/18   [provider]  diltiazem (CARDIZEM CD) 360 MG 24 hr capsule TAKE 1 CAPSULE BY MOUTH EVERY DAY Patient taking differently: Take 360 mg by mouth in the morning. 12/02/20   Nafziger, Aaron Rumps, NP  losartan (COZAAR) 100 MG tablet Take 1 tablet (100 mg total) by mouth daily. Patient taking differently: Take 100 mg by mouth in the morning. 12/02/20 03/02/21  Nafziger, Aaron Rumps, NP  omeprazole (PRILOSEC) 40 MG capsule Take 1 capsule (40 mg total) by mouth daily. Patient taking differently: Take 40 mg by mouth in the morning. 12/02/20   Nafziger, Aaron Rumps, NP  oxyCODONE-acetaminophen (PERCOCET) 5-325 MG tablet Take 1-2 tablets by mouth every 6 (six) hours as needed for severe pain. 01/05/21 01/05/22  Marvis Moeller, NP  sertraline (ZOLOFT) 100 MG tablet Take 1 tablet (100 mg total) by mouth daily. Patient taking differently: Take 100 mg by mouth in the morning. 12/02/20   Nafziger, Aaron Rumps, NP  vitamin B-12 (CYANOCOBALAMIN) 1000 MCG tablet Take 1,000 mcg by mouth in the morning.    [provider]     Positive ROS: Otherwise negative  All other systems have been reviewed and were otherwise negative with the exception of those mentioned in the HPI and as above.  Physical Exam: Constitutional: Alert, well-appearing, no acute distress Ears: External ears without lesions or tenderness. Ear canals are clear bilaterally with intact, clear TMs.  Nasal: External nose without lesions. Septum slightly deviated to the left. Clear nasal passages otherwise. Oral: Lips and gums without lesions. Tongue and palate mucosa without lesions. Posterior oropharynx clear.  Tonsil regions appear benign bilaterally.  There is no exudate.  Oropharyngeal mucosa was clear with no exudate or lesions noted.  Indirect  laryngoscopy revealed a clear base of tongue vallecula and epiglottis.  I was able to adequately visualize the vocal cords in both cords were clear bilaterally but were mildly edematous.  Vocal cords had symmetric mobility.  No leukoplakia or lesions noted on either vocal cord. Neck: No palpable adenopathy or masses.  No palpable adenopathy on either side of the neck. Respiratory: Breathing comfortably  Skin: No facial/neck lesions or rash noted.  Procedures  Assessment: History of a T1b squamous cell carcinoma the vocal cords status postradiation therapy completed in December 2020.  Plan: Reassured patient of clear upper airway exam in the office today with no evidence of neoplasm. He is feeling a little bit better today. He has a scheduled follow-up with Dr. Isidore Black in 4 to 5 months.  Discussed with him today concerning follow-up with another ENT doctor in 7 to 8 months per Dr. Pearlie Oyster recommendation.   Radene Journey, MD

## 2021-04-01 DIAGNOSIS — Z0100 Encounter for examination of eyes and vision without abnormal findings: Secondary | ICD-10-CM | POA: Diagnosis not present

## 2021-04-01 DIAGNOSIS — H52203 Unspecified astigmatism, bilateral: Secondary | ICD-10-CM | POA: Diagnosis not present

## 2021-04-15 ENCOUNTER — Encounter: Payer: Self-pay | Admitting: Radiation Oncology

## 2021-04-15 ENCOUNTER — Other Ambulatory Visit: Payer: Self-pay

## 2021-04-15 ENCOUNTER — Ambulatory Visit
Admission: RE | Admit: 2021-04-15 | Discharge: 2021-04-15 | Disposition: A | Discharge status: Home / Self Care | Payer: 59 | Source: Ambulatory Visit | Attending: Radiation Oncology | Admitting: Radiation Oncology

## 2021-04-15 VITALS — BP 174/93 | HR 84 | Temp 97.3°F | Resp 18 | Wt 174.4 lb

## 2021-04-15 DIAGNOSIS — Z923 Personal history of irradiation: Secondary | ICD-10-CM | POA: Diagnosis not present

## 2021-04-15 DIAGNOSIS — Z79899 Other long term (current) drug therapy: Secondary | ICD-10-CM | POA: Diagnosis not present

## 2021-04-15 DIAGNOSIS — C320 Malignant neoplasm of glottis: Secondary | ICD-10-CM

## 2021-04-15 DIAGNOSIS — Z8521 Personal history of malignant neoplasm of larynx: Secondary | ICD-10-CM | POA: Diagnosis not present

## 2021-04-15 DIAGNOSIS — Z08 Encounter for follow-up examination after completed treatment for malignant neoplasm: Secondary | ICD-10-CM | POA: Diagnosis not present

## 2021-04-15 NOTE — Progress Notes (Signed)
Radiation Oncology         [phone] ________________________________  Name: AZAHEL BELCASTRO MRN: [id number]  Date: 04/15/2021  DOB: 1955-10-26  Follow-Up Visit Note - Outpatient  CC: Dorothyann Peng, NP  Rozetta Nunnery, *  Diagnosis and Prior Radiotherapy:       ICD-10-CM   1. 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 Christen presents today for follow-up of radiation to his larynx completed on 07/17/2019  Pain issues, if any: Denies any mouth or neck pain. Recovering from 2 back surgeries this past June Using a feeding tube?: N/A Weight changes, if any: Reports a healthy appetite  Wt Readings from Last 3 Encounters:  04/15/21 174 lb 6.4 oz (79.1 kg)  01/04/21 173 lb (78.5 kg)  12/28/20 172 lb (78 kg)   Swallowing issues, if any: Patient denies. Reports he can eat a wide variety of foods and beverages.  Smoking or chewing tobacco? None  Using fluoride trays daily? N/A--denies any dental concerns Last ENT visit was on: 03/23/2021 Saw Dr. [name]:  "Plan: --Reassured patient of clear upper airway exam in the office today with no evidence of neoplasm. --He is feeling a little bit better today. --He has a scheduled follow-up with Dr. Isidore Moos in 4 to 5 months.   --Discussed with him today concerning follow-up with another ENT doctor in 7 to 8 months per Dr. Pearlie Oyster recommendation" Other notable issues, if any: Continues to deal with vocal hoarseness, dry mouth, and low saliva  production. Denies any ear or jaw pain. Denies any swelling or symptoms of lymphedema to his neck.  He is recovering from multiple back surgeries and using a walker today  Vitals:   04/15/21 1052  BP: (!) 174/93  Pulse: 84  Resp: 18  Temp: (!) 97.3 F (36.3 C)  SpO2: 97%          ALLERGIES:  is allergic to lisinopril and percodan [oxycodone-aspirin].  Meds: Current Outpatient Medications  Medication Sig Dispense Refill   allopurinol (ZYLOPRIM) 100 MG tablet Take 1 tablet (100 mg total) by mouth daily. (Patient taking differently: Take 100 mg by mouth in the morning.) 90 tablet 3   atorvastatin (LIPITOR) 80 MG tablet Take 1 tablet (80 mg total) by mouth daily. (Patient taking differently: Take 80 mg by mouth in the morning.) 90 tablet 3   colchicine 0.6 MG tablet TAKE 1 TABLET (0.6 MG TOTAL) BY MOUTH DAILY AS NEEDED (GOUT). 90 tablet 1   cyclobenzaprine (FLEXERIL) 10 MG tablet Take 10 mg by mouth in the morning and at bedtime.     diltiazem (CARDIZEM CD) 360 MG 24 hr capsule TAKE 1 CAPSULE BY MOUTH EVERY DAY (Patient taking differently: Take 360 mg by mouth in the morning.) 90 capsule 3   gabapentin (NEURONTIN) 300 MG capsule Take 300 mg by mouth in the morning, at noon, in the evening, and at bedtime.     losartan (COZAAR) 100 MG tablet Take 1 tablet (100 mg total) by  mouth daily. (Patient taking differently: Take 100 mg by mouth in the morning.) 90 tablet 3   omeprazole (PRILOSEC) 40 MG capsule Take 1 capsule (40 mg total) by mouth daily. (Patient taking differently: Take 40 mg by mouth in the morning.) 90 capsule 3   oxyCODONE-acetaminophen (PERCOCET) 5-325 MG tablet Take 1-2 tablets by mouth every 6 (six) hours as needed for severe pain. 20 tablet 0   sertraline (ZOLOFT) 100 MG tablet Take 1 tablet (100 mg total) by mouth daily. (Patient taking differently: Take 100 mg by mouth in the morning.) 90 tablet 1   vitamin B-12 (CYANOCOBALAMIN) 1000 MCG tablet Take 1,000 mcg by mouth in the  morning.     No current facility-administered medications for this encounter.    Physical Findings: The patient is in no acute distress. Patient is alert and oriented. Wt Readings from Last 3 Encounters:  04/15/21 174 lb 6.4 oz (79.1 kg)  01/04/21 173 lb (78.5 kg)  12/28/20 172 lb (78 kg)    weight is 174 lb 6.4 oz (79.1 kg). His temperature is 97.3 F (36.3 C) (abnormal). His blood pressure is 174/93 (abnormal) and his pulse is 84. His respiration is 18 and oxygen saturation is 97%. .  General: Alert and oriented, in no acute distress HEENT: Head is normocephalic. Extraocular movements are intact. Oropharynx is clear. Neck: Neck is supple, no palpable cervical or supraclavicular lymphadenopathy. Extremities: No cyanosis or edema. Lymphatics: see Neck Exam Psychiatric: Judgment and insight are intact. Affect is appropriate. Heart regular in rate and rhythm Chest clear to auscultation bilaterally    Lab Findings: Lab Results  Component Value Date   WBC 6.1 01/03/2021   HGB 11.2 (L) 01/03/2021   HCT 33.0 (L) 01/03/2021   MCV 92.3 01/03/2021   PLT 241 01/03/2021    Lab Results  Component Value Date   TSH 0.90 12/02/2020    Radiographic Findings: No results found.  Impression/Plan:    1) Head and Neck Cancer Status: NED, laryngoscopy deferred today as he underwent this 3 weeks ago with Dr. Lucia Gaskins  2) Nutritional Status: denies issues PEG tube: none  3) Risk Factors: The patient has been educated about risk factors including tobacco abuse; they understand that avoidance of smoking/tobacco is important to prevent recurrences as well as other cancers.    He is abstaining from any form of tobacco or vaping  4) Swallowing: good function  5)  Thyroid function: Most recent lab test is WNL, continue this lab annually w/ PCP as RT can increase risk of hypothyroidism. Lab Results  Component Value Date   TSH 0.90 12/02/2020    6) Other: followup with me in 1 year.  Our  navigator will arrange follow-up with Dr. Benjamine Mola in 6 months as Dr. Lucia Gaskins is retiring.  Patient has the contact information for Dr. Benjamine Mola in case he does not get a phone call with an appointment.   On date of service, in total, I spent 20 minutes on this encounter. Patient was seen in person.  _____________________________________   Eppie Gibson, MD

## 2021-04-15 NOTE — Progress Notes (Signed)
Aaron Black presents today for follow-up of radiation to his larynx completed on 07/17/2019  Pain issues, if any: Denies any mouth or neck pain. Recovering from 2 back surgeries this past June Using a feeding tube?: N/A Weight changes, if any: Reports a healthy appetite  Wt Readings from Last 3 Encounters:  04/15/21 174 lb 6.4 oz (79.1 kg)  01/04/21 173 lb (78.5 kg)  12/28/20 172 lb (78 kg)   Swallowing issues, if any: Patient denies. Reports he can eat a wide variety of foods and beverages.  Smoking or chewing tobacco? None  Using fluoride trays daily? N/A--denies any dental concerns Last ENT visit was on: 03/23/2021 Saw Dr. [name]:  "Plan: --Reassured patient of clear upper airway exam in the office today with no evidence of neoplasm. --He is feeling a little bit better today. --He has a scheduled follow-up with Dr. Isidore Moos in 4 to 5 months.   --Discussed with him today concerning follow-up with another ENT doctor in 7 to 8 months per Dr. Pearlie Oyster recommendation" Other notable issues, if any: Continues to deal with vocal hoarseness, dry mouth, and low saliva production. Denies any ear or jaw pain. Denies any swelling or symptoms of lymphedema to his neck.    Vitals:   04/15/21 1052  BP: (!) 174/93  Pulse: 84  Resp: 18  Temp: (!) 97.3 F (36.3 C)  SpO2: 97%

## 2021-04-21 DIAGNOSIS — L814 Other melanin hyperpigmentation: Secondary | ICD-10-CM | POA: Diagnosis not present

## 2021-04-21 DIAGNOSIS — L570 Actinic keratosis: Secondary | ICD-10-CM | POA: Diagnosis not present

## 2021-04-21 DIAGNOSIS — D225 Melanocytic nevi of trunk: Secondary | ICD-10-CM | POA: Diagnosis not present

## 2021-04-21 DIAGNOSIS — L821 Other seborrheic keratosis: Secondary | ICD-10-CM | POA: Diagnosis not present

## 2021-04-28 ENCOUNTER — Other Ambulatory Visit: Payer: Self-pay | Admitting: Neurosurgery

## 2021-04-28 DIAGNOSIS — G8929 Other chronic pain: Secondary | ICD-10-CM

## 2021-05-11 ENCOUNTER — Ambulatory Visit
Admission: RE | Admit: 2021-05-11 | Discharge: 2021-05-11 | Disposition: A | Discharge status: Home / Self Care | Payer: 59 | Source: Ambulatory Visit | Attending: Neurosurgery | Admitting: Neurosurgery

## 2021-05-11 DIAGNOSIS — M25552 Pain in left hip: Secondary | ICD-10-CM | POA: Diagnosis not present

## 2021-05-11 DIAGNOSIS — M4326 Fusion of spine, lumbar region: Secondary | ICD-10-CM | POA: Diagnosis not present

## 2021-05-11 DIAGNOSIS — G8929 Other chronic pain: Secondary | ICD-10-CM

## 2021-05-11 DIAGNOSIS — M5125 Other intervertebral disc displacement, thoracolumbar region: Secondary | ICD-10-CM | POA: Diagnosis not present

## 2021-05-11 DIAGNOSIS — M47817 Spondylosis without myelopathy or radiculopathy, lumbosacral region: Secondary | ICD-10-CM | POA: Diagnosis not present

## 2021-05-11 MED ORDER — GADOBENATE DIMEGLUMINE 529 MG/ML IV SOLN
15.0000 mL | Freq: Once | INTRAVENOUS | Status: AC | PRN
  Administered 2021-05-11: 15 mL via INTRAVENOUS

## 2021-05-19 ENCOUNTER — Other Ambulatory Visit: Payer: Self-pay | Admitting: Adult Health

## 2021-05-19 DIAGNOSIS — M5416 Radiculopathy, lumbar region: Secondary | ICD-10-CM | POA: Diagnosis not present

## 2021-05-19 DIAGNOSIS — Z981 Arthrodesis status: Secondary | ICD-10-CM | POA: Diagnosis not present

## 2021-05-19 DIAGNOSIS — S32009K Unspecified fracture of unspecified lumbar vertebra, subsequent encounter for fracture with nonunion: Secondary | ICD-10-CM | POA: Diagnosis not present

## 2021-05-19 DIAGNOSIS — M5442 Lumbago with sciatica, left side: Secondary | ICD-10-CM | POA: Diagnosis not present

## 2021-05-19 DIAGNOSIS — M4120 Other idiopathic scoliosis, site unspecified: Secondary | ICD-10-CM | POA: Diagnosis not present

## 2021-05-19 DIAGNOSIS — F419 Anxiety disorder, unspecified: Secondary | ICD-10-CM

## 2021-05-19 DIAGNOSIS — M47816 Spondylosis without myelopathy or radiculopathy, lumbar region: Secondary | ICD-10-CM | POA: Diagnosis not present

## 2021-05-26 ENCOUNTER — Other Ambulatory Visit: Payer: Self-pay | Admitting: Adult Health

## 2021-05-26 DIAGNOSIS — F419 Anxiety disorder, unspecified: Secondary | ICD-10-CM

## 2021-05-26 NOTE — Telephone Encounter (Signed)
I refused last week bc the note stated it was d/c. Please advise if pt is still on Rx.

## 2021-05-30 DIAGNOSIS — C140 Malignant neoplasm of pharynx, unspecified: Secondary | ICD-10-CM | POA: Diagnosis not present

## 2021-05-30 DIAGNOSIS — M150 Primary generalized (osteo)arthritis: Secondary | ICD-10-CM | POA: Diagnosis not present

## 2021-05-30 DIAGNOSIS — M5030 Other cervical disc degeneration, unspecified cervical region: Secondary | ICD-10-CM | POA: Diagnosis not present

## 2021-05-30 DIAGNOSIS — M1A09X0 Idiopathic chronic gout, multiple sites, without tophus (tophi): Secondary | ICD-10-CM | POA: Diagnosis not present

## 2021-05-30 DIAGNOSIS — R051 Acute cough: Secondary | ICD-10-CM | POA: Diagnosis not present

## 2021-05-30 DIAGNOSIS — E663 Overweight: Secondary | ICD-10-CM | POA: Diagnosis not present

## 2021-05-30 DIAGNOSIS — Z6825 Body mass index (BMI) 25.0-25.9, adult: Secondary | ICD-10-CM | POA: Diagnosis not present

## 2021-06-14 NOTE — Progress Notes (Signed)
Oncology Nurse Navigator Documentation   Per patient's 04/15/21  post-treatment follow-up with Dr. Isidore Moos, sent fax to [facility] E[facility] Scheduling with request Mr. Goodwyn be contacted and scheduled for routine post-RT follow-up in March 2023 with Dr. Constance Holster or Dr. Fredric Dine.  His previous ENT Dr. Loletha Grayer. Lucia Gaskins retired in October 2022 and he continues to need to be followed by ENT for his previous head and neck cancer. Notification of successful fax transmission received.   Harlow Asa RN, BSN, OCN Head & Neck Oncology Nurse [facility] at [facility] Phone # [phone]  Fax # [phone]

## 2021-07-14 DIAGNOSIS — H2513 Age-related nuclear cataract, bilateral: Secondary | ICD-10-CM | POA: Diagnosis not present

## 2021-07-20 DIAGNOSIS — S32009K Unspecified fracture of unspecified lumbar vertebra, subsequent encounter for fracture with nonunion: Secondary | ICD-10-CM | POA: Diagnosis not present

## 2021-07-20 DIAGNOSIS — M5416 Radiculopathy, lumbar region: Secondary | ICD-10-CM | POA: Diagnosis not present

## 2021-07-27 ENCOUNTER — Ambulatory Visit (INDEPENDENT_AMBULATORY_CARE_PROVIDER_SITE_OTHER): Payer: No Typology Code available for payment source | Admitting: Otolaryngology

## 2021-08-12 DIAGNOSIS — M25552 Pain in left hip: Secondary | ICD-10-CM | POA: Diagnosis not present

## 2021-08-24 DIAGNOSIS — M25552 Pain in left hip: Secondary | ICD-10-CM | POA: Diagnosis not present

## 2021-08-31 DIAGNOSIS — M25552 Pain in left hip: Secondary | ICD-10-CM | POA: Diagnosis not present

## 2021-08-31 DIAGNOSIS — M1612 Unilateral primary osteoarthritis, left hip: Secondary | ICD-10-CM | POA: Diagnosis not present

## 2021-09-07 NOTE — Patient Instructions (Addendum)
DUE TO COVID-19 ONLY ONE VISITOR IS ALLOWED TO COME WITH YOU AND STAY IN THE WAITING ROOM ONLY DURING PRE OP AND PROCEDURE.   **NO VISITORS ARE ALLOWED IN THE SHORT STAY AREA OR RECOVERY ROOM!!**  IF YOU WILL BE ADMITTED INTO THE HOSPITAL YOU ARE ALLOWED ONLY TWO SUPPORT PEOPLE DURING VISITATION HOURS ONLY (7 AM -8PM)   The support person(s) must pass our screening, gel in and out, and wear a mask at all times, including in the patients room. Patients must also wear a mask when staff or their support person are in the room. Visitors GUEST BADGE MUST BE WORN VISIBLY  One adult visitor may remain with you overnight and MUST be in the room by 8 P.M.  No visitors under the age of 57. Any visitor under the age of 23 must be accompanied by an adult.    COVID SWAB TESTING MUST BE COMPLETED ON:  09/19/21 @ 8:00 AM   Site: [facility] [place] Enter: Main Entrance have a seat in the waiting area to the right of main entrance (DO NOT Sophia!!!!!) Dial: [phone] to alert staff you have arrived  You are not required to quarantine, however you are required to wear a well-fitted mask when you are out and around people not in your household.  Hand Hygiene often Do NOT share personal items Notify your provider if you are in close contact with someone who has COVID or you develop fever 100.4 or greater, new onset of sneezing, cough, sore throat, shortness of breath or body aches.       Your procedure is scheduled on: 09/21/21   Report to [facility] Main Entrance    Report to admitting at 2:00 PM   Call this number if you have problems the morning of surgery [phone]   Do not eat food :After Midnight.   May have liquids until 1:40 PM day of surgery  CLEAR LIQUID DIET  Foods Allowed                                                                     Foods Excluded  Water, Black Coffee and tea, regular and decaf                              liquids that you cannot  Plain Jell-O in any flavor  (No red)                                           see through such as: Fruit ices (not with fruit pulp)                                     milk, soups, orange juice              Iced Popsicles (No red)  All solid food                                   Apple juices Sports drinks like Gatorade (No red) Lightly seasoned clear broth or consume(fat free) Sugar     The day of surgery:  Drink ONE (1) Pre-Surgery Clear Ensure at 1:40 PM the morning of surgery. Drink in one sitting. Do not sip.  This drink was given to you during your hospital  pre-op appointment visit. Nothing else to drink after completing the  Pre-Surgery Clear Ensure          If you have questions, please contact your surgeons office.  FOLLOW BOWEL PREP AND ANY ADDITIONAL PRE OP INSTRUCTIONS YOU RECEIVED FROM YOUR SURGEON'S OFFICE!!!     Oral Hygiene is also important to reduce your risk of infection.                                    Remember - BRUSH YOUR TEETH THE MORNING OF SURGERY WITH YOUR REGULAR TOOTHPASTE   Stop all vitamins and supplements 7 days before surgery   Take these medicines the morning of surgery with A SIP OF WATER: Inhalers, Gabapentin, Allopurinol, Atorvastatin, Diltiazem, Omeprazole, Sertraline                               You may not have any metal on your body including jewelry, and body piercing             Do not wear lotions, powders, cologne, or deodorant              Men may shave face and neck.   Do not bring valuables to the hospital. [place].   Bring small overnight bag day of surgery.              Please read over the following fact sheets you were given: IF YOU HAVE QUESTIONS ABOUT YOUR PRE-OP INSTRUCTIONS PLEASE CALL [facility] - Preparing for Surgery Before surgery, you can play an important role.   Because skin is not sterile, your skin needs to be as free of germs as possible.  You can reduce the number of germs on your skin by washing with CHG (chlorahexidine gluconate) soap before surgery.  CHG is an antiseptic cleaner which kills germs and bonds with the skin to continue killing germs even after washing. Please DO NOT use if you have an allergy to CHG or antibacterial soaps.  If your skin becomes reddened/irritated stop using the CHG and inform your nurse when you arrive at Short Stay. Do not shave (including legs and underarms) for at least 48 hours prior to the first CHG shower.  You may shave your face/neck.  Please follow these instructions carefully:  1.  Shower with CHG Soap the night before surgery and the  morning of surgery.  2.  If you choose to wash your hair, wash your hair first as usual with your normal  shampoo.  3.  After you shampoo, rinse your hair and body thoroughly to remove the shampoo.  4.  Use CHG as you would any other liquid soap.  You can apply chg directly to the skin and wash.  Gently with a scrungie or clean washcloth.  5.  Apply the CHG Soap to your body ONLY FROM THE NECK DOWN.   Do   not use on face/ open                           Wound or open sores. Avoid contact with eyes, ears mouth and   genitals (private parts).                       Wash face,  Genitals (private parts) with your normal soap.             6.  Wash thoroughly, paying special attention to the area where your    surgery  will be performed.  7.  Thoroughly rinse your body with warm water from the neck down.  8.  DO NOT shower/wash with your normal soap after using and rinsing off the CHG Soap.                9.  Pat yourself dry with a clean towel.            10.  Wear clean pajamas.            11.  Place clean sheets on your bed the night of your first shower and do not  sleep with pets. Day of Surgery : Do not apply any lotions/deodorants the morning of  surgery.  Please wear clean clothes to the hospital/surgery center.  FAILURE TO FOLLOW THESE INSTRUCTIONS MAY RESULT IN THE CANCELLATION OF YOUR SURGERY  PATIENT SIGNATURE_________________________________  NURSE SIGNATURE__________________________________  ________________________________________________________________________   Aaron Black  An incentive spirometer is a tool that can help keep your lungs clear and active. This tool measures how well you are filling your lungs with each breath. Taking long deep breaths may help reverse or decrease the chance of developing breathing (pulmonary) problems (especially infection) following: A long period of time when you are unable to move or be active. BEFORE THE PROCEDURE  If the spirometer includes an indicator to show your best effort, your nurse or respiratory therapist will set it to a desired goal. If possible, sit up straight or lean slightly forward. Try not to slouch. Hold the incentive spirometer in an upright position. INSTRUCTIONS FOR USE  Sit on the edge of your bed if possible, or sit up as far as you can in bed or on a chair. Hold the incentive spirometer in an upright position. Breathe out normally. Place the mouthpiece in your mouth and seal your lips tightly around it. Breathe in slowly and as deeply as possible, raising the piston or the ball toward the top of the column. Hold your breath for 3-5 seconds or for as long as possible. Allow the piston or ball to fall to the bottom of the column. Remove the mouthpiece from your mouth and breathe out normally. Rest for a few seconds and repeat Steps 1 through 7 at least 10 times every 1-2 hours when you are awake. Take your time and take a few normal breaths between deep breaths. The spirometer may include an indicator to show your best effort. Use the indicator as a goal to work toward during each repetition. After each set of 10 deep breaths, practice coughing to be  sure  your lungs are clear. If you have an incision (the cut made at the time of surgery), support your incision when coughing by placing a pillow or rolled up towels firmly against it. Once you are able to get out of bed, walk around indoors and cough well. You may stop using the incentive spirometer when instructed by your caregiver.  RISKS AND COMPLICATIONS Take your time so you do not get dizzy or light-headed. If you are in pain, you may need to take or ask for pain medication before doing incentive spirometry. It is harder to take a deep breath if you are having pain. AFTER USE Rest and breathe slowly and easily. It can be helpful to keep track of a log of your progress. Your caregiver can provide you with a simple table to help with this. If you are using the spirometer at home, follow these instructions: [place] IF:  You are having difficultly using the spirometer. You have trouble using the spirometer as often as instructed. Your pain medication is not giving enough relief while using the spirometer. You develop fever of 100.5 F (38.1 C) or higher. SEEK IMMEDIATE MEDICAL CARE IF:  You cough up bloody sputum that had not been present before. You develop fever of 102 F (38.9 C) or greater. You develop worsening pain at or near the incision site. MAKE SURE YOU:  Understand these instructions. Will watch your condition. Will get help right away if you are not doing well or get worse. Document Released: 11/20/2006 Document Revised: 10/02/2011 Document Reviewed: 01/21/2007 Exit[employer] Patient Information 2014 [employer], [place].   ________________________________________________________________________  WHAT IS A BLOOD TRANSFUSION? Blood Transfusion Information  A transfusion is the replacement of blood or some of its parts. Blood is made up of multiple cells which provide different functions. Red blood cells carry oxygen and are used for blood loss replacement. White blood  cells fight against infection. Platelets control bleeding. Plasma helps clot blood. Other blood products are available for specialized needs, such as hemophilia or other clotting disorders. BEFORE THE TRANSFUSION  Who gives blood for transfusions?  Healthy volunteers who are fully evaluated to make sure their blood is safe. This is blood bank blood. Transfusion therapy is the safest it has ever been in the practice of medicine. Before blood is taken from a donor, a complete history is taken to make sure that person has no history of diseases nor engages in risky social behavior (examples are intravenous drug use or sexual activity with multiple partners). The donor's travel history is screened to minimize risk of transmitting infections, such as malaria. The donated blood is tested for signs of infectious diseases, such as HIV and hepatitis. The blood is then tested to be sure it is compatible with you in order to minimize the chance of a transfusion reaction. If you or a relative donates blood, this is often done in anticipation of surgery and is not appropriate for emergency situations. It takes many days to process the donated blood. RISKS AND COMPLICATIONS Although transfusion therapy is very safe and saves many lives, the main dangers of transfusion include:  Getting an infectious disease. Developing a transfusion reaction. This is an allergic reaction to something in the blood you were given. Every precaution is taken to prevent this. The decision to have a blood transfusion has been considered carefully by your caregiver before blood is given. Blood is not given unless the benefits outweigh the risks. AFTER THE TRANSFUSION Right after receiving a blood transfusion,  you will usually feel much better and more energetic. This is especially true if your red blood cells have gotten low (anemic). The transfusion raises the level of the red blood cells which carry oxygen, and this usually causes an  energy increase. The nurse administering the transfusion will monitor you carefully for complications. HOME CARE INSTRUCTIONS  No special instructions are needed after a transfusion. You may find your energy is better. Speak with your caregiver about any limitations on activity for underlying diseases you may have. SEEK MEDICAL CARE IF:  Your condition is not improving after your transfusion. You develop redness or irritation at the intravenous (IV) site. SEEK IMMEDIATE MEDICAL CARE IF:  Any of the following symptoms occur over the next 12 hours: Shaking chills. You have a temperature by mouth above 102 F (38.9 C), not controlled by medicine. Chest, back, or muscle pain. People around you feel you are not acting correctly or are confused. Shortness of breath or difficulty breathing. Dizziness and fainting. You get a rash or develop hives. You have a decrease in urine output. Your urine turns a dark color or changes to pink, red, or brown. Any of the following symptoms occur over the next 10 days: You have a temperature by mouth above 102 F (38.9 C), not controlled by medicine. Shortness of breath. Weakness after normal activity. The white part of the eye turns yellow (jaundice). You have a decrease in the amount of urine or are urinating less often. Your urine turns a dark color or changes to pink, red, or brown. Document Released: 07/07/2000 Document Revised: 10/02/2011 Document Reviewed: 02/24/2008 [facility] Patient Information 2014 [place], [place].  _______________________________________________________________________

## 2021-09-07 NOTE — Progress Notes (Addendum)
COVID swab appointment: 09/19/21 @ 0800  COVID Vaccine Completed: yes x3 Date COVID Vaccine completed: 11/07/19, 12/01/19 Has received booster: 03/24/20 COVID vaccine manufacturer: Pfizer      Date of COVID positive in last 90 days: no  PCP -  Dorothyann Peng, NP Cardiologist - n/a  Chest x-ray - n/a EKG - 09/08/21 Epic/chart Stress Test - 02/27/13 Epic ECHO - 02/26/13 Epic Cardiac Cath - n/a Pacemaker/ICD device last checked: n/a Spinal Cord Stimulator: n/a  Bowel Prep - no  Sleep Study - yes, negative  CPAP -   Fasting Blood Sugar - n/a Checks Blood Sugar _____ times a day  Blood Thinner Instructions: n/a Aspirin Instructions: Last Dose:  Activity level: Can perform activities of daily living without stopping and without symptoms of chest pain or shortness of breath. Difficulty with stairs due to hip     Anesthesia review:   Patient denies shortness of breath, fever, cough and chest pain at PAT appointment   Patient verbalized understanding of instructions that were given to them at the PAT appointment. Patient was also instructed that they will need to review over the PAT instructions again at home before surgery.

## 2021-09-08 ENCOUNTER — Encounter (HOSPITAL_COMMUNITY)
Admission: RE | Admit: 2021-09-08 | Discharge: 2021-09-08 | Disposition: A | Discharge status: Home / Self Care | Payer: 59 | Source: Ambulatory Visit | Attending: Orthopedic Surgery | Admitting: Orthopedic Surgery

## 2021-09-08 ENCOUNTER — Other Ambulatory Visit: Payer: Self-pay

## 2021-09-08 ENCOUNTER — Encounter (HOSPITAL_COMMUNITY): Payer: Self-pay

## 2021-09-08 VITALS — BP 169/93 | HR 78 | Temp 97.8°F | Resp 16 | Ht 68.0 in | Wt 173.6 lb

## 2021-09-08 DIAGNOSIS — Z01818 Encounter for other preprocedural examination: Secondary | ICD-10-CM | POA: Diagnosis present

## 2021-09-08 DIAGNOSIS — M1612 Unilateral primary osteoarthritis, left hip: Secondary | ICD-10-CM

## 2021-09-08 LAB — COMPREHENSIVE METABOLIC PANEL
ALT: 20 U/L (ref 0–44)
AST: 23 U/L (ref 15–41)
Albumin: 4.5 g/dL (ref 3.5–5.0)
Alkaline Phosphatase: 101 U/L (ref 38–126)
Anion gap: 8 (ref 5–15)
BUN: 14 mg/dL (ref 8–23)
CO2: 26 mmol/L (ref 22–32)
Calcium: 9.3 mg/dL (ref 8.9–10.3)
Chloride: 100 mmol/L (ref 98–111)
Creatinine, Ser: 0.87 mg/dL (ref 0.61–1.24)
GFR, Estimated: >60 mL/min (ref >60)
Glucose, Bld: 94 mg/dL (ref 70–99)
Potassium: 4.3 mmol/L (ref 3.5–5.1)
Sodium: 134 mmol/L — ABNORMAL LOW (ref 135–145)
Total Bilirubin: 0.4 mg/dL (ref 0.3–1.2)
Total Protein: 8.1 g/dL (ref 6.5–8.1)

## 2021-09-08 LAB — CBC
HCT: 36.8 % — ABNORMAL LOW (ref 39.0–52.0)
Hemoglobin: 12.3 g/dL — ABNORMAL LOW (ref 13.0–17.0)
MCH: 30.4 pg (ref 26.0–34.0)
MCHC: 33.4 g/dL (ref 30.0–36.0)
MCV: 91.1 fL (ref 80.0–100.0)
Platelets: 272 10*3/uL (ref 150–400)
RBC: 4.04 MIL/uL — ABNORMAL LOW (ref 4.22–5.81)
RDW: 13.5 % (ref 11.5–15.5)
WBC: 6.1 10*3/uL (ref 4.0–10.5)
nRBC: 0 % (ref 0.0–0.2)

## 2021-09-08 LAB — TYPE AND SCREEN
ABO/RH(D): O POS
Antibody Screen: NEGATIVE

## 2021-09-08 LAB — PROTIME-INR
INR: 1 (ref 0.8–1.2)
Prothrombin Time: 12.9 seconds (ref 11.4–15.2)

## 2021-09-08 LAB — SURGICAL PCR SCREEN
MRSA, PCR: NEGATIVE
Staphylococcus aureus: NEGATIVE

## 2021-09-19 ENCOUNTER — Other Ambulatory Visit: Payer: Self-pay

## 2021-09-19 ENCOUNTER — Encounter (HOSPITAL_COMMUNITY)
Admission: RE | Admit: 2021-09-19 | Discharge: 2021-09-19 | Disposition: A | Discharge status: Home / Self Care | Payer: 59 | Source: Ambulatory Visit | Attending: Orthopedic Surgery | Admitting: Orthopedic Surgery

## 2021-09-19 DIAGNOSIS — Z20822 Contact with and (suspected) exposure to covid-19: Secondary | ICD-10-CM | POA: Insufficient documentation

## 2021-09-19 DIAGNOSIS — Z01812 Encounter for preprocedural laboratory examination: Secondary | ICD-10-CM | POA: Insufficient documentation

## 2021-09-19 DIAGNOSIS — Z01818 Encounter for other preprocedural examination: Secondary | ICD-10-CM

## 2021-09-19 LAB — SARS CORONAVIRUS 2 (TAT 6-24 HRS): SARS Coronavirus 2: NEGATIVE

## 2021-09-20 NOTE — H&P (Signed)
TOTAL HIP ADMISSION H&P  Patient is admitted for left total hip arthroplasty.  Subjective:  Chief Complaint: Left hip pain  HPI: Aaron Black, 66 y.o. male, has a history of pain and functional disability in the left hip due to arthritis and patient has failed non-surgical conservative treatments for greater than 12 weeks to include NSAID's and/or analgesics, flexibility and strengthening excercises, and activity modification. Onset of symptoms was gradual, starting  several  years ago with gradually worsening course since that time. The patient noted no past surgery on the left hip. Patient currently rates pain in the left hip at 7 out of 10 with activity. Patient has worsening of pain with activity and weight bearing, pain that interfers with activities of daily living, and pain with passive range of motion. Patient has evidence of periarticular osteophytes and joint space narrowing by imaging studies. This condition presents safety issues increasing the risk of falls. There is no current active infection.  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   Pain of left hip joint 09/13/2018   Encounter for tobacco use cessation counseling 11/01/2017   Lumbar pseudoarthrosis 01/17/2016   Neck pain 07/01/2014   Spondylolisthesis 04/27/2014   Obesity 08/11/2013   Lumbago-sciatica due to displacement of lumbar intervertebral disc 07/07/2013   Lumbar spondylosis 07/07/2013   Lumbosacral radiculitis 07/07/2013   Tobacco abuse 09/25/2011   Gout 05/27/2009   VITAMIN B12 DEFICIENCY 09/08/2008   HLD (hyperlipidemia) 09/08/2008   COLONIC POLYPS, HX OF 09/08/2008   Anemia, unspecified 06/30/2008   Anxiety disorder 06/29/2008   Essential (primary)  hypertension 06/29/2008   GERD 06/29/2008   Diverticulosis of colon 06/29/2008   ARTHRITIS 06/29/2008   HERNIATED DISC 06/29/2008    Past Medical History:  Diagnosis Date   Anemia    Anxiety    Arm pain    Arthritis    B12 deficiency    Back pain    Cancer (HCC)    throat cancer    Cellulitis of arm, left 10/2017   after cat scratch   Cervical disc disease    a. 07/2003 ant cervical deomcpression and fusion C5-6/C6-7;  b. 09/2007 post cervical laminectomy C4-5 with scres and arthrodesis C4-C7.   Chest pain at rest 02/25/2013   Chronic lower back pain    Complication of anesthesia    " i WOKE UP DURING A COLONOSCOPY "- 2008   Depression    Diverticulosis of colon    GERD (gastroesophageal reflux disease)    Gout    Hyperlipidemia    Hypertension    Joint pain    Leg pain    While walking   Leg weakness    Neck pain    Pleurisy    Shortness of breath dyspnea    with exertion- "I cant exercise now"   Tobacco abuse    a. 40 yrs, 1.5-3 ppd over that time.    Past Surgical History:  Procedure Laterality Date   ABDOMINAL EXPOSURE N/A 01/03/2021   Procedure: ABDOMINAL EXPOSURE;  Surgeon: [name], MD;  Location: [facility];  Service: Vascular;  Laterality: N/A;   ANTERIOR LAT LUMBAR FUSION Left 01/04/2021   Procedure: Removal Of Broken Hardware;  Surgeon: Erline Levine, MD;  Location: [facility];  Service: Neurosurgery;  Laterality: Left;   ANTERIOR LATERAL LUMBAR FUSION 4 LEVELS Right 06/05/2014   Procedure: ANTERIOR LATERAL LUMBAR FUSION  LUMBAR ONE TO LUMBAR FIVE with Percutaneous Pedicle Screws;  Surgeon: Erline Levine, MD;  Location: [facility];  Service: Neurosurgery;  Laterality: Right;   ANTERIOR LUMBAR FUSION N/A 01/03/2021   Procedure: Lumbar Four-Five Anterior lumbar interbody fusion with revision of graft;  Surgeon: Erline Levine, MD;  Location: [facility];  Service: Neurosurgery;  Laterality: N/A;  Lumbar Four-Five Anterior lumbar interbody fusion with revision of  graft   CARPAL TUNNEL RELEASE Right    CERVICAL DISC SURGERY     X 2   COLONOSCOPY     ELBOW ARTHROSCOPY Right    KNEE ARTHROSCOPY Right 08/2013   torn menicus   KNEE ARTHROSCOPY Left 11/2013   chip [place],torn menicus   LUMBAR LAMINECTOMY/DECOMPRESSION MICRODISCECTOMY Right 07/11/2013   Procedure: LUMBAR LAMINECTOMY/DECOMPRESSION MICRODISCECTOMY 1 LEVEL;  Surgeon: Erline Levine, MD;  Location: [facility];  Service: Neurosurgery;  Laterality: Right;  Right L34 microdiskectomy   LUMBAR PERCUTANEOUS PEDICLE SCREW 4 LEVEL N/A 06/05/2014   Procedure: LUMBAR PERCUTANEOUS PEDICLE SCREW 4 LEVEL;  Surgeon: Erline Levine, MD;  Location: [facility];  Service: Neurosurgery;  Laterality: N/A;   MICROLARYNGOSCOPY Left 05/12/2019   Procedure: MICROLARYNGOSCOPY WITH BIOPSY;  Surgeon: Rozetta Nunnery, MD;  Location: [facility];  Service: ENT;  Laterality: Left;   NECK SURGERY  January 2005, March 2009   C-Spine   POLYPECTOMY     ROTATOR CUFF REPAIR  August 2000, January 2002, July 2008, July 2009   2 left 2 right    Prior to Admission medications   Medication Sig Start Date End Date Taking? Authorizing Provider  albuterol (VENTOLIN HFA) 108 (90 Base) MCG/ACT inhaler Inhale 1 puff into the lungs every 4 (four) hours as needed for wheezing or shortness of breath. 06/26/21  Yes [provider]  allopurinol (ZYLOPRIM) 100 MG tablet Take 1 tablet (100 mg total) by mouth daily. Patient taking differently: Take 100 mg by mouth in the morning. 12/02/20  Yes Nafziger, Tommi Rumps, NP  atorvastatin (LIPITOR) 80 MG tablet Take 1 tablet (80 mg total) by mouth daily. Patient taking differently: Take 80 mg by mouth in the morning. 12/02/20  Yes Nafziger, Tommi Rumps, NP  colchicine 0.6 MG tablet TAKE 1 TABLET (0.6 MG TOTAL) BY MOUTH DAILY AS NEEDED (GOUT). 03/20/18  Yes Marletta Lor, MD  cyclobenzaprine (FLEXERIL) 10 MG tablet Take 10 mg by mouth in the morning and at bedtime. 12/27/18  Yes  [provider]  diltiazem (CARDIZEM CD) 360 MG 24 hr capsule TAKE 1 CAPSULE BY MOUTH EVERY DAY Patient taking differently: Take 360 mg by mouth in the morning. 12/02/20  Yes Nafziger, Tommi Rumps, NP  gabapentin (NEURONTIN) 300 MG capsule Take 300 mg by mouth in the morning, at noon, in the evening, and at bedtime. 03/28/21  Yes [provider]  losartan (COZAAR) 100 MG tablet Take 1 tablet (100 mg total) by mouth daily. Patient taking differently: Take 100 mg by mouth in the morning. 12/02/20 09/06/21 Yes Nafziger, Tommi Rumps, NP  omeprazole (PRILOSEC) 40 MG capsule Take 1 capsule (40 mg total) by mouth daily. Patient taking differently: Take 40 mg by mouth in the morning. 12/02/20  Yes Nafziger, Tommi Rumps, NP  oxyCODONE-acetaminophen (PERCOCET) 10-325 MG tablet Take 1 tablet by mouth every 4 (four) hours as needed for pain. 08/11/21  Yes [provider]  sertraline (ZOLOFT) 100 MG tablet  Take 1 tablet (100 mg total) by mouth in the morning. Patient taking differently: Take 50 mg by mouth in the morning. 05/26/21  Yes Nafziger, Tommi Rumps, NP  oxyCODONE-acetaminophen (PERCOCET) 5-325 MG tablet Take 1-2 tablets by mouth every 6 (six) hours as needed for severe pain. Patient not taking: Reported on 09/06/2021 01/05/21 01/05/22  Marvis Moeller, NP  sertraline (ZOLOFT) 50 MG tablet TAKE 1 TABLET BY MOUTH EVERY DAY Patient not taking: Reported on 09/06/2021 05/19/21   Dorothyann Peng, NP  vitamin B-12 (CYANOCOBALAMIN) 1000 MCG tablet Take 1,000 mcg by mouth in the morning.    [provider]    Allergies  Allergen Reactions   Lisinopril Swelling    Swollen Lip   Percodan [Oxycodone-Aspirin] Rash    Percodan caused rash---but he can tolerate Percocet and aspirin on their own     Social History   Socioeconomic History   Marital status: Married    Spouse name: Not on file   Number of children: Not on file   Years of education: Not on file   Highest education level: Not on file   Occupational History   Not on file  Tobacco Use   Smoking status: Former    Packs/day: 1.00    Years: 42.00    Pack years: 42.00    Types: Cigarettes    Start date: 86    Quit date: 06/08/2019    Years since quitting: 2.2   Smokeless tobacco: Never   Tobacco comments:    He is smoking about 4-5 cigarettes, He had smoked up to 3 packs daily. 06/03/19  Vaping Use   Vaping Use: Never used  Substance and Sexual Activity   Alcohol use: Yes    Comment: occasional   Drug use: No   Sexual activity: Not on file  Other Topics Concern   Not on file  Social History Narrative   Lives in [place] with wife.  Does not work (retired) Previously read [employer] for [employer] [place].   Social Determinants of Health   Financial Resource Strain: Not on file  Food Insecurity: Not on file  Transportation Needs: Not on file  Physical Activity: Not on file  Stress: Not on file  Social Connections: Not on file  Intimate Partner Violence: Not on file    Tobacco Use: Medium Risk   Smoking Tobacco Use: Former   Smokeless Tobacco Use: Never   Passive Exposure: Not on file   Social History   Substance and Sexual Activity  Alcohol Use Yes   Comment: occasional    Family History  Problem Relation Age of Onset   Bone cancer Mother    Squamous cell carcinoma Mother        died @ 61   Heart attack Mother    Hypertension Father    Diabetes Father        borderline   Congestive Heart Failure Father        alive @ 29   Heart failure Father    Sudden death Brother        died @ 14   Other Brother        died in [place] @ 39 - struck by drunk driver   Other Brother        died in [place] @ 20 - struck by drunk driver   Heart attack Brother    Stroke Paternal Grandfather    Colon cancer Neg Hx    Esophageal cancer Neg Hx  Rectal cancer Neg Hx    Stomach cancer Neg Hx    Colon polyps Neg Hx     ROS: Constitutional: no fever, no chills, no night sweats, no significant weight  loss Cardiovascular: no chest pain, no palpitations Respiratory: no cough, no shortness of breath, No COPD Gastrointestinal: no vomiting, no nausea Musculoskeletal: no swelling in Joints, Joint Pain Neurologic: no numbness, no tingling, no difficulty with balance    Objective:  Physical Exam: Well nourished and well developed.  General: Alert and oriented x3, cooperative and pleasant, no acute distress.  Head: normocephalic, atraumatic, neck supple.  Eyes: EOMI.  Respiratory: breath sounds clear in all fields, no wheezing, rales, or rhonchi. Cardiovascular: Regular rate and rhythm, no murmurs, gallops or rubs.  Abdomen: non-tender to palpation and soft, normoactive bowel sounds. Musculoskeletal:  Right Hip Exam:   The range of motion: normal without discomfort.     Left Hip Exam:   The range of motion: Flexion to 90 degrees, Internal Rotation to 0 degrees, External Rotation to 10 to 20 degrees, and abduction to 20 degrees without discomfort.   There is no tenderness over the greater trochanteric bursa.       Left Knee Exam:   No effusion present. No swelling present.   The range of motion is: 0 to 125 degrees.   No crepitus on range of motion of the knee.   No medial joint line tenderness.   No lateral joint line tenderness.   The knee is stable.  Calves soft and nontender. Motor function intact in LE. Strength 5/5 LE bilaterally. Neuro: Distal pulses 2+. Sensation to light touch intact in LE.    Vital signs in last 24 hours:    Imaging Review  Radiographs - AP pelvis, AP and lateral of the left hip dated 07/2021 demonstrate significant osteonecrosis of the femoral head with what appears to be a small amount of flattening of the femoral head. He has secondary degenerative changes also.     These x-rays were ordered, reviewed, and interpreted independently and were discussed with the patient.     MRI of the left hip demonstrates osteonecrosis involving approximately  80% to 90% of his femoral head with cystic changes and collapse. He also has a small effusion. He does have secondary degenerative changes.   Assessment/Plan:  End stage arthritis, left hip  The patient history, physical examination, clinical judgement of the provider and imaging studies are consistent with end stage degenerative joint disease of the left hip and total hip arthroplasty is deemed medically necessary. The treatment options including medical management, injection therapy, arthroscopy and arthroplasty were discussed at length. The risks and benefits of total hip arthroplasty were presented and reviewed. The risks due to aseptic loosening, infection, stiffness, dislocation/subluxation, thromboembolic complications and other imponderables were discussed. The patient acknowledged the explanation, agreed to proceed with the plan and consent was signed. Patient is being admitted for inpatient treatment for surgery, pain control, PT, OT, prophylactic antibiotics, VTE prophylaxis, progressive ambulation and ADLs and discharge planning.The patient is planning to be discharged  home .   Patient's anticipated LOS is less than 2 midnights, meeting these requirements: - Younger than 22 - Lives within 1 hour of care - Has a competent adult at home to recover with post-op recover - NO history of  - Chronic pain requiring opiods  - Diabetes  - Coronary Artery Disease  - Heart failure  - Heart attack  - Stroke  - DVT/VTE  - Cardiac arrhythmia  -  Respiratory Failure/COPD  - Renal failure  - Anemia  - Advanced Liver disease    Therapy Plans: HEP Disposition: Home with Wife Planned DVT Prophylaxis: Aspirin 325mg  DME Needed: None PCP: Cory Napzinger (clearance received) TXA: IV Allergies: Lisinopril (facial swelling) Anesthesia Concerns: None BMI: 26.8 Last HgbA1c: n/a  Pharmacy: [facility]  - Patient was instructed on what medications to stop prior to surgery. -  Follow-up visit in 2 weeks with Dr. Wynelle Link - Begin physical therapy following surgery - Pre-operative lab work as pre-surgical testing - Prescriptions will be provided in hospital at time of discharge  Fenton Foy, [facility], PA-C Orthopedic Surgery [facility] [place]

## 2021-09-20 NOTE — Progress Notes (Signed)
Pt aware of surgical time change for scheduled surgery on September 21, 2021. Pt aware to arrive at [facility] admitting at 11 am. No food after midnight; clear liquids from midnight till 10:30 am consuming entire pre surgery drink by 10:30 am then nothing by mouth.

## 2021-09-21 ENCOUNTER — Inpatient Hospital Stay (HOSPITAL_COMMUNITY): Payer: 59

## 2021-09-21 ENCOUNTER — Encounter (HOSPITAL_COMMUNITY): Payer: Self-pay | Admitting: Orthopedic Surgery

## 2021-09-21 ENCOUNTER — Other Ambulatory Visit: Payer: Self-pay

## 2021-09-21 ENCOUNTER — Encounter (HOSPITAL_COMMUNITY)
Admission: RE | Disposition: A | Discharge status: Home / Self Care | Payer: Self-pay | Source: Ambulatory Visit | Attending: Orthopedic Surgery

## 2021-09-21 ENCOUNTER — Inpatient Hospital Stay (HOSPITAL_COMMUNITY): Payer: 59 | Admitting: Registered Nurse

## 2021-09-21 ENCOUNTER — Inpatient Hospital Stay (HOSPITAL_COMMUNITY)
Admission: RE | Admit: 2021-09-21 | Discharge: 2021-09-23 | DRG: 470 | Disposition: A | Discharge status: Home / Self Care | Payer: 59 | Attending: Orthopedic Surgery | Admitting: Orthopedic Surgery

## 2021-09-21 DIAGNOSIS — Z87891 Personal history of nicotine dependence: Secondary | ICD-10-CM

## 2021-09-21 DIAGNOSIS — Z981 Arthrodesis status: Secondary | ICD-10-CM

## 2021-09-21 DIAGNOSIS — M879 Osteonecrosis, unspecified: Secondary | ICD-10-CM | POA: Diagnosis present

## 2021-09-21 DIAGNOSIS — M109 Gout, unspecified: Secondary | ICD-10-CM | POA: Diagnosis present

## 2021-09-21 DIAGNOSIS — Z96642 Presence of left artificial hip joint: Secondary | ICD-10-CM | POA: Diagnosis not present

## 2021-09-21 DIAGNOSIS — F32A Depression, unspecified: Secondary | ICD-10-CM | POA: Diagnosis present

## 2021-09-21 DIAGNOSIS — M87852 Other osteonecrosis, left femur: Secondary | ICD-10-CM | POA: Diagnosis not present

## 2021-09-21 DIAGNOSIS — Z823 Family history of stroke: Secondary | ICD-10-CM

## 2021-09-21 DIAGNOSIS — Z20822 Contact with and (suspected) exposure to covid-19: Secondary | ICD-10-CM | POA: Diagnosis present

## 2021-09-21 DIAGNOSIS — Z96649 Presence of unspecified artificial hip joint: Secondary | ICD-10-CM

## 2021-09-21 DIAGNOSIS — Z471 Aftercare following joint replacement surgery: Secondary | ICD-10-CM | POA: Diagnosis not present

## 2021-09-21 DIAGNOSIS — M1612 Unilateral primary osteoarthritis, left hip: Principal | ICD-10-CM | POA: Diagnosis present

## 2021-09-21 DIAGNOSIS — Z8249 Family history of ischemic heart disease and other diseases of the circulatory system: Secondary | ICD-10-CM

## 2021-09-21 DIAGNOSIS — K219 Gastro-esophageal reflux disease without esophagitis: Secondary | ICD-10-CM | POA: Diagnosis present

## 2021-09-21 DIAGNOSIS — M169 Osteoarthritis of hip, unspecified: Secondary | ICD-10-CM | POA: Diagnosis present

## 2021-09-21 DIAGNOSIS — E785 Hyperlipidemia, unspecified: Secondary | ICD-10-CM | POA: Diagnosis present

## 2021-09-21 HISTORY — PX: TOTAL HIP ARTHROPLASTY: SHX124

## 2021-09-21 SURGERY — ARTHROPLASTY, HIP, TOTAL, ANTERIOR APPROACH
Anesthesia: Spinal | Site: Hip | Laterality: Left

## 2021-09-21 MED ORDER — SERTRALINE HCL 50 MG PO TABS: 50.0000 mg | ORAL_TABLET | Freq: Every morning | ORAL | Status: DC

## 2021-09-21 MED ORDER — SERTRALINE HCL 50 MG PO TABS
50.0000 mg | ORAL_TABLET | Freq: Every day | ORAL | Status: DC
  Administered 2021-09-22 – 2021-09-23 (×2): 50 mg via ORAL
  Filled 2021-09-21 (×2): qty 1

## 2021-09-21 MED ORDER — ONDANSETRON HCL 4 MG/2ML IJ SOLN
INTRAMUSCULAR | Status: DC | PRN
Start: 2021-09-21 — End: 2021-09-21
  Administered 2021-09-21: 4 mg via INTRAVENOUS

## 2021-09-21 MED ORDER — ONDANSETRON HCL 4 MG/2ML IJ SOLN
INTRAMUSCULAR | Status: AC
  Filled 2021-09-21: qty 2

## 2021-09-21 MED ORDER — ALLOPURINOL 100 MG PO TABS
100.0000 mg | ORAL_TABLET | Freq: Every day | ORAL | Status: DC
  Administered 2021-09-22 – 2021-09-23 (×2): 100 mg via ORAL
  Filled 2021-09-21 (×2): qty 1

## 2021-09-21 MED ORDER — FENTANYL CITRATE (PF) 100 MCG/2ML IJ SOLN
INTRAMUSCULAR | Status: DC | PRN
  Administered 2021-09-21 (×2): 50 ug via INTRAVENOUS
  Administered 2021-09-21: 100 ug via INTRAVENOUS

## 2021-09-21 MED ORDER — POLYETHYLENE GLYCOL 3350 17 G PO PACK: 17.0000 g | PACK | Freq: Every day | ORAL | Status: DC | PRN

## 2021-09-21 MED ORDER — TRANEXAMIC ACID-NACL 1000-0.7 MG/100ML-% IV SOLN
1000.0000 mg | INTRAVENOUS | Status: AC
  Administered 2021-09-21: 1000 mg via INTRAVENOUS
  Filled 2021-09-21: qty 100

## 2021-09-21 MED ORDER — LACTATED RINGERS IV SOLN: INTRAVENOUS | Status: DC

## 2021-09-21 MED ORDER — ROCURONIUM BROMIDE 10 MG/ML (PF) SYRINGE
PREFILLED_SYRINGE | INTRAVENOUS | Status: DC | PRN
  Administered 2021-09-21 (×2): 20 mg via INTRAVENOUS
  Administered 2021-09-21: 80 mg via INTRAVENOUS

## 2021-09-21 MED ORDER — BISACODYL 10 MG RE SUPP: 10.0000 mg | Freq: Every day | RECTAL | Status: DC | PRN

## 2021-09-21 MED ORDER — ATORVASTATIN CALCIUM 80 MG PO TABS
80.0000 mg | ORAL_TABLET | Freq: Every day | ORAL | Status: DC
  Administered 2021-09-22 – 2021-09-23 (×2): 80 mg via ORAL
  Filled 2021-09-21 (×2): qty 1

## 2021-09-21 MED ORDER — FENTANYL CITRATE (PF) 100 MCG/2ML IJ SOLN
INTRAMUSCULAR | Status: AC
  Filled 2021-09-21: qty 2

## 2021-09-21 MED ORDER — BUPIVACAINE HCL (PF) 0.25 % IJ SOLN
INTRAMUSCULAR | Status: AC
  Filled 2021-09-21: qty 30

## 2021-09-21 MED ORDER — DILTIAZEM HCL ER COATED BEADS 180 MG PO CP24
360.0000 mg | ORAL_CAPSULE | Freq: Every morning | ORAL | Status: DC
  Administered 2021-09-22 – 2021-09-23 (×2): 360 mg via ORAL
  Filled 2021-09-21 (×2): qty 2

## 2021-09-21 MED ORDER — METOCLOPRAMIDE HCL 5 MG/ML IJ SOLN: 5.0000 mg | Freq: Three times a day (TID) | INTRAMUSCULAR | Status: DC | PRN

## 2021-09-21 MED ORDER — CYCLOBENZAPRINE HCL 10 MG PO TABS
10.0000 mg | ORAL_TABLET | Freq: Three times a day (TID) | ORAL | Status: DC
  Administered 2021-09-21 – 2021-09-23 (×5): 10 mg via ORAL
  Filled 2021-09-21 (×5): qty 1

## 2021-09-21 MED ORDER — ROCURONIUM BROMIDE 10 MG/ML (PF) SYRINGE
PREFILLED_SYRINGE | INTRAVENOUS | Status: AC
  Filled 2021-09-21: qty 10

## 2021-09-21 MED ORDER — OXYCODONE-ACETAMINOPHEN 10-325 MG PO TABS: 1.0000 | ORAL_TABLET | ORAL | Status: DC | PRN

## 2021-09-21 MED ORDER — OXYCODONE HCL 5 MG PO TABS
5.0000 mg | ORAL_TABLET | ORAL | Status: DC | PRN
  Administered 2021-09-21 – 2021-09-22 (×3): 5 mg via ORAL
  Filled 2021-09-21 (×4): qty 1

## 2021-09-21 MED ORDER — WATER FOR IRRIGATION, STERILE IR SOLN
Status: DC | PRN
  Administered 2021-09-21: 2000 mL

## 2021-09-21 MED ORDER — ONDANSETRON HCL 4 MG/2ML IJ SOLN: 4.0000 mg | Freq: Four times a day (QID) | INTRAMUSCULAR | Status: DC | PRN

## 2021-09-21 MED ORDER — FENTANYL CITRATE PF 50 MCG/ML IJ SOSY
PREFILLED_SYRINGE | INTRAMUSCULAR | Status: AC
  Filled 2021-09-21: qty 1

## 2021-09-21 MED ORDER — EPHEDRINE 5 MG/ML INJ
INTRAVENOUS | Status: AC
  Filled 2021-09-21: qty 5

## 2021-09-21 MED ORDER — LACTATED RINGERS IV SOLN
INTRAVENOUS | Status: DC
Start: 2021-09-21 — End: 2021-09-21

## 2021-09-21 MED ORDER — MIDAZOLAM HCL 2 MG/2ML IJ SOLN
INTRAMUSCULAR | Status: AC
  Filled 2021-09-21: qty 2

## 2021-09-21 MED ORDER — CHLORHEXIDINE GLUCONATE 0.12 % MT SOLN
15.0000 mL | Freq: Once | OROMUCOSAL | Status: AC
  Administered 2021-09-21: 15 mL via OROMUCOSAL

## 2021-09-21 MED ORDER — FENTANYL CITRATE PF 50 MCG/ML IJ SOSY
PREFILLED_SYRINGE | INTRAMUSCULAR | Status: AC
  Filled 2021-09-21: qty 2

## 2021-09-21 MED ORDER — ASPIRIN EC 325 MG PO TBEC
325.0000 mg | DELAYED_RELEASE_TABLET | Freq: Two times a day (BID) | ORAL | Status: DC
  Administered 2021-09-22 – 2021-09-23 (×3): 325 mg via ORAL
  Filled 2021-09-21 (×3): qty 1

## 2021-09-21 MED ORDER — SUGAMMADEX SODIUM 200 MG/2ML IV SOLN
INTRAVENOUS | Status: DC | PRN
  Administered 2021-09-21: 200 mg via INTRAVENOUS

## 2021-09-21 MED ORDER — PHENYLEPHRINE 40 MCG/ML (10ML) SYRINGE FOR IV PUSH (FOR BLOOD PRESSURE SUPPORT)
PREFILLED_SYRINGE | INTRAVENOUS | Status: DC | PRN
  Administered 2021-09-21: 40 ug via INTRAVENOUS
  Administered 2021-09-21: 80 ug via INTRAVENOUS

## 2021-09-21 MED ORDER — DEXAMETHASONE SODIUM PHOSPHATE 10 MG/ML IJ SOLN
10.0000 mg | Freq: Once | INTRAMUSCULAR | Status: AC
  Administered 2021-09-22: 10 mg via INTRAVENOUS
  Filled 2021-09-21: qty 1

## 2021-09-21 MED ORDER — CEFAZOLIN SODIUM-DEXTROSE 2-4 GM/100ML-% IV SOLN
2.0000 g | Freq: Four times a day (QID) | INTRAVENOUS | Status: AC
  Administered 2021-09-21 – 2021-09-22 (×2): 2 g via INTRAVENOUS
  Filled 2021-09-21 (×2): qty 100

## 2021-09-21 MED ORDER — HYDROMORPHONE HCL 1 MG/ML IJ SOLN
0.2500 mg | INTRAMUSCULAR | Status: DC | PRN
  Administered 2021-09-21 (×4): 0.5 mg via INTRAVENOUS

## 2021-09-21 MED ORDER — LOSARTAN POTASSIUM 50 MG PO TABS
100.0000 mg | ORAL_TABLET | Freq: Every day | ORAL | Status: DC
  Administered 2021-09-22 – 2021-09-23 (×2): 100 mg via ORAL
  Filled 2021-09-21 (×2): qty 2

## 2021-09-21 MED ORDER — ACETAMINOPHEN 325 MG PO TABS: 325.0000 mg | ORAL_TABLET | Freq: Four times a day (QID) | ORAL | Status: DC | PRN

## 2021-09-21 MED ORDER — PROPOFOL 500 MG/50ML IV EMUL
INTRAVENOUS | Status: DC | PRN
Start: 2021-09-21 — End: 2021-09-21
  Administered 2021-09-21: 50 ug/kg/min via INTRAVENOUS

## 2021-09-21 MED ORDER — METOCLOPRAMIDE HCL 5 MG PO TABS: 5.0000 mg | ORAL_TABLET | Freq: Three times a day (TID) | ORAL | Status: DC | PRN

## 2021-09-21 MED ORDER — MENTHOL 3 MG MT LOZG: 1.0000 | LOZENGE | OROMUCOSAL | Status: DC | PRN

## 2021-09-21 MED ORDER — HYDROMORPHONE HCL 1 MG/ML IJ SOLN
INTRAMUSCULAR | Status: DC | PRN
Start: 2021-09-21 — End: 2021-09-21
  Administered 2021-09-21 (×2): .5 mg via INTRAVENOUS

## 2021-09-21 MED ORDER — DEXAMETHASONE SODIUM PHOSPHATE 10 MG/ML IJ SOLN
INTRAMUSCULAR | Status: AC
  Filled 2021-09-21: qty 1

## 2021-09-21 MED ORDER — HYDROMORPHONE HCL 2 MG/ML IJ SOLN
INTRAMUSCULAR | Status: AC
  Filled 2021-09-21: qty 1

## 2021-09-21 MED ORDER — CEFAZOLIN SODIUM-DEXTROSE 2-4 GM/100ML-% IV SOLN
2.0000 g | INTRAVENOUS | Status: AC
  Administered 2021-09-21: 2 g via INTRAVENOUS
  Filled 2021-09-21: qty 100

## 2021-09-21 MED ORDER — PANTOPRAZOLE SODIUM 40 MG PO TBEC
40.0000 mg | DELAYED_RELEASE_TABLET | Freq: Every day | ORAL | Status: DC
  Administered 2021-09-22 – 2021-09-23 (×2): 40 mg via ORAL
  Filled 2021-09-21 (×2): qty 1

## 2021-09-21 MED ORDER — PHENYLEPHRINE 40 MCG/ML (10ML) SYRINGE FOR IV PUSH (FOR BLOOD PRESSURE SUPPORT)
PREFILLED_SYRINGE | INTRAVENOUS | Status: AC
  Filled 2021-09-21: qty 10

## 2021-09-21 MED ORDER — DEXAMETHASONE SODIUM PHOSPHATE 10 MG/ML IJ SOLN
8.0000 mg | Freq: Once | INTRAMUSCULAR | Status: AC
  Administered 2021-09-21: 10 mg via INTRAVENOUS

## 2021-09-21 MED ORDER — MIDAZOLAM HCL 5 MG/5ML IJ SOLN
INTRAMUSCULAR | Status: DC | PRN
  Administered 2021-09-21: 2 mg via INTRAVENOUS

## 2021-09-21 MED ORDER — ACETAMINOPHEN 10 MG/ML IV SOLN
1000.0000 mg | Freq: Four times a day (QID) | INTRAVENOUS | Status: DC
  Administered 2021-09-21: 1000 mg via INTRAVENOUS
  Filled 2021-09-21: qty 100

## 2021-09-21 MED ORDER — PHENOL 1.4 % MT LIQD: 1.0000 | OROMUCOSAL | Status: DC | PRN

## 2021-09-21 MED ORDER — ONDANSETRON HCL 4 MG PO TABS: 4.0000 mg | ORAL_TABLET | Freq: Four times a day (QID) | ORAL | Status: DC | PRN

## 2021-09-21 MED ORDER — SODIUM CHLORIDE 0.9 % IV SOLN: INTRAVENOUS | Status: DC

## 2021-09-21 MED ORDER — MORPHINE SULFATE (PF) 2 MG/ML IV SOLN
0.5000 mg | INTRAVENOUS | Status: DC | PRN
  Administered 2021-09-21 – 2021-09-22 (×3): 1 mg via INTRAVENOUS
  Filled 2021-09-21 (×3): qty 1

## 2021-09-21 MED ORDER — LIDOCAINE 2% (20 MG/ML) 5 ML SYRINGE
INTRAMUSCULAR | Status: DC | PRN
  Administered 2021-09-21: 60 mg via INTRAVENOUS

## 2021-09-21 MED ORDER — OXYCODONE-ACETAMINOPHEN 5-325 MG PO TABS
1.0000 | ORAL_TABLET | ORAL | Status: DC | PRN
  Administered 2021-09-21 – 2021-09-23 (×9): 1 via ORAL
  Filled 2021-09-21 (×9): qty 1

## 2021-09-21 MED ORDER — OXYCODONE HCL 5 MG PO TABS
5.0000 mg | ORAL_TABLET | ORAL | Status: DC | PRN
  Administered 2021-09-21 – 2021-09-23 (×9): 5 mg via ORAL
  Filled 2021-09-21 (×8): qty 1

## 2021-09-21 MED ORDER — ALBUTEROL SULFATE (2.5 MG/3ML) 0.083% IN NEBU: 3.0000 mL | INHALATION_SOLUTION | RESPIRATORY_TRACT | Status: DC | PRN

## 2021-09-21 MED ORDER — DOCUSATE SODIUM 100 MG PO CAPS
100.0000 mg | ORAL_CAPSULE | Freq: Two times a day (BID) | ORAL | Status: DC
  Administered 2021-09-21 – 2021-09-23 (×4): 100 mg via ORAL
  Filled 2021-09-21 (×4): qty 1

## 2021-09-21 MED ORDER — GABAPENTIN 300 MG PO CAPS
300.0000 mg | ORAL_CAPSULE | Freq: Three times a day (TID) | ORAL | Status: DC
  Administered 2021-09-21 – 2021-09-23 (×5): 300 mg via ORAL
  Filled 2021-09-21 (×5): qty 1

## 2021-09-21 MED ORDER — BUPIVACAINE HCL 0.25 % IJ SOLN
INTRAMUSCULAR | Status: DC | PRN
  Administered 2021-09-21: 30 mL

## 2021-09-21 MED ORDER — POVIDONE-IODINE 10 % EX SWAB
2.0000 "application " | Freq: Once | CUTANEOUS | Status: AC
  Administered 2021-09-21: 2 via TOPICAL

## 2021-09-21 MED ORDER — 0.9 % SODIUM CHLORIDE (POUR BTL) OPTIME
TOPICAL | Status: DC | PRN
  Administered 2021-09-21: 1000 mL

## 2021-09-21 MED ORDER — PROPOFOL 10 MG/ML IV BOLUS
INTRAVENOUS | Status: DC | PRN
  Administered 2021-09-21 (×2): 30 mg via INTRAVENOUS
  Administered 2021-09-21: 20 mg via INTRAVENOUS
  Administered 2021-09-21: 100 mg via INTRAVENOUS

## 2021-09-21 MED ORDER — HYDROMORPHONE HCL 1 MG/ML IJ SOLN
INTRAMUSCULAR | Status: AC
  Filled 2021-09-21: qty 2

## 2021-09-21 MED ORDER — ORAL CARE MOUTH RINSE: 15.0000 mL | Freq: Once | OROMUCOSAL | Status: AC

## 2021-09-21 MED ORDER — PROPOFOL 10 MG/ML IV BOLUS
INTRAVENOUS | Status: AC
  Filled 2021-09-21: qty 20

## 2021-09-21 MED ORDER — FENTANYL CITRATE PF 50 MCG/ML IJ SOSY
25.0000 ug | PREFILLED_SYRINGE | INTRAMUSCULAR | Status: DC | PRN
  Administered 2021-09-21 (×3): 50 ug via INTRAVENOUS

## 2021-09-21 SURGICAL SUPPLY — 47 items
BAG COUNTER SPONGE SURGICOUNT (BAG) IMPLANT
BAG DECANTER FOR FLEXI CONT (MISCELLANEOUS) IMPLANT
BAG SPEC THK2 15X12 ZIP CLS (MISCELLANEOUS)
BAG SPNG CNTER NS LX DISP (BAG)
BAG ZIPLOCK 12X15 (MISCELLANEOUS) IMPLANT
BLADE SAG 18X100X1.27 (BLADE) ×2 IMPLANT
COVER PERINEAL POST (MISCELLANEOUS) ×2 IMPLANT
COVER SURGICAL LIGHT HANDLE (MISCELLANEOUS) ×2 IMPLANT
CUP ACET PINNACLE SECTR 50MM (Hips) IMPLANT
DRAPE FOOT SWITCH (DRAPES) ×2 IMPLANT
DRAPE STERI IOBAN 125X83 (DRAPES) ×2 IMPLANT
DRAPE U-SHAPE 47X51 STRL (DRAPES) ×4 IMPLANT
DRESSING AQUACEL AG SP 3.5X10 (GAUZE/BANDAGES/DRESSINGS) IMPLANT
DRSG AQUACEL AG ADV 3.5X10 (GAUZE/BANDAGES/DRESSINGS) ×2 IMPLANT
DRSG AQUACEL AG SP 3.5X10 (GAUZE/BANDAGES/DRESSINGS) ×2
DURAPREP 26ML APPLICATOR (WOUND CARE) ×2 IMPLANT
ELECT REM PT RETURN 15FT ADLT (MISCELLANEOUS) ×2 IMPLANT
GLOVE SRG 8 PF TXTR STRL LF DI (GLOVE) ×1 IMPLANT
GLOVE SURG ENC MOIS LTX SZ6.5 (GLOVE) ×2 IMPLANT
GLOVE SURG ENC MOIS LTX SZ7 (GLOVE) ×2 IMPLANT
GLOVE SURG ENC MOIS LTX SZ8 (GLOVE) ×4 IMPLANT
GLOVE SURG UNDER POLY LF SZ7 (GLOVE) ×2 IMPLANT
GLOVE SURG UNDER POLY LF SZ8 (GLOVE) ×2
GLOVE SURG UNDER POLY LF SZ8.5 (GLOVE) IMPLANT
GOWN STRL REUS W/TWL LRG LVL3 (GOWN DISPOSABLE) ×4 IMPLANT
GOWN STRL REUS W/TWL XL LVL3 (GOWN DISPOSABLE) IMPLANT
HEAD FEMORAL 32 CERAMIC (Hips) ×1 IMPLANT
HOLDER FOLEY CATH W/STRAP (MISCELLANEOUS) ×2 IMPLANT
KIT TURNOVER KIT A (KITS) IMPLANT
LINER MARATHON 32 50 (Hips) ×1 IMPLANT
MANIFOLD NEPTUNE II (INSTRUMENTS) ×2 IMPLANT
PACK ANTERIOR HIP CUSTOM (KITS) ×2 IMPLANT
PENCIL SMOKE EVACUATOR COATED (MISCELLANEOUS) ×2 IMPLANT
PINNACLE SECTOR CUP 50MM (Hips) ×2 IMPLANT
SPIKE FLUID TRANSFER (MISCELLANEOUS) ×2 IMPLANT
SPONGE T-LAP 18X18 ~~LOC~~+RFID (SPONGE) ×4 IMPLANT
STEM FEMORAL SZ6 HIGH ACTIS (Stem) ×1 IMPLANT
STRIP CLOSURE SKIN 1/2X4 (GAUZE/BANDAGES/DRESSINGS) ×2 IMPLANT
SUT ETHIBOND NAB CT1 #1 30IN (SUTURE) ×2 IMPLANT
SUT MNCRL AB 4-0 PS2 18 (SUTURE) ×2 IMPLANT
SUT STRATAFIX 0 PDS 27 VIOLET (SUTURE) ×2
SUT VIC AB 2-0 CT1 27 (SUTURE) ×4
SUT VIC AB 2-0 CT1 TAPERPNT 27 (SUTURE) ×2 IMPLANT
SUTURE STRATFX 0 PDS 27 VIOLET (SUTURE) ×1 IMPLANT
SYR 50ML LL SCALE MARK (SYRINGE) IMPLANT
TRAY FOLEY MTR SLVR 16FR STAT (SET/KITS/TRAYS/PACK) ×2 IMPLANT
TUBE SUCTION HIGH CAP CLEAR NV (SUCTIONS) ×2 IMPLANT

## 2021-09-21 NOTE — Anesthesia Procedure Notes (Signed)
Procedure Name: Intubation ?Date/Time: 09/21/2021 1:46 PM ?Performed by: Talbot Grumbling, CRNA ?Pre-anesthesia Checklist: Patient identified, Emergency Drugs available, Suction available and Patient being monitored ?Patient Re-evaluated:Patient Re-evaluated prior to induction ?Oxygen Delivery Method: Circle system utilized ?Preoxygenation: Pre-oxygenation with 100% oxygen ?Induction Type: IV induction ?Ventilation: Mask ventilation without difficulty ?Laryngoscope Size: Mac and 3 ?Grade View: Grade I ?Tube type: Oral ?Tube size: 7.5 mm ?Number of attempts: 1 ?Airway Equipment and Method: Stylet ?Placement Confirmation: ETT inserted through vocal cords under direct vision, positive ETCO2 and breath sounds checked- equal and bilateral ?Secured at: 23 cm ?Tube secured with: Tape ?Dental Injury: Teeth and Oropharynx as per pre-operative assessment  ? ? ? ? ?

## 2021-09-21 NOTE — Plan of Care (Signed)
Discussed with patient about plan of care for post-op day 0. ° ° °Will continue to monitor patient.  ° ° °SWhittemore, RN ° °

## 2021-09-21 NOTE — Transfer of Care (Signed)
Immediate Anesthesia Transfer of Care Note ? ?Patient: Aaron Black ? ?Procedure(s) Performed: TOTAL HIP ARTHROPLASTY ANTERIOR APPROACH (Left: Hip) ? ?Patient Location: PACU ? ?Anesthesia Type:General ? ?Level of Consciousness: awake, alert  and oriented ? ?Airway & Oxygen Therapy: Patient Spontanous Breathing and Patient connected to face mask oxygen ? ?Post-op Assessment: Report given to RN, Post -op Vital signs reviewed and stable and Patient moving all extremities X 4 ? ?Post vital signs: Reviewed and stable ? ?Last Vitals:  ?Vitals Value Taken Time  ?BP 157/81 09/21/21 1545  ?Temp    ?Pulse 77 09/21/21 1546  ?Resp 16 09/21/21 1546  ?SpO2 99 % 09/21/21 1546  ?Vitals shown include unvalidated device data. ? ?Last Pain:  ?Vitals:  ? 09/21/21 1139  ?TempSrc: Oral  ?PainSc:   ?   ? ?Patients Stated Pain Goal: 5 (09/21/21 1131) ? ?Complications: No notable events documented. ?

## 2021-09-21 NOTE — Anesthesia Preprocedure Evaluation (Addendum)
Anesthesia Evaluation  ?Patient identified by MRN, date of birth, ID band ?Patient awake ? ? ? ?Reviewed: ?Allergy & Precautions, NPO status , Patient's Chart, lab work & pertinent test results ? ?Airway ?Mallampati: I ? ?TM Distance: >3 FB ?Neck ROM: Full ? ? ? Dental ?no notable dental hx. ?(+) Teeth Intact, Dental Advisory Given ?  ?Pulmonary ?neg pulmonary ROS, Patient abstained from smoking., former smoker,  ?  ?Pulmonary exam normal ?breath sounds clear to auscultation ? ? ? ? ? ? Cardiovascular ?hypertension, Normal cardiovascular exam ?Rhythm:Regular Rate:Normal ? ?TTE 2014 ?- Normal LV size with mild LV hypertrophy, EF 55%. Normal RV  ???size and systolic function. No significant valvular  ???abnormalities.  ? ?  ?Neuro/Psych ?PSYCHIATRIC DISORDERS Anxiety Depression negative neurological ROS ?   ? GI/Hepatic ?Neg liver ROS, GERD  Controlled and Medicated,  ?Endo/Other  ?negative endocrine ROS ? Renal/GU ?negative Renal ROS  ?negative genitourinary ?  ?Musculoskeletal ? ?(+) Arthritis , narcotic dependent ? Abdominal ?  ?Peds ? Hematology ?negative hematology ROS ?(+)   ?Anesthesia Other Findings ? ? Reproductive/Obstetrics ? ?  ? ? ? ? ? ? ? ? ? ? ? ? ? ?  ?  ? ? ? ? ? ? ? ?Anesthesia Physical ?Anesthesia Plan ? ?ASA: 2 ? ?Anesthesia Plan: General  ? ?Post-op Pain Management: Ofirmev IV (intra-op)*  ? ?Induction:  ? ?PONV Risk Score and Plan: 2 and Treatment may vary due to age or medical condition, Midazolam, Propofol infusion, Dexamethasone and Ondansetron ? ?Airway Management Planned: Oral ETT ? ?Additional Equipment:  ? ?Intra-op Plan:  ? ?Post-operative Plan: Extubation in OR ? ?Informed Consent: I have reviewed the patients History and Physical, chart, labs and discussed the procedure including the risks, benefits and alternatives for the proposed anesthesia with the patient or authorized representative who has indicated his/her understanding and acceptance.   ? ? ? ?Dental advisory given ? ?Plan Discussed with: CRNA ? ?Anesthesia Plan Comments: (Plan for general anesthesia given pt's extensive back surgery and preexisting hardware)  ? ? ? ? ? ?Anesthesia Quick Evaluation ? ?

## 2021-09-21 NOTE — Discharge Instructions (Signed)
°Aaron Aluisio, MD °Total Joint Specialist °[facility] °[address] °[place], [place] [address] °[phone] ° °ANTERIOR APPROACH TOTAL HIP REPLACEMENT POSTOPERATIVE DIRECTIONS ° ° ° ° °Hip Rehabilitation, Guidelines Following Surgery  °The results of a hip operation are greatly improved after range of motion and muscle strengthening exercises. Follow all safety measures which are given to protect your hip. If any of these exercises cause increased pain or swelling in your joint, decrease the amount until you are comfortable again. Then slowly increase the exercises. Call your caregiver if you have problems or questions.  ° °HOME CARE INSTRUCTIONS  °Remove items at home which could result in a fall. This includes throw rugs or furniture in walking pathways.  °ICE to the affected hip as frequently as 20-30 minutes an hour and then as needed for pain and swelling. Continue to use ice on the hip for pain and swelling from surgery. You may notice swelling that will progress down to the foot and ankle. This is normal after surgery. Elevate the leg when you are not up walking on it.   °Continue to use the breathing machine which will help keep your temperature down.  It is common for your temperature to cycle up and down following surgery, especially at night when you are not up moving around and exerting yourself.  The breathing machine keeps your lungs expanded and your temperature down. ° °DIET °You may resume your previous home diet once your are discharged from the hospital. ° °DRESSING / WOUND CARE / SHOWERING °You have an adhesive waterproof bandage over the incision. Leave this in place until your first follow-up appointment. Once you remove this you will not need to place another bandage.  °You may begin showering 3 days following surgery, but do not submerge the incision under water. ° °ACTIVITY °For the first 3-5 days, it is important to rest and keep the operative leg elevated.  You should, as a general rule, rest for 50 minutes and walk/stretch for 10 minutes per hour. After 5 days, you may slowly increase activity as tolerated.  °Perform the exercises you were provided twice a day for about 15-20 minutes each session. Begin these 2 days following surgery. °Walk with your walker as instructed. Use the walker until you are comfortable transitioning to a cane. Walk with the cane in the opposite hand of the operative leg. You may discontinue the cane once you are comfortable and walking steadily. °Avoid periods of inactivity such as sitting longer than an hour when not asleep. This helps prevent blood clots.  °Do not drive a car for 6 weeks or until released by your surgeon.  °Do not drive while taking narcotics. ° °TED HOSE STOCKINGS °Wear the elastic stockings on both legs for three weeks following surgery during the day. You may remove them at night while sleeping. ° °WEIGHT BEARING °Weight bearing as tolerated with assist device (walker, cane, etc) as directed, use it as long as suggested by your surgeon or therapist, typically at least 4-6 weeks. ° °POSTOPERATIVE CONSTIPATION PROTOCOL °Constipation - defined medically as fewer than three stools per week and severe constipation as less than one stool per week. ° °One of the most common issues patients have following surgery is constipation.  Even if you have a regular bowel pattern at home, your normal regimen is likely to be disrupted due to multiple reasons following surgery.  Combination of anesthesia, postoperative narcotics, change in appetite and fluid intake all can affect your bowels.    In order to avoid complications following surgery, here are some recommendations in order to help you during your recovery period. ° °Colace (docusate) - Pick up an over-the-counter form of Colace or another stool softener and take twice a day as long as you are requiring postoperative pain medications.  Take with a full glass of water daily.  If  you experience loose stools or diarrhea, hold the colace until you stool forms back up.  If your symptoms do not get better within 1 week or if they get worse, check with your doctor. °Dulcolax (bisacodyl) - Pick up over-the-counter and take as directed by the product packaging as needed to assist with the movement of your bowels.  Take with a full glass of water.  Use this product as needed if not relieved by Colace only.  °MiraLax (polyethylene glycol) - Pick up over-the-counter to have on hand.  MiraLax is a solution that will increase the amount of water in your bowels to assist with bowel movements.  Take as directed and can mix with a glass of water, juice, soda, coffee, or tea.  Take if you go more than two days without a movement.Do not use MiraLax more than once per day. Call your doctor if you are still constipated or irregular after using this medication for 7 days in a row. ° °If you continue to have problems with postoperative constipation, please contact the office for further assistance and recommendations.  If you experience "the worst abdominal pain ever" or develop nausea or vomiting, please contact the office immediatly for further recommendations for treatment. ° °ITCHING ° If you experience itching with your medications, try taking only a single pain pill, or even half a pain pill at a time.  You can also use Benadryl over the counter for itching or also to help with sleep.  ° °MEDICATIONS °See your medication summary on the “After Visit Summary” that the nursing staff will review with you prior to discharge.  You may have some home medications which will be placed on hold until you complete the course of blood thinner medication.  It is important for you to complete the blood thinner medication as prescribed by your surgeon.  Continue your approved medications as instructed at time of discharge. ° °PRECAUTIONS °If you experience chest pain or shortness of breath - call 911 immediately for  transfer to the hospital emergency department.  °If you develop a fever greater that 101 F, purulent drainage from wound, increased redness or drainage from wound, foul odor from the wound/dressing, or calf pain - CONTACT YOUR SURGEON.   °                                                °FOLLOW-UP APPOINTMENTS °Make sure you keep all of your appointments after your operation with your surgeon and caregivers. You should call the office at the above phone number and make an appointment for approximately two weeks after the date of your surgery or on the date instructed by your surgeon outlined in the "After Visit Summary". ° °RANGE OF MOTION AND STRENGTHENING EXERCISES  °These exercises are designed to help you keep full movement of your hip joint. Follow your caregiver's or physical therapist's instructions. Perform all exercises about fifteen times, three times per day or as directed. Exercise both hips, even if you have had only   one joint replacement. These exercises can be done on a training (exercise) mat, on the floor, on a table or on a bed. Use whatever works the best and is most comfortable for you. Use music or television while you are exercising so that the exercises are a pleasant break in your day. This will make your life better with the exercises acting as a break in routine you can look forward to.  °Lying on your back, slowly slide your foot toward your buttocks, raising your knee up off the floor. Then slowly slide your foot back down until your leg is straight again.  °Lying on your back spread your legs as far apart as you can without causing discomfort.  °Lying on your side, raise your upper leg and foot straight up from the floor as far as is comfortable. Slowly lower the leg and repeat.  °Lying on your back, tighten up the muscle in the front of your thigh (quadriceps muscles). You can do this by keeping your leg straight and trying to raise your heel off the floor. This helps strengthen the  largest muscle supporting your knee.  °Lying on your back, tighten up the muscles of your buttocks both with the legs straight and with the knee bent at a comfortable angle while keeping your heel on the floor.  ° °POST-OPERATIVE OPIOID TAPER INSTRUCTIONS: °It is important to wean off of your opioid medication as soon as possible. If you do not need pain medication after your surgery it is ok to stop day one. °Opioids include: °Codeine, Hydrocodone(Norco, Vicodin), Oxycodone(Percocet, oxycontin) and hydromorphone amongst others.  °Long term and even short term use of opiods can cause: °Increased pain response °Dependence °Constipation °Depression °Respiratory depression °And more.  °Withdrawal symptoms can include °Flu like symptoms °Nausea, vomiting °And more °Techniques to manage these symptoms °Hydrate well °Eat regular healthy meals °Stay active °Use relaxation techniques(deep breathing, meditating, yoga) °Do Not substitute Alcohol to help with tapering °If you have been on opioids for less than two weeks and do not have pain than it is ok to stop all together.  °Plan to wean off of opioids °This plan should start within one week post op of your joint replacement. °Maintain the same interval or time between taking each dose and first decrease the dose.  °Cut the total daily intake of opioids by one tablet each day °Next start to increase the time between doses. °The last dose that should be eliminated is the evening dose.  ° °IF YOU ARE TRANSFERRED TO A SKILLED REHAB FACILITY °If the patient is transferred to a skilled rehab facility following release from the hospital, a list of the current medications will be sent to the facility for the patient to continue.  When discharged from the skilled rehab facility, please have the facility set up the patient's Home Health Physical Therapy prior to being released. Also, the skilled facility will be responsible for providing the patient with their medications at time of  release from the facility to include their pain medication, the muscle relaxants, and their blood thinner medication. If the patient is still at the rehab facility at time of the two week follow up appointment, the skilled rehab facility will also need to assist the patient in arranging follow up appointment in our office and any transportation needs. ° °MAKE SURE YOU:  °Understand these instructions.  °Get help right away if you are not doing well or get worse.  ° ° °DENTAL ANTIBIOTICS: ° °In most   cases prophylactic antibiotics for Dental procdeures after total joint surgery are not necessary. ° °Exceptions are as follows: ° °1. History of prior total joint infection ° °2. Severely immunocompromised (Organ Transplant, cancer chemotherapy, Rheumatoid biologic °meds such as Humera) ° °3. Poorly controlled diabetes (A1C &gt; 8.0, blood glucose over 200) ° °If you have one of these conditions, contact your surgeon for an antibiotic prescription, prior to your °dental procedure.  ° ° °Pick up stool softner and laxative for home use following surgery while on pain medications. °Do not submerge incision under water. °Please use good hand washing techniques while changing dressing each day. °May shower starting three days after surgery. °Please use a clean towel to pat the incision dry following showers. °Continue to use ice for pain and swelling after surgery. °Do not use any lotions or creams on the incision until instructed by your surgeon. ° °

## 2021-09-21 NOTE — Interval H&P Note (Signed)
History and Physical Interval Note: ? ?09/21/2021 ?12:20 PM ? ?Aaron Black  has presented today for surgery, with the diagnosis of left hip osteoarthritis.  The various methods of treatment have been discussed with the patient and family. After consideration of risks, benefits and other options for treatment, the patient has consented to  Procedure(s): ?TOTAL HIP ARTHROPLASTY ANTERIOR APPROACH (Left) as a surgical intervention.  The patient's history has been reviewed, patient examined, no change in status, stable for surgery.  I have reviewed the patient's chart and labs.  Questions were answered to the patient's satisfaction.   ? ? ?Aaron Black ? ? ?

## 2021-09-21 NOTE — Op Note (Signed)
?    OPERATIVE REPORT- TOTAL HIP ARTHROPLASTY ? ? ?PREOPERATIVE DIAGNOSIS: Osteonecrosis of the Left hip.  ? ?POSTOPERATIVE DIAGNOSIS: Osteonecrosis of the Left  hip.  ? ?PROCEDURE: Left total hip arthroplasty, anterior approach.  ? ?SURGEON: Gaynelle Arabian, MD  ? ?ASSISTANT: Theresa Duty, PA-C ? ?ANESTHESIA:  General ? ?ESTIMATED BLOOD LOSS:-350 mL   ? ?DRAINS: Hemovac x1.  ? ?COMPLICATIONS: None ?  ?CONDITION: PACU - hemodynamically stable.  ? ?BRIEF CLINICAL NOTE: Aaron Black is a 66 y.o. male who has advanced end-  ?stage osteonecrosis of their Left  hip with progressively worsening pain and  ?dysfunction.The patient has failed nonoperative management and presents for  ?total hip arthroplasty.  ? ?PROCEDURE IN DETAIL: After successful administration of spinal  ?anesthetic, the traction boots for the [facility] bed were placed on both  ?feet and the patient was placed onto the [facility] bed, boots placed into the leg  ?holders. The Left hip was then isolated from the perineum with plastic  ?drapes and prepped and draped in the usual sterile fashion. ASIS and  ?greater trochanter were marked and a oblique incision was made, starting  ?at about 1 cm lateral and 2 cm distal to the ASIS and coursing towards  ?the anterior cortex of the femur. The skin was cut with a 10 blade  ?through subcutaneous tissue to the level of the fascia overlying the  ?tensor fascia lata muscle. The fascia was then incised in line with the  ?incision at the junction of the anterior third and posterior 2/3rd. The  ?muscle was teased off the fascia and then the interval between the TFL  ?and the rectus was developed. The Hohmann retractor was then placed at  ?the top of the femoral neck over the capsule. The vessels overlying the  ?capsule were cauterized and the fat on top of the capsule was removed.  ?A Hohmann retractor was then placed anterior underneath the rectus  ?femoris to give exposure to the entire anterior capsule. A T-shaped   ?capsulotomy was performed. The edges were tagged and the femoral head  ?was identified. ?      Osteophytes are removed off the superior acetabulum.  ?The femoral neck was then cut in situ with an oscillating saw. Traction  ?was then applied to the left lower extremity utilizing the [facility]  ?traction. The femoral head was then removed. Retractors were placed  ?around the acetabulum and then circumferential removal of the labrum was  ?performed. Osteophytes were also removed. Reaming starts at 47 mm to  ?medialize and  Increased in 2 mm increments to 49 mm. We reamed in  ?approximately 40 degrees of abduction, 20 degrees anteversion. A 50 mm  ?pinnacle acetabular shell was then impacted in anatomic position under  ?fluoroscopic guidance with excellent purchase. We did not need to place  ?any additional dome screws. A 32 mm neutral + 4 marathon liner was then  ?placed into the acetabular shell.  ?     The femoral lift was then placed along the lateral aspect of the femur  ?just distal to the vastus ridge. The leg was  externally rotated and capsule  ?was stripped off the inferior aspect of the femoral neck down to the  ?level of the lesser trochanter, this was done with electrocautery. The femur was lifted after this was performed. The  leg was then placed in an extended and adducted position essentially delivering the femur. We also removed the capsule superiorly and the piriformis from the piriformis  fossa to gain excellent exposure of the  ?proximal femur. Rongeur was used to remove some cancellous bone to get  ?into the lateral portion of the proximal femur for placement of the  ?initial starter reamer. The starter broaches was placed  the starter broach  and was shown to go down the center of the canal. Broaching  ?with the Actis system was then performed starting at size 0  coursing  ?Up to size 6. A size 6 had excellent torsional and rotational  ?and axial stability. The trial high offset neck was then placed   ?with a 32 + 1 trial head. The hip was then reduced. We confirmed that  ?the stem was in the canal both on AP and lateral x-rays. It also has excellent sizing. The hip was reduced with outstanding stability through full extension and full external rotation.. AP pelvis was taken and the leg lengths were measured and found to be equal. Hip was then dislocated again and the femoral head and neck removed. The  ?femoral broach was removed. Size 6 Actis stem with a high offset  ?neck was then impacted into the femur following native anteversion. Has  ?excellent purchase in the canal. Excellent torsional and rotational and  ?axial stability. It is confirmed to be in the canal on AP and lateral  ?fluoroscopic views. The 32 + 1 ceramic head was placed and the hip  ?reduced with outstanding stability. Again AP pelvis was taken and it  ?confirmed that the leg lengths were equal. The wound was then copiously  ?irrigated with saline solution and the capsule reattached and repaired  ?with Ethibond suture. 30 ml of .25% Bupivicaine was  injected into the capsule and into the edge of the tensor fascia lata as well as subcutaneous tissue. The fascia overlying the tensor fascia lata was then closed with a running #1 V-Loc. Subcu was closed with interrupted 2-0 Vicryl and subcuticular running 4-0 Monocryl. Incision was cleaned  ?and dried. Steri-Strips and a bulky sterile dressing applied. The patient was awakened and transported to  ?recovery in stable condition.  ?      Please note that a surgical assistant was a medical necessity for this procedure to perform it in a safe and expeditious manner. Assistant was necessary to provide appropriate retraction of vital neurovascular structures and to prevent femoral fracture and allow for anatomic placement of the prosthesis. ? ?Gaynelle Arabian, M.D.  ? ? ?

## 2021-09-22 ENCOUNTER — Encounter (HOSPITAL_COMMUNITY): Payer: Self-pay | Admitting: Orthopedic Surgery

## 2021-09-22 LAB — CBC
HCT: 29.7 % — ABNORMAL LOW (ref 39.0–52.0)
Hemoglobin: 10 g/dL — ABNORMAL LOW (ref 13.0–17.0)
MCH: 30.5 pg (ref 26.0–34.0)
MCHC: 33.7 g/dL (ref 30.0–36.0)
MCV: 90.5 fL (ref 80.0–100.0)
Platelets: 217 10*3/uL (ref 150–400)
RBC: 3.28 MIL/uL — ABNORMAL LOW (ref 4.22–5.81)
RDW: 13.3 % (ref 11.5–15.5)
WBC: 10.5 10*3/uL (ref 4.0–10.5)
nRBC: 0 % (ref 0.0–0.2)

## 2021-09-22 LAB — BASIC METABOLIC PANEL
Anion gap: 8 (ref 5–15)
BUN: 11 mg/dL (ref 8–23)
CO2: 26 mmol/L (ref 22–32)
Calcium: 8.8 mg/dL — ABNORMAL LOW (ref 8.9–10.3)
Chloride: 96 mmol/L — ABNORMAL LOW (ref 98–111)
Creatinine, Ser: 0.83 mg/dL (ref 0.61–1.24)
GFR, Estimated: >60 mL/min (ref >60)
Glucose, Bld: 143 mg/dL — ABNORMAL HIGH (ref 70–99)
Potassium: 4.3 mmol/L (ref 3.5–5.1)
Sodium: 130 mmol/L — ABNORMAL LOW (ref 135–145)

## 2021-09-22 MED ORDER — ASPIRIN 325 MG PO TBEC: 325.0000 mg | DELAYED_RELEASE_TABLET | Freq: Two times a day (BID) | ORAL | 0 refills | Status: AC

## 2021-09-22 MED ORDER — CYCLOBENZAPRINE HCL 10 MG PO TABS: 10.0000 mg | ORAL_TABLET | Freq: Three times a day (TID) | ORAL | 0 refills | Status: DC

## 2021-09-22 MED ORDER — OXYCODONE HCL 5 MG PO TABS: 5.0000 mg | ORAL_TABLET | Freq: Four times a day (QID) | ORAL | 0 refills | Status: DC | PRN

## 2021-09-22 NOTE — Progress Notes (Signed)
Physical Therapy Treatment ?Patient Details ?Name: Aaron Black ?MRN: [id number] ?DOB: 1956/01/30 ?Today's Date: 09/22/2021 ? ? ?History of Present Illness Pt s/p L THR and with hx of bil RCR and multiple back surgeries ? ?  ?PT Comments  ? ? Pt continues very cooperative and progressing well with mobility but requiring cues to slow for safety and limited by pain.  Pt up to ambulate limited distance in hall, negotiated stairs, reviewed bed mobility, reviewed car transfers, and reviewed HEP with written instruction provided.  Pt and spouse expressing concern regarding pain control at home after difficult episode this am - RN aware.     ?Recommendations for follow up therapy are one component of a multi-disciplinary discharge planning process, led by the attending physician.  Recommendations may be updated based on patient status, additional functional criteria and insurance authorization. ? ?Follow Up Recommendations ? Follow physician's recommendations for discharge plan and follow up therapies ?  ?  ?Assistance Recommended at Discharge Intermittent Supervision/Assistance  ?Patient can return home with the following A little help with walking and/or transfers;A little help with bathing/dressing/bathroom;Assistance with cooking/housework;Assist for transportation;Help with stairs or ramp for entrance ?  ?Equipment Recommendations ? None recommended by PT  ?  ?Recommendations for Other Services   ? ? ?  ?Precautions / Restrictions Precautions ?Precautions: Fall ?Restrictions ?Weight Bearing Restrictions: No ?Other Position/Activity Restrictions: WBAT  ?  ? ?Mobility ? Bed Mobility ?Overal bed mobility: Needs Assistance ?Bed Mobility: Supine to Sit, Sit to Supine ?  ?  ?Supine to sit: Min guard, Supervision ?Sit to supine: Min guard, Supervision ?  ?General bed mobility comments: cues for sequence and use of R LE to self assist ?  ? ?Transfers ?Overall transfer level: Needs assistance ?Equipment used: Rolling walker (2  wheels) ?Transfers: Sit to/from Stand ?Sit to Stand: Supervision ?  ?  ?  ?  ?  ?General transfer comment: cues for LE management and use of UEs to self assist ?  ? ?Ambulation/Gait ?Ambulation/Gait assistance: Min guard, Supervision ?Gait Distance (Feet): 100 Feet ?Assistive device: Rolling walker (2 wheels) ?Gait Pattern/deviations: Step-to pattern, Step-through pattern, Decreased step length - right, Decreased step length - left, Shuffle, Trunk flexed ?Gait velocity: decr ?  ?  ?General Gait Details: cues for posture, position from RW and intial sequence ? ? ?Stairs ?Stairs: Yes ?Stairs assistance: Min assist ?Stair Management: No rails, Step to pattern, Forwards, Backwards, With walker ?Number of Stairs: 2 ?General stair comments: single step twice - once fwd and once bkwd ? ? ?Wheelchair Mobility ?  ? ?Modified Rankin (Stroke Patients Only) ?  ? ? ?  ?Balance Overall balance assessment: Needs assistance ?Sitting-balance support: No upper extremity supported, Feet supported ?Sitting balance-Leahy Scale: Good ?  ?  ?Standing balance support: No upper extremity supported ?Standing balance-Leahy Scale: Fair ?  ?  ?  ?  ?  ?  ?  ?  ?  ?  ?  ?  ?  ? ?  ?Cognition Arousal/Alertness: Awake/alert ?Behavior During Therapy: [facility] for tasks assessed/performed ?Overall Cognitive Status: Within Functional Limits for tasks assessed ?  ?  ?  ?  ?  ?  ?  ?  ?  ?  ?  ?  ?  ?  ?  ?  ?  ?  ?  ? ?  ?Exercises Total Joint Exercises ?Ankle Circles/Pumps: AROM, Both, 15 reps, Supine ?Quad Sets: AROM, Both, Supine, 5 reps ?Heel Slides: AAROM, Left, Supine, 10 reps ?Hip ABduction/ADduction: AAROM,  Left, Supine, 5 reps ?Long Arc Quad: AAROM, Left, 10 reps, Seated ? ?  ?General Comments   ?  ?  ? ?Pertinent Vitals/Pain Pain Assessment ?Pain Assessment: 0-10 ?Pain Score: 8  ?Pain Location: L hip/thigh ?Pain Descriptors / Indicators: Aching, Burning, Grimacing ?Pain Intervention(s): Limited activity within patient's tolerance, Monitored  during session, Premedicated before session, Ice applied  ? ? ?Home Living Family/patient expects to be discharged to:: Private residence ?Living Arrangements: Spouse/significant other ?Available Help at Discharge: Family;Available 24 hours/day ?Type of Home: House ?Home Access: Stairs to enter ?  ?Entrance Stairs-Number of Steps: 1 ?  ?Home Layout: One level ?Home Equipment: Conservation officer, nature (2 wheels);Cane - single point;BSC/3in1 ?   ?  ?Prior Function    ?  ?  ?   ? ?PT Goals (current goals can now be found in the care plan section) Acute Rehab PT Goals ?Patient Stated Goal: Regain IND ?PT Goal Formulation: With patient ?Time For Goal Achievement: 09/29/21 ?Potential to Achieve Goals: Good ?Progress towards PT goals: Progressing toward goals ? ?  ?Frequency ? ? ? 7X/week ? ? ? ?  ?PT Plan Current plan remains appropriate  ? ? ?Co-evaluation   ?  ?  ?  ?  ? ?  ?AM-PAC PT "6 Clicks" Mobility   ?Outcome Measure ? Help needed turning from your back to your side while in a flat bed without using bedrails?: A Little ?Help needed moving from lying on your back to sitting on the side of a flat bed without using bedrails?: A Little ?Help needed moving to and from a bed to a chair (including a wheelchair)?: A Little ?Help needed standing up from a chair using your arms (e.g., wheelchair or bedside chair)?: A Little ?Help needed to walk in hospital room?: A Little ?Help needed climbing 3-5 steps with a railing? : A Little ?6 Click Score: 18 ? ?  ?End of Session Equipment Utilized During Treatment: Gait belt ?Activity Tolerance: Patient tolerated treatment well;Patient limited by pain ?Patient left: with call bell/phone within reach;with family/visitor present;in bed ?Nurse Communication: Mobility status ?PT Visit Diagnosis: Difficulty in walking, not elsewhere classified (R26.2) ?  ? ? ?Time: 5859-2924 ?PT Time Calculation (min) (ACUTE ONLY): 44 min ? ?Charges:  $Gait Training: 8-22 mins ?$Therapeutic Exercise: 8-22  mins ?$Therapeutic Activity: 8-22 mins          ?          ? ?Debe Coder PT ?Acute Rehabilitation Services ?Pager [phone] ?Office [phone] ? ? ? ?Donnica Jarnagin ?09/22/2021, 1:27 PM ? ?

## 2021-09-22 NOTE — Progress Notes (Signed)
Pt and wife express concern for pain control. Pt continues to express pain 8 and 9 out of 10 . ?Pt relieves to a 6 after administration. ? ?MD and PA [facility] notified as pt is in agreement to stay overnight if pain not controlled. ?Awaiting MD. ?

## 2021-09-22 NOTE — Progress Notes (Signed)
Pt to stay overnight for pain control per PA. ?

## 2021-09-22 NOTE — Evaluation (Signed)
Physical Therapy Evaluation ?Patient Details ?Name: Aaron Black ?MRN: [id number] ?DOB: [birth year] ?Today's Date: 09/22/2021 ? ?History of Present Illness ? Pt s/p L THR and with hx of bil RCR and multiple back surgeries  ?Clinical Impression ? Pt s/p L THR and presents with decreased L LE strength/ROM and post op pain limiting functional mobility.  Pt should progress to dc home with family assist. ?   ? ?Recommendations for follow up therapy are one component of a multi-disciplinary discharge planning process, led by the attending physician.  Recommendations may be updated based on patient status, additional functional criteria and insurance authorization. ? ?Follow Up Recommendations Follow physician's recommendations for discharge plan and follow up therapies ? ?  ?Assistance Recommended at Discharge Intermittent Supervision/Assistance  ?Patient can return home with the following ? A little help with walking and/or transfers;A little help with bathing/dressing/bathroom;Assistance with cooking/housework;Assist for transportation;Help with stairs or ramp for entrance ? ?  ?Equipment Recommendations None recommended by PT  ?Recommendations for Other Services ?    ?  ?Functional Status Assessment Patient has had a recent decline in their functional status and demonstrates the ability to make significant improvements in function in a reasonable and predictable amount of time.  ? ?  ?Precautions / Restrictions Precautions ?Precautions: Fall ?Restrictions ?Weight Bearing Restrictions: No ?Other Position/Activity Restrictions: WBAT  ? ?  ? ?Mobility ? Bed Mobility ?Overal bed mobility: Needs Assistance ?Bed Mobility: Supine to Sit ?  ?  ?Supine to sit: Min assist ?  ?  ?General bed mobility comments: cues for sequence and use of R LE to self assist ?  ? ?Transfers ?Overall transfer level: Needs assistance ?Equipment used: Rolling walker (2 wheels) ?Transfers: Sit to/from Stand ?Sit to Stand: Min guard ?  ?  ?  ?  ?   ?General transfer comment: cues for LE management and use of UEs to self assist ?  ? ?Ambulation/Gait ?Ambulation/Gait assistance: Min assist, Min guard ?Gait Distance (Feet): 140 Feet ?Assistive device: Rolling walker (2 wheels) ?Gait Pattern/deviations: Step-to pattern, Step-through pattern, Decreased step length - right, Decreased step length - left, Shuffle, Trunk flexed ?Gait velocity: decr ?  ?  ?General Gait Details: cues for posture, position from RW and intial sequence ? ?Stairs ?  ?  ?  ?  ?  ? ?Wheelchair Mobility ?  ? ?Modified Rankin (Stroke Patients Only) ?  ? ?  ? ?Balance Overall balance assessment: Needs assistance ?Sitting-balance support: No upper extremity supported, Feet supported ?Sitting balance-Leahy Scale: Good ?  ?  ?Standing balance support: No upper extremity supported ?Standing balance-Leahy Scale: Fair ?  ?  ?  ?  ?  ?  ?  ?  ?  ?  ?  ?  ?   ? ? ? ?Pertinent Vitals/Pain Pain Assessment ?Pain Assessment: 0-10 ?Pain Score: 8  ?Pain Location: L hip/thigh ?Pain Descriptors / Indicators: Aching, Burning, Grimacing ?Pain Intervention(s): Limited activity within patient's tolerance, Monitored during session, Premedicated before session, Ice applied  ? ? ?Home Living Family/patient expects to be discharged to:: Private residence ?Living Arrangements: Spouse/significant other ?Available Help at Discharge: Family;Available 24 hours/day ?Type of Home: House ?Home Access: Stairs to enter ?  ?Entrance Stairs-Number of Steps: 1 ?  ?Home Layout: One level ?Home Equipment: Conservation officer, nature (2 wheels);Cane - single point;BSC/3in1 ?   ?  ?Prior Function Prior Level of Function : Independent/Modified Independent ?  ?  ?  ?  ?  ?  ?  ?  ?  ? ? ?  Hand Dominance  ? Dominant Hand: Right ? ?  ?Extremity/Trunk Assessment  ? Upper Extremity Assessment ?Upper Extremity Assessment: Overall WFL for tasks assessed ?  ? ?Lower Extremity Assessment ?Lower Extremity Assessment: LLE deficits/detail ?LLE Deficits /  Details: AAROM at hip to 80 flex and 20 abd; strength at hip 2+/5 ?  ? ?Cervical / Trunk Assessment ?Cervical / Trunk Assessment: Normal  ?Communication  ? Communication: No difficulties  ?Cognition Arousal/Alertness: Awake/alert ?Behavior During Therapy: [facility] for tasks assessed/performed ?Overall Cognitive Status: Within Functional Limits for tasks assessed ?  ?  ?  ?  ?  ?  ?  ?  ?  ?  ?  ?  ?  ?  ?  ?  ?  ?  ?  ? ?  ?General Comments   ? ?  ?Exercises Total Joint Exercises ?Ankle Circles/Pumps: AROM, Both, 15 reps, Supine ?Quad Sets: AROM, Both, 10 reps, Supine ?Heel Slides: AAROM, Left, 20 reps, Supine ?Hip ABduction/ADduction: AAROM, Left, 15 reps, Supine  ? ?Assessment/Plan  ?  ?PT Assessment Patient needs continued PT services  ?PT Problem List Decreased strength;Decreased range of motion;Decreased activity tolerance;Decreased balance;Decreased mobility;Decreased knowledge of use of DME;Pain ? ?   ?  ?PT Treatment Interventions DME instruction;Gait training;Stair training;Functional mobility training;Therapeutic activities;Therapeutic exercise;Patient/family education   ? ?PT Goals (Current goals can be found in the Care Plan section)  ?Acute Rehab PT Goals ?Patient Stated Goal: Regain IND ?PT Goal Formulation: With patient ?Time For Goal Achievement: 09/29/21 ?Potential to Achieve Goals: Good ? ?  ?Frequency 7X/week ?  ? ? ?Co-evaluation   ?  ?  ?  ?  ? ? ?  ?AM-PAC PT "6 Clicks" Mobility  ?Outcome Measure Help needed turning from your back to your side while in a flat bed without using bedrails?: A Little ?Help needed moving from lying on your back to sitting on the side of a flat bed without using bedrails?: A Little ?Help needed moving to and from a bed to a chair (including a wheelchair)?: A Little ?Help needed standing up from a chair using your arms (e.g., wheelchair or bedside chair)?: A Little ?Help needed to walk in hospital room?: A Little ?Help needed climbing 3-5 steps with a railing? : A  Little ?6 Click Score: 18 ? ?  ?End of Session Equipment Utilized During Treatment: Gait belt ?Activity Tolerance: Patient tolerated treatment well ?Patient left: in chair;with call bell/phone within reach;with chair alarm set;with family/visitor present ?Nurse Communication: Mobility status ?PT Visit Diagnosis: Difficulty in walking, not elsewhere classified (R26.2) ?  ? ?Time: [id number] ?PT Time Calculation (min) (ACUTE ONLY): 33 min ? ? ?Charges:     ?PT Treatments ?$Therapeutic Exercise: 8-22 mins ?  ?   ? ? ?Debe Coder PT ?Acute Rehabilitation Services ?Pager [phone] ?Office [phone] ? ? ?Bell Carbo ?09/22/2021, 1:20 PM ? ?

## 2021-09-22 NOTE — TOC Transition Note (Signed)
Transition of Care (TOC) - CM/SW Discharge Note ? ?Patient Details  ?Name: Aaron Black ?MRN: [id number] ?Date of Birth: 04/10/1956 ? ?Transition of Care (TOC) CM/SW Contact:  ?Megan S Glenn, LCSW ?Phone Number: ?09/22/2021, 1:51 PM ? ?Clinical Narrative: Patient is expected to discharge home after working with PT. CSW met with patient to confirm discharge plan. Patient will go home with a home exercise program (HEP). Patient has a rolling walker, rollator, and 3N1 at home so there are no DME needs at this time. TOC signing off. ? ?Final next level of care: Home/Self Care ?Barriers to Discharge: No Barriers Identified ? ?Patient Goals and CMS Choice ?Patient states their goals for this hospitalization and ongoing recovery are:: Discharge home with HEP ?Choice offered to / list presented to : NA ? ?Discharge Plan and Services        ?DME Arranged: N/A ?DME Agency: NA ? ?Readmission Risk Interventions ?No flowsheet data found. ? ?

## 2021-09-22 NOTE — Plan of Care (Signed)
  Problem: Nutrition: Goal: Adequate nutrition will be maintained Outcome: Progressing   

## 2021-09-22 NOTE — Progress Notes (Signed)
? ?Subjective: ?1 Day Post-Op Procedure(s) (LRB): ?TOTAL HIP ARTHROPLASTY ANTERIOR APPROACH (Left) ?Patient reports pain as mild.   ?Patient seen in rounds by Dr. Wynelle Link. ?Patient is well, and has had no acute complaints or problems. Denies chest pain or SOB. No issues overnight. Voiding without difficulty. ?We will begin therapy today. ? ?Objective: ?Vital signs in last 24 hours: ?Temp:  [97.5 ?F (36.4 ?C)-98.7 ?F (37.1 ?C)] 98 ?F (36.7 ?C) (03/02 0530) ?Pulse Rate:  [69-84] 77 (03/02 0530) ?Resp:  [10-18] 16 (03/02 0530) ?BP: (137-162)/(79-97) 157/91 (03/02 0530) ?SpO2:  [97 %-100 %] 98 % (03/02 0530) ?Weight:  [78.7 kg] 78.7 kg (03/01 1131) ? ?Intake/Output from previous day: ? ?Intake/Output Summary (Last 24 hours) at 09/22/2021 0824 ?Last data filed at 09/22/2021 0730 ?Gross per 24 hour  ?Intake 3480.45 ml  ?Output 5730 ml  ?Net -2249.55 ml  ?  ? ?Intake/Output this shift: ?Total I/O ?In: -  ?Out: 300 [Urine:300] ? ?Labs: ?Recent Labs  ?  09/22/21 ?0335  ?HGB 10.0*  ? ?Recent Labs  ?  09/22/21 ?0335  ?WBC 10.5  ?RBC 3.28*  ?HCT 29.7*  ?PLT 217  ? ?Recent Labs  ?  09/22/21 ?0335  ?NA 130*  ?K 4.3  ?CL 96*  ?CO2 26  ?BUN 11  ?CREATININE 0.83  ?GLUCOSE 143*  ?CALCIUM 8.8*  ? ?No results for input(s): LABPT, INR in the last 72 hours. ? ?Exam: ?General - Patient is Alert and Oriented ?Extremity - Neurologically intact ?Neurovascular intact ?Sensation intact distally ?Dorsiflexion/Plantar flexion intact ?Dressing - dressing C/D/I ?Motor Function - intact, moving foot and toes well on exam.  ? ?Past Medical History:  ?Diagnosis Date  ? Anemia   ? Anxiety   ? Arm pain   ? Arthritis   ? B12 deficiency   ? Back pain   ? Cancer [facility])   ? throat cancer   ? Cellulitis of arm, left 10/2017  ? after cat scratch  ? Cervical disc disease   ? a. 07/2003 ant cervical deomcpression and fusion C5-6/C6-7;  b. 09/2007 post cervical laminectomy C4-5 with scres and arthrodesis C4-C7.  ? Chest pain at rest 02/25/2013  ? Chronic lower back  pain   ? Complication of anesthesia   ? " i WOKE UP DURING A COLONOSCOPY "- 2008  ? Depression   ? Diverticulosis of colon   ? GERD (gastroesophageal reflux disease)   ? Gout   ? Hyperlipidemia   ? Hypertension   ? Joint pain   ? Leg pain   ? While walking  ? Leg weakness   ? Neck pain   ? Pleurisy   ? Shortness of breath dyspnea   ? with exertion- "I cant exercise now"  ? Tobacco abuse   ? a. 40 yrs, 1.5-3 ppd over that time.  ? ? ?Assessment/Plan: ?1 Day Post-Op Procedure(s) (LRB): ?TOTAL HIP ARTHROPLASTY ANTERIOR APPROACH (Left) ?Principal Problem: ?  OA (osteoarthritis) of hip ?Active Problems: ?  S/P total left hip arthroplasty ?  Osteoarthritis of left hip ? ?Estimated body mass index is 26.4 kg/m? as calculated from the following: ?  Height as of this encounter: 5\' 8"  (1.727 m). ?  Weight as of this encounter: 78.7 kg. ?Advance diet ?Up with therapy ?D/C IV fluids ? ?DVT Prophylaxis - Aspirin ?Weight bearing as tolerated. ?Begin therapy. ? ?Plan is to go Home after hospital stay. ?Plan for discharge with HEP later today if progresses with therapy and meeting goals. ?Follow-up in the office in  2 weeks. ? ?The PDMP database was reviewed today prior to any opioid medications being prescribed to this patient. ? ?Theresa Duty, PA-C ?Orthopedic Surgery ?[phone] ?09/22/2021, 8:24 AM ? ?

## 2021-09-22 NOTE — Plan of Care (Signed)
Plan of care reviewed and discussed with the patient. 

## 2021-09-23 LAB — CBC
HCT: 29.8 % — ABNORMAL LOW (ref 39.0–52.0)
Hemoglobin: 9.8 g/dL — ABNORMAL LOW (ref 13.0–17.0)
MCH: 29.9 pg (ref 26.0–34.0)
MCHC: 32.9 g/dL (ref 30.0–36.0)
MCV: 90.9 fL (ref 80.0–100.0)
Platelets: 219 10*3/uL (ref 150–400)
RBC: 3.28 MIL/uL — ABNORMAL LOW (ref 4.22–5.81)
RDW: 13.5 % (ref 11.5–15.5)
WBC: 11.8 10*3/uL — ABNORMAL HIGH (ref 4.0–10.5)
nRBC: 0 % (ref 0.0–0.2)

## 2021-09-23 LAB — BASIC METABOLIC PANEL
Anion gap: 11 (ref 5–15)
BUN: 14 mg/dL (ref 8–23)
CO2: 26 mmol/L (ref 22–32)
Calcium: 8.9 mg/dL (ref 8.9–10.3)
Chloride: 97 mmol/L — ABNORMAL LOW (ref 98–111)
Creatinine, Ser: 0.74 mg/dL (ref 0.61–1.24)
GFR, Estimated: >60 mL/min (ref >60)
Glucose, Bld: 138 mg/dL — ABNORMAL HIGH (ref 70–99)
Potassium: 4.1 mmol/L (ref 3.5–5.1)
Sodium: 134 mmol/L — ABNORMAL LOW (ref 135–145)

## 2021-09-23 NOTE — Progress Notes (Signed)
Physical Therapy Treatment ?Patient Details ?Name: Aaron Black ?MRN: [id number] ?DOB: 11/28/1955 ?Today's Date: 09/23/2021 ? ? ?History of Present Illness Pt s/p L THR and with hx of bil RCR and multiple back surgeries ? ?  ?PT Comments  ? ? Pt continues motivated, progressing well with mobility and with marked improvement in pain control.  Pt performed HEP with assist and up to ambulate increased distance in hall.  Pt eager for dc home this date.   ?Recommendations for follow up therapy are one component of a multi-disciplinary discharge planning process, led by the attending physician.  Recommendations may be updated based on patient status, additional functional criteria and insurance authorization. ? ?Follow Up Recommendations ? Follow physician's recommendations for discharge plan and follow up therapies ?  ?  ?Assistance Recommended at Discharge Intermittent Supervision/Assistance  ?Patient can return home with the following A little help with walking and/or transfers;A little help with bathing/dressing/bathroom;Assistance with cooking/housework;Assist for transportation;Help with stairs or ramp for entrance ?  ?Equipment Recommendations ? None recommended by PT  ?  ?Recommendations for Other Services   ? ? ?  ?Precautions / Restrictions Precautions ?Precautions: Fall ?Restrictions ?Weight Bearing Restrictions: No ?Other Position/Activity Restrictions: WBAT  ?  ? ?Mobility ? Bed Mobility ?Overal bed mobility: Needs Assistance ?Bed Mobility: Supine to Sit, Sit to Supine ?  ?  ?Supine to sit: Supervision ?Sit to supine: Supervision ?  ?  ?  ? ?Transfers ?Overall transfer level: Needs assistance ?Equipment used: Rolling walker (2 wheels) ?Transfers: Sit to/from Stand ?Sit to Stand: Supervision ?  ?  ?  ?  ?  ?General transfer comment: cues for LE management and use of UEs to self assist ?  ? ?Ambulation/Gait ?Ambulation/Gait assistance: Min guard, Supervision ?Gait Distance (Feet): 180 Feet ?Assistive device:  Rolling walker (2 wheels) ?Gait Pattern/deviations: Step-to pattern, Step-through pattern, Decreased step length - right, Decreased step length - left, Shuffle, Trunk flexed ?Gait velocity: decr ?  ?  ?General Gait Details: cues for posture, position from RW and intial sequence ? ? ?Stairs ?  ?  ?  ?  ?  ? ? ?Wheelchair Mobility ?  ? ?Modified Rankin (Stroke Patients Only) ?  ? ? ?  ?Balance Overall balance assessment: Needs assistance ?Sitting-balance support: No upper extremity supported, Feet supported ?Sitting balance-Leahy Scale: Good ?  ?  ?Standing balance support: No upper extremity supported ?Standing balance-Leahy Scale: Fair ?  ?  ?  ?  ?  ?  ?  ?  ?  ?  ?  ?  ?  ? ?  ?Cognition Arousal/Alertness: Awake/alert ?Behavior During Therapy: [facility] for tasks assessed/performed ?Overall Cognitive Status: Within Functional Limits for tasks assessed ?  ?  ?  ?  ?  ?  ?  ?  ?  ?  ?  ?  ?  ?  ?  ?  ?  ?  ?  ? ?  ?Exercises Total Joint Exercises ?Ankle Circles/Pumps: AROM, Both, 15 reps, Supine ?Quad Sets: AROM, Both, Supine, 10 reps ?Heel Slides: AAROM, Left, Supine, 20 reps ?Hip ABduction/ADduction: AAROM, Left, Supine, 15 reps ?Long Arc Quad: AROM, Left, 10 reps, Seated ? ?  ?General Comments   ?  ?  ? ?Pertinent Vitals/Pain Pain Assessment ?Pain Assessment: 0-10 ?Pain Score: 5  ?Pain Location: L hip/thigh ?Pain Descriptors / Indicators: Aching, Burning ?Pain Intervention(s): Limited activity within patient's tolerance, Monitored during session, Premedicated before session, Ice applied  ? ? ?Home Living   ?  ?  ?  ?  ?  ?  ?  ?  ?  ?   ?  ?  Prior Function    ?  ?  ?   ? ?PT Goals (current goals can now be found in the care plan section) Acute Rehab PT Goals ?Patient Stated Goal: Regain IND ?PT Goal Formulation: With patient ?Time For Goal Achievement: 09/29/21 ?Potential to Achieve Goals: Good ?Progress towards PT goals: Progressing toward goals ? ?  ?Frequency ? ? ? 7X/week ? ? ? ?  ?PT Plan Current plan remains  appropriate  ? ? ?Co-evaluation   ?  ?  ?  ?  ? ?  ?AM-PAC PT "6 Clicks" Mobility   ?Outcome Measure ? Help needed turning from your back to your side while in a flat bed without using bedrails?: A Little ?Help needed moving from lying on your back to sitting on the side of a flat bed without using bedrails?: A Little ?Help needed moving to and from a bed to a chair (including a wheelchair)?: A Little ?Help needed standing up from a chair using your arms (e.g., wheelchair or bedside chair)?: A Little ?Help needed to walk in hospital room?: A Little ?Help needed climbing 3-5 steps with a railing? : A Little ?6 Click Score: 18 ? ?  ?End of Session Equipment Utilized During Treatment: Gait belt ?Activity Tolerance: Patient tolerated treatment well ?Patient left: with call bell/phone within reach;with family/visitor present;in bed ?Nurse Communication: Mobility status ?PT Visit Diagnosis: Difficulty in walking, not elsewhere classified (R26.2) ?  ? ? ?Time: [phone] ?PT Time Calculation (min) (ACUTE ONLY): 27 min ? ?Charges:  $Gait Training: 8-22 mins ?$Therapeutic Exercise: 8-22 mins          ?          ? ?Aaron Black PT ?Acute Rehabilitation Services ?Pager [phone] ?Office [phone] ? ? ? ?Aaron Black ?09/23/2021, 9:44 AM ? ?

## 2021-09-23 NOTE — Plan of Care (Signed)
?  Problem: Pain Management: ?Goal: Pain level will decrease with appropriate interventions ?09/23/2021 0747 by Mayme Genta, RN ?Outcome: Progressing ?09/23/2021 0746 by Mayme Genta, RN ?Outcome: Progressing ?  ?Problem: Activity: ?Goal: Ability to avoid complications of mobility impairment will improve ?09/23/2021 0747 by Mayme Genta, RN ?Outcome: Progressing ?09/23/2021 0746 by Mayme Genta, RN ?Outcome: Progressing ?Goal: Ability to tolerate increased activity will improve ?09/23/2021 0747 by Mayme Genta, RN ?Outcome: Progressing ?09/23/2021 0746 by Mayme Genta, RN ?Outcome: Progressing ?  ?

## 2021-09-23 NOTE — Progress Notes (Signed)
Discharge package printed and instructions given to patient. Patient verbalizes understanding. 

## 2021-09-23 NOTE — Progress Notes (Signed)
? ?Subjective: ?2 Days Post-Op Procedure(s) (LRB): ?TOTAL HIP ARTHROPLASTY ANTERIOR APPROACH (Left) ?Patient reports pain as mild.   ?Patient seen in rounds with Dr. Wynelle Link. ?Patient had issues with pain yesterday, prompting additional night stay. It was discussed with the patient prior to surgery that chronic opioid use would complicate pain control postoperatively. Doing better this AM, resting comfortably in bed. Denies chest pain or SOB. Voiding without difficulty. ?Plan is to go Home after hospital stay. ? ?Objective: ?Vital signs in last 24 hours: ?Temp:  [97.6 ?F (36.4 ?C)-98.1 ?F (36.7 ?C)] 97.8 ?F (36.6 ?C) (03/03 9892) ?Pulse Rate:  [65-83] 65 (03/03 0649) ?Resp:  [15-18] 15 (03/03 1194) ?BP: (113-162)/(72-89) 162/89 (03/03 1740) ?SpO2:  [95 %-99 %] 99 % (03/03 0649) ? ?Intake/Output from previous day: ? ?Intake/Output Summary (Last 24 hours) at 09/23/2021 0748 ?Last data filed at 09/23/2021 8144 ?Gross per 24 hour  ?Intake 545.97 ml  ?Output 4575 ml  ?Net -4029.03 ml  ?  ?Intake/Output this shift: ?No intake/output data recorded. ? ?Labs: ?Recent Labs  ?  09/22/21 ?8185 09/23/21 ?0326  ?HGB 10.0* 9.8*  ? ?Recent Labs  ?  09/22/21 ?6314 09/23/21 ?0326  ?WBC 10.5 11.8*  ?RBC 3.28* 3.28*  ?HCT 29.7* 29.8*  ?PLT 217 219  ? ?Recent Labs  ?  09/22/21 ?9702 09/23/21 ?0326  ?NA 130* 134*  ?K 4.3 4.1  ?CL 96* 97*  ?CO2 26 26  ?BUN 11 14  ?CREATININE 0.83 0.74  ?GLUCOSE 143* 138*  ?CALCIUM 8.8* 8.9  ? ?No results for input(s): LABPT, INR in the last 72 hours. ? ?Exam: ?General - Patient is Alert and Oriented ?Extremity - Neurologically intact ?Neurovascular intact ?Sensation intact distally ?Dorsiflexion/Plantar flexion intact ?Dressing/Incision - clean, dry, no drainage ?Motor Function - intact, moving foot and toes well on exam.  ? ?Past Medical History:  ?Diagnosis Date  ? Anemia   ? Anxiety   ? Arm pain   ? Arthritis   ? B12 deficiency   ? Back pain   ? Cancer [facility])   ? throat cancer   ? Cellulitis of arm, left  10/2017  ? after cat scratch  ? Cervical disc disease   ? a. 07/2003 ant cervical deomcpression and fusion C5-6/C6-7;  b. 09/2007 post cervical laminectomy C4-5 with scres and arthrodesis C4-C7.  ? Chest pain at rest 02/25/2013  ? Chronic lower back pain   ? Complication of anesthesia   ? " i WOKE UP DURING A COLONOSCOPY "- 2008  ? Depression   ? Diverticulosis of colon   ? GERD (gastroesophageal reflux disease)   ? Gout   ? Hyperlipidemia   ? Hypertension   ? Joint pain   ? Leg pain   ? While walking  ? Leg weakness   ? Neck pain   ? Pleurisy   ? Shortness of breath dyspnea   ? with exertion- "I cant exercise now"  ? Tobacco abuse   ? a. 40 yrs, 1.5-3 ppd over that time.  ? ? ?Assessment/Plan: ?2 Days Post-Op Procedure(s) (LRB): ?TOTAL HIP ARTHROPLASTY ANTERIOR APPROACH (Left) ?Principal Problem: ?  OA (osteoarthritis) of hip ?Active Problems: ?  S/P total left hip arthroplasty ?  Osteoarthritis of left hip ? ?Estimated body mass index is 26.4 kg/m? as calculated from the following: ?  Height as of this encounter: 5\' 8"  (1.727 m). ?  Weight as of this encounter: 78.7 kg. ?Up with therapy ? ?DVT Prophylaxis - Aspirin ?Weight-bearing as tolerated ? ?Plan for  discharge after one session of PT this AM if pain controlled and meeting goals. ? ?Theresa Duty, PA-C ?Orthopedic Surgery ?[phone] ?09/23/2021, 7:48 AM ? ?

## 2021-09-23 NOTE — Plan of Care (Signed)
  Problem: Pain Management: Goal: Pain level will decrease with appropriate interventions Outcome: Progressing   Problem: Activity: Goal: Ability to avoid complications of mobility impairment will improve Outcome: Progressing Goal: Ability to tolerate increased activity will improve Outcome: Progressing   

## 2021-09-24 NOTE — Anesthesia Postprocedure Evaluation (Signed)
Anesthesia Post Note ? ?Patient: Aaron Black ? ?Procedure(s) Performed: TOTAL HIP ARTHROPLASTY ANTERIOR APPROACH (Left: Hip) ? ?  ? ?Patient location during evaluation: PACU ?Anesthesia Type: General ?Level of consciousness: awake and alert ?Pain management: pain level controlled ?Vital Signs Assessment: post-procedure vital signs reviewed and stable ?Respiratory status: spontaneous breathing, nonlabored ventilation, respiratory function stable and patient connected to nasal cannula oxygen ?Cardiovascular status: blood pressure returned to baseline and stable ?Postop Assessment: no apparent nausea or vomiting ?Anesthetic complications: no ? ? ?No notable events documented. ? ?Last Vitals:  ?Vitals:  ? 09/22/21 2222 09/23/21 0649  ?BP: (!) 149/72 (!) 162/89  ?Pulse: 77 65  ?Resp: 17 15  ?Temp: 36.7 ?C 36.6 ?C  ?SpO2: 97% 99%  ?  ?Last Pain:  ?Vitals:  ? 09/23/21 0900  ?TempSrc:   ?PainSc: 0-No pain  ? ?Pain Goal: Patients Stated Pain Goal: 0 (09/22/21 1908) ? ?  ?  ?  ?  ?  ?  ?  ? ?Cortasia Screws L [name] ? ? ? ? ?

## 2021-09-29 DIAGNOSIS — R490 Dysphonia: Secondary | ICD-10-CM | POA: Diagnosis not present

## 2021-09-29 DIAGNOSIS — C320 Malignant neoplasm of glottis: Secondary | ICD-10-CM | POA: Diagnosis not present

## 2021-09-29 DIAGNOSIS — Z923 Personal history of irradiation: Secondary | ICD-10-CM | POA: Diagnosis not present

## 2021-09-29 DIAGNOSIS — H9193 Unspecified hearing loss, bilateral: Secondary | ICD-10-CM | POA: Diagnosis not present

## 2021-09-29 DIAGNOSIS — Z87891 Personal history of nicotine dependence: Secondary | ICD-10-CM | POA: Diagnosis not present

## 2021-10-05 NOTE — Discharge Summary (Signed)
Patient ID: ?Aaron Black ?MRN: [id number] ?DOB/AGE: 02-13-1956 66 y.o. ? ?Admit date: 09/21/2021 ?Discharge date: 09/23/2021 ? ?Admission Diagnoses:  ?Principal Problem: ?  OA (osteoarthritis) of hip ?Active Problems: ?  S/P total left hip arthroplasty ?  Osteoarthritis of left hip ? ? ?Discharge Diagnoses:  ?Same ? ?Past Medical History:  ?Diagnosis Date  ? Anemia   ? Anxiety   ? Arm pain   ? Arthritis   ? B12 deficiency   ? Back pain   ? Cancer [facility])   ? throat cancer   ? Cellulitis of arm, left 10/2017  ? after cat scratch  ? Cervical disc disease   ? a. 07/2003 ant cervical deomcpression and fusion C5-6/C6-7;  b. 09/2007 post cervical laminectomy C4-5 with scres and arthrodesis C4-C7.  ? Chest pain at rest 02/25/2013  ? Chronic lower back pain   ? Complication of anesthesia   ? " i WOKE UP DURING A COLONOSCOPY "- 2008  ? Depression   ? Diverticulosis of colon   ? GERD (gastroesophageal reflux disease)   ? Gout   ? Hyperlipidemia   ? Hypertension   ? Joint pain   ? Leg pain   ? While walking  ? Leg weakness   ? Neck pain   ? Pleurisy   ? Shortness of breath dyspnea   ? with exertion- "I cant exercise now"  ? Tobacco abuse   ? a. 40 yrs, 1.5-3 ppd over that time.  ? ? ?Surgeries: Procedure(s): ?TOTAL HIP ARTHROPLASTY ANTERIOR APPROACH on 09/21/2021 ?  ?Consultants:  ? ?Discharged Condition: Improved ? ?Hospital Course: Aaron Black is an 66 y.o. male who was admitted 09/21/2021 for operative treatment ofOA (osteoarthritis) of hip. Patient has severe unremitting pain that affects sleep, daily activities, and work/hobbies. After pre-op clearance the patient was taken to the operating room on 09/21/2021 and underwent  Procedure(s): ?TOTAL HIP ARTHROPLASTY ANTERIOR APPROACH.   ? ?Patient was given perioperative antibiotics:  ?Anti-infectives (From admission, onward)  ? ? Start     Dose/Rate Route Frequency Ordered Stop  ? 09/21/21 2000  ceFAZolin (ANCEF) IVPB 2g/100 mL premix       ? 2 g ?200 mL/hr over 30 Minutes Intravenous  Every 6 hours 09/21/21 1557 09/22/21 0201  ? 09/21/21 1115  ceFAZolin (ANCEF) IVPB 2g/100 mL premix       ? 2 g ?200 mL/hr over 30 Minutes Intravenous On call to O.R. 09/21/21 1114 09/21/21 1340  ? ?  ?  ? ?Patient was given sequential compression devices, early ambulation, and chemoprophylaxis to prevent DVT. ? ?Patient benefited maximally from hospital stay and there were no complications.   ? ?Recent vital signs: No data found.  ? ?Recent laboratory studies: No results for input(s): WBC, HGB, HCT, PLT, NA, K, CL, CO2, BUN, CREATININE, GLUCOSE, INR, CALCIUM in the last 72 hours. ? ?Invalid input(s): PT, 2 ? ? ?Discharge Medications:   ?Allergies as of 09/23/2021   ? ?   Reactions  ? Lisinopril Swelling  ? Swollen Lip  ? Percodan [oxycodone-aspirin] Rash  ? Percodan caused rash---but he can tolerate Percocet and aspirin on their own   ? ?  ? ?  ?Medication List  ?  ? ?TAKE these medications   ? ?albuterol 108 (90 Base) MCG/ACT inhaler ?Commonly known as: VENTOLIN HFA ?Inhale 1 puff into the lungs every 4 (four) hours as needed for wheezing or shortness of breath. ?  ?allopurinol 100 MG tablet ?Commonly known as: ZYLOPRIM ?Take  1 tablet (100 mg total) by mouth daily. ?What changed: when to take this ?  ?aspirin 325 MG EC tablet ?Take 1 tablet (325 mg total) by mouth 2 (two) times daily for 20 days. Then take one 81 mg aspirin once a day for three weeks. Then discontinue aspirin. ?  ?atorvastatin 80 MG tablet ?Commonly known as: LIPITOR ?Take 1 tablet (80 mg total) by mouth daily. ?What changed: when to take this ?  ?colchicine 0.6 MG tablet ?TAKE 1 TABLET (0.6 MG TOTAL) BY MOUTH DAILY AS NEEDED (GOUT). ?  ?cyclobenzaprine 10 MG tablet ?Commonly known as: FLEXERIL ?Take 1 tablet (10 mg total) by mouth 3 (three) times daily. ?What changed: when to take this ?  ?diltiazem 360 MG 24 hr capsule ?Commonly known as: CARDIZEM CD ?TAKE 1 CAPSULE BY MOUTH EVERY DAY ?What changed:  ?how much to take ?how to take this ?when to  take this ?additional instructions ?  ?gabapentin 300 MG capsule ?Commonly known as: NEURONTIN ?Take 300 mg by mouth in the morning, at noon, in the evening, and at bedtime. ?  ?losartan 100 MG tablet ?Commonly known as: COZAAR ?Take 1 tablet (100 mg total) by mouth daily. ?What changed: when to take this ?  ?omeprazole 40 MG capsule ?Commonly known as: PRILOSEC ?Take 1 capsule (40 mg total) by mouth daily. ?What changed: when to take this ?  ?oxyCODONE 5 MG immediate release tablet ?Commonly known as: Oxy IR/ROXICODONE ?Take 1 tablet (5 mg total) by mouth every 6 (six) hours as needed for breakthrough pain (Breakthrough pain not responsive to chronic 10-325 Percocet Q4). ?  ?oxyCODONE-acetaminophen 10-325 MG tablet ?Commonly known as: PERCOCET ?Take 1 tablet by mouth every 4 (four) hours as needed for pain. ?What changed: Another medication with the same name was removed. Continue taking this medication, and follow the directions you see here. ?  ?sertraline 50 MG tablet ?Commonly known as: ZOLOFT ?TAKE 1 TABLET BY MOUTH EVERY DAY ?What changed: Another medication with the same name was changed. Make sure you understand how and when to take each. ?  ?sertraline 100 MG tablet ?Commonly known as: ZOLOFT ?Take 1 tablet (100 mg total) by mouth in the morning. ?What changed: how much to take ?  ?vitamin B-12 1000 MCG tablet ?Commonly known as: CYANOCOBALAMIN ?Take 1,000 mcg by mouth in the morning. ?  ? ?  ? ?  ?  ? ? ?  ?Discharge Care Instructions  ?(From admission, onward)  ?  ? ? ?  ? ?  Start     Ordered  ? 09/22/21 0000  Weight bearing as tolerated       ? 09/22/21 0827  ? 09/22/21 0000  Change dressing       ?Comments: You have an adhesive waterproof bandage over the incision. Leave this in place until your first follow-up appointment. Once you remove this you will not need to place another bandage.  ? 09/22/21 0827  ? ?  ?  ? ?  ? ? ?Diagnostic Studies: DG Pelvis Portable ? ?Result Date: 09/21/2021 ?CLINICAL DATA:   Post hip replacement EXAM: PORTABLE PELVIS 1-2 VIEWS COMPARISON:  None. FINDINGS: Left total hip arthroplasty. No evidence of complication on this image. IMPRESSION: Post left total hip arthroplasty. Electronically Signed   By: Macy Mis M.D.   On: 09/21/2021 16:20  ? ?DG C-Arm 1-60 Min-No Report ? ?Result Date: 09/21/2021 ?Fluoroscopy was utilized by the requesting physician.  No radiographic interpretation.  ? ?DG HIP UNILAT WITH  PELVIS 1V LEFT ? ?Result Date: 09/21/2021 ?CLINICAL DATA:  Left hip arthroplasty EXAM: DG HIP (WITH OR WITHOUT PELVIS) 1V*L* COMPARISON:  None. FINDINGS: Multiple intraoperative fluoroscopic spot images are provided. Interval left total hip arthroplasty. Normal alignment. FLUOROSCOPY TIME:  Fluoroscopy Time:  4 seconds Radiation Exposure Index (if provided by the fluoroscopic device): 0.6 mGy Number of Acquired Spot Images: 0 IMPRESSION: 1. Interval left total hip arthroplasty. Electronically Signed   By: Kathreen Devoid M.D.   On: 09/21/2021 15:26   ? ?Disposition: Discharge disposition: 01-Home or Self Care ? ? ? ? ? ? ?Discharge Instructions   ? ? Call MD / Call 911   Complete by: As directed ?  ? If you experience chest pain or shortness of breath, CALL 911 and be transported to the hospital emergency room.  If you develope a fever above 101 F, pus (white drainage) or increased drainage or redness at the wound, or calf pain, call your surgeon's office.  ? Change dressing   Complete by: As directed ?  ? You have an adhesive waterproof bandage over the incision. Leave this in place until your first follow-up appointment. Once you remove this you will not need to place another bandage.  ? Constipation Prevention   Complete by: As directed ?  ? Drink plenty of fluids.  Prune juice may be helpful.  You may use a stool softener, such as Colace (over the counter) 100 mg twice a day.  Use MiraLax (over the counter) for constipation as needed.  ? Diet - low sodium heart healthy   Complete by:  As directed ?  ? Do not sit on low chairs, stoools or toilet seats, as it may be difficult to get up from low surfaces   Complete by: As directed ?  ? Driving restrictions   Complete by: As directed ?

## 2021-10-17 ENCOUNTER — Ambulatory Visit (INDEPENDENT_AMBULATORY_CARE_PROVIDER_SITE_OTHER): Payer: 59 | Admitting: Family Medicine

## 2021-10-17 ENCOUNTER — Encounter: Payer: Self-pay | Admitting: Family Medicine

## 2021-10-17 VITALS — BP 142/72 | HR 68 | Temp 98.0°F | Ht 68.0 in | Wt 174.7 lb

## 2021-10-17 DIAGNOSIS — R062 Wheezing: Secondary | ICD-10-CM | POA: Diagnosis not present

## 2021-10-17 DIAGNOSIS — R051 Acute cough: Secondary | ICD-10-CM | POA: Diagnosis not present

## 2021-10-17 MED ORDER — ALBUTEROL SULFATE HFA 108 (90 BASE) MCG/ACT IN AERS: 1.0000 | INHALATION_SPRAY | RESPIRATORY_TRACT | 5 refills | Status: DC | PRN

## 2021-10-17 MED ORDER — PREDNISONE 20 MG PO TABS: ORAL_TABLET | ORAL | 0 refills | Status: DC

## 2021-10-17 MED ORDER — AMOXICILLIN-POT CLAVULANATE 875-125 MG PO TABS: 1.0000 | ORAL_TABLET | Freq: Two times a day (BID) | ORAL | 0 refills | Status: DC

## 2021-10-17 NOTE — Progress Notes (Signed)
? ?Established Patient Office Visit ? ?Subjective:  ?Patient ID: Aaron Black, male    DOB: [birth year]  Age: 66 y.o. MRN: [id number] ? ?CC:  ?Chief Complaint  ?Patient presents with  ? Cough  ?  X1 week   ? Wheezing  ?  X1 week  ? Nasal Congestion  ? Shortness of Breath  ? ? ?HPI ?Aaron Black presents for 1 week history of cough and nasal congestion.  Cough occasionally productive of brown sputum.  He does have history of smoking but quit 2019.  He is aware of some wheezing and intermittent shortness of breath.  No chest pain.  No fever.  No hemoptysis.  No sick contacts.  He has leftover albuterol inhaler but almost empty.  Requesting refill.  Does participate in CT lung cancer screening with most recent scan back in April.  Suggestion of emphysema on last scan. ? ?Past Medical History:  ?Diagnosis Date  ? Anemia   ? Anxiety   ? Arm pain   ? Arthritis   ? B12 deficiency   ? Back pain   ? Cancer [facility])   ? throat cancer   ? Cellulitis of arm, left 10/2017  ? after cat scratch  ? Cervical disc disease   ? a. 07/2003 ant cervical deomcpression and fusion C5-6/C6-7;  b. 09/2007 post cervical laminectomy C4-5 with scres and arthrodesis C4-C7.  ? Chest pain at rest 02/25/2013  ? Chronic lower back pain   ? Complication of anesthesia   ? " i WOKE UP DURING A COLONOSCOPY "- 2008  ? Depression   ? Diverticulosis of colon   ? GERD (gastroesophageal reflux disease)   ? Gout   ? Hyperlipidemia   ? Hypertension   ? Joint pain   ? Leg pain   ? While walking  ? Leg weakness   ? Neck pain   ? Pleurisy   ? Shortness of breath dyspnea   ? with exertion- "I cant exercise now"  ? Tobacco abuse   ? a. 40 yrs, 1.5-3 ppd over that time.  ? ? ?Past Surgical History:  ?Procedure Laterality Date  ? ABDOMINAL EXPOSURE N/A 01/03/2021  ? Procedure: ABDOMINAL EXPOSURE;  Surgeon: [name], MD;  Location: [place];  Service: Vascular;  Laterality: N/A;  ? ANTERIOR LAT LUMBAR FUSION Left 01/04/2021  ? Procedure: Removal Of Broken  Hardware;  Surgeon: Erline Levine, MD;  Location: [place];  Service: Neurosurgery;  Laterality: Left;  ? ANTERIOR LATERAL LUMBAR FUSION 4 LEVELS Right 06/05/2014  ? Procedure: ANTERIOR LATERAL LUMBAR FUSION  LUMBAR ONE TO LUMBAR FIVE with Percutaneous Pedicle Screws;  Surgeon: Erline Levine, MD;  Location: [place] [facility];  Service: Neurosurgery;  Laterality: Right;  ? ANTERIOR LUMBAR FUSION N/A 01/03/2021  ? Procedure: Lumbar Four-Five Anterior lumbar interbody fusion with revision of graft;  Surgeon: Erline Levine, MD;  Location: [place];  Service: Neurosurgery;  Laterality: N/A;  Lumbar Four-Five Anterior lumbar interbody fusion with revision of graft  ? CARPAL TUNNEL RELEASE Right   ? CERVICAL DISC SURGERY    ? X 2  ? COLONOSCOPY    ? ELBOW ARTHROSCOPY Right   ? KNEE ARTHROSCOPY Right 08/2013  ? torn menicus  ? KNEE ARTHROSCOPY Left 11/2013  ? chip [place],torn menicus  ? LUMBAR LAMINECTOMY/DECOMPRESSION MICRODISCECTOMY Right 07/11/2013  ? Procedure: LUMBAR LAMINECTOMY/DECOMPRESSION MICRODISCECTOMY 1 LEVEL;  Surgeon: Erline Levine, MD;  Location: [place] [facility];  Service: Neurosurgery;  Laterality: Right;  Right L34 microdiskectomy  ?  LUMBAR PERCUTANEOUS PEDICLE SCREW 4 LEVEL N/A 06/05/2014  ? Procedure: LUMBAR PERCUTANEOUS PEDICLE SCREW 4 LEVEL;  Surgeon: Erline Levine, MD;  Location: [place] [facility];  Service: Neurosurgery;  Laterality: N/A;  ? MICROLARYNGOSCOPY Left 05/12/2019  ? Procedure: MICROLARYNGOSCOPY WITH BIOPSY;  Surgeon: Rozetta Nunnery, MD;  Location: [facility];  Service: ENT;  Laterality: Left;  ? NECK SURGERY  January 2005, March 2009  ? C-Spine  ? POLYPECTOMY    ? ROTATOR CUFF REPAIR  August 2000, January 2002, July 2008, July 2009  ? 2 left 2 right  ? TOTAL HIP ARTHROPLASTY Left 09/21/2021  ? Procedure: TOTAL HIP ARTHROPLASTY ANTERIOR APPROACH;  Surgeon: Gaynelle Arabian, MD;  Location: [facility];  Service: Orthopedics;  Laterality: Left;  ? ? ?Family History  ?Problem Relation Age of Onset  ?  Bone cancer Mother   ? Squamous cell carcinoma Mother   ?     died @ 52  ? Heart attack Mother   ? Hypertension Father   ? Diabetes Father   ?     borderline  ? Congestive Heart Failure Father   ?     alive @ 41  ? Heart failure Father   ? Sudden death Brother   ?     died @ 53  ? Other Brother   ?     died in [place] @ 30 - struck by drunk driver  ? Other Brother   ?     died in [place] @ 23 - struck by drunk driver  ? Heart attack Brother   ? Stroke Paternal Grandfather   ? Colon cancer Neg Hx   ? Esophageal cancer Neg Hx   ? Rectal cancer Neg Hx   ? Stomach cancer Neg Hx   ? Colon polyps Neg Hx   ? ? ?Social History  ? ?Socioeconomic History  ? Marital status: Married  ?  Spouse name: Not on file  ? Number of children: Not on file  ? Years of education: Not on file  ? Highest education level: Not on file  ?Occupational History  ? Not on file  ?Tobacco Use  ? Smoking status: Former  ?  Packs/day: 1.00  ?  Years: 42.00  ?  Pack years: 42.00  ?  Types: Cigarettes  ?  Start date: 43  ?  Quit date: 06/08/2019  ?  Years since quitting: 2.3  ? Smokeless tobacco: Never  ? Tobacco comments:  ?  He is smoking about 4-5 cigarettes, He had smoked up to 3 packs daily. 06/03/19  ?Vaping Use  ? Vaping Use: Never used  ?Substance and Sexual Activity  ? Alcohol use: Yes  ?  Comment: occasional  ? Drug use: No  ? Sexual activity: Not on file  ?Other Topics Concern  ? Not on file  ?Social History Narrative  ? Lives in [place] with wife.  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  ? ?Social Determinants of Health  ? ?Financial Resource Strain: Not on file  ?Food Insecurity: Not on file  ?Transportation Needs: Not on file  ?Physical Activity: Not on file  ?Stress: Not on file  ?Social Connections: Not on file  ?Intimate Partner Violence: Not on file  ? ? ?Outpatient Medications Prior to Visit  ?Medication Sig Dispense Refill  ? allopurinol (ZYLOPRIM) 100 MG tablet Take 1 tablet (100 mg total) by mouth daily. (Patient  taking differently: Take 100 mg by mouth in the  morning.) 90 tablet 3  ? atorvastatin (LIPITOR) 80 MG tablet Take 1 tablet (80 mg total) by mouth daily. (Patient taking differently: Take 80 mg by mouth in the morning.) 90 tablet 3  ? colchicine 0.6 MG tablet TAKE 1 TABLET (0.6 MG TOTAL) BY MOUTH DAILY AS NEEDED (GOUT). 90 tablet 1  ? cyclobenzaprine (FLEXERIL) 10 MG tablet Take 1 tablet (10 mg total) by mouth 3 (three) times daily. 30 tablet 0  ? diltiazem (CARDIZEM CD) 360 MG 24 hr capsule TAKE 1 CAPSULE BY MOUTH EVERY DAY (Patient taking differently: Take 360 mg by mouth in the morning.) 90 capsule 3  ? gabapentin (NEURONTIN) 300 MG capsule Take 300 mg by mouth in the morning, at noon, in the evening, and at bedtime.    ? omeprazole (PRILOSEC) 40 MG capsule Take 1 capsule (40 mg total) by mouth daily. (Patient taking differently: Take 40 mg by mouth in the morning.) 90 capsule 3  ? oxyCODONE (OXY IR/ROXICODONE) 5 MG immediate release tablet Take 1 tablet (5 mg total) by mouth every 6 (six) hours as needed for breakthrough pain (Breakthrough pain not responsive to chronic 10-325 Percocet Q4). 28 tablet 0  ? oxyCODONE-acetaminophen (PERCOCET) 10-325 MG tablet Take 1 tablet by mouth every 4 (four) hours as needed for pain.    ? sertraline (ZOLOFT) 100 MG tablet Take 1 tablet (100 mg total) by mouth in the morning. (Patient taking differently: Take 50 mg by mouth in the morning.) 90 tablet 1  ? sertraline (ZOLOFT) 50 MG tablet TAKE 1 TABLET BY MOUTH EVERY DAY 90 tablet 1  ? vitamin B-12 (CYANOCOBALAMIN) 1000 MCG tablet Take 1,000 mcg by mouth in the morning.    ? albuterol (VENTOLIN HFA) 108 (90 Base) MCG/ACT inhaler Inhale 1 puff into the lungs every 4 (four) hours as needed for wheezing or shortness of breath.    ? losartan (COZAAR) 100 MG tablet Take 1 tablet (100 mg total) by mouth daily. (Patient taking differently: Take 100 mg by mouth in the morning.) 90 tablet 3  ? ?No facility-administered medications prior  to visit.  ? ? ?Allergies  ?Allergen Reactions  ? Lisinopril Swelling  ?  Swollen Lip  ? Percodan [Oxycodone-Aspirin] Rash  ?  Percodan caused rash---but he can tolerate Percocet and aspirin on their own   ? ?

## 2021-10-17 NOTE — Patient Instructions (Signed)
Start the antibiotic and the prednisone   ? ?Let me know if not better in one week.   ?

## 2021-10-19 DIAGNOSIS — M47816 Spondylosis without myelopathy or radiculopathy, lumbar region: Secondary | ICD-10-CM | POA: Diagnosis not present

## 2021-10-19 DIAGNOSIS — M545 Low back pain, unspecified: Secondary | ICD-10-CM | POA: Diagnosis not present

## 2021-10-19 DIAGNOSIS — M5416 Radiculopathy, lumbar region: Secondary | ICD-10-CM | POA: Diagnosis not present

## 2021-10-25 ENCOUNTER — Other Ambulatory Visit: Payer: Self-pay | Admitting: Adult Health

## 2021-10-25 DIAGNOSIS — I10 Essential (primary) hypertension: Secondary | ICD-10-CM

## 2021-10-25 DIAGNOSIS — E785 Hyperlipidemia, unspecified: Secondary | ICD-10-CM

## 2021-10-25 NOTE — Telephone Encounter (Signed)
Patient need to schedule an ov for more refills. 

## 2021-11-10 DIAGNOSIS — Z96653 Presence of artificial knee joint, bilateral: Secondary | ICD-10-CM | POA: Diagnosis not present

## 2021-11-10 DIAGNOSIS — Z96642 Presence of left artificial hip joint: Secondary | ICD-10-CM | POA: Diagnosis not present

## 2021-11-10 DIAGNOSIS — Z471 Aftercare following joint replacement surgery: Secondary | ICD-10-CM | POA: Diagnosis not present

## 2021-11-14 ENCOUNTER — Other Ambulatory Visit: Payer: Self-pay | Admitting: Adult Health

## 2021-11-28 DIAGNOSIS — E663 Overweight: Secondary | ICD-10-CM | POA: Diagnosis not present

## 2021-11-28 DIAGNOSIS — Z6825 Body mass index (BMI) 25.0-25.9, adult: Secondary | ICD-10-CM | POA: Diagnosis not present

## 2021-11-28 DIAGNOSIS — M1A09X0 Idiopathic chronic gout, multiple sites, without tophus (tophi): Secondary | ICD-10-CM | POA: Diagnosis not present

## 2021-11-28 DIAGNOSIS — M1991 Primary osteoarthritis, unspecified site: Secondary | ICD-10-CM | POA: Diagnosis not present

## 2021-11-28 DIAGNOSIS — M5030 Other cervical disc degeneration, unspecified cervical region: Secondary | ICD-10-CM | POA: Diagnosis not present

## 2021-11-28 DIAGNOSIS — C140 Malignant neoplasm of pharynx, unspecified: Secondary | ICD-10-CM | POA: Diagnosis not present

## 2021-11-29 ENCOUNTER — Other Ambulatory Visit: Payer: Self-pay | Admitting: Adult Health

## 2021-11-30 ENCOUNTER — Other Ambulatory Visit: Payer: Self-pay | Admitting: Adult Health

## 2021-11-30 DIAGNOSIS — I10 Essential (primary) hypertension: Secondary | ICD-10-CM

## 2021-12-08 ENCOUNTER — Other Ambulatory Visit: Payer: Self-pay | Admitting: *Deleted

## 2021-12-08 DIAGNOSIS — Z87891 Personal history of nicotine dependence: Secondary | ICD-10-CM

## 2021-12-08 DIAGNOSIS — Z122 Encounter for screening for malignant neoplasm of respiratory organs: Secondary | ICD-10-CM

## 2021-12-08 NOTE — Progress Notes (Signed)
Chest c 

## 2021-12-09 ENCOUNTER — Other Ambulatory Visit: Payer: Self-pay | Admitting: Adult Health

## 2021-12-09 DIAGNOSIS — I10 Essential (primary) hypertension: Secondary | ICD-10-CM

## 2021-12-09 DIAGNOSIS — E785 Hyperlipidemia, unspecified: Secondary | ICD-10-CM

## 2021-12-09 NOTE — Telephone Encounter (Signed)
Pt is due for CPE 

## 2021-12-22 ENCOUNTER — Ambulatory Visit (INDEPENDENT_AMBULATORY_CARE_PROVIDER_SITE_OTHER)
Admission: RE | Admit: 2021-12-22 | Discharge: 2021-12-22 | Disposition: A | Discharge status: Home / Self Care | Payer: 59 | Source: Ambulatory Visit | Attending: Acute Care | Admitting: Acute Care

## 2021-12-22 DIAGNOSIS — Z122 Encounter for screening for malignant neoplasm of respiratory organs: Secondary | ICD-10-CM

## 2021-12-22 DIAGNOSIS — Z87891 Personal history of nicotine dependence: Secondary | ICD-10-CM | POA: Diagnosis not present

## 2021-12-23 ENCOUNTER — Other Ambulatory Visit: Payer: Self-pay

## 2021-12-23 DIAGNOSIS — Z122 Encounter for screening for malignant neoplasm of respiratory organs: Secondary | ICD-10-CM

## 2021-12-23 DIAGNOSIS — Z87891 Personal history of nicotine dependence: Secondary | ICD-10-CM

## 2021-12-26 ENCOUNTER — Other Ambulatory Visit: Payer: Self-pay | Admitting: Adult Health

## 2021-12-26 DIAGNOSIS — E785 Hyperlipidemia, unspecified: Secondary | ICD-10-CM

## 2021-12-26 DIAGNOSIS — I10 Essential (primary) hypertension: Secondary | ICD-10-CM

## 2021-12-27 NOTE — Telephone Encounter (Signed)
Pt needs a f/u visit with Dr[facility] for further refills

## 2022-01-20 ENCOUNTER — Other Ambulatory Visit: Payer: Self-pay | Admitting: Adult Health

## 2022-01-20 DIAGNOSIS — I10 Essential (primary) hypertension: Secondary | ICD-10-CM

## 2022-01-20 DIAGNOSIS — E785 Hyperlipidemia, unspecified: Secondary | ICD-10-CM

## 2022-01-23 DIAGNOSIS — L570 Actinic keratosis: Secondary | ICD-10-CM | POA: Diagnosis not present

## 2022-01-23 DIAGNOSIS — L821 Other seborrheic keratosis: Secondary | ICD-10-CM | POA: Diagnosis not present

## 2022-01-23 DIAGNOSIS — D225 Melanocytic nevi of trunk: Secondary | ICD-10-CM | POA: Diagnosis not present

## 2022-01-23 DIAGNOSIS — L814 Other melanin hyperpigmentation: Secondary | ICD-10-CM | POA: Diagnosis not present

## 2022-01-27 ENCOUNTER — Ambulatory Visit (INDEPENDENT_AMBULATORY_CARE_PROVIDER_SITE_OTHER): Payer: 59 | Admitting: Adult Health

## 2022-01-27 ENCOUNTER — Encounter: Payer: Self-pay | Admitting: Adult Health

## 2022-01-27 VITALS — BP 180/120 | HR 97 | Temp 98.5°F | Ht 68.0 in | Wt 168.0 lb

## 2022-01-27 DIAGNOSIS — E785 Hyperlipidemia, unspecified: Secondary | ICD-10-CM

## 2022-01-27 DIAGNOSIS — F419 Anxiety disorder, unspecified: Secondary | ICD-10-CM

## 2022-01-27 DIAGNOSIS — M1000 Idiopathic gout, unspecified site: Secondary | ICD-10-CM

## 2022-01-27 DIAGNOSIS — C320 Malignant neoplasm of glottis: Secondary | ICD-10-CM | POA: Diagnosis not present

## 2022-01-27 DIAGNOSIS — I10 Essential (primary) hypertension: Secondary | ICD-10-CM

## 2022-01-27 DIAGNOSIS — R69 Illness, unspecified: Secondary | ICD-10-CM | POA: Diagnosis not present

## 2022-01-27 DIAGNOSIS — Z0000 Encounter for general adult medical examination without abnormal findings: Secondary | ICD-10-CM | POA: Diagnosis not present

## 2022-01-27 DIAGNOSIS — Z125 Encounter for screening for malignant neoplasm of prostate: Secondary | ICD-10-CM

## 2022-01-27 DIAGNOSIS — N529 Male erectile dysfunction, unspecified: Secondary | ICD-10-CM | POA: Diagnosis not present

## 2022-01-27 LAB — COMPREHENSIVE METABOLIC PANEL
ALT: 19 U/L (ref 0–53)
AST: 23 U/L (ref 0–37)
Albumin: 4.6 g/dL (ref 3.5–5.2)
Alkaline Phosphatase: 99 U/L (ref 39–117)
BUN: 12 mg/dL (ref 6–23)
CO2: 26 mEq/L (ref 19–32)
Calcium: 9.7 mg/dL (ref 8.4–10.5)
Chloride: 96 mEq/L (ref 96–112)
Creatinine, Ser: 0.96 mg/dL (ref 0.40–1.50)
GFR: 83.53 mL/min (ref >60.00)
Glucose, Bld: 93 mg/dL (ref 70–99)
Potassium: 5 mEq/L (ref 3.5–5.1)
Sodium: 130 mEq/L — ABNORMAL LOW (ref 135–145)
Total Bilirubin: 0.4 mg/dL (ref 0.2–1.2)
Total Protein: 7.2 g/dL (ref 6.0–8.3)

## 2022-01-27 LAB — CBC WITH DIFFERENTIAL/PLATELET
Basophils Absolute: 0 10*3/uL (ref 0.0–0.1)
Basophils Relative: 0.7 % (ref 0.0–3.0)
Eosinophils Absolute: 0.1 10*3/uL (ref 0.0–0.7)
Eosinophils Relative: 1.3 % (ref 0.0–5.0)
HCT: 38 % — ABNORMAL LOW (ref 39.0–52.0)
Hemoglobin: 12.6 g/dL — ABNORMAL LOW (ref 13.0–17.0)
Lymphocytes Relative: 19.7 % (ref 12.0–46.0)
Lymphs Abs: 1.3 10*3/uL (ref 0.7–4.0)
MCHC: 33.1 g/dL (ref 30.0–36.0)
MCV: 85.7 fl (ref 78.0–100.0)
Monocytes Absolute: 0.6 10*3/uL (ref 0.1–1.0)
Monocytes Relative: 8.6 % (ref 3.0–12.0)
Neutro Abs: 4.6 10*3/uL (ref 1.4–7.7)
Neutrophils Relative %: 69.7 % (ref 43.0–77.0)
Platelets: 234 10*3/uL (ref 150.0–400.0)
RBC: 4.44 Mil/uL (ref 4.22–5.81)
RDW: 16.2 % — ABNORMAL HIGH (ref 11.5–15.5)
WBC: 6.5 10*3/uL (ref 4.0–10.5)

## 2022-01-27 LAB — PSA: PSA: 0.75 ng/mL (ref 0.10–4.00)

## 2022-01-27 LAB — LIPID PANEL
Cholesterol: 165 mg/dL (ref 0–200)
HDL: 71.7 mg/dL (ref >39.00)
LDL Cholesterol: 81 mg/dL (ref 0–99)
NonHDL: 93.79
Total CHOL/HDL Ratio: 2
Triglycerides: 65 mg/dL (ref 0.0–149.0)
VLDL: 13 mg/dL (ref 0.0–40.0)

## 2022-01-27 LAB — TSH: TSH: 1.06 u[IU]/mL (ref 0.35–5.50)

## 2022-01-27 LAB — HEMOGLOBIN A1C: Hgb A1c MFr Bld: 5.8 % (ref 4.6–6.5)

## 2022-01-27 MED ORDER — SILDENAFIL CITRATE 50 MG PO TABS: 50.0000 mg | ORAL_TABLET | Freq: Every day | ORAL | 2 refills | Status: DC | PRN

## 2022-01-27 MED ORDER — AMLODIPINE BESYLATE 5 MG PO TABS: 5.0000 mg | ORAL_TABLET | Freq: Every day | ORAL | 0 refills | Status: DC

## 2022-01-27 MED ORDER — DILTIAZEM HCL ER COATED BEADS 360 MG PO CP24: 360.0000 mg | ORAL_CAPSULE | Freq: Every day | ORAL | 3 refills | Status: DC

## 2022-01-27 MED ORDER — ATORVASTATIN CALCIUM 80 MG PO TABS: 80.0000 mg | ORAL_TABLET | Freq: Every day | ORAL | 3 refills | Status: DC

## 2022-01-27 MED ORDER — ALLOPURINOL 100 MG PO TABS: 100.0000 mg | ORAL_TABLET | Freq: Every morning | ORAL | 3 refills | Status: DC

## 2022-01-27 MED ORDER — ALPRAZOLAM 0.5 MG PO TABS: 0.5000 mg | ORAL_TABLET | Freq: Three times a day (TID) | ORAL | 2 refills | Status: AC

## 2022-01-27 NOTE — Patient Instructions (Signed)
It was great seeing you today   I have added a blood pressure medication to your regimen called Norvasc.   Please follow up in one month or sooner if needed  We will follow up with you regarding your labs

## 2022-01-27 NOTE — Progress Notes (Signed)
Subjective:    Patient ID: Aaron Black, male    DOB: [birth year], 66 y.o.   MRN: [id number]  HPI 66 year old male who  has a past medical history of Anemia, Anxiety, Arm pain, Arthritis, B12 deficiency, Back pain, Cancer (HCC), Cellulitis of arm, left (10/2017), Cervical disc disease, Chest pain at rest (02/25/2013), Chronic lower back pain, Complication of anesthesia, Depression, Diverticulosis of colon, GERD (gastroesophageal reflux disease), Gout, Hyperlipidemia, Hypertension, Joint pain, Leg pain, Leg weakness, Neck pain, Pleurisy, Shortness of breath dyspnea, and Tobacco abuse.  History of glottis carcinoma-diagnosed in 2020.  He completed radiation treatment.  Currently doing well and has no complaints.  He follows up with oncology and ENT on a regular basis  Anxiety-well-controlled with Zoloft 50 mg daily and xanax  0.5 mg TID   Hypertension-takes Cozaar 100 mg and Cardizem 360 mg ER daily.  Denies dizziness, lightheadedness, chest pain or shortness of breath. He did not take his medications this morning. Reports readings at home with readings in the 150-160/80-90's.   History of gout-controlled with allopurinol 100 mg daily.  He denies any acute gout flares  ED - has trouble maintaining and getting an erection. He would like to try viagra.     Review of Systems  Constitutional: Negative.   HENT: Negative.    Eyes: Negative.   Respiratory: Negative.    Cardiovascular: Negative.   Gastrointestinal: Negative.   Endocrine: Negative.   Genitourinary: Negative.   Musculoskeletal:  Positive for arthralgias and back pain.  Skin: Negative.   Allergic/Immunologic: Negative.   Neurological: Negative.   Hematological: Negative.   Psychiatric/Behavioral: Negative.    All other systems reviewed and are negative.  Past Medical History:  Diagnosis Date   Anemia    Anxiety    Arm pain    Arthritis    B12 deficiency    Back pain    Cancer (HCC)    throat cancer    Cellulitis of  arm, left 10/2017   after cat scratch   Cervical disc disease    a. 07/2003 ant cervical deomcpression and fusion C5-6/C6-7;  b. 09/2007 post cervical laminectomy C4-5 with scres and arthrodesis C4-C7.   Chest pain at rest 02/25/2013   Chronic lower back pain    Complication of anesthesia    " i WOKE UP DURING A COLONOSCOPY "- 2008   Depression    Diverticulosis of colon    GERD (gastroesophageal reflux disease)    Gout    Hyperlipidemia    Hypertension    Joint pain    Leg pain    While walking   Leg weakness    Neck pain    Pleurisy    Shortness of breath dyspnea    with exertion- "I cant exercise now"   Tobacco abuse    a. 40 yrs, 1.5-3 ppd over that time.    Social History   Socioeconomic History   Marital status: Married    Spouse name: Not on file   Number of children: Not on file   Years of education: Not on file   Highest education level: Not on file  Occupational History   Not on file  Tobacco Use   Smoking status: Former    Packs/day: 1.00    Years: 42.00    Total pack years: 42.00    Types: Cigarettes    Start date: 75    Quit date: 06/08/2019    Years since quitting: 2.6   Smokeless tobacco:  Never   Tobacco comments:    He is smoking about 4-5 cigarettes, He had smoked up to 3 packs daily. 06/03/19  Vaping Use   Vaping Use: Never used  Substance and Sexual Activity   Alcohol use: Yes    Comment: occasional   Drug use: No   Sexual activity: Not on file  Other Topics Concern   Not on file  Social History Narrative   [place].  Does not work (retired) Previously read [employer] for city [employer] [place].   Social Determinants of Health   Financial Resource Strain: Not on file  Food Insecurity: Not on file  Transportation Needs: No Transportation Needs (06/03/2019)   PRAPARE - Hydrologist (Medical): No    Lack of Transportation (Non-Medical): No  Physical Activity: Not on file  Stress: Not on file  Social  Connections: Not on file  Intimate Partner Violence: Not At Risk (06/03/2019)   Humiliation, Afraid, Rape, and Kick questionnaire    Fear of Current or Ex-Partner: No    Emotionally Abused: No    Physically Abused: No    Sexually Abused: No    Past Surgical History:  Procedure Laterality Date   ABDOMINAL EXPOSURE N/A 01/03/2021   Procedure: ABDOMINAL EXPOSURE;  Surgeon: [name], MD;  Location: [facility];  Service: Vascular;  Laterality: N/A;   ANTERIOR LAT LUMBAR FUSION Left 01/04/2021   Procedure: Removal Of Broken Hardware;  Surgeon: Erline Levine, MD;  Location: [facility];  Service: Neurosurgery;  Laterality: Left;   ANTERIOR LATERAL LUMBAR FUSION 4 LEVELS Right 06/05/2014   Procedure: ANTERIOR LATERAL LUMBAR FUSION  LUMBAR ONE TO LUMBAR FIVE with Percutaneous Pedicle Screws;  Surgeon: Erline Levine, MD;  Location: [facility];  Service: Neurosurgery;  Laterality: Right;   ANTERIOR LUMBAR FUSION N/A 01/03/2021   Procedure: Lumbar Four-Five Anterior lumbar interbody fusion with revision of graft;  Surgeon: Erline Levine, MD;  Location: [facility];  Service: Neurosurgery;  Laterality: N/A;  Lumbar Four-Five Anterior lumbar interbody fusion with revision of graft   CARPAL TUNNEL RELEASE Right    CERVICAL DISC SURGERY     X 2   COLONOSCOPY     ELBOW ARTHROSCOPY Right    KNEE ARTHROSCOPY Right 08/2013   torn menicus   KNEE ARTHROSCOPY Left 11/2013   chip [place],torn menicus   LUMBAR LAMINECTOMY/DECOMPRESSION MICRODISCECTOMY Right 07/11/2013   Procedure: LUMBAR LAMINECTOMY/DECOMPRESSION MICRODISCECTOMY 1 LEVEL;  Surgeon: Erline Levine, MD;  Location: [facility];  Service: Neurosurgery;  Laterality: Right;  Right L34 microdiskectomy   LUMBAR PERCUTANEOUS PEDICLE SCREW 4 LEVEL N/A 06/05/2014   Procedure: LUMBAR PERCUTANEOUS PEDICLE SCREW 4 LEVEL;  Surgeon: Erline Levine, MD;  Location: [facility];  Service: Neurosurgery;  Laterality: N/A;   MICROLARYNGOSCOPY Left 05/12/2019   Procedure:  MICROLARYNGOSCOPY WITH BIOPSY;  Surgeon: Rozetta Nunnery, MD;  Location: [facility];  Service: ENT;  Laterality: Left;   NECK SURGERY  January 2005, March 2009   C-Spine   POLYPECTOMY     ROTATOR CUFF REPAIR  August 2000, January 2002, July 2008, July 2009   2 left 2 right   TOTAL HIP ARTHROPLASTY Left 09/21/2021   Procedure: TOTAL HIP ARTHROPLASTY ANTERIOR APPROACH;  Surgeon: Gaynelle Arabian, MD;  Location: [facility];  Service: Orthopedics;  Laterality: Left;    Family History  Problem Relation Age of Onset   Bone cancer Mother    Squamous cell carcinoma Mother  died @ 28   Heart attack Mother    Hypertension Father    Diabetes Father        borderline   Congestive Heart Failure Father        alive @ 36   Heart failure Father    Sudden death Brother        died @ 35   Other Brother        died in [place] @ 42 - struck by drunk driver   Other Brother        died in [place] @ 79 - struck by drunk driver   Heart attack Brother    Stroke Paternal Grandfather    Colon cancer Neg Hx    Esophageal cancer Neg Hx    Rectal cancer Neg Hx    Stomach cancer Neg Hx    Colon polyps Neg Hx     Allergies  Allergen Reactions   Lisinopril Swelling    Swollen Lip   Percodan [Oxycodone-Aspirin] Rash    Percodan caused rash---but he can tolerate Percocet and aspirin on their own     Current Outpatient Medications on File Prior to Visit  Medication Sig Dispense Refill   albuterol (VENTOLIN HFA) 108 (90 Base) MCG/ACT inhaler Inhale 1 puff into the lungs every 4 (four) hours as needed for wheezing or shortness of breath. 1 each 5   allopurinol (ZYLOPRIM) 100 MG tablet Take 1 tablet (100 mg total) by mouth daily. (Patient taking differently: Take 100 mg by mouth in the morning.) 90 tablet 3   amoxicillin-clavulanate (AUGMENTIN) 875-125 MG tablet Take 1 tablet by mouth 2 (two) times daily. 14 tablet 0   atorvastatin (LIPITOR) 80 MG tablet TAKE 1 TABLET BY MOUTH EVERY DAY 30  tablet 0   colchicine 0.6 MG tablet TAKE 1 TABLET (0.6 MG TOTAL) BY MOUTH DAILY AS NEEDED (GOUT). 90 tablet 1   cyclobenzaprine (FLEXERIL) 10 MG tablet Take 1 tablet (10 mg total) by mouth 3 (three) times daily. 30 tablet 0   diltiazem (CARDIZEM CD) 360 MG 24 hr capsule TAKE 1 CAPSULE BY MOUTH EVERY DAY 30 capsule 0   gabapentin (NEURONTIN) 300 MG capsule Take 300 mg by mouth in the morning, at noon, in the evening, and at bedtime.     losartan (COZAAR) 100 MG tablet TAKE 1 TABLET BY MOUTH EVERY DAY 90 tablet 3   omeprazole (PRILOSEC) 40 MG capsule TAKE 1 CAPSULE (40 MG TOTAL) BY MOUTH DAILY. 90 capsule 3   oxyCODONE (OXY IR/ROXICODONE) 5 MG immediate release tablet Take 1 tablet (5 mg total) by mouth every 6 (six) hours as needed for breakthrough pain (Breakthrough pain not responsive to chronic 10-325 Percocet Q4). 28 tablet 0   oxyCODONE-acetaminophen (PERCOCET) 10-325 MG tablet Take 1 tablet by mouth every 4 (four) hours as needed for pain.     predniSONE (DELTASONE) 20 MG tablet Take two tablets by mouth once daily for 6  days 12 tablet 0   sertraline (ZOLOFT) 100 MG tablet Take 1 tablet (100 mg total) by mouth in the morning. (Patient taking differently: Take 50 mg by mouth in the morning.) 90 tablet 1   sertraline (ZOLOFT) 50 MG tablet TAKE 1 TABLET BY MOUTH EVERY DAY 90 tablet 1   vitamin B-12 (CYANOCOBALAMIN) 1000 MCG tablet Take 1,000 mcg by mouth in the morning.     No current facility-administered medications on file prior to visit.    There were no vitals taken for this visit.  Objective:   Physical Exam Vitals and nursing note reviewed.  Constitutional:      General: He is not in acute distress.    Appearance: Normal appearance. He is well-developed and normal weight.  HENT:     Head: Normocephalic and atraumatic.     Right Ear: Tympanic membrane, ear canal and external ear normal. There is no impacted cerumen.     Left Ear: Tympanic membrane, ear canal and external  ear normal. There is no impacted cerumen.     Nose: Nose normal. No congestion or rhinorrhea.     Mouth/Throat:     Mouth: Mucous membranes are moist.     Pharynx: Oropharynx is clear. No oropharyngeal exudate or posterior oropharyngeal erythema.  Eyes:     General:        Right eye: No discharge.        Left eye: No discharge.     Extraocular Movements: Extraocular movements intact.     Conjunctiva/sclera: Conjunctivae normal.     Pupils: Pupils are equal, round, and reactive to light.  Neck:     Vascular: No carotid bruit.     Trachea: No tracheal deviation.  Cardiovascular:     Rate and Rhythm: Normal rate and regular rhythm.     Pulses: Normal pulses.     Heart sounds: Normal heart sounds. No murmur heard.    No friction rub. No gallop.  Pulmonary:     Effort: Pulmonary effort is normal. No respiratory distress.     Breath sounds: Normal breath sounds. No stridor. No wheezing, rhonchi or rales.  Chest:     Chest wall: No tenderness.  Abdominal:     General: Bowel sounds are normal. There is no distension.     Palpations: Abdomen is soft. There is no mass.     Tenderness: There is no abdominal tenderness. There is no right CVA tenderness, left CVA tenderness, guarding or rebound.     Hernia: No hernia is present.  Musculoskeletal:        General: No swelling, tenderness, deformity or signs of injury. Normal range of motion.     Right lower leg: No edema.     Left lower leg: No edema.  Lymphadenopathy:     Cervical: No cervical adenopathy.  Skin:    General: Skin is warm and dry.     Capillary Refill: Capillary refill takes less than 2 seconds.     Coloration: Skin is not jaundiced or pale.     Findings: No bruising, erythema, lesion or rash.  Neurological:     General: No focal deficit present.     Mental Status: He is alert and oriented to person, place, and time.     Cranial Nerves: No cranial nerve deficit.     Sensory: No sensory deficit.     Motor: No weakness.      Coordination: Coordination normal.     Gait: Gait normal.     Deep Tendon Reflexes: Reflexes normal.  Psychiatric:        Mood and Affect: Mood normal.        Behavior: Behavior normal.        Thought Content: Thought content normal.        Judgment: Judgment normal.       Assessment & Plan:  1. Routine general medical examination at a health care facility  - CBC with Differential/Platelet; Future - Comprehensive metabolic panel; Future - Hemoglobin A1c; Future - Lipid panel; Future - TSH; Future  2. Essential hypertension - Will add Norvac 5 mg - Follow up in 1 month - CBC with Differential/Platelet; Future - Comprehensive metabolic panel; Future - Hemoglobin A1c; Future - Lipid panel; Future - TSH; Future - diltiazem (CARDIZEM CD) 360 MG 24 hr capsule; Take 1 capsule (360 mg total) by mouth daily.  Dispense: 90 capsule; Refill: 3 - amLODipine (NORVASC) 5 MG tablet; Take 1 tablet (5 mg total) by mouth daily.  Dispense: 90 tablet; Refill: 0  3. Anxiety disorder, unspecified type - Continue with Zoloft and xanax  - CBC with Differential/Platelet; Future - Comprehensive metabolic panel; Future - Hemoglobin A1c; Future - Lipid panel; Future - TSH; Future - ALPRAZolam (XANAX) 0.5 MG tablet; Take 1 tablet (0.5 mg total) by mouth 3 (three) times daily.  Dispense: 90 tablet; Refill: 2  4. Hyperlipidemia, unspecified hyperlipidemia type  - CBC with Differential/Platelet; Future - Comprehensive metabolic panel; Future - Hemoglobin A1c; Future - Lipid panel; Future - TSH; Future - atorvastatin (LIPITOR) 80 MG tablet; Take 1 tablet (80 mg total) by mouth daily.  Dispense: 90 tablet; Refill: 3  5. Idiopathic gout, unspecified chronicity, unspecified site  - CBC with Differential/Platelet; Future - Comprehensive metabolic panel; Future - Hemoglobin A1c; Future - Lipid panel; Future - TSH; Future - allopurinol (ZYLOPRIM) 100 MG tablet; Take 1 tablet (100 mg total) by  mouth in the morning.  Dispense: 90 tablet; Refill: 3  6. Prostate cancer screening  - PSA; Future  7. Glottis carcinoma ([place]) - Follow up with ENT and Oncology as directed.  - CBC with Differential/Platelet; Future - Comprehensive metabolic panel; Future - Hemoglobin A1c; Future - Lipid panel; Future - TSH; Future   8. Erectile dysfunction, unspecified erectile dysfunction type  - sildenafil (VIAGRA) 50 MG tablet; Take 1 tablet (50 mg total) by mouth daily as needed for erectile dysfunction.  Dispense: 10 tablet; Refill: 2  Dorothyann Peng, NP

## 2022-02-05 ENCOUNTER — Emergency Department (HOSPITAL_COMMUNITY): Payer: 59

## 2022-02-05 ENCOUNTER — Other Ambulatory Visit: Payer: Self-pay

## 2022-02-05 ENCOUNTER — Encounter (HOSPITAL_COMMUNITY): Payer: Self-pay | Admitting: *Deleted

## 2022-02-05 ENCOUNTER — Emergency Department (HOSPITAL_COMMUNITY)
Admission: EM | Admit: 2022-02-05 | Discharge: 2022-02-05 | Disposition: A | Discharge status: Home / Self Care | Payer: 59 | Attending: Emergency Medicine | Admitting: Emergency Medicine

## 2022-02-05 DIAGNOSIS — I2510 Atherosclerotic heart disease of native coronary artery without angina pectoris: Secondary | ICD-10-CM | POA: Diagnosis not present

## 2022-02-05 DIAGNOSIS — Z79899 Other long term (current) drug therapy: Secondary | ICD-10-CM | POA: Diagnosis not present

## 2022-02-05 DIAGNOSIS — J432 Centrilobular emphysema: Secondary | ICD-10-CM | POA: Diagnosis not present

## 2022-02-05 DIAGNOSIS — J9811 Atelectasis: Secondary | ICD-10-CM | POA: Diagnosis not present

## 2022-02-05 DIAGNOSIS — R079 Chest pain, unspecified: Secondary | ICD-10-CM | POA: Diagnosis not present

## 2022-02-05 DIAGNOSIS — I10 Essential (primary) hypertension: Secondary | ICD-10-CM | POA: Diagnosis not present

## 2022-02-05 DIAGNOSIS — Q272 Other congenital malformations of renal artery: Secondary | ICD-10-CM | POA: Diagnosis not present

## 2022-02-05 DIAGNOSIS — M47814 Spondylosis without myelopathy or radiculopathy, thoracic region: Secondary | ICD-10-CM | POA: Diagnosis not present

## 2022-02-05 DIAGNOSIS — R0789 Other chest pain: Secondary | ICD-10-CM | POA: Diagnosis not present

## 2022-02-05 DIAGNOSIS — K5730 Diverticulosis of large intestine without perforation or abscess without bleeding: Secondary | ICD-10-CM | POA: Diagnosis not present

## 2022-02-05 DIAGNOSIS — M5136 Other intervertebral disc degeneration, lumbar region: Secondary | ICD-10-CM | POA: Diagnosis not present

## 2022-02-05 LAB — CBC
HCT: 36.5 % — ABNORMAL LOW (ref 39.0–52.0)
Hemoglobin: 12.1 g/dL — ABNORMAL LOW (ref 13.0–17.0)
MCH: 28.4 pg (ref 26.0–34.0)
MCHC: 33.2 g/dL (ref 30.0–36.0)
MCV: 85.7 fL (ref 80.0–100.0)
Platelets: 277 10*3/uL (ref 150–400)
RBC: 4.26 MIL/uL (ref 4.22–5.81)
RDW: 15.3 % (ref 11.5–15.5)
WBC: 8.5 10*3/uL (ref 4.0–10.5)
nRBC: 0 % (ref 0.0–0.2)

## 2022-02-05 LAB — COMPREHENSIVE METABOLIC PANEL
ALT: 18 U/L (ref 0–44)
AST: 20 U/L (ref 15–41)
Albumin: 3.9 g/dL (ref 3.5–5.0)
Alkaline Phosphatase: 88 U/L (ref 38–126)
Anion gap: 12 (ref 5–15)
BUN: 12 mg/dL (ref 8–23)
CO2: 25 mmol/L (ref 22–32)
Calcium: 9.5 mg/dL (ref 8.9–10.3)
Chloride: 95 mmol/L — ABNORMAL LOW (ref 98–111)
Creatinine, Ser: 0.95 mg/dL (ref 0.61–1.24)
GFR, Estimated: >60 mL/min (ref >60)
Glucose, Bld: 109 mg/dL — ABNORMAL HIGH (ref 70–99)
Potassium: 4.1 mmol/L (ref 3.5–5.1)
Sodium: 132 mmol/L — ABNORMAL LOW (ref 135–145)
Total Bilirubin: 0.5 mg/dL (ref 0.3–1.2)
Total Protein: 7.3 g/dL (ref 6.5–8.1)

## 2022-02-05 LAB — TROPONIN I (HIGH SENSITIVITY)
Troponin I (High Sensitivity): 5 ng/L (ref <18)
Troponin I (High Sensitivity): 3 ng/L (ref <18)

## 2022-02-05 MED ORDER — ONDANSETRON 4 MG PO TBDP
8.0000 mg | ORAL_TABLET | Freq: Once | ORAL | Status: AC
  Administered 2022-02-05: 8 mg via ORAL
  Filled 2022-02-05: qty 2

## 2022-02-05 MED ORDER — IOHEXOL 300 MG/ML  SOLN
80.0000 mL | Freq: Once | INTRAMUSCULAR | Status: AC | PRN
  Administered 2022-02-05: 80 mL via INTRAVENOUS

## 2022-02-05 MED ORDER — LORAZEPAM 2 MG/ML IJ SOLN
0.5000 mg | Freq: Once | INTRAMUSCULAR | Status: AC
  Administered 2022-02-05: 0.5 mg via INTRAVENOUS
  Filled 2022-02-05: qty 1

## 2022-02-05 NOTE — ED Provider Notes (Signed)
[facility] EMERGENCY DEPARTMENT Provider Note   CSN: [id number] Arrival date & time: 02/05/22  6606     History  Chief Complaint  Patient presents with   Chest Pain    Aaron Black is a 66 y.o. male.  HPI 66 year old male history of high blood pressure presents today complaining of chest and back pain that began yesterday.  He states he had some beginning of the pain in the left anterior chest with some radiation to his neck.  Then began going to his left shoulder and between his shoulder blades pain has been somewhat constant since yesterday.  He is unable to tell me when he last had no pain.  He denies any dyspnea, fever, chills, or diaphoresis.  He has not had any similar symptoms in the past.  He has a history of high blood pressure and has recently had a Norvasc added to his regimen.  He has not had his medications today.  Nuys any history of coronary artery disease.     Home Medications Prior to Admission medications   Medication Sig Start Date End Date Taking? Authorizing Provider  albuterol (VENTOLIN HFA) 108 (90 Base) MCG/ACT inhaler Inhale 1 puff into the lungs every 4 (four) hours as needed for wheezing or shortness of breath. 10/17/21   Burchette, Alinda Sierras, MD  allopurinol (ZYLOPRIM) 100 MG tablet Take 1 tablet (100 mg total) by mouth in the morning. 01/27/22 04/27/22  Nafziger, Tommi Rumps, NP  ALPRAZolam Duanne Moron) 0.5 MG tablet Take 1 tablet (0.5 mg total) by mouth 3 (three) times daily. 01/27/22 02/26/22  Nafziger, Tommi Rumps, NP  amLODipine (NORVASC) 5 MG tablet Take 1 tablet (5 mg total) by mouth daily. 01/27/22   Nafziger, Tommi Rumps, NP  atorvastatin (LIPITOR) 80 MG tablet Take 1 tablet (80 mg total) by mouth daily. 01/27/22   Nafziger, Tommi Rumps, NP  colchicine 0.6 MG tablet TAKE 1 TABLET (0.6 MG TOTAL) BY MOUTH DAILY AS NEEDED (GOUT). 03/20/18   Marletta Lor, MD  cyclobenzaprine (FLEXERIL) 10 MG tablet Take 1 tablet (10 mg total) by mouth 3 (three) times daily. 09/22/21    Edmisten, Ok Anis, PA  diltiazem (CARDIZEM CD) 360 MG 24 hr capsule Take 1 capsule (360 mg total) by mouth daily. 01/27/22 04/27/22  Nafziger, Tommi Rumps, NP  losartan (COZAAR) 100 MG tablet TAKE 1 TABLET BY MOUTH EVERY DAY 11/30/21   Nafziger, Tommi Rumps, NP  omeprazole (PRILOSEC) 40 MG capsule TAKE 1 CAPSULE (40 MG TOTAL) BY MOUTH DAILY. 11/29/21   Nafziger, Tommi Rumps, NP  sertraline (ZOLOFT) 50 MG tablet TAKE 1 TABLET BY MOUTH EVERY DAY 11/15/21   Nafziger, Tommi Rumps, NP  sildenafil (VIAGRA) 50 MG tablet Take 1 tablet (50 mg total) by mouth daily as needed for erectile dysfunction. 01/27/22   Nafziger, Tommi Rumps, NP  vitamin B-12 (CYANOCOBALAMIN) 1000 MCG tablet Take 1,000 mcg by mouth in the morning.    [provider]      Allergies    Lisinopril and Percodan [oxycodone-aspirin]    Review of Systems   Review of Systems  Physical Exam Updated Vital Signs BP (!) 164/106 (BP Location: Right Arm)   Pulse 88   Temp 97.7 F (36.5 C) (Oral)   Resp 16   SpO2 99%  Physical Exam Vitals and nursing note reviewed.  Constitutional:      Appearance: He is well-developed.  HENT:     Head: Normocephalic.  Eyes:     Pupils: Pupils are equal, round, and reactive to light.  Cardiovascular:  Rate and Rhythm: Normal rate and regular rhythm.     Heart sounds: Normal heart sounds.  Pulmonary:     Effort: Pulmonary effort is normal.     Breath sounds: Normal breath sounds.  Chest:     Chest wall: No mass, deformity, tenderness or crepitus.  Abdominal:     General: Bowel sounds are normal. There is no abdominal bruit.     Palpations: Abdomen is soft.  Musculoskeletal:        General: Normal range of motion.     Cervical back: Normal range of motion.  Skin:    General: Skin is warm and dry.     Capillary Refill: Capillary refill takes less than 2 seconds.  Neurological:     General: No focal deficit present.     Mental Status: He is alert.  Psychiatric:        Mood and Affect: Mood normal.     ED  Results / Procedures / Treatments   Labs (all labs ordered are listed, but only abnormal results are displayed) Labs Reviewed  CBC - Abnormal; Notable for the following components:      Result Value   Hemoglobin 12.1 (*)    HCT 36.5 (*)    All other components within normal limits  COMPREHENSIVE METABOLIC PANEL - Abnormal; Notable for the following components:   Sodium 132 (*)    Chloride 95 (*)    Glucose, Bld 109 (*)    All other components within normal limits  TROPONIN I (HIGH SENSITIVITY)  TROPONIN I (HIGH SENSITIVITY)    EKG EKG Interpretation  Date/Time:  Sunday February 05 2022 08:56:43 EDT Ventricular Rate:  92 PR Interval:  144 QRS Duration: 78 QT Interval:  360 QTC Calculation: 445 R Axis:   74 Text Interpretation: Normal sinus rhythm Normal ECG When compared with ECG of 08-Sep-2021 09:37, PREVIOUS ECG IS PRESENT SINCE LAST TRACING HEART RATE HAS INCREASED Confirmed by Pattricia Boss [phone]) on 02/05/2022 1:39:13 PM  Radiology CT Angio Chest/Abd/Pel for Dissection W and/or Wo Contrast  Result Date: 02/05/2022 CLINICAL DATA:  Chest pain or back pain, aortic dissection suspected. EXAM: CT ANGIOGRAPHY CHEST, ABDOMEN AND PELVIS TECHNIQUE: Non-contrast CT of the chest was initially obtained. Multidetector CT imaging through the chest, abdomen and pelvis was performed using the standard protocol during bolus administration of intravenous contrast. Multiplanar reconstructed images and MIPs were obtained and reviewed to evaluate the vascular anatomy. RADIATION DOSE REDUCTION: This exam was performed according to the departmental dose-optimization program which includes automated exposure control, adjustment of the mA and/or kV according to patient size and/or use of iterative reconstruction technique. CONTRAST:  13m OMNIPAQUE IOHEXOL 300 MG/ML  SOLN COMPARISON:  12/22/2021 screening chest CT. 07/15/2018 CT abdomen/pelvis. FINDINGS: CTA CHEST FINDINGS Cardiovascular: Normal heart size.  No significant pericardial effusion/thickening. Left anterior descending coronary atherosclerosis. Atherosclerotic nonaneurysmal thoracic aorta. No acute intramural hematoma, dissection, pseudoaneurysm or penetrating atherosclerotic ulcer in the thoracic aorta. Aortic arch branch vessels are patent. Normal caliber pulmonary arteries. No central pulmonary emboli. Mediastinum/Nodes: No discrete thyroid nodules. Unremarkable esophagus. No pathologically enlarged axillary, mediastinal or hilar lymph nodes. Lungs/Pleura: No pneumothorax. No pleural effusion. Mild-to-moderate paraseptal and centrilobular emphysema. No acute consolidative airspace disease or lung masses. Scattered tiny solid pulmonary nodules, largest 0.3 cm in the right upper lobe (series 8/image 60), unchanged from prior screening chest CT studies. No new significant pulmonary nodules. Musculoskeletal: No aggressive appearing focal osseous lesions. Partially visualized bilateral anterior and posterior cervical spinal fusion hardware. Moderate  lower thoracic spondylosis. Review of the MIP images confirms the above findings. CTA ABDOMEN AND PELVIS FINDINGS VASCULAR Aorta: Atherosclerotic nonaneurysmal abdominal aorta with no dissection or significant stenosis. Celiac: Patent without evidence of aneurysm, dissection, vasculitis or significant stenosis. SMA: Patent without evidence of aneurysm, dissection, vasculitis or significant stenosis. Renals: Bilateral renal arteries are patent without evidence of aneurysm, dissection, vasculitis, fibromuscular dysplasia or significant stenosis. Tiny superior accessory left renal artery. Two similar sized right renal arteries. IMA: Patent without evidence of aneurysm, dissection, vasculitis or significant stenosis. Inflow: No aneurysm or dissection. At least moderate stenoses of the bilateral common iliac arteries (poorly evaluated on the left due to extensive streak artifact from spinal hardware). Veins: No obvious  venous abnormality within the limitations of this arterial phase study. Review of the MIP images confirms the above findings. NON-VASCULAR Hepatobiliary: Normal liver with no liver mass. Normal gallbladder with no radiopaque cholelithiasis. No biliary ductal dilatation. Pancreas: Normal, with no mass or duct dilation. Spleen: Normal size. No mass. Adrenals/Urinary Tract: Normal adrenals. Symmetric normal contrast nephrograms. No contour deforming renal masses. No hydronephrosis. Obscured by streak artifact with no gross bladder abnormality. Stomach/Bowel: Normal non-distended stomach. Normal caliber small bowel with no small bowel wall thickening. Normal appendix. Mild left colonic diverticulosis with no large bowel wall thickening or significant pericolonic fat stranding. Vascular/Lymphatic: No pathologically enlarged lymph nodes in the abdomen or pelvis. Reproductive: Normal size prostate. Other: No pneumoperitoneum, ascites or focal fluid collection. Musculoskeletal: No aggressive appearing focal osseous lesions. Left total hip arthroplasty. Bilateral posterior spinal fusion L1-L5. Anterior spinal fusion at L4-5. Marked multilevel lumbar degenerative disc disease. Review of the MIP images confirms the above findings. IMPRESSION: 1. No acute aortic syndrome. No acute abnormality in the chest, abdomen or pelvis. 2. One vessel coronary atherosclerosis. 3. Mild left colonic diverticulosis. 4. Aortic Atherosclerosis (ICD10-I70.0) and Emphysema (ICD10-J43.9). Electronically Signed   By: Ilona Sorrel M.D.   On: 02/05/2022 15:30   DG Chest 2 View  Result Date: 02/05/2022 CLINICAL DATA:  Chest pain EXAM: CHEST - 2 VIEW COMPARISON:  12/22/2021 FINDINGS: The heart size and mediastinal contours are within normal limits. Mild right basilar atelectasis. Lungs are otherwise clear. No pleural effusion or pneumothorax. The visualized skeletal structures are unremarkable. IMPRESSION: Mild right basilar atelectasis. Otherwise  no acute cardiopulmonary findings. Electronically Signed   By: Davina Poke D.O.   On: 02/05/2022 10:01    Procedures Procedures    Medications Ordered in ED Medications  ondansetron (ZOFRAN-ODT) disintegrating tablet 8 mg (8 mg Oral Given 02/05/22 1435)  LORazepam (ATIVAN) injection 0.5 mg (0.5 mg Intravenous Given 02/05/22 1436)  iohexol (OMNIPAQUE) 300 MG/ML solution 80 mL (80 mLs Intravenous Contrast Given 02/05/22 1511)    ED Course/ Medical Decision Making/ A&P Clinical Course as of 02/05/22 1545  Sun Feb 05, 2022  1339 CBC reviewed interpreted hemoglobin is normal at 12 [DR]  5852 Metabolic panel reviewed interpreted mild hyponatremia with sodium 132 and chloride of 95 otherwise electrolytes and renal function liver function within normal limits Sodium compared to prior and unchanged [DR]  1340 Troponin I (High Sensitivity) Troponin reviewed interpreted and normal at 3 [DR]    Clinical Course User Index [DR] Pattricia Boss, MD                           Medical Decision Making 66 year old male presents today complaining of chest pain that began last night.  It radiated to his neck and  then into his shoulder and then between his shoulder blades.  Pain is moderate.  Is worse with certain movements.  It is not reproducible on my exam. Differential diagnosis includes ACS and MI, dissection of the great vessels, other intrathoracic etiologies including esophagitis, skill skeletal etiologies, infectious etiologies, per GI etiologies Patient evaluated here with labs, EKG, x-Rosmary Dionisio.  No evidence of cardiac ischemia on EKG or elevated troponin.  Single troponin was done as patient has had pain for over 16 hours Due to patient's migrating pain and pain going between shoulder blades, CT angio was done.  No evidence dissection or other acute abnormalities noted on CTA.  Low-grade was vessel coronary atherosclerosis noted. Patient advised regarding need for follow-up and return precautions and  voices understanding.  Amount and/or Complexity of Data Reviewed Labs: ordered. Decision-making details documented in ED Course. Radiology: ordered and independent interpretation performed. Decision-making details documented in ED Course.  Risk Prescription drug management. Decision regarding hospitalization.           Final Clinical Impression(s) / ED Diagnoses Final diagnoses:  Nonspecific chest pain    Rx / DC Orders ED Discharge Orders     None         Pattricia Boss, MD 02/05/22 1545

## 2022-02-05 NOTE — ED Triage Notes (Signed)
Arrived by EMS, pt is here with 1 day of chest pain to mid left chest that occasionally radiates down left arm.  Pt reports a couple of days of dark tarry stools and is not on thinners. Pt last hgb on 7/7 was 10 per EMS.  Pt is alert and not having pain currently. Pt has pain with movement of left arm.

## 2022-02-05 NOTE — Discharge Instructions (Signed)
You were evaluated here with EKG, labs, heart enzymes, and CT scan No evidence of seeing of heart attack or acute abnormalities in your chest on your CAT scan. Please call your doctor for recheck tomorrow Return if you are having new or worsening symptoms for reevaluation.

## 2022-02-05 NOTE — ED Provider Triage Note (Signed)
Emergency Medicine Provider Triage Evaluation Note  Aaron Black , a 66 y.o. male  was evaluated in triage.  Pt complains of chest pain.  Patient states symptoms began 1 to 2 days ago.  Pain is described as sharp, left-sided and feels like an ice pick.  Pain is intermittent in nature.  He has associated shortness of breath.  Pain is said to radiate down left arm.  He also has a history of rheumatoid arthritis and states that he has had more arthritic pain after weaning off his Vicodin over the past month or 2.  He has some pain with movement of his left arm as well.  Patient reported to EMS that he had black tarry stools.  His last hemoglobin was 12.6 on 7/7.  He is currently not having hematochezia or melena.  Review of Systems  Positive: See above Negative:   Physical Exam  BP (!) 134/98 (BP Location: Right Arm)   Pulse 94   Temp 98.4 F (36.9 C) (Oral)   Resp 18   SpO2 99%  Gen:   Awake, no distress   Resp:  Normal effort  MSK:   Moves extremities without difficulty.  Pain with movement of left arm. Other:  No wheeze, rales, rhonchi upon auscultation.  No tenderness to palpation on chest wall.  Medical Decision Making  Medically screening exam initiated at 9:16 AM.  Appropriate orders placed.  JAWANN URBANI was informed that the remainder of the evaluation will be completed by another provider, this initial triage assessment does not replace that evaluation, and the importance of remaining in the ED until their evaluation is complete.     Wilnette Kales, [place] 02/05/22 [phone]

## 2022-02-08 ENCOUNTER — Encounter: Payer: Self-pay | Admitting: Adult Health

## 2022-02-08 ENCOUNTER — Ambulatory Visit (INDEPENDENT_AMBULATORY_CARE_PROVIDER_SITE_OTHER): Payer: 59 | Admitting: Adult Health

## 2022-02-08 VITALS — BP 120/64 | HR 83 | Temp 98.4°F | Ht 68.0 in | Wt 172.0 lb

## 2022-02-08 DIAGNOSIS — R0789 Other chest pain: Secondary | ICD-10-CM

## 2022-02-08 NOTE — Progress Notes (Signed)
Subjective:    Patient ID: Aaron Black, male    DOB: Mar 16, 1956, 66 y.o.   MRN: [id number]  HPI 66 year old male who  has a past medical history of Anemia, Anxiety, Arm pain, Arthritis, B12 deficiency, Back pain, Cancer (HCC), Cellulitis of arm, left (10/2017), Cervical disc disease, Chest pain at rest (02/25/2013), Chronic lower back pain, Complication of anesthesia, Depression, Diverticulosis of colon, GERD (gastroesophageal reflux disease), Gout, Hyperlipidemia, Hypertension, Joint pain, Leg pain, Leg weakness, Neck pain, Pleurisy, Shortness of breath dyspnea, and Tobacco abuse.  He presents to the office today for follow-up for being seen in the emergency room 3 days ago complaining of chest and back pain that began the day prior.  He stated that he has beginning of the pain in the left anterior chest with some radiation to his neck.  Pain then began going to his left shoulder and between his left shoulder blades.  He denied any dyspnea, fever, chills, diaphoresis.  Denied similar symptoms in the past.  In the ER he was evaluated with labs, EKG, and x-ray.  There is no evidence of cardiac ischemia on EKG or elevated troponin.  Due to the patient's migrating pain and pain going between shoulder blades, CT angio was done.  There was no evidence of dissection or other acute abnormalities on CTA.  Today he reports that he is feeling back to baseline.  No longer having any of the other pain that he was experiencing.  He denies any trauma or aggravating factor which could have caused his pain.  He does have significant cervical spine surgeries in the past.  Review of Systems See HPI   Past Medical History:  Diagnosis Date   Anemia    Anxiety    Arm pain    Arthritis    B12 deficiency    Back pain    Cancer ([place])    throat cancer    Cellulitis of arm, left 10/2017   after cat scratch   Cervical disc disease    a. 07/2003 ant cervical deomcpression and fusion C5-6/C6-7;  b. 09/2007 post  cervical laminectomy C4-5 with scres and arthrodesis C4-C7.   Chest pain at rest 02/25/2013   Chronic lower back pain    Complication of anesthesia    " i WOKE UP DURING A COLONOSCOPY "- 2008   Depression    Diverticulosis of colon    GERD (gastroesophageal reflux disease)    Gout    Hyperlipidemia    Hypertension    Joint pain    Leg pain    While walking   Leg weakness    Neck pain    Pleurisy    Shortness of breath dyspnea    with exertion- "I cant exercise now"   Tobacco abuse    a. 40 yrs, 1.5-3 ppd over that time.    Social History   Socioeconomic History   Marital status: Married    Spouse name: Not on file   Number of children: Not on file   Years of education: Not on file   Highest education level: Not on file  Occupational History   Not on file  Tobacco Use   Smoking status: Former    Packs/day: 1.00    Years: 42.00    Total pack years: 42.00    Types: Cigarettes    Start date: 63    Quit date: 06/08/2019    Years since quitting: 2.6   Smokeless tobacco: Never   Tobacco  comments:    He is smoking about 4-5 cigarettes, He had smoked up to 3 packs daily. 06/03/19  Vaping Use   Vaping Use: Never used  Substance and Sexual Activity   Alcohol use: Yes    Comment: occasional   Drug use: No   Sexual activity: Not on file  Other Topics Concern   Not on file  Social History Narrative   Lives in [place] with wife.  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   Social Determinants of Health   Financial Resource Strain: Not on file  Food Insecurity: Not on file  Transportation Needs: No Transportation Needs (06/03/2019)   PRAPARE - Hydrologist (Medical): No    Lack of Transportation (Non-Medical): No  Physical Activity: Not on file  Stress: Not on file  Social Connections: Not on file  Intimate Partner Violence: Not At Risk (06/03/2019)   Humiliation, Afraid, Rape, and Kick questionnaire    Fear of  Current or Ex-Partner: No    Emotionally Abused: No    Physically Abused: No    Sexually Abused: No    Past Surgical History:  Procedure Laterality Date   ABDOMINAL EXPOSURE N/A 01/03/2021   Procedure: ABDOMINAL EXPOSURE;  Surgeon: [name], MD;  Location: [facility];  Service: Vascular;  Laterality: N/A;   ANTERIOR LAT LUMBAR FUSION Left 01/04/2021   Procedure: Removal Of Broken Hardware;  Surgeon: Erline Levine, MD;  Location: [facility];  Service: Neurosurgery;  Laterality: Left;   ANTERIOR LATERAL LUMBAR FUSION 4 LEVELS Right 06/05/2014   Procedure: ANTERIOR LATERAL LUMBAR FUSION  LUMBAR ONE TO LUMBAR FIVE with Percutaneous Pedicle Screws;  Surgeon: Erline Levine, MD;  Location: [facility];  Service: Neurosurgery;  Laterality: Right;   ANTERIOR LUMBAR FUSION N/A 01/03/2021   Procedure: Lumbar Four-Five Anterior lumbar interbody fusion with revision of graft;  Surgeon: Erline Levine, MD;  Location: [facility];  Service: Neurosurgery;  Laterality: N/A;  Lumbar Four-Five Anterior lumbar interbody fusion with revision of graft   CARPAL TUNNEL RELEASE Right    CERVICAL DISC SURGERY     X 2   COLONOSCOPY     ELBOW ARTHROSCOPY Right    KNEE ARTHROSCOPY Right 08/2013   torn menicus   KNEE ARTHROSCOPY Left 11/2013   chip [place],torn menicus   LUMBAR LAMINECTOMY/DECOMPRESSION MICRODISCECTOMY Right 07/11/2013   Procedure: LUMBAR LAMINECTOMY/DECOMPRESSION MICRODISCECTOMY 1 LEVEL;  Surgeon: Erline Levine, MD;  Location: [facility];  Service: Neurosurgery;  Laterality: Right;  Right L34 microdiskectomy   LUMBAR PERCUTANEOUS PEDICLE SCREW 4 LEVEL N/A 06/05/2014   Procedure: LUMBAR PERCUTANEOUS PEDICLE SCREW 4 LEVEL;  Surgeon: Erline Levine, MD;  Location: [facility];  Service: Neurosurgery;  Laterality: N/A;   MICROLARYNGOSCOPY Left 05/12/2019   Procedure: MICROLARYNGOSCOPY WITH BIOPSY;  Surgeon: Rozetta Nunnery, MD;  Location: [facility];  Service: ENT;  Laterality: Left;    NECK SURGERY  January 2005, March 2009   C-Spine   POLYPECTOMY     ROTATOR CUFF REPAIR  August 2000, January 2002, July 2008, July 2009   2 left 2 right   TOTAL HIP ARTHROPLASTY Left 09/21/2021   Procedure: TOTAL HIP ARTHROPLASTY ANTERIOR APPROACH;  Surgeon: Gaynelle Arabian, MD;  Location: [facility];  Service: Orthopedics;  Laterality: Left;    Family History  Problem Relation Age of Onset   Bone cancer Mother    Squamous cell carcinoma Mother  died @ 22   Heart attack Mother    Hypertension Father    Diabetes Father        borderline   Congestive Heart Failure Father        alive @ 63   Heart failure Father    Sudden death Brother        died @ 2   Other Brother        died in [place] @ 73 - struck by drunk driver   Other Brother        died in [place] @ 33 - struck by drunk driver   Heart attack Brother    Stroke Paternal Grandfather    Colon cancer Neg Hx    Esophageal cancer Neg Hx    Rectal cancer Neg Hx    Stomach cancer Neg Hx    Colon polyps Neg Hx     Allergies  Allergen Reactions   Lisinopril Swelling    Swollen Lip   Percodan [Oxycodone-Aspirin] Rash    Percodan caused rash---but he can tolerate Percocet and aspirin on their own     Current Outpatient Medications on File Prior to Visit  Medication Sig Dispense Refill   albuterol (VENTOLIN HFA) 108 (90 Base) MCG/ACT inhaler Inhale 1 puff into the lungs every 4 (four) hours as needed for wheezing or shortness of breath. 1 each 5   allopurinol (ZYLOPRIM) 100 MG tablet Take 1 tablet (100 mg total) by mouth in the morning. 90 tablet 3   ALPRAZolam (XANAX) 0.5 MG tablet Take 1 tablet (0.5 mg total) by mouth 3 (three) times daily. 90 tablet 2   amLODipine (NORVASC) 5 MG tablet Take 1 tablet (5 mg total) by mouth daily. 90 tablet 0   atorvastatin (LIPITOR) 80 MG tablet Take 1 tablet (80 mg total) by mouth daily. 90 tablet 3   colchicine 0.6 MG tablet TAKE 1 TABLET (0.6 MG TOTAL) BY MOUTH DAILY AS NEEDED (GOUT). 90  tablet 1   cyclobenzaprine (FLEXERIL) 10 MG tablet Take 1 tablet (10 mg total) by mouth 3 (three) times daily. 30 tablet 0   diltiazem (CARDIZEM CD) 360 MG 24 hr capsule Take 1 capsule (360 mg total) by mouth daily. 90 capsule 3   losartan (COZAAR) 100 MG tablet TAKE 1 TABLET BY MOUTH EVERY DAY 90 tablet 3   omeprazole (PRILOSEC) 40 MG capsule TAKE 1 CAPSULE (40 MG TOTAL) BY MOUTH DAILY. 90 capsule 3   oxyCODONE-acetaminophen (PERCOCET) 10-325 MG tablet Take 1 tablet by mouth every 6 (six) hours as needed.     sertraline (ZOLOFT) 50 MG tablet TAKE 1 TABLET BY MOUTH EVERY DAY 90 tablet 1   sildenafil (VIAGRA) 50 MG tablet Take 1 tablet (50 mg total) by mouth daily as needed for erectile dysfunction. 10 tablet 2   vitamin B-12 (CYANOCOBALAMIN) 1000 MCG tablet Take 1,000 mcg by mouth in the morning.     amoxicillin (AMOXIL) 500 MG tablet TAKE 1 TABLET BY MOUTH 3 TIMES A DAY UNTIL GONE (Patient not taking: Reported on 02/08/2022)     gabapentin (NEURONTIN) 300 MG capsule gabapentin 300 mg capsule   300 mg 4 times a day by oral route. (Patient not taking: Reported on 02/08/2022)     No current facility-administered medications on file prior to visit.    BP 120/64   Pulse 83   Temp 98.4 F (36.9 C) (Oral)   Ht '5\' 8"'$  (1.727 m)   Wt 172 lb (78 kg)   SpO2 97%  BMI 26.15 kg/m       Objective:   Physical Exam Vitals and nursing note reviewed.  Constitutional:      Appearance: Normal appearance.  Cardiovascular:     Rate and Rhythm: Normal rate and regular rhythm.     Pulses: Normal pulses.     Heart sounds: Normal heart sounds.  Pulmonary:     Effort: Pulmonary effort is normal.     Breath sounds: Normal breath sounds.  Musculoskeletal:        General: No tenderness. Normal range of motion.  Skin:    General: Skin is warm and dry.  Neurological:     General: No focal deficit present.     Mental Status: He is alert and oriented to person, place, and time.  Psychiatric:        Mood  and Affect: Mood normal.        Behavior: Behavior normal.        Thought Content: Thought content normal.        Judgment: Judgment normal.       Assessment & Plan:  1. Atypical chest pain -My guess this was either soft tissue discomfort or pain radiating from his cervical spine from possible nerve impingement.  He can follow-up as needed.  Dorothyann Peng, NP

## 2022-03-01 ENCOUNTER — Ambulatory Visit: Payer: 59 | Admitting: Adult Health

## 2022-04-03 ENCOUNTER — Telehealth: Payer: Self-pay | Admitting: *Deleted

## 2022-04-03 NOTE — Telephone Encounter (Signed)
CALLED PATIENT TO ALTER FU ON 04-14-22 DUE TO DR. SQUIRE BEING OFF, SPOKE WITH PATIENT AND HE AGREED TO COME ON 04-21-22 @ 11:20 AM

## 2022-04-04 DIAGNOSIS — H34212 Partial retinal artery occlusion, left eye: Secondary | ICD-10-CM | POA: Diagnosis not present

## 2022-04-04 DIAGNOSIS — H25813 Combined forms of age-related cataract, bilateral: Secondary | ICD-10-CM | POA: Diagnosis not present

## 2022-04-04 DIAGNOSIS — H52203 Unspecified astigmatism, bilateral: Secondary | ICD-10-CM | POA: Diagnosis not present

## 2022-04-10 DIAGNOSIS — Z923 Personal history of irradiation: Secondary | ICD-10-CM | POA: Diagnosis not present

## 2022-04-10 DIAGNOSIS — Z8521 Personal history of malignant neoplasm of larynx: Secondary | ICD-10-CM | POA: Diagnosis not present

## 2022-04-10 DIAGNOSIS — R490 Dysphonia: Secondary | ICD-10-CM | POA: Diagnosis not present

## 2022-04-14 ENCOUNTER — Ambulatory Visit: Payer: 59 | Admitting: Radiation Oncology

## 2022-04-17 ENCOUNTER — Other Ambulatory Visit (HOSPITAL_COMMUNITY): Payer: Self-pay | Admitting: Optometry

## 2022-04-17 DIAGNOSIS — H34212 Partial retinal artery occlusion, left eye: Secondary | ICD-10-CM

## 2022-04-18 ENCOUNTER — Ambulatory Visit (HOSPITAL_COMMUNITY)
Admission: RE | Admit: 2022-04-18 | Discharge: 2022-04-18 | Disposition: A | Discharge status: Home / Self Care | Payer: 59 | Source: Ambulatory Visit | Attending: Cardiovascular Disease | Admitting: Cardiovascular Disease

## 2022-04-18 DIAGNOSIS — H34212 Partial retinal artery occlusion, left eye: Secondary | ICD-10-CM

## 2022-04-19 NOTE — Progress Notes (Addendum)
Aaron Black presents today for follow-up of radiation to his larynx completed on 07/17/2019  Pain issues, if any: None Using a feeding tube?: N/A Weight changes, if any: None  Swallowing issues, if any: None Smoking or chewing tobacco? None Using fluoride trays daily? No N/A--denies any dental concerns Last ENT visit was on: 04/10/2022 Saw Dr. Ebbie Latus:  --Procedures:  Flexible Laryngoscopy shows patent anterior nasal cavity with minimal crusting, no discharge or infection.  Normal base of tongue and supraglottis Normal vocal cord mobility without vocal cord nodule, mass, polyp or tumor. Hypopharynx normal without mass, pooling of secretions or aspiration. Complete head and neck examination today including flexible nasolaryngoscopy does not reveal any evidence of recurrent neoplasm.  Patient denies symptoms of dysphagia, odynophagia. Endorses good appetite. Continues to have hoarseness, which has been stable.  I offered referral to speech-language pathology for consideration of voice therapy, however patient states that he is not interested.  He is scheduled to see Dr. Isidore Moos next week. I also recommended obtaining an audiogram at his convenience to establish a baseline, as he does report hearing loss.  Patient to follow-up with me in 6 months for routine cancer surveillance, we will plan on obtaining an audiogram prior to that visit  Other notable issues, if any: None  BP (!) 156/98 (BP Location: Left Arm, Patient Position: Sitting, Cuff Size: Normal)   Pulse 86   Temp 98.5 F (36.9 C) (Oral)   Resp 19   Ht '5\' 8"'$  (1.727 m)   Wt 174 lb (78.9 kg)   SpO2 99%   BMI 26.46 kg/m

## 2022-04-21 ENCOUNTER — Encounter: Payer: Self-pay | Admitting: Radiation Oncology

## 2022-04-21 ENCOUNTER — Other Ambulatory Visit: Payer: Self-pay

## 2022-04-21 ENCOUNTER — Other Ambulatory Visit: Payer: Self-pay | Admitting: Adult Health

## 2022-04-21 ENCOUNTER — Ambulatory Visit
Admission: RE | Admit: 2022-04-21 | Discharge: 2022-04-21 | Disposition: A | Discharge status: Home / Self Care | Payer: 59 | Source: Ambulatory Visit | Attending: Radiation Oncology | Admitting: Radiation Oncology

## 2022-04-21 VITALS — BP 156/98 | HR 86 | Temp 98.5°F | Resp 19 | Ht 68.0 in | Wt 174.0 lb

## 2022-04-21 DIAGNOSIS — Z79899 Other long term (current) drug therapy: Secondary | ICD-10-CM | POA: Diagnosis not present

## 2022-04-21 DIAGNOSIS — I10 Essential (primary) hypertension: Secondary | ICD-10-CM

## 2022-04-21 DIAGNOSIS — Z8521 Personal history of malignant neoplasm of larynx: Secondary | ICD-10-CM | POA: Diagnosis not present

## 2022-04-21 DIAGNOSIS — Z923 Personal history of irradiation: Secondary | ICD-10-CM | POA: Insufficient documentation

## 2022-04-21 DIAGNOSIS — C140 Malignant neoplasm of pharynx, unspecified: Secondary | ICD-10-CM

## 2022-04-21 DIAGNOSIS — C320 Malignant neoplasm of glottis: Secondary | ICD-10-CM | POA: Diagnosis not present

## 2022-04-21 NOTE — Progress Notes (Signed)
Radiation Oncology         [phone] ________________________________  Name: Aaron Black MRN: [id number]  Date: 04/21/2022  DOB: 1956/05/08  Follow-Up Visit Note - Outpatient  CC: Aaron Peng, NP  Aaron Black, *  Diagnosis and Prior Radiotherapy:       ICD-10-CM   1. 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       Cancer Staging  Glottis carcinoma ([place]) Staging form: Larynx - Glottis, AJCC 8th Edition - Clinical stage from 06/03/2019: Stage I (cT1b, cN0, cM0) - Signed by Aaron Gibson, MD on 06/03/2019 Stage prefix: Initial diagnosis    Radiation Treatment Dates: 06/09/2019 through 07/17/2019  Site Technique Total Dose (Gy) Dose per Fx (Gy) Completed Fx Beam Energies  Larynx: HN_larynx 3D 63/63 2.25 28/28 6X   CHIEF COMPLAINT:  Here for follow-up and surveillance of throat cancer  Narrative:      Aaron Black presents today for follow-up of radiation to his larynx completed on 07/17/2019  Pain issues, if any: None Using a feeding tube?: N/A Weight changes, if any: None  Swallowing issues, if any: None Smoking or chewing tobacco? None Using fluoride trays daily? No N/A--denies any dental concerns Last ENT visit was on: 04/10/2022 Saw Dr. Ebbie Black:  --Procedures:  Flexible Laryngoscopy shows patent anterior nasal cavity with minimal crusting, no discharge or infection.  Normal base of tongue and supraglottis Normal vocal cord mobility without vocal cord nodule, mass, polyp or tumor. Hypopharynx normal without mass, pooling of secretions or aspiration. Complete head and neck examination today including flexible nasolaryngoscopy does not reveal any evidence of recurrent neoplasm.  Patient denies symptoms of dysphagia, odynophagia. Endorses good appetite. Continues to have hoarseness, which has been stable.  I offered referral to speech-language pathology for consideration of voice therapy, however patient  states that he is not interested.  He is scheduled to see Dr. Isidore Black next week. I also recommended obtaining an audiogram at his convenience to establish a baseline, as he does report hearing loss.  Patient to follow-up with me in 6 months for routine cancer surveillance, we will plan on obtaining an audiogram prior to that visit  Other notable issues, if any: None; he has successful hip surgery recently and is doing well  BP (!) 156/98 (BP Location: Left Arm, Patient Position: Sitting, Cuff Size: Normal)   Pulse 86   Temp 98.5 F (36.9 C) (Oral)   Resp 19   Ht '5\' 8"'$  (1.727 m)   Wt 174 lb (78.9 kg)   SpO2 99%   BMI 26.46 kg/m         ALLERGIES:  is allergic to lisinopril and percodan [oxycodone-aspirin].  Meds: Current Outpatient Medications  Medication Sig Dispense Refill   ALPRAZolam (XANAX) 0.5 MG tablet Take 0.5 mg by mouth at bedtime as needed for anxiety.     albuterol (VENTOLIN HFA) 108 (90 Base) MCG/ACT inhaler Inhale 1 puff into the lungs every 4 (four) hours as needed for wheezing or shortness of breath. 1 each 5   allopurinol (ZYLOPRIM) 100 MG tablet Take 1 tablet (100 mg total) by mouth in the morning. 90 tablet 3   amLODipine (NORVASC) 5 MG tablet Take 1 tablet (5 mg total) by mouth daily. 90 tablet 0   amoxicillin (AMOXIL) 500 MG tablet TAKE 1 TABLET BY MOUTH 3 TIMES A DAY UNTIL GONE (Patient not taking: Reported on 02/08/2022)     atorvastatin (LIPITOR) 80  MG tablet Take 1 tablet (80 mg total) by mouth daily. 90 tablet 3   colchicine 0.6 MG tablet TAKE 1 TABLET (0.6 MG TOTAL) BY MOUTH DAILY AS NEEDED (GOUT). 90 tablet 1   cyclobenzaprine (FLEXERIL) 10 MG tablet Take 1 tablet (10 mg total) by mouth 3 (three) times daily. 30 tablet 0   diltiazem (CARDIZEM CD) 360 MG 24 hr capsule Take 1 capsule (360 mg total) by mouth daily. 90 capsule 3   gabapentin (NEURONTIN) 300 MG capsule gabapentin 300 mg capsule   300 mg 4 times a day by oral route. (Patient not taking: Reported  on 02/08/2022)     losartan (COZAAR) 100 MG tablet TAKE 1 TABLET BY MOUTH EVERY DAY 90 tablet 3   omeprazole (PRILOSEC) 40 MG capsule TAKE 1 CAPSULE (40 MG TOTAL) BY MOUTH DAILY. 90 capsule 3   oxyCODONE-acetaminophen (PERCOCET) 10-325 MG tablet Take 1 tablet by mouth every 6 (six) hours as needed. (Patient not taking: Reported on 04/21/2022)     sertraline (ZOLOFT) 50 MG tablet TAKE 1 TABLET BY MOUTH EVERY DAY 90 tablet 1   sildenafil (VIAGRA) 50 MG tablet Take 1 tablet (50 mg total) by mouth daily as needed for erectile dysfunction. (Patient not taking: Reported on 04/21/2022) 10 tablet 2   vitamin B-12 (CYANOCOBALAMIN) 1000 MCG tablet Take 1,000 mcg by mouth in the morning.     No current facility-administered medications for this encounter.    Physical Findings: The patient is in no acute distress. Patient is alert and oriented. Wt Readings from Last 3 Encounters:  04/21/22 174 lb (78.9 kg)  02/08/22 172 lb (78 kg)  01/27/22 168 lb (76.2 kg)    height is '5\' 8"'$  (1.727 m) and weight is 174 lb (78.9 kg). His oral temperature is 98.5 F (36.9 C). His blood pressure is 156/98 (abnormal) and his pulse is 86. His respiration is 19 and oxygen saturation is 99%. .  General: Alert and oriented, in no acute distress HEENT: Head is normocephalic. Extraocular movements are intact. Oropharynx is clear. Neck: Neck is supple, no palpable cervical or supraclavicular lymphadenopathy. Extremities: No cyanosis or edema. Lymphatics: see Neck Exam Heart regular rate and rhythm Chest clear to auscultation bilaterally Psychiatric: Judgment and insight are intact. Affect is appropriate. MSK: Ambulatory independently  Lab Findings: Lab Results  Component Value Date   WBC 8.5 02/05/2022   HGB 12.1 (L) 02/05/2022   HCT 36.5 (L) 02/05/2022   MCV 85.7 02/05/2022   PLT 277 02/05/2022    Lab Results  Component Value Date   TSH 1.06 01/27/2022    Radiographic Findings: VAS US CAROTID  Result Date:  04/18/2022 Carotid Arterial Duplex Study Patient Name:  Aaron Black  Date of Exam:   04/18/2022 Medical Rec #: [id number]        Accession #:    [id number] Date of Birth: 08/03/1955         Patient Gender: M Patient Age:   43 years Exam Location:  [facility] Procedure:      VAS US CAROTID Referring Phys: Corene Cornea GOULD --------------------------------------------------------------------------------  Indications:  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               He denies any cerebrovascular symptoms at this time. Risk Factors: Hypertension, hyperlipidemia, past history of smoking. Performing Technologist: Mariane Masters RVT  Examination Guidelines: A complete evaluation includes B-mode imaging, spectral Doppler, color Doppler, and power Doppler as needed of all accessible portions  of each vessel. Bilateral testing is considered an integral part of a complete examination. Limited examinations for reoccurring indications may be performed as noted.  Right Carotid Findings: +----------+--------+--------+---------------------+------------------+--------+           PSV cm/sEDV cm/sStenosis             Plaque DescriptionComments +----------+--------+--------+---------------------+------------------+--------+ CCA Prox  90      13      No plaque                                       +----------+--------+--------+---------------------+------------------+--------+ CCA Mid                   No plaque                                       +----------+--------+--------+---------------------+------------------+--------+ CCA Distal82      20      No plaque visualized.                           +----------+--------+--------+---------------------+------------------+--------+ ICA Prox  85      27                           heterogenous               +----------+--------+--------+---------------------+------------------+--------+ ICA Mid   99      24      1-39%                                            +----------+--------+--------+---------------------+------------------+--------+ ICA Distal69      24                                                      +----------+--------+--------+---------------------+------------------+--------+ ECA       102     16      No plaque                                       +----------+--------+--------+---------------------+------------------+--------+ +----------+--------+-------+----------------+-------------------+           PSV cm/sEDV cmsDescribe        Arm Pressure (mmHG) +----------+--------+-------+----------------+-------------------+ [id number]     1      Multiphasic, [id number]                 +----------+--------+-------+----------------+-------------------+ +---------+--------+--+--------+--+---------+ VertebralPSV cm/s61EDV cm/s19Antegrade +---------+--------+--+--------+--+---------+  Right Stent(s): +---------------+++-------------+++ Prox to Stent  <50% stenosis +---------------+++-------------+++ Proximal Stent <50% stenosis +---------------+++-------------+++ Mid Stent      <50% stenosis +---------------+++-------------+++ Distal Stent   <50% stenosis +---------------+++-------------+++ Distal to Stent<50% stenosis +---------------+++-------------+++  +---------------+++-------------+++ Prox to Stent  <50% stenosis +---------------+++-------------+++ Proximal Stent <50% stenosis +---------------+++-------------+++ Mid Stent      <50% stenosis +---------------+++-------------+++ Distal Stent   <50% stenosis +---------------+++-------------+++ Distal to Stent<50% stenosis +---------------+++-------------+++  Left Carotid Findings: +----------+--------+--------+--------------------+------------------+--------+  PSV cm/sEDV cm/sStenosis            Plaque DescriptionComments  +----------+--------+--------+--------------------+------------------+--------+ CCA Prox  104     30      No plaque                                      +----------+--------+--------+--------------------+------------------+--------+ CCA Mid                   No plaque                                      +----------+--------+--------+--------------------+------------------+--------+ CCA Distal70      23      No plaque visualized                           +----------+--------+--------+--------------------+------------------+--------+ ICA Prox  82      26                          heterogenous               +----------+--------+--------+--------------------+------------------+--------+ ICA Mid   93      19      1-39%                                          +----------+--------+--------+--------------------+------------------+--------+ ICA Distal66      20                                                     +----------+--------+--------+--------------------+------------------+--------+ ECA       110     15      No plaque                                      +----------+--------+--------+--------------------+------------------+--------+ +----------+--------+--------+----------------+-------------------+           PSV cm/sEDV cm/sDescribe        Arm Pressure (mmHG) +----------+--------+--------+----------------+-------------------+ Subclavian117     0       Multiphasic, [id number]                 +----------+--------+--------+----------------+-------------------+ +---------+--------+--+--------+--+---------+ VertebralPSV cm/s65EDV cm/s19Antegrade +---------+--------+--+--------+--+---------+  Left Stent(s): +---------------+++-------------+++ Prox to Stent  <50% stenosis +---------------+++-------------+++ Proximal Stent <50% stenosis +---------------+++-------------+++ Mid Stent      <50% stenosis +---------------+++-------------+++  Distal Stent   <50% stenosis +---------------+++-------------+++ Distal to Stent<50% stenosis +---------------+++-------------+++  +---------------+++-------------+++ Prox to Stent  <50% stenosis +---------------+++-------------+++ Proximal Stent <50% stenosis +---------------+++-------------+++ Mid Stent      <50% stenosis +---------------+++-------------+++ Distal Stent   <50% stenosis +---------------+++-------------+++ Distal to Stent<50% stenosis +---------------+++-------------+++   Summary: Right Carotid: Velocities in the right ICA are consistent with a 1-39% stenosis.                Non-hemodynamically significant plaque <50% noted in the CCA. The                ECA appears >50% stenosed. There  was no evidence of thrombus,                dissection, atherosclerotic plaque or stenosis in the cervical                carotid system. Left Carotid: Velocities in the left ICA are consistent with a 1-39% stenosis.               Non-hemodynamically significant plaque <50% noted in the CCA. The               ECA appears >50% stenosed. There was no evidence of thrombus,               dissection, atherosclerotic plaque or stenosis in the cervical               carotid system. Vertebrals:  Bilateral vertebral arteries demonstrate antegrade flow. Subclavians: Normal flow hemodynamics were seen in bilateral subclavian              arteries. *See table(s) above for measurements and observations.  Electronically signed by Ida Rogue MD on 04/18/2022 at 3:04:56 PM.    Final     Impression/Plan:    1) Head and Neck Cancer Status: NED, laryngoscopy deferred today as he underwent this earlier this month with ENT  2) Nutritional Status: denies issues PEG tube: none  3) Risk Factors: The patient has been educated about risk factors including tobacco abuse; they understand that avoidance of smoking/tobacco is important to prevent recurrences as well as other cancers.     He is abstaining from any form of tobacco or vaping  4) Swallowing: good function  5)  Thyroid function: Most recent lab test is WNL, continue this lab annually w/ PCP as RT can increase risk of hypothyroidism. Lab Results  Component Value Date   TSH 1.06 01/27/2022   6) Other: followup with me in 1 year.     On date of service, in total, I spent 20 minutes on this encounter. Patient was seen in person.  _____________________________________   Aaron Gibson, MD

## 2022-05-02 ENCOUNTER — Other Ambulatory Visit: Payer: Self-pay | Admitting: Adult Health

## 2022-05-02 DIAGNOSIS — F419 Anxiety disorder, unspecified: Secondary | ICD-10-CM

## 2022-05-16 DIAGNOSIS — H25813 Combined forms of age-related cataract, bilateral: Secondary | ICD-10-CM | POA: Diagnosis not present

## 2022-05-16 DIAGNOSIS — H34212 Partial retinal artery occlusion, left eye: Secondary | ICD-10-CM | POA: Diagnosis not present

## 2022-06-04 ENCOUNTER — Other Ambulatory Visit: Payer: Self-pay | Admitting: Adult Health

## 2022-06-04 DIAGNOSIS — I10 Essential (primary) hypertension: Secondary | ICD-10-CM

## 2022-08-01 DIAGNOSIS — D225 Melanocytic nevi of trunk: Secondary | ICD-10-CM | POA: Diagnosis not present

## 2022-08-01 DIAGNOSIS — L570 Actinic keratosis: Secondary | ICD-10-CM | POA: Diagnosis not present

## 2022-08-01 DIAGNOSIS — L821 Other seborrheic keratosis: Secondary | ICD-10-CM | POA: Diagnosis not present

## 2022-08-01 DIAGNOSIS — L814 Other melanin hyperpigmentation: Secondary | ICD-10-CM | POA: Diagnosis not present

## 2022-08-03 ENCOUNTER — Other Ambulatory Visit: Payer: Self-pay | Admitting: Adult Health

## 2022-08-03 DIAGNOSIS — F419 Anxiety disorder, unspecified: Secondary | ICD-10-CM

## 2022-08-04 NOTE — Telephone Encounter (Signed)
Okay for refill?  

## 2022-08-21 DIAGNOSIS — M47816 Spondylosis without myelopathy or radiculopathy, lumbar region: Secondary | ICD-10-CM | POA: Diagnosis not present

## 2022-08-21 DIAGNOSIS — Z6828 Body mass index (BMI) 28.0-28.9, adult: Secondary | ICD-10-CM | POA: Diagnosis not present

## 2022-09-25 ENCOUNTER — Other Ambulatory Visit: Payer: Self-pay | Admitting: Adult Health

## 2022-09-25 DIAGNOSIS — I10 Essential (primary) hypertension: Secondary | ICD-10-CM

## 2022-09-28 ENCOUNTER — Encounter: Payer: Self-pay | Admitting: Adult Health

## 2022-09-28 ENCOUNTER — Ambulatory Visit (INDEPENDENT_AMBULATORY_CARE_PROVIDER_SITE_OTHER): Payer: 59 | Admitting: Adult Health

## 2022-09-28 VITALS — BP 150/88 | HR 80 | Temp 98.1°F | Ht 68.0 in | Wt 180.0 lb

## 2022-09-28 DIAGNOSIS — I10 Essential (primary) hypertension: Secondary | ICD-10-CM

## 2022-09-28 DIAGNOSIS — J988 Other specified respiratory disorders: Secondary | ICD-10-CM

## 2022-09-28 DIAGNOSIS — R059 Cough, unspecified: Secondary | ICD-10-CM

## 2022-09-28 DIAGNOSIS — J029 Acute pharyngitis, unspecified: Secondary | ICD-10-CM | POA: Diagnosis not present

## 2022-09-28 LAB — POC COVID19 BINAXNOW: SARS Coronavirus 2 Ag: NEGATIVE

## 2022-09-28 LAB — POCT RAPID STREP A (OFFICE): Rapid Strep A Screen: NEGATIVE

## 2022-09-28 MED ORDER — PREDNISONE 10 MG PO TABS: ORAL_TABLET | ORAL | 0 refills | Status: DC

## 2022-09-28 NOTE — Progress Notes (Signed)
Subjective:    Patient ID: Aaron Black, male    DOB: 1956/04/14, 67 y.o.   MRN: [id number]  Sinusitis   67 year old male who  has a past medical history of Anemia, Anxiety, Arm pain, Arthritis, B12 deficiency, Back pain, Cancer (HCC), Cellulitis of arm, left (10/2017), Cervical disc disease, Chest pain at rest (02/25/2013), Chronic lower back pain, Complication of anesthesia, Depression, Diverticulosis of colon, GERD (gastroesophageal reflux disease), Gout, Hyperlipidemia, Hypertension, Joint pain, Leg pain, Leg weakness, Neck pain, Pleurisy, Shortness of breath dyspnea, and Tobacco abuse.  He presents to the office today for an acute issue. He reports that for the last 1-2 weeks he has been experiencing a mostly dry cough he will have some production in the morning.  3 days ago started to have a mild sore throat. Has noticed some wheezing at night   He denies fevers, chills, or sinus pain pressure.   He has been taking Robatussin at home.   Review of Systems See HPI   Past Medical History:  Diagnosis Date   Anemia    Anxiety    Arm pain    Arthritis    B12 deficiency    Back pain    Cancer ([place])    throat cancer    Cellulitis of arm, left 10/2017   after cat scratch   Cervical disc disease    a. 07/2003 ant cervical deomcpression and fusion C5-6/C6-7;  b. 09/2007 post cervical laminectomy C4-5 with scres and arthrodesis C4-C7.   Chest pain at rest 02/25/2013   Chronic lower back pain    Complication of anesthesia    " i WOKE UP DURING A COLONOSCOPY "- 2008   Depression    Diverticulosis of colon    GERD (gastroesophageal reflux disease)    Gout    Hyperlipidemia    Hypertension    Joint pain    Leg pain    While walking   Leg weakness    Neck pain    Pleurisy    Shortness of breath dyspnea    with exertion- "I cant exercise now"   Tobacco abuse    a. 40 yrs, 1.5-3 ppd over that time.    Social History   Socioeconomic History   Marital status: Married     Spouse name: Not on file   Number of children: Not on file   Years of education: Not on file   Highest education level: Not on file  Occupational History   Not on file  Tobacco Use   Smoking status: Former    Packs/day: 1.00    Years: 42.00    Total pack years: 42.00    Types: Cigarettes    Start date: 28    Quit date: 06/08/2019    Years since quitting: 3.3   Smokeless tobacco: Never   Tobacco comments:    He is smoking about 4-5 cigarettes, He had smoked up to 3 packs daily. 06/03/19  Vaping Use   Vaping Use: Never used  Substance and Sexual Activity   Alcohol use: Yes    Comment: occasional   Drug use: No   Sexual activity: Not on file  Other Topics Concern   Not on file  Social History Narrative   Lives in [place] with wife.  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   Social Determinants of Health   Financial Resource Strain: Not on file  Food Insecurity: Not on file  Transportation Needs:  No Transportation Needs (06/03/2019)   PRAPARE - Hydrologist (Medical): No    Lack of Transportation (Non-Medical): No  Physical Activity: Not on file  Stress: Not on file  Social Connections: Not on file  Intimate Partner Violence: Not At Risk (06/03/2019)   Humiliation, Afraid, Rape, and Kick questionnaire    Fear of Current or Ex-Partner: No    Emotionally Abused: No    Physically Abused: No    Sexually Abused: No    Past Surgical History:  Procedure Laterality Date   ABDOMINAL EXPOSURE N/A 01/03/2021   Procedure: ABDOMINAL EXPOSURE;  Surgeon: [name], MD;  Location: [facility];  Service: Vascular;  Laterality: N/A;   ANTERIOR LAT LUMBAR FUSION Left 01/04/2021   Procedure: Removal Of Broken Hardware;  Surgeon: Erline Levine, MD;  Location: [facility];  Service: Neurosurgery;  Laterality: Left;   ANTERIOR LATERAL LUMBAR FUSION 4 LEVELS Right 06/05/2014   Procedure: ANTERIOR LATERAL LUMBAR FUSION  LUMBAR ONE TO  LUMBAR FIVE with Percutaneous Pedicle Screws;  Surgeon: Erline Levine, MD;  Location: [facility];  Service: Neurosurgery;  Laterality: Right;   ANTERIOR LUMBAR FUSION N/A 01/03/2021   Procedure: Lumbar Four-Five Anterior lumbar interbody fusion with revision of graft;  Surgeon: Erline Levine, MD;  Location: [facility];  Service: Neurosurgery;  Laterality: N/A;  Lumbar Four-Five Anterior lumbar interbody fusion with revision of graft   CARPAL TUNNEL RELEASE Right    CERVICAL DISC SURGERY     X 2   COLONOSCOPY     ELBOW ARTHROSCOPY Right    KNEE ARTHROSCOPY Right 08/2013   torn menicus   KNEE ARTHROSCOPY Left 11/2013   chip cartl[place],torn menicus   LUMBAR LAMINECTOMY/DECOMPRESSION MICRODISCECTOMY Right 07/11/2013   Procedure: LUMBAR LAMINECTOMY/DECOMPRESSION MICRODISCECTOMY 1 LEVEL;  Surgeon: Erline Levine, MD;  Location: [facility];  Service: Neurosurgery;  Laterality: Right;  Right L34 microdiskectomy   LUMBAR PERCUTANEOUS PEDICLE SCREW 4 LEVEL N/A 06/05/2014   Procedure: LUMBAR PERCUTANEOUS PEDICLE SCREW 4 LEVEL;  Surgeon: Erline Levine, MD;  Location: [facility];  Service: Neurosurgery;  Laterality: N/A;   MICROLARYNGOSCOPY Left 05/12/2019   Procedure: MICROLARYNGOSCOPY WITH BIOPSY;  Surgeon: Rozetta Nunnery, MD;  Location: [facility];  Service: ENT;  Laterality: Left;   NECK SURGERY  January 2005, March 2009   C-Spine   POLYPECTOMY     ROTATOR CUFF REPAIR  August 2000, January 2002, July 2008, July 2009   2 left 2 right   TOTAL HIP ARTHROPLASTY Left 09/21/2021   Procedure: TOTAL HIP ARTHROPLASTY ANTERIOR APPROACH;  Surgeon: Gaynelle Arabian, MD;  Location: [facility];  Service: Orthopedics;  Laterality: Left;    Family History  Problem Relation Age of Onset   Bone cancer Mother    Squamous cell carcinoma Mother        died @ 31   Heart attack Mother    Hypertension Father    Diabetes Father        borderline   Congestive Heart Failure Father        alive @ 31   Heart  failure Father    Sudden death Brother        died @ 20   Other Brother        died in MVA @ 58 - struck by drunk driver   Other Brother        died in MVA @ 72 - struck by drunk driver   Heart attack Brother  Stroke Paternal Grandfather    Colon cancer Neg Hx    Esophageal cancer Neg Hx    Rectal cancer Neg Hx    Stomach cancer Neg Hx    Colon polyps Neg Hx     Allergies  Allergen Reactions   Lisinopril Swelling    Swollen Lip   Percodan [Oxycodone-Aspirin] Rash    Percodan caused rash---but he can tolerate Percocet and aspirin on their own     Current Outpatient Medications on File Prior to Visit  Medication Sig Dispense Refill   allopurinol (ZYLOPRIM) 100 MG tablet Take 1 tablet (100 mg total) by mouth in the morning. 90 tablet 3   ALPRAZolam (XANAX) 0.5 MG tablet TAKE 1 TABLET BY MOUTH THREE TIMES A DAY 90 tablet 2   amLODipine (NORVASC) 5 MG tablet TAKE 1 TABLET (5 MG TOTAL) BY MOUTH DAILY. 90 tablet 0   atorvastatin (LIPITOR) 80 MG tablet Take 1 tablet (80 mg total) by mouth daily. 90 tablet 3   colchicine 0.6 MG tablet TAKE 1 TABLET (0.6 MG TOTAL) BY MOUTH DAILY AS NEEDED (GOUT). 90 tablet 1   cyclobenzaprine (FLEXERIL) 10 MG tablet Take 1 tablet (10 mg total) by mouth 3 (three) times daily. 30 tablet 0   losartan (COZAAR) 100 MG tablet TAKE 1 TABLET BY MOUTH EVERY DAY 90 tablet 3   omeprazole (PRILOSEC) 40 MG capsule TAKE 1 CAPSULE (40 MG TOTAL) BY MOUTH DAILY. 90 capsule 3   oxyCODONE-acetaminophen (PERCOCET) 10-325 MG tablet Take 1 tablet by mouth every 6 (six) hours as needed.     sildenafil (VIAGRA) 50 MG tablet Take 1 tablet (50 mg total) by mouth daily as needed for erectile dysfunction. 10 tablet 2   vitamin B-12 (CYANOCOBALAMIN) 1000 MCG tablet Take 1,000 mcg by mouth in the morning.     diltiazem (CARDIZEM CD) 360 MG 24 hr capsule Take 1 capsule (360 mg total) by mouth daily. 90 capsule 3   sertraline (ZOLOFT) 50 MG tablet TAKE 1 TABLET BY MOUTH EVERY DAY  (Patient not taking: Reported on 09/28/2022) 90 tablet 1   No current facility-administered medications on file prior to visit.    BP (!) 150/88   Pulse 80   Temp 98.1 F (36.7 C) (Oral)   Ht '5\' 8"'$  (1.727 m)   Wt 180 lb (81.6 kg)   SpO2 97%   BMI 27.37 kg/m       Objective:   Physical Exam Vitals and nursing note reviewed.  Constitutional:      Appearance: Normal appearance.  Cardiovascular:     Rate and Rhythm: Normal rate and regular rhythm.     Pulses: Normal pulses.     Heart sounds: Normal heart sounds.  Pulmonary:     Effort: Pulmonary effort is normal.     Breath sounds: Normal breath sounds.  Musculoskeletal:        General: Normal range of motion.  Skin:    General: Skin is warm and dry.     Capillary Refill: Capillary refill takes less than 2 seconds.  Neurological:     General: No focal deficit present.     Mental Status: He is alert and oriented to person, place, and time.  Psychiatric:        Mood and Affect: Mood normal.        Behavior: Behavior normal.        Thought Content: Thought content normal.       Assessment & Plan:  1. Respiratory infection -  Seems to be viral in nature. Will send in prednisone taper.  - Can stop Robatussin  - Follow up if not resolved by the end of prednisone therapy  - predniSONE (DELTASONE) 10 MG tablet; 40 mg x 3 days, 20 mg x 3 days, 10 mg x 3 days  Dispense: 21 tablet; Refill: 0  2. Cough, unspecified type  - POC COVID-19- negative  3. Sore throat  - POC Rapid Strep A- negative - predniSONE (DELTASONE) 10 MG tablet; 40 mg x 3 days, 20 mg x 3 days, 10 mg x 3 days  Dispense: 21 tablet; Refill: 0  4. Essential (primary) hypertension - Possibly from OTC medications  - Advised to monitor at home and follow up if BP not at goal   Dorothyann Peng, NP

## 2022-10-05 ENCOUNTER — Telehealth: Payer: Self-pay | Admitting: Adult Health

## 2022-10-05 NOTE — Telephone Encounter (Signed)
Blood pressure readings  3/8-136/74,   3/9-138/76,  3/10-135/73,   3/11-141/77,   3/12-132/70,  3/14-140/74

## 2022-10-09 ENCOUNTER — Other Ambulatory Visit: Payer: Self-pay | Admitting: Adult Health

## 2022-10-09 DIAGNOSIS — I10 Essential (primary) hypertension: Secondary | ICD-10-CM

## 2022-10-24 ENCOUNTER — Telehealth: Payer: Self-pay | Admitting: Adult Health

## 2022-10-24 NOTE — Telephone Encounter (Signed)
Patient scraped his arm on a rusty item, requesting tetanus shot.

## 2022-10-24 NOTE — Telephone Encounter (Signed)
Pt struck himself this morning the rusted metal. Pt will go to the pharmacy to get vaccine tomorrow morning. Pt advised to get this vaccine with in 72hrs. Pt verbalized understanding.

## 2022-11-22 DIAGNOSIS — H903 Sensorineural hearing loss, bilateral: Secondary | ICD-10-CM | POA: Diagnosis not present

## 2022-11-27 DIAGNOSIS — J383 Other diseases of vocal cords: Secondary | ICD-10-CM | POA: Diagnosis not present

## 2022-11-27 DIAGNOSIS — R0683 Snoring: Secondary | ICD-10-CM | POA: Diagnosis not present

## 2022-11-27 DIAGNOSIS — H9313 Tinnitus, bilateral: Secondary | ICD-10-CM | POA: Diagnosis not present

## 2022-11-27 DIAGNOSIS — Z8521 Personal history of malignant neoplasm of larynx: Secondary | ICD-10-CM | POA: Diagnosis not present

## 2022-11-27 DIAGNOSIS — H903 Sensorineural hearing loss, bilateral: Secondary | ICD-10-CM | POA: Diagnosis not present

## 2022-12-09 ENCOUNTER — Other Ambulatory Visit: Payer: Self-pay | Admitting: Acute Care

## 2022-12-09 DIAGNOSIS — Z87891 Personal history of nicotine dependence: Secondary | ICD-10-CM

## 2022-12-09 DIAGNOSIS — Z122 Encounter for screening for malignant neoplasm of respiratory organs: Secondary | ICD-10-CM

## 2022-12-13 ENCOUNTER — Other Ambulatory Visit: Payer: Self-pay | Admitting: Adult Health

## 2022-12-13 DIAGNOSIS — F419 Anxiety disorder, unspecified: Secondary | ICD-10-CM

## 2022-12-14 NOTE — Telephone Encounter (Signed)
Okay for refill?  

## 2022-12-15 DIAGNOSIS — Z8521 Personal history of malignant neoplasm of larynx: Secondary | ICD-10-CM | POA: Diagnosis not present

## 2022-12-15 DIAGNOSIS — H9313 Tinnitus, bilateral: Secondary | ICD-10-CM | POA: Diagnosis not present

## 2022-12-15 DIAGNOSIS — R0683 Snoring: Secondary | ICD-10-CM | POA: Diagnosis not present

## 2022-12-15 DIAGNOSIS — H903 Sensorineural hearing loss, bilateral: Secondary | ICD-10-CM | POA: Diagnosis not present

## 2022-12-15 DIAGNOSIS — J383 Other diseases of vocal cords: Secondary | ICD-10-CM | POA: Diagnosis not present

## 2022-12-29 ENCOUNTER — Other Ambulatory Visit: Payer: Medicare Other

## 2023-01-06 ENCOUNTER — Ambulatory Visit (HOSPITAL_COMMUNITY)
Admission: EM | Admit: 2023-01-06 | Discharge: 2023-01-06 | Disposition: A | Discharge status: Home / Self Care | Payer: Medicare HMO | Attending: Physician Assistant | Admitting: Physician Assistant

## 2023-01-06 ENCOUNTER — Other Ambulatory Visit: Payer: Self-pay | Admitting: Adult Health

## 2023-01-06 ENCOUNTER — Encounter (HOSPITAL_COMMUNITY): Payer: Self-pay

## 2023-01-06 DIAGNOSIS — S51852A Open bite of left forearm, initial encounter: Secondary | ICD-10-CM | POA: Diagnosis not present

## 2023-01-06 DIAGNOSIS — W5501XA Bitten by cat, initial encounter: Secondary | ICD-10-CM

## 2023-01-06 DIAGNOSIS — I10 Essential (primary) hypertension: Secondary | ICD-10-CM

## 2023-01-06 DIAGNOSIS — E785 Hyperlipidemia, unspecified: Secondary | ICD-10-CM

## 2023-01-06 DIAGNOSIS — L03114 Cellulitis of left upper limb: Secondary | ICD-10-CM

## 2023-01-06 MED ORDER — AMOXICILLIN-POT CLAVULANATE 875-125 MG PO TABS: 1.0000 | ORAL_TABLET | Freq: Two times a day (BID) | ORAL | 0 refills | Status: AC

## 2023-01-06 NOTE — ED Triage Notes (Signed)
TDAP is unknown

## 2023-01-06 NOTE — ED Provider Notes (Signed)
[facility]   MRN: [id number] DOB: 01/16/1956  Subjective:   Aaron Black is a 67 y.o. male presenting for cat bite to left forearm x 48 hours.  Date of injury was 01/04/2023.  States that he was petting his cat when it bit and scratched his left forearm.  He cleaned the wound immediately with peroxide and applied a liquid bandage.  States that he is now having redness and swelling around the wound site.  Denies any fevers or chills.  States that his cat is up-to-date on vaccinations.  Patient is also up-to-date on tetanus shot 10/31/22.  No current facility-administered medications for this encounter.  Current Outpatient Medications:    ALPRAZolam (XANAX) 0.5 MG tablet, TAKE 1 TABLET BY MOUTH THREE TIMES A DAY, Disp: 90 tablet, Rfl: 2   amLODipine (NORVASC) 5 MG tablet, TAKE 1 TABLET (5 MG TOTAL) BY MOUTH DAILY., Disp: 90 tablet, Rfl: 0   amoxicillin-clavulanate (AUGMENTIN) 875-125 MG tablet, Take 1 tablet by mouth every 12 (twelve) hours for 10 days., Disp: 20 tablet, Rfl: 0   atorvastatin (LIPITOR) 80 MG tablet, Take 1 tablet (80 mg total) by mouth daily., Disp: 90 tablet, Rfl: 3   colchicine 0.6 MG tablet, TAKE 1 TABLET (0.6 MG TOTAL) BY MOUTH DAILY AS NEEDED (GOUT)., Disp: 90 tablet, Rfl: 1   cyclobenzaprine (FLEXERIL) 10 MG tablet, Take 1 tablet (10 mg total) by mouth 3 (three) times daily., Disp: 30 tablet, Rfl: 0   losartan (COZAAR) 100 MG tablet, TAKE 1 TABLET BY MOUTH EVERY DAY, Disp: 90 tablet, Rfl: 3   omeprazole (PRILOSEC) 40 MG capsule, TAKE 1 CAPSULE (40 MG TOTAL) BY MOUTH DAILY., Disp: 90 capsule, Rfl: 3   oxyCODONE-acetaminophen (PERCOCET) 10-325 MG tablet, Take 1 tablet by mouth every 6 (six) hours as needed., Disp: , Rfl:    predniSONE (DELTASONE) 10 MG tablet, 40 mg x 3 days, 20 mg x 3 days, 10 mg x 3 days, Disp: 21 tablet, Rfl: 0   sertraline (ZOLOFT) 50 MG tablet, TAKE 1 TABLET BY MOUTH EVERY DAY, Disp: 90 tablet, Rfl: 1   sildenafil (VIAGRA) 50 MG  tablet, Take 1 tablet (50 mg total) by mouth daily as needed for erectile dysfunction., Disp: 10 tablet, Rfl: 2   vitamin B-12 (CYANOCOBALAMIN) 1000 MCG tablet, Take 1,000 mcg by mouth in the morning., Disp: , Rfl:    allopurinol (ZYLOPRIM) 100 MG tablet, Take 1 tablet (100 mg total) by mouth in the morning., Disp: 90 tablet, Rfl: 3   diltiazem (CARDIZEM CD) 360 MG 24 hr capsule, Take 1 capsule (360 mg total) by mouth daily., Disp: 90 capsule, Rfl: 3   Allergies  Allergen Reactions   Lisinopril Swelling    Swollen Lip   Aspirin Other (See Comments)   Oxycodone Hcl Other (See Comments)   Oxycodone-Aspirin Other (See Comments) and Rash    Percodan caused rash---but he can tolerate Percocet and aspirin on their own   Percodan [Oxycodone-Aspirin] Rash    Percodan caused rash---but he can tolerate Percocet and aspirin on their own     Past Medical History:  Diagnosis Date   Anemia    Anxiety    Arm pain    Arthritis    B12 deficiency    Back pain    Cancer (HCC)    throat cancer    Cellulitis of arm, left 10/2017   after cat scratch   Cervical disc disease    a. 07/2003 ant cervical deomcpression and  fusion C5-6/C6-7;  b. 09/2007 post cervical laminectomy C4-5 with scres and arthrodesis C4-C7.   Chest pain at rest 02/25/2013   Chronic lower back pain    Complication of anesthesia    " i WOKE UP DURING A COLONOSCOPY "- 2008   Depression    Diverticulosis of colon    GERD (gastroesophageal reflux disease)    Gout    Hyperlipidemia    Hypertension    Joint pain    Leg pain    While walking   Leg weakness    Neck pain    Pleurisy    Shortness of breath dyspnea    with exertion- "I cant exercise now"   Tobacco abuse    a. 40 yrs, 1.5-3 ppd over that time.     Past Surgical History:  Procedure Laterality Date   ABDOMINAL EXPOSURE N/A 01/03/2021   Procedure: ABDOMINAL EXPOSURE;  Surgeon: Cephus Shelling, MD;  Location: [facility] OR;  Service: Vascular;  Laterality: N/A;    ANTERIOR LAT LUMBAR FUSION Left 01/04/2021   Procedure: Removal Of Broken Hardware;  Surgeon: Maeola Harman, MD;  Location: [facility] OR;  Service: Neurosurgery;  Laterality: Left;   ANTERIOR LATERAL LUMBAR FUSION 4 LEVELS Right 06/05/2014   Procedure: ANTERIOR LATERAL LUMBAR FUSION  LUMBAR ONE TO LUMBAR FIVE with Percutaneous Pedicle Screws;  Surgeon: Maeola Harman, MD;  Location: [facility];  Service: Neurosurgery;  Laterality: Right;   ANTERIOR LUMBAR FUSION N/A 01/03/2021   Procedure: Lumbar Four-Five Anterior lumbar interbody fusion with revision of graft;  Surgeon: Maeola Harman, MD;  Location: [facility] OR;  Service: Neurosurgery;  Laterality: N/A;  Lumbar Four-Five Anterior lumbar interbody fusion with revision of graft   CARPAL TUNNEL RELEASE Right    CERVICAL DISC SURGERY     X 2   COLONOSCOPY     ELBOW ARTHROSCOPY Right    KNEE ARTHROSCOPY Right 08/2013   torn menicus   KNEE ARTHROSCOPY Left 11/2013   chip cartlidge,torn menicus   LUMBAR LAMINECTOMY/DECOMPRESSION MICRODISCECTOMY Right 07/11/2013   Procedure: LUMBAR LAMINECTOMY/DECOMPRESSION MICRODISCECTOMY 1 LEVEL;  Surgeon: Maeola Harman, MD;  Location: [facility];  Service: Neurosurgery;  Laterality: Right;  Right L34 microdiskectomy   LUMBAR PERCUTANEOUS PEDICLE SCREW 4 LEVEL N/A 06/05/2014   Procedure: LUMBAR PERCUTANEOUS PEDICLE SCREW 4 LEVEL;  Surgeon: Maeola Harman, MD;  Location: [facility];  Service: Neurosurgery;  Laterality: N/A;   MICROLARYNGOSCOPY Left 05/12/2019   Procedure: MICROLARYNGOSCOPY WITH BIOPSY;  Surgeon: Drema Halon, MD;  Location: [facility];  Service: ENT;  Laterality: Left;   NECK SURGERY  January 2005, March 2009   C-Spine   POLYPECTOMY     ROTATOR CUFF REPAIR  August 2000, January 2002, July 2008, July 2009   2 left 2 right   TOTAL HIP ARTHROPLASTY Left 09/21/2021   Procedure: TOTAL HIP ARTHROPLASTY ANTERIOR APPROACH;  Surgeon: Ollen Gross, MD;  Location: [facility];  Service: Orthopedics;   Laterality: Left;    Family History  Problem Relation Age of Onset   Bone cancer Mother    Squamous cell carcinoma Mother        died @ 69   Heart attack Mother    Hypertension Father    Diabetes Father        borderline   Congestive Heart Failure Father        alive @ 23   Heart failure Father    Sudden death Brother        died @ 66  Other Brother        died in MVA @ 83 - struck by drunk driver   Other Brother        died in MVA @ 57 - struck by drunk driver   Heart attack Brother    Stroke Paternal Grandfather    Colon cancer Neg Hx    Esophageal cancer Neg Hx    Rectal cancer Neg Hx    Stomach cancer Neg Hx    Colon polyps Neg Hx     Social History   Tobacco Use   Smoking status: Former    Packs/day: 1.00    Years: 42.00    Additional pack years: 0.00    Total pack years: 42.00    Types: Cigarettes    Start date: 74    Quit date: 06/08/2019    Years since quitting: 3.5   Smokeless tobacco: Never   Tobacco comments:    He is smoking about 4-5 cigarettes, He had smoked up to 3 packs daily. 06/03/19  Vaping Use   Vaping Use: Never used  Substance Use Topics   Alcohol use: Yes    Comment: occasional   Drug use: No    ROS REFER TO HPI FOR PERTINENT POSITIVES AND NEGATIVES   Objective:   Vitals: BP 134/88 (BP Location: Left Arm)   Pulse 96   Temp 98.6 F (37 C) (Oral)   Resp 16   SpO2 97%   Physical Exam  Well-appearing.  Left forearm has area of puncture wound and scratch wound from cat as shown in photo below. Area of erythema and minor edema surrounding this area of 16 x 12.5 cm. Some tenderness to palpation.      No results found for this or any previous visit (from the past 24 hour(s)).  Assessment and Plan :   PDMP not reviewed this encounter.  1. Cat bite of left forearm, initial encounter    Cellulitis of left forearm secondary to cat bite approximately 48 hours ago.  No signs of deep infection or tenosynovitis.  Did not  feel any abscess.  Plan to start him on Augmentin immediately today.  Tetanus is up-to-date.  Red flags discussed with patient.  He knows to present to the emergency department if suddenly worsening redness or pain, redness expands past marked area, or fevers develop. F/up with his incredible PCP this coming week.     AllwardtCrist Infante, PA-C 01/06/23 1149

## 2023-01-06 NOTE — Discharge Instructions (Signed)
Good to meet you.  Start on the Augmentin immediately today.  If the redness expands past the marked area this weekend, you experience worsening pain or swelling, or fevers develop, you need to go to the emergency department.

## 2023-01-06 NOTE — ED Triage Notes (Signed)
Pt is here for cat bite on his left forearm. Cat is uo to date with his vaccine. Pt reports arm swelling and pain.

## 2023-01-15 ENCOUNTER — Other Ambulatory Visit: Payer: Self-pay | Admitting: Adult Health

## 2023-01-15 DIAGNOSIS — I10 Essential (primary) hypertension: Secondary | ICD-10-CM

## 2023-02-05 ENCOUNTER — Other Ambulatory Visit: Payer: Self-pay | Admitting: Adult Health

## 2023-02-05 DIAGNOSIS — L570 Actinic keratosis: Secondary | ICD-10-CM | POA: Diagnosis not present

## 2023-02-05 DIAGNOSIS — L821 Other seborrheic keratosis: Secondary | ICD-10-CM | POA: Diagnosis not present

## 2023-02-05 DIAGNOSIS — L814 Other melanin hyperpigmentation: Secondary | ICD-10-CM | POA: Diagnosis not present

## 2023-02-05 DIAGNOSIS — D225 Melanocytic nevi of trunk: Secondary | ICD-10-CM | POA: Diagnosis not present

## 2023-02-06 ENCOUNTER — Other Ambulatory Visit: Payer: Self-pay | Admitting: Adult Health

## 2023-02-06 DIAGNOSIS — M1000 Idiopathic gout, unspecified site: Secondary | ICD-10-CM

## 2023-02-19 ENCOUNTER — Other Ambulatory Visit: Payer: Self-pay | Admitting: Adult Health

## 2023-02-19 DIAGNOSIS — I10 Essential (primary) hypertension: Secondary | ICD-10-CM

## 2023-02-19 DIAGNOSIS — M1000 Idiopathic gout, unspecified site: Secondary | ICD-10-CM

## 2023-03-06 ENCOUNTER — Ambulatory Visit (INDEPENDENT_AMBULATORY_CARE_PROVIDER_SITE_OTHER): Payer: Medicare HMO | Admitting: Adult Health

## 2023-03-06 ENCOUNTER — Ambulatory Visit (INDEPENDENT_AMBULATORY_CARE_PROVIDER_SITE_OTHER): Payer: Medicare HMO

## 2023-03-06 VITALS — BP 130/80 | HR 76 | Temp 98.0°F | Ht 68.0 in | Wt 169.0 lb

## 2023-03-06 DIAGNOSIS — J40 Bronchitis, not specified as acute or chronic: Secondary | ICD-10-CM

## 2023-03-06 DIAGNOSIS — R918 Other nonspecific abnormal finding of lung field: Secondary | ICD-10-CM | POA: Diagnosis not present

## 2023-03-06 DIAGNOSIS — R0602 Shortness of breath: Secondary | ICD-10-CM | POA: Diagnosis not present

## 2023-03-06 MED ORDER — PREDNISONE 10 MG PO TABS: ORAL_TABLET | ORAL | 0 refills | Status: DC

## 2023-03-06 MED ORDER — ALBUTEROL SULFATE HFA 108 (90 BASE) MCG/ACT IN AERS
2.0000 | INHALATION_SPRAY | Freq: Four times a day (QID) | RESPIRATORY_TRACT | 0 refills | Status: DC | PRN
Start: 2023-03-06 — End: 2023-04-03

## 2023-03-06 NOTE — Progress Notes (Signed)
Subjective:    Patient ID: Aaron Black, male    DOB: [birth year], 67 y.o.   MRN: [id number]  Sinusitis    67 year old male who  has a past medical history of Anemia, Anxiety, Arm pain, Arthritis, B12 deficiency, Back pain, Cancer (HCC), Cellulitis of arm, left (10/2017), Cervical disc disease, Chest pain at rest (02/25/2013), Chronic lower back pain, Complication of anesthesia, Depression, Diverticulosis of colon, GERD (gastroesophageal reflux disease), Gout, Hyperlipidemia, Hypertension, Joint pain, Leg pain, Leg weakness, Neck pain, Pleurisy, Shortness of breath dyspnea, and Tobacco abuse.  He presents to the office today with his wife for an acute issue. His symptoms started about 2 weeks ago. Symptoms include that of nasal congestion with clear nasal drainage, Productive cough with clear sputum and headache. He denies fevers or chills.   He reports that he recently had a close friend pass away and has been helping clean out their attack where is it dusty.   Review of Systems See HPI   Past Medical History:  Diagnosis Date   Anemia    Anxiety    Arm pain    Arthritis    B12 deficiency    Back pain    Cancer (HCC)    throat cancer    Cellulitis of arm, left 10/2017   after cat scratch   Cervical disc disease    a. 07/2003 ant cervical deomcpression and fusion C5-6/C6-7;  b. 09/2007 post cervical laminectomy C4-5 with scres and arthrodesis C4-C7.   Chest pain at rest 02/25/2013   Chronic lower back pain    Complication of anesthesia    " i WOKE UP DURING A COLONOSCOPY "- 2008   Depression    Diverticulosis of colon    GERD (gastroesophageal reflux disease)    Gout    Hyperlipidemia    Hypertension    Joint pain    Leg pain    While walking   Leg weakness    Neck pain    Pleurisy    Shortness of breath dyspnea    with exertion- "I cant exercise now"   Tobacco abuse    a. 40 yrs, 1.5-3 ppd over that time.    Social History   Socioeconomic History   Marital  status: Married    Spouse name: Not on file   Number of children: Not on file   Years of education: Not on file   Highest education level: Not on file  Occupational History   Not on file  Tobacco Use   Smoking status: Former    Current packs/day: 0.00    Average packs/day: 1 pack/day for 42.0 years (42.0 ttl pk-yrs)    Types: Cigarettes    Start date: 69    Quit date: 06/08/2019    Years since quitting: 3.7   Smokeless tobacco: Never   Tobacco comments:    He is smoking about 4-5 cigarettes, He had smoked up to 3 packs daily. 06/03/19  Vaping Use   Vaping status: Never Used  Substance and Sexual Activity   Alcohol use: Yes    Comment: occasional   Drug use: No   Sexual activity: Not on file  Other Topics Concern   Not on file  Social History Narrative   Lives in [place] with wife.  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   Social Determinants of Health   Financial Resource Strain: Not on file  Food Insecurity: Not on file  Transportation Needs:  No Transportation Needs (06/03/2019)   PRAPARE - Administrator, Civil Service (Medical): No    Lack of Transportation (Non-Medical): No  Physical Activity: Not on file  Stress: Not on file  Social Connections: Not on file  Intimate Partner Violence: Not At Risk (06/03/2019)   Humiliation, Afraid, Rape, and Kick questionnaire    Fear of Current or Ex-Partner: No    Emotionally Abused: No    Physically Abused: No    Sexually Abused: No    Past Surgical History:  Procedure Laterality Date   ABDOMINAL EXPOSURE N/A 01/03/2021   Procedure: ABDOMINAL EXPOSURE;  Surgeon: Cephus Shelling, MD;  Location: [facility] OR;  Service: Vascular;  Laterality: N/A;   ANTERIOR LAT LUMBAR FUSION Left 01/04/2021   Procedure: Removal Of Broken Hardware;  Surgeon: Maeola Harman, MD;  Location: [facility] OR;  Service: Neurosurgery;  Laterality: Left;   ANTERIOR LATERAL LUMBAR FUSION 4 LEVELS Right 06/05/2014    Procedure: ANTERIOR LATERAL LUMBAR FUSION  LUMBAR ONE TO LUMBAR FIVE with Percutaneous Pedicle Screws;  Surgeon: Maeola Harman, MD;  Location: [facility];  Service: Neurosurgery;  Laterality: Right;   ANTERIOR LUMBAR FUSION N/A 01/03/2021   Procedure: Lumbar Four-Five Anterior lumbar interbody fusion with revision of graft;  Surgeon: Maeola Harman, MD;  Location: [facility] OR;  Service: Neurosurgery;  Laterality: N/A;  Lumbar Four-Five Anterior lumbar interbody fusion with revision of graft   CARPAL TUNNEL RELEASE Right    CERVICAL DISC SURGERY     X 2   COLONOSCOPY     ELBOW ARTHROSCOPY Right    KNEE ARTHROSCOPY Right 08/2013   torn menicus   KNEE ARTHROSCOPY Left 11/2013   chip cartlidge,torn menicus   LUMBAR LAMINECTOMY/DECOMPRESSION MICRODISCECTOMY Right 07/11/2013   Procedure: LUMBAR LAMINECTOMY/DECOMPRESSION MICRODISCECTOMY 1 LEVEL;  Surgeon: Maeola Harman, MD;  Location: [facility];  Service: Neurosurgery;  Laterality: Right;  Right L34 microdiskectomy   LUMBAR PERCUTANEOUS PEDICLE SCREW 4 LEVEL N/A 06/05/2014   Procedure: LUMBAR PERCUTANEOUS PEDICLE SCREW 4 LEVEL;  Surgeon: Maeola Harman, MD;  Location: [facility];  Service: Neurosurgery;  Laterality: N/A;   MICROLARYNGOSCOPY Left 05/12/2019   Procedure: MICROLARYNGOSCOPY WITH BIOPSY;  Surgeon: Drema Halon, MD;  Location: [facility];  Service: ENT;  Laterality: Left;   NECK SURGERY  January 2005, March 2009   C-Spine   POLYPECTOMY     ROTATOR CUFF REPAIR  August 2000, January 2002, July 2008, July 2009   2 left 2 right   TOTAL HIP ARTHROPLASTY Left 09/21/2021   Procedure: TOTAL HIP ARTHROPLASTY ANTERIOR APPROACH;  Surgeon: Ollen Gross, MD;  Location: [facility];  Service: Orthopedics;  Laterality: Left;    Family History  Problem Relation Age of Onset   Bone cancer Mother    Squamous cell carcinoma Mother        died @ 39   Heart attack Mother    Hypertension Father    Diabetes Father        borderline    Congestive Heart Failure Father        alive @ 71   Heart failure Father    Sudden death Brother        died @ 63   Other Brother        died in MVA @ 44 - struck by drunk driver   Other Brother        died in MVA @ 46 - struck by drunk driver   Heart attack Brother  Stroke Paternal Grandfather    Colon cancer Neg Hx    Esophageal cancer Neg Hx    Rectal cancer Neg Hx    Stomach cancer Neg Hx    Colon polyps Neg Hx     Allergies  Allergen Reactions   Lisinopril Swelling    Swollen Lip   Aspirin Other (See Comments)   Oxycodone Hcl Other (See Comments)   Oxycodone-Aspirin Other (See Comments) and Rash    Percodan caused rash---but he can tolerate Percocet and aspirin on their own   Percodan [Oxycodone-Aspirin] Rash    Percodan caused rash---but he can tolerate Percocet and aspirin on their own     Current Outpatient Medications on File Prior to Visit  Medication Sig Dispense Refill   allopurinol (ZYLOPRIM) 100 MG tablet TAKE 1 TABLET (100 MG TOTAL) BY MOUTH IN THE MORNING 90 tablet 0   ALPRAZolam (XANAX) 0.5 MG tablet TAKE 1 TABLET BY MOUTH THREE TIMES A DAY 90 tablet 2   amLODipine (NORVASC) 5 MG tablet TAKE 1 TABLET (5 MG TOTAL) BY MOUTH DAILY. 90 tablet 0   atorvastatin (LIPITOR) 80 MG tablet TAKE 1 TABLET BY MOUTH EVERY DAY 90 tablet 3   colchicine 0.6 MG tablet TAKE 1 TABLET (0.6 MG TOTAL) BY MOUTH DAILY AS NEEDED (GOUT). 90 tablet 1   diltiazem (CARDIZEM CD) 360 MG 24 hr capsule TAKE 1 CAPSULE BY MOUTH EVERY DAY 90 capsule 3   gabapentin (NEURONTIN) 300 MG capsule gabapentin 300 mg capsule TAKE 1 CAPSULE BY MOUTH FOUR TIMES A DAY     losartan (COZAAR) 100 MG tablet TAKE 1 TABLET BY MOUTH EVERY DAY 90 tablet 3   omeprazole (PRILOSEC) 40 MG capsule TAKE 1 CAPSULE (40 MG TOTAL) BY MOUTH DAILY. 90 capsule 3   sertraline (ZOLOFT) 50 MG tablet TAKE 1 TABLET BY MOUTH EVERY DAY 90 tablet 1   sildenafil (VIAGRA) 50 MG tablet Take 1 tablet (50 mg total) by mouth daily as needed  for erectile dysfunction. 10 tablet 2   vitamin B-12 (CYANOCOBALAMIN) 1000 MCG tablet Take 1,000 mcg by mouth in the morning.     No current facility-administered medications on file prior to visit.    BP 130/80   Pulse 76   Temp 98 F (36.7 C) (Oral)   Ht 5\' 8"  (1.727 m)   Wt 169 lb (76.7 kg)   SpO2 99%   BMI 25.70 kg/m       Objective:   Physical Exam Vitals and nursing note reviewed.  Constitutional:      Appearance: Normal appearance.  Cardiovascular:     Rate and Rhythm: Normal rate and regular rhythm.     Pulses: Normal pulses.     Heart sounds: Normal heart sounds.  Pulmonary:     Effort: Pulmonary effort is normal.     Breath sounds: No stridor. Examination of the right-upper field reveals wheezing. Examination of the left-upper field reveals wheezing. Examination of the right-middle field reveals wheezing. Examination of the left-middle field reveals wheezing. Examination of the right-lower field reveals wheezing. Examination of the left-lower field reveals wheezing. Wheezing present. No rhonchi.  Skin:    General: Skin is warm and dry.  Neurological:     General: No focal deficit present.     Mental Status: He is alert and oriented to person, place, and time.  Psychiatric:        Mood and Affect: Mood normal.        Thought Content: Thought content normal.  Judgment: Judgment normal.       Assessment & Plan:   1. Bronchitis - Appears to be bronchial. Will send in prednisone and inhaler. Will do xray due to smoking history.  - predniSONE (DELTASONE) 10 MG tablet; 40 mg x 3 days, 20 mg x 3 days, 10 mg x 3 days  Dispense: 21 tablet; Refill: 0 - albuterol (VENTOLIN HFA) 108 (90 Base) MCG/ACT inhaler; Inhale 2 puffs into the lungs every 6 (six) hours as needed for wheezing or shortness of breath.  Dispense: 8 g; Refill: 0 - DG Chest 2 View; Future   Shirline Frees, NP

## 2023-03-12 ENCOUNTER — Telehealth: Payer: Self-pay | Admitting: Adult Health

## 2023-03-15 ENCOUNTER — Other Ambulatory Visit: Payer: Self-pay | Admitting: Adult Health

## 2023-03-15 MED ORDER — TRELEGY ELLIPTA 100-62.5-25 MCG/ACT IN AEPB: 1.0000 | INHALATION_SPRAY | Freq: Every day | RESPIRATORY_TRACT | 3 refills | Status: DC

## 2023-04-02 ENCOUNTER — Other Ambulatory Visit: Payer: Self-pay | Admitting: Adult Health

## 2023-04-02 DIAGNOSIS — J40 Bronchitis, not specified as acute or chronic: Secondary | ICD-10-CM

## 2023-04-06 NOTE — Progress Notes (Signed)
Aaron Black presents today for follow-up of radiation to his larynx completed on 07/17/2019.  Chest CT on 04-16-23  IMPRESSION: 1. Lung-RADS 2S, benign appearance or behavior. Continue annual screening with low-dose chest CT without contrast in 12 months. 2. The "S" modifier above refers to potentially clinically significant non lung cancer related findings. Specifically, there is aortic atherosclerosis, in addition to left main and 2 vessel coronary artery disease. Please note that although the presence of coronary artery calcium documents the presence of coronary artery disease, the severity of this disease and any potential stenosis cannot be assessed on this non-gated CT examination. Assessment for potential risk factor modification, dietary therapy or pharmacologic therapy may be warranted, if clinically indicated. 3. Mild diffuse bronchial wall thickening with mild centrilobular and paraseptal emphysema; imaging findings suggestive of underlying COPD.  Pain issues, if any: pt does have intermittent chest pain, he thinks it is coming from cough and muscle pull Using a feeding tube?: no Weight changes, if any:  Wt Readings from Last 3 Encounters:  04/20/23 168 lb 2 oz (76.3 kg)  03/06/23 169 lb (76.7 kg)  09/28/22 180 lb (81.6 kg)   Swallowing issues, if any: strangled at times on water intermittently, eating and drinking well overall Smoking or chewing tobacco? no Using fluoride toothpaste daily? no Last ENT visit was on: Dr. Irene Limbo on 11-27-22 Other notable issues, if any: Pt recently diagnosed with copd and has been on trelegy for a month. He reports his voice is more hoarse with trelegy use. Mouth is more dry on trelegy.   Vitals:   04/20/23 0944  BP: (!) 161/90  Pulse: 89  Resp: 18  Temp: 98 F (36.7 C)  SpO2: 99%     Dr. Irene Limbo on 11-27-22 Comments: Flexible Laryngoscopy Procedure Note  Indications: Cancer surveillance  Risks, benefits and clinical  relevance of the study were discussed. The patient understands and agrees to proceed.   Procedure: After adequate topical anesthetic was applied, 4 mm flexible laryngoscope was passed through the nasal cavity without difficulty. Flexible laryngoscopy shows patent anterior nasal cavity with minimal crusting, no discharge or infection.  Normal base of tongue and supraglottis Normal vocal cord mobility without vocal cord nodule, mass, polyp or tumor. Mild scarring noted on true cords, no evidence of recurrence. Hypopharynx normal without mass, pooling of secretions or aspiration.  Patient tolerated procedure without complication or difficulty.

## 2023-04-08 ENCOUNTER — Other Ambulatory Visit: Payer: Self-pay | Admitting: Adult Health

## 2023-04-08 DIAGNOSIS — F419 Anxiety disorder, unspecified: Secondary | ICD-10-CM

## 2023-04-10 NOTE — Telephone Encounter (Signed)
Okay for refill?

## 2023-04-16 ENCOUNTER — Ambulatory Visit
Admission: RE | Admit: 2023-04-16 | Discharge: 2023-04-16 | Disposition: A | Discharge status: Home / Self Care | Payer: Medicare HMO | Source: Ambulatory Visit | Attending: Acute Care | Admitting: Acute Care

## 2023-04-16 DIAGNOSIS — Z87891 Personal history of nicotine dependence: Secondary | ICD-10-CM

## 2023-04-16 DIAGNOSIS — Z122 Encounter for screening for malignant neoplasm of respiratory organs: Secondary | ICD-10-CM

## 2023-04-20 ENCOUNTER — Encounter: Payer: Self-pay | Admitting: Radiation Oncology

## 2023-04-20 ENCOUNTER — Ambulatory Visit
Admission: RE | Admit: 2023-04-20 | Discharge: 2023-04-20 | Disposition: A | Discharge status: Home / Self Care | Payer: Medicare HMO | Source: Ambulatory Visit | Attending: Radiation Oncology | Admitting: Radiation Oncology

## 2023-04-20 VITALS — BP 161/90 | HR 89 | Temp 98.0°F | Resp 18 | Ht 68.0 in | Wt 168.1 lb

## 2023-04-20 DIAGNOSIS — C320 Malignant neoplasm of glottis: Secondary | ICD-10-CM | POA: Insufficient documentation

## 2023-04-20 DIAGNOSIS — Z08 Encounter for follow-up examination after completed treatment for malignant neoplasm: Secondary | ICD-10-CM | POA: Diagnosis not present

## 2023-04-20 DIAGNOSIS — C140 Malignant neoplasm of pharynx, unspecified: Secondary | ICD-10-CM

## 2023-04-20 DIAGNOSIS — Z8521 Personal history of malignant neoplasm of larynx: Secondary | ICD-10-CM | POA: Insufficient documentation

## 2023-04-20 LAB — TSH: TSH: 1.089 u[IU]/mL (ref 0.350–4.500)

## 2023-04-20 MED ORDER — OXYMETAZOLINE HCL 0.05 % NA SOLN
1.0000 | Freq: Two times a day (BID) | NASAL | Status: DC
  Administered 2023-04-20: 1 via NASAL
  Filled 2023-04-20: qty 30

## 2023-04-20 MED ORDER — OXYMETAZOLINE HCL 0.05 % NA SOLN
1.0000 | Freq: Once | NASAL | Status: AC
  Administered 2023-04-20: 1 via NASAL
  Filled 2023-04-20: qty 30

## 2023-04-20 NOTE — Progress Notes (Signed)
Radiation Oncology         [phone] ________________________________  Name: Aaron Black MRN: [id number]  Date: 04/20/2023  DOB: Aug 09, 1955  Follow-Up Visit Note - Outpatient  CC: Shirline Frees, NP  Drema Halon, *  Diagnosis and Prior Radiotherapy:       ICD-10-CM   1. 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 oxymetazoline (AFRIN) 0.05 % nasal spray 1 spray    TSH    DISCONTINUED: oxymetazoline (AFRIN) 0.05 % nasal spray 1 spray      Cancer Staging  Glottis carcinoma (HCC) Staging form: Larynx - Glottis, AJCC 8th Edition - Clinical stage from 06/03/2019: Stage I (cT1b, cN0, cM0) - Signed by Lonie Peak, MD on 06/03/2019 Stage prefix: Initial diagnosis   Radiation Treatment Dates: 06/09/2019 through 07/17/2019  Site Technique Total Dose (Gy) Dose per Fx (Gy) Completed Fx Beam Energies  Larynx: HN_larynx 3D 63/63 2.25 28/28 6X   CHIEF COMPLAINT:  Here for follow-up and surveillance of throat cancer  Narrative:       Pain issues, if any: pt does have intermittent chest pain, he thinks it is coming from cough and muscle pull Using a feeding tube?: no Weight changes, if any:  Wt Readings from Last 3 Encounters:  04/20/23 168 lb 2 oz (76.3 kg)  03/06/23 169 lb (76.7 kg)  09/28/22 180 lb (81.6 kg)   Swallowing issues, if any: strangled at times on water intermittently, eating and drinking well overall Smoking or chewing tobacco? no Using fluoride toothpaste daily? no Last ENT visit was on: Dr. Marene Lenz on 11-27-22 Other notable issues, if any: Pt recently diagnosed with copd and has been on trelegy for a month. He reports his voice is more hoarse with trelegy use. Mouth is more dry on trelegy.   Vitals:   04/20/23 0944  BP: (!) 161/90  Pulse: 89  Resp: 18  Temp: 98 F (36.7 C)  SpO2: 99%    Dr. Irene Limbo on 11-27-22: Mild scarring noted on true cords, no evidence of recurrence.   Other notable issues, if any:  None; he has successful hip surgery recently and is doing well  BP (!) 161/90 (BP Location: Left Arm, Patient Position: Sitting)   Pulse 89   Temp 98 F (36.7 C) (Temporal)   Resp 18   Ht 5\' 8"  (1.727 m)   Wt 168 lb 2 oz (76.3 kg)   SpO2 99%   BMI 25.56 kg/m         ALLERGIES:  is allergic to lisinopril, aspirin, oxycodone hcl, oxycodone-aspirin, and percodan [oxycodone-aspirin].  Meds: Current Outpatient Medications  Medication Sig Dispense Refill   albuterol (VENTOLIN HFA) 108 (90 Base) MCG/ACT inhaler TAKE 2 PUFFS BY MOUTH EVERY 6 HOURS AS NEEDED FOR WHEEZE OR SHORTNESS OF BREATH 8.5 each 0   allopurinol (ZYLOPRIM) 100 MG tablet TAKE 1 TABLET (100 MG TOTAL) BY MOUTH IN THE MORNING 90 tablet 0   ALPRAZolam (XANAX) 0.5 MG tablet TAKE 1 TABLET BY MOUTH THREE TIMES A DAY 90 tablet 2   amLODipine (NORVASC) 5 MG tablet TAKE 1 TABLET (5 MG TOTAL) BY MOUTH DAILY. 90 tablet 0   atorvastatin (LIPITOR) 80 MG tablet TAKE 1 TABLET BY MOUTH EVERY DAY 90 tablet 3   colchicine 0.6 MG tablet TAKE 1 TABLET (0.6 MG TOTAL) BY MOUTH DAILY AS NEEDED (GOUT). 90 tablet 1   diltiazem (CARDIZEM CD) 360 MG 24 hr capsule TAKE 1 CAPSULE  BY MOUTH EVERY DAY 90 capsule 3   Fluticasone-Umeclidin-Vilant (TRELEGY ELLIPTA) 100-62.5-25 MCG/ACT AEPB Inhale 1 puff into the lungs daily. 3 each 3   gabapentin (NEURONTIN) 300 MG capsule gabapentin 300 mg capsule TAKE 1 CAPSULE BY MOUTH FOUR TIMES A DAY     losartan (COZAAR) 100 MG tablet TAKE 1 TABLET BY MOUTH EVERY DAY 90 tablet 3   omeprazole (PRILOSEC) 40 MG capsule TAKE 1 CAPSULE (40 MG TOTAL) BY MOUTH DAILY. 90 capsule 3   predniSONE (DELTASONE) 10 MG tablet 40 mg x 3 days, 20 mg x 3 days, 10 mg x 3 days 21 tablet 0   sertraline (ZOLOFT) 50 MG tablet TAKE 1 TABLET BY MOUTH EVERY DAY 90 tablet 1   sildenafil (VIAGRA) 50 MG tablet Take 1 tablet (50 mg total) by mouth daily as needed for erectile dysfunction. 10 tablet 2   vitamin B-12 (CYANOCOBALAMIN) 1000 MCG tablet  Take 1,000 mcg by mouth in the morning.     No current facility-administered medications for this encounter.    Physical Findings: The patient is in no acute distress. Patient is alert and oriented. Wt Readings from Last 3 Encounters:  04/20/23 168 lb 2 oz (76.3 kg)  03/06/23 169 lb (76.7 kg)  09/28/22 180 lb (81.6 kg)    height is 5\' 8"  (1.727 m) and weight is 168 lb 2 oz (76.3 kg). His temporal temperature is 98 F (36.7 C). His blood pressure is 161/90 (abnormal) and his pulse is 89. His respiration is 18 and oxygen saturation is 99%. .  General: Alert and oriented, in no acute distress HEENT: Head is normocephalic. Extraocular movements are intact. Oropharynx is clear. Neck: Neck is supple, no palpable cervical or supraclavicular lymphadenopathy. Extremities: No cyanosis or edema. Lymphatics: see Neck Exam Heart regular rate and rhythm Chest clear to auscultation bilaterally Psychiatric: Judgment and insight are intact. Affect is appropriate. MSK: Ambulatory independently  PROCEDURE NOTE: After obtaining consent and spraying nasal cavity with topical oxymetazoline, the flexible endoscope was coated with lidocaine gel and introduced and passed through the nasal cavity.  The nasopharynx, oropharynx, hypopharynx, and larynx  were then examined. No lesions appreciated in the mucosal axis.  The true cords were symmetrically mobile. No evidence of tumor or nodularity. He tolerated this well.   Lab Findings: Lab Results  Component Value Date   WBC 8.5 02/05/2022   HGB 12.1 (L) 02/05/2022   HCT 36.5 (L) 02/05/2022   MCV 85.7 02/05/2022   PLT 277 02/05/2022    Lab Results  Component Value Date   TSH 1.089 04/20/2023    Radiographic Findings: CT CHEST LUNG CA SCREEN LOW DOSE W/O CM  Result Date: 04/20/2023 CLINICAL DATA:  67 year old male former smoker (quit 5 years ago) with 60 pack-year history of smoking. Lung cancer screening examination. EXAM: CT CHEST WITHOUT CONTRAST  LOW-DOSE FOR LUNG CANCER SCREENING TECHNIQUE: Multidetector CT imaging of the chest was performed following the standard protocol without IV contrast. RADIATION DOSE REDUCTION: This exam was performed according to the departmental dose-optimization program which includes automated exposure control, adjustment of the mA and/or kV according to patient size and/or use of iterative reconstruction technique. COMPARISON:  Low-dose lung cancer screening chest CT 12/22/2021. Chest CTA 02/05/2022. FINDINGS: Cardiovascular: Heart size is normal. There is no significant pericardial fluid, thickening or pericardial calcification. There is aortic atherosclerosis, as well as atherosclerosis of the great vessels of the mediastinum and the coronary arteries, including calcified atherosclerotic plaque in the left main, left anterior  descending and right coronary arteries. Mediastinum/Nodes: No pathologically enlarged mediastinal or hilar lymph nodes. Please note that accurate exclusion of hilar adenopathy is limited on noncontrast CT scans. Esophagus is unremarkable in appearance. No axillary lymphadenopathy. Lungs/Pleura: Tiny pulmonary nodules are again noted measuring up to a volume derived mean diameter of 3.3 mm in the right upper lobe (axial image 132). No other larger more suspicious appearing pulmonary nodules or masses are noted. No acute consolidative airspace disease. No pleural effusions. Mild diffuse bronchial wall thickening with mild centrilobular and paraseptal emphysema. Upper Abdomen: Aortic atherosclerosis. Musculoskeletal: There are no aggressive appearing lytic or blastic lesions noted in the visualized portions of the skeleton. IMPRESSION: 1. Lung-RADS 2S, benign appearance or behavior. Continue annual screening with low-dose chest CT without contrast in 12 months. 2. The "S" modifier above refers to potentially clinically significant non lung cancer related findings. Specifically, there is aortic  atherosclerosis, in addition to left main and 2 vessel coronary artery disease. Please note that although the presence of coronary artery calcium documents the presence of coronary artery disease, the severity of this disease and any potential stenosis cannot be assessed on this non-gated CT examination. Assessment for potential risk factor modification, dietary therapy or pharmacologic therapy may be warranted, if clinically indicated. 3. Mild diffuse bronchial wall thickening with mild centrilobular and paraseptal emphysema; imaging findings suggestive of underlying COPD. Aortic Atherosclerosis (ICD10-I70.0) and Emphysema (ICD10-J43.9). Electronically Signed   By: Trudie Reed M.D.   On: 04/20/2023 07:13    Impression/Plan:    1) Head and Neck Cancer Status: NED  2) Nutritional Status: denies issues PEG tube: none   3) Risk Factors: The patient has been educated about risk factors including tobacco abuse; they understand that avoidance of smoking/tobacco is important to prevent recurrences as well as other cancers.    He is abstaining from any form of tobacco or vaping  4) Swallowing: good function  5)  Thyroid function:  WNL, continue this lab annually w/ PCP as RT can increase risk of hypothyroidism. I communicated with Shirline Frees to confirm. Lab Results  Component Value Date   TSH 1.089 04/20/2023   6) Other: followup with ENT (ok to push back appt to 6 mo from now given today's event).  He is doing well symptomatically with NED 4 years post diagnosis. I will see him back PRN.  He and his wife are pleased with this plan.   On date of service, in total, I spent 20 minutes on this encounter. Patient was seen in person. Note signed after encounter date; minutes pertain to date of service, only.  _____________________________________   Lonie Peak, MD

## 2023-04-22 ENCOUNTER — Encounter: Payer: Self-pay | Admitting: Radiation Oncology

## 2023-04-30 ENCOUNTER — Other Ambulatory Visit: Payer: Self-pay | Admitting: *Deleted

## 2023-04-30 DIAGNOSIS — Z122 Encounter for screening for malignant neoplasm of respiratory organs: Secondary | ICD-10-CM

## 2023-04-30 DIAGNOSIS — Z87891 Personal history of nicotine dependence: Secondary | ICD-10-CM

## 2023-05-04 ENCOUNTER — Other Ambulatory Visit: Payer: Self-pay | Admitting: Adult Health

## 2023-05-04 DIAGNOSIS — J40 Bronchitis, not specified as acute or chronic: Secondary | ICD-10-CM

## 2023-05-09 DIAGNOSIS — J449 Chronic obstructive pulmonary disease, unspecified: Secondary | ICD-10-CM | POA: Diagnosis not present

## 2023-05-09 DIAGNOSIS — E785 Hyperlipidemia, unspecified: Secondary | ICD-10-CM | POA: Diagnosis not present

## 2023-05-09 DIAGNOSIS — H269 Unspecified cataract: Secondary | ICD-10-CM | POA: Diagnosis not present

## 2023-05-09 DIAGNOSIS — K219 Gastro-esophageal reflux disease without esophagitis: Secondary | ICD-10-CM | POA: Diagnosis not present

## 2023-05-09 DIAGNOSIS — I10 Essential (primary) hypertension: Secondary | ICD-10-CM | POA: Diagnosis not present

## 2023-05-09 DIAGNOSIS — F325 Major depressive disorder, single episode, in full remission: Secondary | ICD-10-CM | POA: Diagnosis not present

## 2023-05-09 DIAGNOSIS — F419 Anxiety disorder, unspecified: Secondary | ICD-10-CM | POA: Diagnosis not present

## 2023-05-09 DIAGNOSIS — Z8249 Family history of ischemic heart disease and other diseases of the circulatory system: Secondary | ICD-10-CM | POA: Diagnosis not present

## 2023-05-09 DIAGNOSIS — M109 Gout, unspecified: Secondary | ICD-10-CM | POA: Diagnosis not present

## 2023-05-09 DIAGNOSIS — M1990 Unspecified osteoarthritis, unspecified site: Secondary | ICD-10-CM | POA: Diagnosis not present

## 2023-05-09 DIAGNOSIS — Z809 Family history of malignant neoplasm, unspecified: Secondary | ICD-10-CM | POA: Diagnosis not present

## 2023-05-09 DIAGNOSIS — Z87891 Personal history of nicotine dependence: Secondary | ICD-10-CM | POA: Diagnosis not present

## 2023-05-18 DIAGNOSIS — H25813 Combined forms of age-related cataract, bilateral: Secondary | ICD-10-CM | POA: Diagnosis not present

## 2023-05-18 DIAGNOSIS — H52203 Unspecified astigmatism, bilateral: Secondary | ICD-10-CM | POA: Diagnosis not present

## 2023-05-21 ENCOUNTER — Other Ambulatory Visit: Payer: Self-pay | Admitting: Adult Health

## 2023-05-21 DIAGNOSIS — I10 Essential (primary) hypertension: Secondary | ICD-10-CM

## 2023-06-25 ENCOUNTER — Other Ambulatory Visit: Payer: Self-pay | Admitting: Adult Health

## 2023-06-25 DIAGNOSIS — M1000 Idiopathic gout, unspecified site: Secondary | ICD-10-CM

## 2023-07-30 ENCOUNTER — Other Ambulatory Visit: Payer: Self-pay | Admitting: Adult Health

## 2023-07-30 DIAGNOSIS — F419 Anxiety disorder, unspecified: Secondary | ICD-10-CM

## 2023-07-31 NOTE — Telephone Encounter (Signed)
 Okay for refill?

## 2023-08-14 DIAGNOSIS — L814 Other melanin hyperpigmentation: Secondary | ICD-10-CM | POA: Diagnosis not present

## 2023-08-14 DIAGNOSIS — L821 Other seborrheic keratosis: Secondary | ICD-10-CM | POA: Diagnosis not present

## 2023-08-14 DIAGNOSIS — L570 Actinic keratosis: Secondary | ICD-10-CM | POA: Diagnosis not present

## 2023-08-14 DIAGNOSIS — D225 Melanocytic nevi of trunk: Secondary | ICD-10-CM | POA: Diagnosis not present

## 2023-08-20 ENCOUNTER — Other Ambulatory Visit: Payer: Self-pay | Admitting: Adult Health

## 2023-08-20 DIAGNOSIS — I10 Essential (primary) hypertension: Secondary | ICD-10-CM

## 2023-10-17 ENCOUNTER — Other Ambulatory Visit: Payer: Self-pay | Admitting: Adult Health

## 2023-10-17 NOTE — Telephone Encounter (Signed)
 Patient need to schedule CPE for more refills.

## 2023-10-26 ENCOUNTER — Other Ambulatory Visit: Payer: Self-pay | Admitting: Adult Health

## 2023-10-26 DIAGNOSIS — M1000 Idiopathic gout, unspecified site: Secondary | ICD-10-CM

## 2023-10-26 DIAGNOSIS — I10 Essential (primary) hypertension: Secondary | ICD-10-CM

## 2023-10-26 DIAGNOSIS — F419 Anxiety disorder, unspecified: Secondary | ICD-10-CM

## 2023-10-26 DIAGNOSIS — E785 Hyperlipidemia, unspecified: Secondary | ICD-10-CM

## 2023-10-26 NOTE — Telephone Encounter (Signed)
 Okay for refill?

## 2023-10-30 DIAGNOSIS — M19032 Primary osteoarthritis, left wrist: Secondary | ICD-10-CM | POA: Diagnosis not present

## 2023-10-30 DIAGNOSIS — M79642 Pain in left hand: Secondary | ICD-10-CM | POA: Diagnosis not present

## 2023-11-14 DIAGNOSIS — Z8521 Personal history of malignant neoplasm of larynx: Secondary | ICD-10-CM | POA: Diagnosis not present

## 2023-11-14 DIAGNOSIS — Z923 Personal history of irradiation: Secondary | ICD-10-CM | POA: Diagnosis not present

## 2023-11-14 DIAGNOSIS — J309 Allergic rhinitis, unspecified: Secondary | ICD-10-CM | POA: Diagnosis not present

## 2023-11-14 DIAGNOSIS — H903 Sensorineural hearing loss, bilateral: Secondary | ICD-10-CM | POA: Diagnosis not present

## 2023-11-14 DIAGNOSIS — Z08 Encounter for follow-up examination after completed treatment for malignant neoplasm: Secondary | ICD-10-CM | POA: Diagnosis not present

## 2023-11-20 ENCOUNTER — Other Ambulatory Visit: Payer: Self-pay | Admitting: Adult Health

## 2023-11-20 DIAGNOSIS — M1000 Idiopathic gout, unspecified site: Secondary | ICD-10-CM

## 2023-11-20 DIAGNOSIS — I10 Essential (primary) hypertension: Secondary | ICD-10-CM

## 2023-12-11 ENCOUNTER — Other Ambulatory Visit: Payer: Self-pay | Admitting: Adult Health

## 2023-12-11 DIAGNOSIS — I10 Essential (primary) hypertension: Secondary | ICD-10-CM

## 2023-12-19 ENCOUNTER — Ambulatory Visit (INDEPENDENT_AMBULATORY_CARE_PROVIDER_SITE_OTHER): Payer: Self-pay | Admitting: Adult Health

## 2023-12-19 ENCOUNTER — Encounter: Payer: Self-pay | Admitting: Adult Health

## 2023-12-19 VITALS — BP 138/80 | HR 86 | Temp 98.3°F | Ht 66.75 in | Wt 160.0 lb

## 2023-12-19 DIAGNOSIS — I10 Essential (primary) hypertension: Secondary | ICD-10-CM | POA: Diagnosis not present

## 2023-12-19 DIAGNOSIS — Z125 Encounter for screening for malignant neoplasm of prostate: Secondary | ICD-10-CM | POA: Diagnosis not present

## 2023-12-19 DIAGNOSIS — E785 Hyperlipidemia, unspecified: Secondary | ICD-10-CM

## 2023-12-19 DIAGNOSIS — Z0000 Encounter for general adult medical examination without abnormal findings: Secondary | ICD-10-CM | POA: Diagnosis not present

## 2023-12-19 DIAGNOSIS — M1000 Idiopathic gout, unspecified site: Secondary | ICD-10-CM | POA: Diagnosis not present

## 2023-12-19 DIAGNOSIS — F419 Anxiety disorder, unspecified: Secondary | ICD-10-CM

## 2023-12-19 DIAGNOSIS — C320 Malignant neoplasm of glottis: Secondary | ICD-10-CM | POA: Diagnosis not present

## 2023-12-19 LAB — LIPID PANEL
Cholesterol: 162 mg/dL (ref 0–200)
HDL: 87.8 mg/dL (ref >39.00)
LDL Cholesterol: 63 mg/dL (ref 0–99)
NonHDL: 73.88
Total CHOL/HDL Ratio: 2
Triglycerides: 53 mg/dL (ref 0.0–149.0)
VLDL: 10.6 mg/dL (ref 0.0–40.0)

## 2023-12-19 LAB — COMPREHENSIVE METABOLIC PANEL WITH GFR
ALT: 41 U/L (ref 0–53)
AST: 44 U/L — ABNORMAL HIGH (ref 0–37)
Albumin: 4.8 g/dL (ref 3.5–5.2)
Alkaline Phosphatase: 86 U/L (ref 39–117)
BUN: 9 mg/dL (ref 6–23)
CO2: 26 meq/L (ref 19–32)
Calcium: 9.6 mg/dL (ref 8.4–10.5)
Chloride: 91 meq/L — ABNORMAL LOW (ref 96–112)
Creatinine, Ser: 0.89 mg/dL (ref 0.40–1.50)
GFR: 89.37 mL/min (ref >60.00)
Glucose, Bld: 91 mg/dL (ref 70–99)
Potassium: 4.2 meq/L (ref 3.5–5.1)
Sodium: 127 meq/L — ABNORMAL LOW (ref 135–145)
Total Bilirubin: 0.5 mg/dL (ref 0.2–1.2)
Total Protein: 7.4 g/dL (ref 6.0–8.3)

## 2023-12-19 LAB — CBC
HCT: 41.6 % (ref 39.0–52.0)
Hemoglobin: 14 g/dL (ref 13.0–17.0)
MCHC: 33.7 g/dL (ref 30.0–36.0)
MCV: 90.1 fl (ref 78.0–100.0)
Platelets: 229 10*3/uL (ref 150.0–400.0)
RBC: 4.61 Mil/uL (ref 4.22–5.81)
RDW: 14.7 % (ref 11.5–15.5)
WBC: 5.3 10*3/uL (ref 4.0–10.5)

## 2023-12-19 LAB — PSA: PSA: 0.64 ng/mL (ref 0.10–4.00)

## 2023-12-19 LAB — TSH: TSH: 1.14 u[IU]/mL (ref 0.35–5.50)

## 2023-12-19 MED ORDER — SERTRALINE HCL 50 MG PO TABS: 50.0000 mg | ORAL_TABLET | Freq: Every day | ORAL | 1 refills | Status: DC

## 2023-12-19 MED ORDER — DILTIAZEM HCL ER COATED BEADS 360 MG PO CP24
360.0000 mg | ORAL_CAPSULE | Freq: Every day | ORAL | 3 refills | Status: AC
Start: 2023-12-19 — End: ?

## 2023-12-19 MED ORDER — ATORVASTATIN CALCIUM 80 MG PO TABS
80.0000 mg | ORAL_TABLET | Freq: Every day | ORAL | 3 refills | Status: AC
Start: 2023-12-19 — End: ?

## 2023-12-19 NOTE — Progress Notes (Signed)
 Subjective:    Patient ID: Aaron Black, male    DOB: [birth year], 68 y.o.   MRN: [id number]  HPI Patient presents for yearly preventative medicine examination. He is  pleasant 68 year old male who  has a past medical history of Anemia, Anxiety, Arm pain, Arthritis, B12 deficiency, Back pain, Cancer (HCC), Cellulitis of arm, left (10/2017), Cervical disc disease, Chest pain at rest (02/25/2013), Chronic lower back pain, Complication of anesthesia, Depression, Diverticulosis of colon, GERD (gastroesophageal reflux disease), Gout, Hyperlipidemia, Hypertension, Joint pain, Leg pain, Leg weakness, Neck pain, Pleurisy, Shortness of breath dyspnea, and Tobacco abuse.  History of glottis carcinoma-diagnosed in 2020.  He completed radiation treatment.  Currently doing well and has no complaints.  He follows up with oncology and ENT on a regular basis  Anxiety-well-controlled with Zoloft  50 mg daily and xanax   0.5 mg TID   Hypertension-takes Cozaar  100 mg, Norvasc  5 mg daily and Cardizem  360 mg ER daily.  Denies dizziness, lightheadedness, chest pain or shortness of breath.   BP Readings from Last 3 Encounters:  12/19/23 138/80  04/20/23 (!) 161/90  03/06/23 130/80   Hyperlipidemia - managed with Lipitor  80 mg daily. He denies myalgia or fatigue  Lab Results  Component Value Date   CHOL 165 01/27/2022   HDL 71.70 01/27/2022   LDLCALC 81 01/27/2022   LDLDIRECT 132.0 09/18/2018   TRIG 65.0 01/27/2022   CHOLHDL 2 01/27/2022   History of gout-controlled with allopurinol  100 mg daily.  He denies any acute gout flares    All immunizations and health maintenance protocols were reviewed with the patient and needed orders were placed.  Appropriate screening laboratory values were ordered for the patient including screening of hyperlipidemia, renal function and hepatic function. If indicated by BPH, a PSA was ordered.  Medication reconciliation,  past medical history, social history, problem list  and allergies were reviewed in detail with the patient  Goals were established with regard to weight loss, exercise, and  diet in compliance with medications Wt Readings from Last 3 Encounters:  12/19/23 160 lb (72.6 kg)  04/20/23 168 lb 2 oz (76.3 kg)  03/06/23 169 lb (76.7 kg)   Review of Systems  Constitutional: Negative.   HENT: Negative.    Eyes: Negative.   Respiratory: Negative.    Cardiovascular: Negative.   Gastrointestinal: Negative.   Endocrine: Negative.   Genitourinary: Negative.   Musculoskeletal: Negative.   Skin: Negative.   Allergic/Immunologic: Negative.   Neurological: Negative.   Hematological: Negative.   Psychiatric/Behavioral: Negative.    All other systems reviewed and are negative.  Past Medical History:  Diagnosis Date   Anemia    Anxiety    Arm pain    Arthritis    B12 deficiency    Back pain    Cancer (HCC)    throat cancer    Cellulitis of arm, left 10/2017   after cat scratch   Cervical disc disease    a. 07/2003 ant cervical deomcpression and fusion C5-6/C6-7;  b. 09/2007 post cervical laminectomy C4-5 with scres and arthrodesis C4-C7.   Chest pain at rest 02/25/2013   Chronic lower back pain    Complication of anesthesia    " i WOKE UP DURING A COLONOSCOPY "- 2008   Depression    Diverticulosis of colon    GERD (gastroesophageal reflux disease)    Gout    Hyperlipidemia    Hypertension    Joint pain    Leg pain  While walking   Leg weakness    Neck pain    Pleurisy    Shortness of breath dyspnea    with exertion- "I cant exercise now"   Tobacco abuse    a. 40 yrs, 1.5-3 ppd over that time.    Social History   Socioeconomic History   Marital status: Married    Spouse name: Not on file   Number of children: Not on file   Years of education: Not on file   Highest education level: Not on file  Occupational History   Not on file  Tobacco Use   Smoking status: Former    Current packs/day: 0.00    Average packs/day: 1  pack/day for 42.0 years (42.0 ttl pk-yrs)    Types: Cigarettes    Start date: 62    Quit date: 06/08/2019    Years since quitting: 4.5   Smokeless tobacco: Never   Tobacco comments:    He is smoking about 4-5 cigarettes, He had smoked up to 3 packs daily. 06/03/19  Vaping Use   Vaping status: Never Used  Substance and Sexual Activity   Alcohol use: Yes    Comment: occasional   Drug use: No   Sexual activity: Not on file  Other Topics Concern   Not on file  Social History Narrative   Lives in [place] with wife.  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   Social Drivers of Corporate investment banker Strain: Not on file  Food Insecurity: No Food Insecurity (04/20/2023)   Hunger Vital Sign    Worried About Running Out of Food in the Last Year: Never true    Ran Out of Food in the Last Year: Never true  Transportation Needs: No Transportation Needs (04/20/2023)   PRAPARE - Administrator, Civil Service (Medical): No    Lack of Transportation (Non-Medical): No  Physical Activity: Not on file  Stress: Not on file  Social Connections: Not on file  Intimate Partner Violence: Not At Risk (04/20/2023)   Humiliation, Afraid, Rape, and Kick questionnaire    Fear of Current or Ex-Partner: No    Emotionally Abused: No    Physically Abused: No    Sexually Abused: No    Past Surgical History:  Procedure Laterality Date   ABDOMINAL EXPOSURE N/A 01/03/2021   Procedure: ABDOMINAL EXPOSURE;  Surgeon: Young Hensen, MD;  Location: [facility] OR;  Service: Vascular;  Laterality: N/A;   ANTERIOR LAT LUMBAR FUSION Left 01/04/2021   Procedure: Removal Of Broken Hardware;  Surgeon: Manya Sells, MD;  Location: [facility] OR;  Service: Neurosurgery;  Laterality: Left;   ANTERIOR LATERAL LUMBAR FUSION 4 LEVELS Right 06/05/2014   Procedure: ANTERIOR LATERAL LUMBAR FUSION  LUMBAR ONE TO LUMBAR FIVE with Percutaneous Pedicle Screws;  Surgeon: Manya Sells, MD;  Location: [facility]  NEUR[facility] ORS;  Service: Neurosurgery;  Laterality: Right;   ANTERIOR LUMBAR FUSION N/A 01/03/2021   Procedure: Lumbar Four-Five Anterior lumbar interbody fusion with revision of graft;  Surgeon: Manya Sells, MD;  Location: [facility] OR;  Service: Neurosurgery;  Laterality: N/A;  Lumbar Four-Five Anterior lumbar interbody fusion with revision of graft   CARPAL TUNNEL RELEASE Right    CERVICAL DISC SURGERY     X 2   COLONOSCOPY     ELBOW ARTHROSCOPY Right    KNEE ARTHROSCOPY Right 08/2013   torn menicus   KNEE ARTHROSCOPY Left 11/2013   chip cartlidge,torn menicus   LUMBAR  LAMINECTOMY/DECOMPRESSION MICRODISCECTOMY Right 07/11/2013   Procedure: LUMBAR LAMINECTOMY/DECOMPRESSION MICRODISCECTOMY 1 LEVEL;  Surgeon: Manya Sells, MD;  Location: [facility]UR[facility] OR[facility];  Service: Neurosurgery;  Laterality: Right;  Right L34 microdiskectomy   LUMBAR PERCUTANEOUS PEDICLE SCREW 4 LEVEL N/A 06/05/2014   Procedure: LUMBAR PERCUTANEOUS PEDICLE SCREW 4 LEVEL;  Surgeon: Manya Sells, MD;  Location: [facility] OR[facility];  Service: Neurosurgery;  Laterality: N/A;   MICROLARYNGOSCOPY Left 05/12/2019   Procedure: MICROLARYNGOSCOPY WITH BIOPSY;  Surgeon: Prescott Brodie, MD;  Location: [facility];  Service: ENT;  Laterality: Left;   NECK SURGERY  January 2005, March 2009   C-Spine   POLYPECTOMY     ROTATOR CUFF REPAIR  August 2000, January 2002, July 2008, July 2009   2 left 2 right   TOTAL HIP ARTHROPLASTY Left 09/21/2021   Procedure: TOTAL HIP ARTHROPLASTY ANTERIOR APPROACH;  Surgeon: Liliane Rei, MD;  Location: [facility] ORS;  Service: Orthopedics;  Laterality: Left;    Family History  Problem Relation Age of Onset   Bone cancer Mother    Squamous cell carcinoma Mother        died @ 24   Heart attack Mother    Hypertension Father    Diabetes Father        borderline   Congestive Heart Failure Father        alive @ 31   Heart failure Father    Sudden death Brother        died @ 55   Other Brother        died  in MVA @ 65 - struck by drunk driver   Other Brother        died in MVA @ 24 - struck by drunk driver   Heart attack Brother    Stroke Paternal Grandfather    Colon cancer Neg Hx    Esophageal cancer Neg Hx    Rectal cancer Neg Hx    Stomach cancer Neg Hx    Colon polyps Neg Hx     Allergies  Allergen Reactions   Lisinopril  Swelling    Swollen Lip   Aspirin  Other (See Comments)   Oxycodone  Hcl Other (See Comments)   Oxycodone -Aspirin  Other (See Comments) and Rash    Percodan caused rash---but he can tolerate Percocet and aspirin  on their own   Percodan [Oxycodone -Aspirin ] Rash    Percodan caused rash---but he can tolerate Percocet and aspirin  on their own     Current Outpatient Medications on File Prior to Visit  Medication Sig Dispense Refill   albuterol  (VENTOLIN  HFA) 108 (90 Base) MCG/ACT inhaler TAKE 2 PUFFS BY MOUTH EVERY 6 HOURS AS NEEDED FOR WHEEZE OR SHORTNESS OF BREATH 8.5 each 2   allopurinol  (ZYLOPRIM ) 100 MG tablet TAKE 1 TABLET (100 MG TOTAL) BY MOUTH IN THE MORNING 90 tablet 1   ALPRAZolam  (XANAX ) 0.5 MG tablet TAKE 1 TABLET BY MOUTH THREE TIMES A DAY 90 tablet 2   amLODipine  (NORVASC ) 5 MG tablet TAKE 1 TABLET (5 MG TOTAL) BY MOUTH DAILY. 90 tablet 1   atorvastatin  (LIPITOR ) 80 MG tablet TAKE 1 TABLET BY MOUTH EVERY DAY 30 tablet 0   diltiazem  (CARDIZEM  CD) 360 MG 24 hr capsule TAKE 1 CAPSULE BY MOUTH EVERY DAY 90 capsule 3   Fluticasone-Umeclidin-Vilant (TRELEGY ELLIPTA ) 100-62.5-25 MCG/ACT AEPB Inhale 1 puff into the lungs daily. 3 each 3   gabapentin  (NEURONTIN ) 300 MG capsule gabapentin  300 mg capsule TAKE 1 CAPSULE BY MOUTH FOUR TIMES A DAY  losartan  (COZAAR ) 100 MG tablet TAKE 1 TABLET BY MOUTH EVERY DAY 90 tablet 1   omeprazole  (PRILOSEC ) 40 MG capsule TAKE 1 CAPSULE (40 MG TOTAL) BY MOUTH DAILY. 90 capsule 1   sertraline  (ZOLOFT ) 50 MG tablet TAKE 1 TABLET BY MOUTH EVERY DAY 30 tablet 0   vitamin B-12 (CYANOCOBALAMIN ) 1000 MCG tablet Take 1,000 mcg by  mouth in the morning.     No current facility-administered medications on file prior to visit.    BP 138/80   Pulse 86   Temp 98.3 F (36.8 C) (Oral)   Ht 5' 6.75" (1.695 m)   Wt 160 lb (72.6 kg)   SpO2 98%   BMI 25.25 kg/m       Objective:   Physical Exam Vitals and nursing note reviewed.  Constitutional:      General: He is not in acute distress.    Appearance: Normal appearance. He is not ill-appearing.  HENT:     Head: Normocephalic and atraumatic.     Right Ear: Tympanic membrane, ear canal and external ear normal. There is no impacted cerumen.     Left Ear: Tympanic membrane, ear canal and external ear normal. There is no impacted cerumen.     Nose: Nose normal. No congestion or rhinorrhea.     Mouth/Throat:     Mouth: Mucous membranes are moist.     Pharynx: Oropharynx is clear.  Eyes:     Extraocular Movements: Extraocular movements intact.     Conjunctiva/sclera: Conjunctivae normal.     Pupils: Pupils are equal, round, and reactive to light.  Neck:     Vascular: No carotid bruit.  Cardiovascular:     Rate and Rhythm: Normal rate and regular rhythm.     Pulses: Normal pulses.     Heart sounds: No murmur heard.    No friction rub. No gallop.  Pulmonary:     Effort: Pulmonary effort is normal.     Breath sounds: Normal breath sounds.  Abdominal:     General: Abdomen is flat. Bowel sounds are normal. There is no distension.     Palpations: Abdomen is soft. There is no mass.     Tenderness: There is no abdominal tenderness. There is no guarding or rebound.     Hernia: No hernia is present.  Musculoskeletal:        General: Normal range of motion.     Cervical back: Normal range of motion and neck supple.  Lymphadenopathy:     Cervical: No cervical adenopathy.  Skin:    General: Skin is warm and dry.     Capillary Refill: Capillary refill takes less than 2 seconds.  Neurological:     General: No focal deficit present.     Mental Status: He is alert and  oriented to person, place, and time.  Psychiatric:        Mood and Affect: Mood normal.        Behavior: Behavior normal.        Thought Content: Thought content normal.        Judgment: Judgment normal.        Assessment & Plan:  1. Routine general medical examination at a health care facility (Primary) Today patient counseled on age appropriate routine health concerns for screening and prevention, each reviewed and up to date or declined. Immunizations reviewed and up to date or declined. Labs ordered and reviewed. Risk factors for depression reviewed and negative. Hearing function and visual acuity are intact.  ADLs screened and addressed as needed. Functional ability and level of safety reviewed and appropriate. Education, counseling and referrals performed based on assessed risks today. Patient provided with a copy of personalized plan for preventive services. - Refused vaccinations - Stay active and eat health y - Follow up in one year or sooner if needed  2. Glottis carcinoma (HCC) - Doing well. Has no concerns. Follow up with ENT as directed  - Lipid panel; Future - TSH; Future - CBC; Future - Comprehensive metabolic panel with GFR; Future  3. Anxiety disorder, unspecified type - Continue with Zoloft  daily and Xanax  PRN  - Lipid panel; Future - TSH; Future - CBC; Future - Comprehensive metabolic panel with GFR; Future - sertraline  (ZOLOFT ) 50 MG tablet; Take 1 tablet (50 mg total) by mouth daily.  Dispense: 90 tablet; Refill: 1  4. Essential hypertension - Well controlled. No change in medication  - Lipid panel; Future - TSH; Future - CBC; Future - Comprehensive metabolic panel with GFR; Future - diltiazem  (CARDIZEM  CD) 360 MG 24 hr capsule; Take 1 capsule (360 mg total) by mouth daily.  Dispense: 90 capsule; Refill: 3  5. Hyperlipidemia, unspecified hyperlipidemia type - Continue with Lipitor  80 mg daily  - Lipid panel; Future - TSH; Future - CBC; Future -  Comprehensive metabolic panel with GFR; Future - atorvastatin  (LIPITOR ) 80 MG tablet; Take 1 tablet (80 mg total) by mouth daily.  Dispense: 90 tablet; Refill: 3  6. Idiopathic gout, unspecified chronicity, unspecified site - Continue with Allopurinol   - Lipid panel; Future - TSH; Future - CBC; Future - Comprehensive metabolic panel with GFR; Future  7. Prostate cancer screening  - PSA; Future  Alto Atta, NP

## 2023-12-19 NOTE — Patient Instructions (Signed)
 It was great seeing you today   We will follow up with you regarding your lab work   Please let me know if you need anything

## 2023-12-20 ENCOUNTER — Ambulatory Visit: Payer: Self-pay | Admitting: Adult Health

## 2024-01-16 ENCOUNTER — Telehealth: Payer: Self-pay | Admitting: Adult Health

## 2024-01-16 ENCOUNTER — Ambulatory Visit (INDEPENDENT_AMBULATORY_CARE_PROVIDER_SITE_OTHER)

## 2024-01-16 VITALS — BP 120/60 | HR 87 | Temp 98.3°F | Ht 66.75 in | Wt 159.9 lb

## 2024-01-16 DIAGNOSIS — Z0000 Encounter for general adult medical examination without abnormal findings: Secondary | ICD-10-CM | POA: Diagnosis not present

## 2024-01-16 NOTE — Patient Instructions (Addendum)
 Mr. Aaron Black , Thank you for taking time out of your busy schedule to complete your Annual Wellness Visit with me. I enjoyed our conversation and look forward to speaking with you again next year. I, as well as your care team,  appreciate your ongoing commitment to your health goals. Please review the following plan we discussed and let me know if I can assist you in the future. Your Game plan/ To Do List    Referrals: If you haven't heard from the office you've been referred to, please reach out to them at the phone provided.    Follow up Visits: Next Medicare AWV with our clinical staff: 01/21/25 @ 10:40a   Have you seen your provider in the last 6 months (3 months if uncontrolled diabetes)? 12/17/23 Next Office Visit with your provider:   Clinician Recommendations:  Aim for 30 minutes of exercise or brisk walking, 6-8 glasses of water , and 5 servings of fruits and vegetables each day.        This is a list of the screening recommended for you and due dates:  Health Maintenance  Topic Date Due   COVID-19 Vaccine (4 - 2024-25 season) 03/25/2023   Zoster (Shingles) Vaccine (1 of 2) 03/20/2024*   Pneumococcal Vaccine for age over 64 (1 of 1 - PCV) 12/18/2024*   Flu Shot  02/22/2024   Screening for Lung Cancer  04/15/2024   Medicare Annual Wellness Visit  01/15/2025   Colon Cancer Screening  04/26/2025   DTaP/Tdap/Td vaccine (3 - Td or Tdap) 10/24/2032   Hepatitis C Screening  Completed   Hepatitis B Vaccine  Aged Out   HPV Vaccine  Aged Out   Meningitis B Vaccine  Aged Out  *Topic was postponed. The date shown is not the original due date.    Advanced directives: (Declined) Advance directive discussed with you today. Even though you declined this today, please call our office should you change your mind, and we can give you the proper paperwork for you to fill out. Advance Care Planning is important because it:  [x]  Makes sure you receive the medical care that is consistent with your  values, goals, and preferences  [x]  It provides guidance to your family and loved ones and reduces their decisional burden about whether or not they are making the right decisions based on your wishes.  Follow the link provided in your after visit summary or read over the paperwork we have mailed to you to help you started getting your Advance Directives in place. If you need assistance in completing these, please reach out to us  so that we can help you!  See attachments for Preventive Care and Fall Prevention Tips.

## 2024-01-16 NOTE — Progress Notes (Signed)
 Subjective:   Aaron Black is a 68 y.o. who presents for a Medicare Wellness preventive visit.  As a reminder, Annual Wellness Visits don't include a physical exam, and some assessments may be limited, especially if this visit is performed virtually. We may recommend an in-person follow-up visit with your provider if needed.  Visit Complete: In person    Persons Participating in Visit: Patient.  AWV Questionnaire: No: Patient Medicare AWV questionnaire was not completed prior to this visit.  Cardiac Risk Factors include: advanced age (>67men, >20 women);hypertension     Objective:    Today's Vitals   01/16/24 1039  BP: 120/60  Pulse: 87  Temp: 98.3 F (36.8 C)  TempSrc: Oral  SpO2: 97%  Weight: 159 lb 14.4 oz (72.5 kg)  Height: 5' 6.75 (1.695 m)   Body mass index is 25.23 kg/m.     01/16/2024   10:54 AM 04/20/2023    9:51 AM 04/21/2022   11:23 AM 09/21/2021    6:58 PM 09/21/2021    5:15 PM 09/08/2021    9:18 AM 04/15/2021   11:28 AM  Advanced Directives  Does Patient Have a Medical Advance Directive? No No No  No No No  Does patient want to make changes to medical advance directive?     No - Patient declined    Would patient like information on creating a medical advance directive? No - Patient declined No - Patient declined Yes (ED - Information included in AVS) No - Patient declined  No - Patient declined No - Patient declined    Current Medications (verified) Outpatient Encounter Medications as of 01/16/2024  Medication Sig   albuterol  (VENTOLIN  HFA) 108 (90 Base) MCG/ACT inhaler TAKE 2 PUFFS BY MOUTH EVERY 6 HOURS AS NEEDED FOR WHEEZE OR SHORTNESS OF BREATH   allopurinol  (ZYLOPRIM ) 100 MG tablet TAKE 1 TABLET (100 MG TOTAL) BY MOUTH IN THE MORNING   ALPRAZolam  (XANAX ) 0.5 MG tablet TAKE 1 TABLET BY MOUTH THREE TIMES A DAY   amLODipine  (NORVASC ) 5 MG tablet TAKE 1 TABLET (5 MG TOTAL) BY MOUTH DAILY.   atorvastatin  (LIPITOR ) 80 MG tablet Take 1 tablet (80 mg  total) by mouth daily.   diltiazem  (CARDIZEM  CD) 360 MG 24 hr capsule Take 1 capsule (360 mg total) by mouth daily.   Fluticasone-Umeclidin-Vilant (TRELEGY ELLIPTA ) 100-62.5-25 MCG/ACT AEPB Inhale 1 puff into the lungs daily.   gabapentin  (NEURONTIN ) 300 MG capsule gabapentin  300 mg capsule TAKE 1 CAPSULE BY MOUTH FOUR TIMES A DAY   losartan  (COZAAR ) 100 MG tablet TAKE 1 TABLET BY MOUTH EVERY DAY   omeprazole  (PRILOSEC ) 40 MG capsule TAKE 1 CAPSULE (40 MG TOTAL) BY MOUTH DAILY.   sertraline  (ZOLOFT ) 50 MG tablet Take 1 tablet (50 mg total) by mouth daily.   vitamin B-12 (CYANOCOBALAMIN ) 1000 MCG tablet Take 1,000 mcg by mouth in the morning.   No facility-administered encounter medications on file as of 01/16/2024.    Allergies (verified) Lisinopril , Aspirin , Oxycodone  hcl, Oxycodone -aspirin , and Percodan [oxycodone -aspirin ]   History: Past Medical History:  Diagnosis Date   Anemia    Anxiety    Arm pain    Arthritis    B12 deficiency    Back pain    Cancer (HCC)    throat cancer    Cellulitis of arm, left 10/2017   after cat scratch   Cervical disc disease    a. 07/2003 ant cervical deomcpression and fusion C5-6/C6-7;  b. 09/2007 post cervical laminectomy C4-5 with scres  and arthrodesis C4-C7.   Chest pain at rest 02/25/2013   Chronic lower back pain    Complication of anesthesia     i WOKE UP DURING A COLONOSCOPY - 2008   Depression    Diverticulosis of colon    GERD (gastroesophageal reflux disease)    Gout    Hyperlipidemia    Hypertension    Joint pain    Leg pain    While walking   Leg weakness    Neck pain    Pleurisy    Shortness of breath dyspnea    with exertion- I cant exercise now   Tobacco abuse    a. 40 yrs, 1.5-3 ppd over that time.   Past Surgical History:  Procedure Laterality Date   ABDOMINAL EXPOSURE N/A 01/03/2021   Procedure: ABDOMINAL EXPOSURE;  Surgeon: Gretta Lonni PARAS, MD;  Location[facility] OR;  Service: Vascular;  Laterality: N/A;    ANTERIOR LAT LUMBAR FUSION Left 01/04/2021   Procedure: Removal Of Broken Hardware;  Surgeon: Unice Pac, MD;  Location: [facility] OR;  Service: Neurosurgery;  Laterality: Left;   ANTERIOR LATERAL LUMBAR FUSION 4 LEVELS Right 06/05/2014   Procedure: ANTERIOR LATERAL LUMBAR FUSION  LUMBAR ONE TO LUMBAR FIVE with Percutaneous Pedicle Screws;  Surgeon: Pac Unice, MD;  Location: [facility];  Service: Neurosurgery;  Laterality: Right;   ANTERIOR LUMBAR FUSION N/A 01/03/2021   Procedure: Lumbar Four-Five Anterior lumbar interbody fusion with revision of graft;  Surgeon: Unice Pac, MD;  Location: [facility] OR;  Service: Neurosurgery;  Laterality: N/A;  Lumbar Four-Five Anterior lumbar interbody fusion with revision of graft   CARPAL TUNNEL RELEASE Right    CERVICAL DISC SURGERY     X 2   COLONOSCOPY     ELBOW ARTHROSCOPY Right    KNEE ARTHROSCOPY Right 08/2013   torn menicus   KNEE ARTHROSCOPY Left 11/2013   chip cartlidge,torn menicus   LUMBAR LAMINECTOMY/DECOMPRESSION MICRODISCECTOMY Right 07/11/2013   Procedure: LUMBAR LAMINECTOMY/DECOMPRESSION MICRODISCECTOMY 1 LEVEL;  Surgeon: Pac Unice, MD;  Location: [facility];  Service: Neurosurgery;  Laterality: Right;  Right L34 microdiskectomy   LUMBAR PERCUTANEOUS PEDICLE SCREW 4 LEVEL N/A 06/05/2014   Procedure: LUMBAR PERCUTANEOUS PEDICLE SCREW 4 LEVEL;  Surgeon: Pac Unice, MD;  Location: [facility];  Service: Neurosurgery;  Laterality: N/A;   MICROLARYNGOSCOPY Left 05/12/2019   Procedure: MICROLARYNGOSCOPY WITH BIOPSY;  Surgeon: Ethyl Lonni BRAVO, MD;  Location: [facility];  Service: ENT;  Laterality: Left;   NECK SURGERY  January 2005, March 2009   C-Spine   POLYPECTOMY     ROTATOR CUFF REPAIR  August 2000, January 2002, July 2008, July 2009   2 left 2 right   TOTAL HIP ARTHROPLASTY Left 09/21/2021   Procedure: TOTAL HIP ARTHROPLASTY ANTERIOR APPROACH;  Surgeon: Melodi Lerner, MD;  Location: [facility] ORS;  Service: Orthopedics;   Laterality: Left;   Family History  Problem Relation Age of Onset   Bone cancer Mother    Squamous cell carcinoma Mother        died @ 41   Heart attack Mother    Hypertension Father    Diabetes Father        borderline   Congestive Heart Failure Father        alive @ 62   Heart failure Father    Sudden death Brother        died @ 72   Other Brother        died in MVA @  31 - struck by drunk driver   Other Brother        died in MVA @ 24 - struck by drunk driver   Heart attack Brother    Stroke Paternal Grandfather    Colon cancer Neg Hx    Esophageal cancer Neg Hx    Rectal cancer Neg Hx    Stomach cancer Neg Hx    Colon polyps Neg Hx    Social History   Socioeconomic History   Marital status: Married    Spouse name: Not on file   Number of children: Not on file   Years of education: Not on file   Highest education level: Not on file  Occupational History   Not on file  Tobacco Use   Smoking status: Former    Current packs/day: 0.00    Average packs/day: 1 pack/day for 42.0 years (42.0 ttl pk-yrs)    Types: Cigarettes    Start date: 34    Quit date: 06/08/2019    Years since quitting: 4.6   Smokeless tobacco: Never   Tobacco comments:    He is smoking about 4-5 cigarettes, He had smoked up to 3 packs daily. 06/03/19  Vaping Use   Vaping status: Never Used  Substance and Sexual Activity   Alcohol use: Yes    Comment: occasional   Drug use: No   Sexual activity: Not on file  Other Topics Concern   Not on file  Social History Narrative   Lives in [place] with wife.  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   Social Drivers of Corporate investment banker Strain: Low Risk  (01/16/2024)   Overall Financial Resource Strain (CARDIA)    Difficulty of Paying Living Expenses: Not hard at all  Food Insecurity: No Food Insecurity (01/16/2024)   Hunger Vital Sign    Worried About Running Out of Food in the Last Year: Never true    Ran Out  of Food in the Last Year: Never true  Transportation Needs: No Transportation Needs (01/16/2024)   PRAPARE - Administrator, Civil Service (Medical): No    Lack of Transportation (Non-Medical): No  Physical Activity: Sufficiently Active (01/16/2024)   Exercise Vital Sign    Days of Exercise per Week: 6 days    Minutes of Exercise per Session: 30 min  Stress: No Stress Concern Present (01/16/2024)   [employer] of Occupational Health - Occupational Stress Questionnaire    Feeling of Stress: Not at all  Social Connections: Moderately Isolated (01/16/2024)   Social Connection and Isolation Panel    Frequency of Communication with Friends and Family: More than three times a week    Frequency of Social Gatherings with Friends and Family: More than three times a week    Attends Religious Services: Never    Database administrator or Organizations: No    Attends Banker Meetings: Never    Marital Status: Married    Tobacco Counseling Counseling given: Not Answered Tobacco comments: He is smoking about 4-5 cigarettes, He had smoked up to 3 packs daily. 06/03/19    Clinical Intake:  Pre-visit preparation completed: Yes  Pain : No/denies pain     BMI - recorded: 25.23 Nutritional Status: BMI 25 -29 Overweight Nutritional Risks: None Diabetes: No  Lab Results  Component Value Date   HGBA1C 5.8 01/27/2022     How often do you need to have someone help you when  you read instructions, pamphlets, or other written materials from your doctor or pharmacy?: 1 - Never  Interpreter Needed?: No  Information entered by :: Rojelio Blush LPN   Activities of Daily Living     01/16/2024   10:53 AM 12/19/2023    7:59 AM  In your present state of health, do you have any difficulty performing the following activities:  Hearing? 0 1  Vision? 0 1  Difficulty concentrating or making decisions? 0 0  Walking or climbing stairs? 0 0  Dressing or bathing? 0 0  Doing  errands, shopping? 0 0  Preparing Food and eating ? N   Using the Toilet? N   In the past six months, have you accidently leaked urine? N   Do you have problems with loss of bowel control? N   Managing your Medications? N   Managing your Finances? N   Housekeeping or managing your Housekeeping? N     Patient Care Team: Merna Huxley, NP as PCP - General (Family Medicine) Ethyl Lonni BRAVO, MD (Inactive) as Consulting Physician (Otolaryngology) Izell Domino, MD as Attending Physician (Radiation Oncology) Alray Charlie LABOR, RN (Inactive) as Oncology Nurse Navigator Schinke, Lupita NOVAK, CCC-SLP as Speech Language Pathologist (Speech Pathology) Daryle Heron CROME, RD as Dietitian (Nutrition)  I have updated your Care Teams any recent Medical Services you may have received from other providers in the past year.     Assessment:   This is a routine wellness examination for Aaron Black.  Hearing/Vision screen Hearing Screening - Comments:: Denies hearing difficulties   Vision Screening - Comments:: Wears rx glasses - up to date with routine [facility] exams with  [facility]   Goals Addressed               This Visit's Progress     Increase physical activity (pt-stated)        Remain active.       Depression Screen     01/16/2024   10:39 AM 12/19/2023    7:58 AM 01/27/2022    7:03 AM 10/17/2021   11:09 AM 12/02/2020    7:07 AM 11/11/2019    7:20 AM 06/30/2016   10:33 AM  PHQ 2/9 Scores  PHQ - 2 Score 0 0 6 1 1 1  0  PHQ- 9 Score   14  3      Fall Risk     01/16/2024   10:53 AM 09/28/2022    2:23 PM 12/02/2020    7:06 AM  Fall Risk   Falls in the past year? 0 0 1  Number falls in past yr: 0 0 0  Injury with Fall? 0 0 1  Risk for fall due to : No Fall Risks No Fall Risks   Follow up Falls evaluation completed Falls evaluation completed     MEDICARE RISK AT HOME:  Medicare Risk at Home Any stairs in or around the home?: No If so, are there any without handrails?: No Home free  of loose throw rugs in walkways, pet beds, electrical cords, etc?: Yes Adequate lighting in your home to reduce risk of falls?: Yes Life alert?: No Use of a cane, walker or w/c?: No Grab bars in the bathroom?: No Shower chair or bench in shower?: No Elevated toilet seat or a handicapped toilet?: No  TIMED UP AND GO:  Was the test performed?  Yes  Length of time to ambulate 10 feet: 10 sec Gait steady and fast without use of assistive device  Cognitive  Function: 6CIT completed        01/16/2024   10:54 AM  6CIT Screen  What Year? 0 points  What month? 0 points  What time? 0 points  Count back from 20 0 points  Months in reverse 0 points  Repeat phrase 0 points  Total Score 0 points    Immunizations Immunization History  Administered Date(s) Administered   Fluad Quad(high Dose 65+) 05/22/2019   Influenza Inj Mdck Quad Pf 05/12/2022   Influenza Split 04/10/2011, 05/14/2012   Influenza Whole 04/11/2010   Influenza, High Dose Seasonal PF 05/16/2023   Influenza,inj,Quad PF,6+ Mos 06/04/2013, 06/30/2016, 05/24/2017, 05/21/2018, 07/03/2019, 06/14/2020, 05/13/2021   Influenza-Unspecified 04/17/2014   PFIZER(Purple Top)SARS-COV-2 Vaccination 11/07/2019, 12/01/2019, 03/24/2020   Tdap 09/25/2011, 10/25/2022    Screening Tests Health Maintenance  Topic Date Due   COVID-19 Vaccine (4 - 2024-25 season) 03/25/2023   Zoster Vaccines- Shingrix (1 of 2) 03/20/2024 (Originally 07/26/1974)   Pneumococcal Vaccine: 50+ Years (1 of 1 - PCV) 12/18/2024 (Originally 07/27/2007)   INFLUENZA VACCINE  02/22/2024   Lung Cancer Screening  04/15/2024   Medicare Annual Wellness (AWV)  01/15/2025   Colonoscopy  04/26/2025   DTaP/Tdap/Td (3 - Td or Tdap) 10/24/2032   Hepatitis C Screening  Completed   Hepatitis B Vaccines  Aged Out   HPV VACCINES  Aged Out   Meningococcal B Vaccine  Aged Out    Health Maintenance  Health Maintenance Due  Topic Date Due   COVID-19 Vaccine (4 - 2024-25 season)  03/25/2023   Health Maintenance Items Addressed:   Additional Screening:  Vision Screening: Recommended annual ophthalmology exams for early detection of glaucoma and other disorders of the eye. Would you like a referral to an eye doctor? No    Dental Screening: Recommended annual dental exams for proper oral hygiene  Community Resource Referral / Chronic Care Management: CRR required this visit?  No   CCM required this visit?  No   Plan:    I have personally reviewed and noted the following in the patient's chart:   Medical and social history Use of alcohol, tobacco or illicit drugs  Current medications and supplements including opioid prescriptions. Patient is not currently taking opioid prescriptions. Functional ability and status Nutritional status Physical activity Advanced directives List of other physicians Hospitalizations, surgeries, and ER visits in previous 12 months Vitals Screenings to include cognitive, depression, and falls Referrals and appointments  In addition, I have reviewed and discussed with patient certain preventive protocols, quality metrics, and best practice recommendations. A written personalized care plan for preventive services as well as general preventive health recommendations were provided to patient.   Rojelio LELON Blush, LPN   [id number]/7974   After Visit Summary: (In Person-Printed) AVS printed and given to the patient  Notes: Nothing significant to report at this time.

## 2024-01-16 NOTE — Telephone Encounter (Signed)
 Patient left form for handicapped placard. Form in folder

## 2024-01-16 NOTE — Telephone Encounter (Signed)
 Noted

## 2024-01-17 ENCOUNTER — Ambulatory Visit: Payer: Self-pay

## 2024-01-17 NOTE — Telephone Encounter (Signed)
 Noted

## 2024-01-17 NOTE — Telephone Encounter (Signed)
 Copied from CRM [id number]. Topic: Clinical - Red Word Triage >> Jan 17, 2024  1:10 PM Aaron Black GRADE wrote: Red Word that prompted transfer to Nurse Triage: Right hand tick bite, area had become swollen, red and extremely itchy.   FYI Only or Action Required?: FYI only for provider.  Patient was last seen in primary care on 12/19/2023 by Merna Huxley, NP. Called Nurse Triage reporting Insect Bite. Symptoms began 2 days ago.Symptoms are: gradually worsening.  Triage Disposition: See Physician Within 24 Hours  Patient/caregiver understands and will follow disposition?: Yes   Reason for Disposition  [1] Red or very tender (to touch) area AND [2] getting larger over 48 hours after the bite  Answer Assessment - Initial Assessment Questions 1. TYPE of INSECT: What type of insect was it?      Believes it was a spider or a tick  2. ONSET: When did you get bitten?      2 days ago  3. LOCATION: Where is the insect bite located?      Right hand  4. REDNESS: Is the area red or pink? If Yes, ask: What size is area of redness? (inches or cm). When did the redness start?     Yes 5. PAIN: Is there any pain? If Yes, ask: How bad is it?  (Scale 1-10; or mild, moderate, severe)     No 6. ITCHING: Does it itch? If Yes, ask: How bad is the itch?    - MILD: doesn't interfere with normal activities   - MODERATE-SEVERE: interferes with work, school, sleep, or other activities      Mild 7. SWELLING: How big is the swelling? (inches, cm, or compare to coins)     Yes 8. OTHER SYMPTOMS: Do you have any other symptoms?  (e.g., difficulty breathing, hives)     No  Protocols used: Insect Bite-A-AH

## 2024-01-18 ENCOUNTER — Ambulatory Visit (INDEPENDENT_AMBULATORY_CARE_PROVIDER_SITE_OTHER): Admitting: Adult Health

## 2024-01-18 ENCOUNTER — Encounter: Payer: Self-pay | Admitting: Adult Health

## 2024-01-18 VITALS — BP 138/90 | HR 93 | Temp 98.7°F | Ht 66.75 in | Wt 161.0 lb

## 2024-01-18 DIAGNOSIS — W57XXXA Bitten or stung by nonvenomous insect and other nonvenomous arthropods, initial encounter: Secondary | ICD-10-CM | POA: Diagnosis not present

## 2024-01-18 DIAGNOSIS — S60861A Insect bite (nonvenomous) of right wrist, initial encounter: Secondary | ICD-10-CM | POA: Diagnosis not present

## 2024-01-18 MED ORDER — DOXYCYCLINE HYCLATE 100 MG PO CAPS
100.0000 mg | ORAL_CAPSULE | Freq: Two times a day (BID) | ORAL | 0 refills | Status: AC
Start: 2024-01-18 — End: ?

## 2024-01-18 NOTE — Progress Notes (Signed)
 Subjective:    Patient ID: Aaron Black, male    DOB: [birth year], 68 y.o.   MRN: [id number]  HPI 68 year old male who  has a past medical history of Anemia, Anxiety, Arm pain, Arthritis, B12 deficiency, Back pain, Cancer (HCC), Cellulitis of arm, left (10/2017), Cervical disc disease, Chest pain at rest (02/25/2013), Chronic lower back pain, Complication of anesthesia, Depression, Diverticulosis of colon, GERD (gastroesophageal reflux disease), Gout, Hyperlipidemia, Hypertension, Joint pain, Leg pain, Leg weakness, Neck pain, Pleurisy, Shortness of breath dyspnea, and Tobacco abuse.  He presents to the office today for an acute issue. He believes he was bitten by an insect, either a tick or a spider on his right wrist.  He has redness, swelling and the hand is itchy.    Review of Systems See HPI   Past Medical History:  Diagnosis Date   Anemia    Anxiety    Arm pain    Arthritis    B12 deficiency    Back pain    Cancer (HCC)    throat cancer    Cellulitis of arm, left 10/2017   after cat scratch   Cervical disc disease    a. 07/2003 ant cervical deomcpression and fusion C5-6/C6-7;  b. 09/2007 post cervical laminectomy C4-5 with scres and arthrodesis C4-C7.   Chest pain at rest 02/25/2013   Chronic lower back pain    Complication of anesthesia     i WOKE UP DURING A COLONOSCOPY - 2008   Depression    Diverticulosis of colon    GERD (gastroesophageal reflux disease)    Gout    Hyperlipidemia    Hypertension    Joint pain    Leg pain    While walking   Leg weakness    Neck pain    Pleurisy    Shortness of breath dyspnea    with exertion- I cant exercise now   Tobacco abuse    a. 40 yrs, 1.5-3 ppd over that time.    Social History   Socioeconomic History   Marital status: Married    Spouse name: Not on file   Number of children: Not on file   Years of education: Not on file   Highest education level: Not on file  Occupational History   Not on file  Tobacco  Use   Smoking status: Former    Current packs/day: 0.00    Average packs/day: 1 pack/day for 42.0 years (42.0 ttl pk-yrs)    Types: Cigarettes    Start date: 1    Quit date: 06/08/2019    Years since quitting: 4.6   Smokeless tobacco: Never   Tobacco comments:    He is smoking about 4-5 cigarettes, He had smoked up to 3 packs daily. 06/03/19  Vaping Use   Vaping status: Never Used  Substance and Sexual Activity   Alcohol use: Yes    Comment: occasional   Drug use: No   Sexual activity: Not on file  Other Topics Concern   Not on file  Social History Narrative   Lives in [place] with wife.  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   Social Drivers of Corporate investment banker Strain: Low Risk  (01/16/2024)   Overall Financial Resource Strain (CARDIA)    Difficulty of Paying Living Expenses: Not hard at all  Food Insecurity: No Food Insecurity (01/16/2024)   Hunger Vital Sign    Worried About Running Out of  Food in the Last Year: Never true    Ran Out of Food in the Last Year: Never true  Transportation Needs: No Transportation Needs (01/16/2024)   PRAPARE - Administrator, Civil Service (Medical): No    Lack of Transportation (Non-Medical): No  Physical Activity: Sufficiently Active (01/16/2024)   Exercise Vital Sign    Days of Exercise per Week: 6 days    Minutes of Exercise per Session: 30 min  Stress: No Stress Concern Present (01/16/2024)   [employer] of Occupational Health - Occupational Stress Questionnaire    Feeling of Stress: Not at all  Social Connections: Moderately Isolated (01/16/2024)   Social Connection and Isolation Panel    Frequency of Communication with Friends and Family: More than three times a week    Frequency of Social Gatherings with Friends and Family: More than three times a week    Attends Religious Services: Never    Database administrator or Organizations: No    Attends Banker Meetings:  Never    Marital Status: Married  Catering manager Violence: Not At Risk (01/16/2024)   Humiliation, Afraid, Rape, and Kick questionnaire    Fear of Current or Ex-Partner: No    Emotionally Abused: No    Physically Abused: No    Sexually Abused: No    Past Surgical History:  Procedure Laterality Date   ABDOMINAL EXPOSURE N/A 01/03/2021   Procedure: ABDOMINAL EXPOSURE;  Surgeon: Gretta Lonni PARAS, MD;  Location: [facility] OR;  Service: Vascular;  Laterality: N/A;   ANTERIOR LAT LUMBAR FUSION Left 01/04/2021   Procedure: Removal Of Broken Hardware;  Surgeon: Unice Pac, MD;  Location: [facility];  Service: Neurosurgery;  Laterality: Left;   ANTERIOR LATERAL LUMBAR FUSION 4 LEVELS Right 06/05/2014   Procedure: ANTERIOR LATERAL LUMBAR FUSION  LUMBAR ONE TO LUMBAR FIVE with Percutaneous Pedicle Screws;  Surgeon: Pac Unice, MD;  Location: [facility];  Service: Neurosurgery;  Laterality: Right;   ANTERIOR LUMBAR FUSION N/A 01/03/2021   Procedure: Lumbar Four-Five Anterior lumbar interbody fusion with revision of graft;  Surgeon: Unice Pac, MD;  Location: [facility] OR;  Service: Neurosurgery;  Laterality: N/A;  Lumbar Four-Five Anterior lumbar interbody fusion with revision of graft   CARPAL TUNNEL RELEASE Right    CERVICAL DISC SURGERY     X 2   COLONOSCOPY     ELBOW ARTHROSCOPY Right    KNEE ARTHROSCOPY Right 08/2013   torn menicus   KNEE ARTHROSCOPY Left 11/2013   chip cartlidge,torn menicus   LUMBAR LAMINECTOMY/DECOMPRESSION MICRODISCECTOMY Right 07/11/2013   Procedure: LUMBAR LAMINECTOMY/DECOMPRESSION MICRODISCECTOMY 1 LEVEL;  Surgeon: Pac Unice, MD;  Location: [facility];  Service: Neurosurgery;  Laterality: Right;  Right L34 microdiskectomy   LUMBAR PERCUTANEOUS PEDICLE SCREW 4 LEVEL N/A 06/05/2014   Procedure: LUMBAR PERCUTANEOUS PEDICLE SCREW 4 LEVEL;  Surgeon: Pac Unice, MD;  Location: [facility];  Service: Neurosurgery;  Laterality: N/A;   MICROLARYNGOSCOPY Left 05/12/2019    Procedure: MICROLARYNGOSCOPY WITH BIOPSY;  Surgeon: Ethyl Lonni BRAVO, MD;  Location: [facility];  Service: ENT;  Laterality: Left;   NECK SURGERY  January 2005, March 2009   C-Spine   POLYPECTOMY     ROTATOR CUFF REPAIR  August 2000, January 2002, July 2008, July 2009   2 left 2 right   TOTAL HIP ARTHROPLASTY Left 09/21/2021   Procedure: TOTAL HIP ARTHROPLASTY ANTERIOR APPROACH;  Surgeon: Melodi Lerner, MD;  Location: [facility] ORS;  Service: Orthopedics;  Laterality:  Left;    Family History  Problem Relation Age of Onset   Bone cancer Mother    Squamous cell carcinoma Mother        died @ 48   Heart attack Mother    Hypertension Father    Diabetes Father        borderline   Congestive Heart Failure Father        alive @ 16   Heart failure Father    Sudden death Brother        died @ 27   Other Brother        died in MVA @ 19 - struck by drunk driver   Other Brother        died in MVA @ 46 - struck by drunk driver   Heart attack Brother    Stroke Paternal Grandfather    Colon cancer Neg Hx    Esophageal cancer Neg Hx    Rectal cancer Neg Hx    Stomach cancer Neg Hx    Colon polyps Neg Hx     Allergies  Allergen Reactions   Lisinopril  Swelling    Swollen Lip   Aspirin  Other (See Comments)   Oxycodone  Hcl Other (See Comments)   Oxycodone -Aspirin  Other (See Comments) and Rash    Percodan caused rash---but he can tolerate Percocet and aspirin  on their own   Percodan [Oxycodone -Aspirin ] Rash    Percodan caused rash---but he can tolerate Percocet and aspirin  on their own     Current Outpatient Medications on File Prior to Visit  Medication Sig Dispense Refill   albuterol  (VENTOLIN  HFA) 108 (90 Base) MCG/ACT inhaler TAKE 2 PUFFS BY MOUTH EVERY 6 HOURS AS NEEDED FOR WHEEZE OR SHORTNESS OF BREATH 8.5 each 2   allopurinol  (ZYLOPRIM ) 100 MG tablet TAKE 1 TABLET (100 MG TOTAL) BY MOUTH IN THE MORNING 90 tablet 1   ALPRAZolam  (XANAX ) 0.5 MG tablet TAKE 1 TABLET BY  MOUTH THREE TIMES A DAY 90 tablet 2   amLODipine  (NORVASC ) 5 MG tablet TAKE 1 TABLET (5 MG TOTAL) BY MOUTH DAILY. 90 tablet 1   atorvastatin  (LIPITOR ) 80 MG tablet Take 1 tablet (80 mg total) by mouth daily. 90 tablet 3   diltiazem  (CARDIZEM  CD) 360 MG 24 hr capsule Take 1 capsule (360 mg total) by mouth daily. 90 capsule 3   Fluticasone-Umeclidin-Vilant (TRELEGY ELLIPTA ) 100-62.5-25 MCG/ACT AEPB Inhale 1 puff into the lungs daily. 3 each 3   gabapentin  (NEURONTIN ) 300 MG capsule gabapentin  300 mg capsule TAKE 1 CAPSULE BY MOUTH FOUR TIMES A DAY     losartan  (COZAAR ) 100 MG tablet TAKE 1 TABLET BY MOUTH EVERY DAY 90 tablet 1   omeprazole  (PRILOSEC ) 40 MG capsule TAKE 1 CAPSULE (40 MG TOTAL) BY MOUTH DAILY. 90 capsule 1   sertraline  (ZOLOFT ) 50 MG tablet Take 1 tablet (50 mg total) by mouth daily. 90 tablet 1   vitamin B-12 (CYANOCOBALAMIN ) 1000 MCG tablet Take 1,000 mcg by mouth in the morning.     No current facility-administered medications on file prior to visit.    BP (!) 138/90   Pulse 93   Temp 98.7 F (37.1 C) (Oral)   Ht 5' 6.75 (1.695 m)   Wt 161 lb (73 kg)   SpO2 96%   BMI 25.41 kg/m       Objective:   Physical Exam Vitals and nursing note reviewed.  Constitutional:      Appearance: Normal appearance.   Musculoskeletal:  General: Swelling present.   Skin:    General: Skin is warm and dry.     Capillary Refill: Capillary refill takes less than 2 seconds.     Findings: Erythema present.       Neurological:     General: No focal deficit present.     Mental Status: He is alert and oriented to person, place, and time.   Psychiatric:        Mood and Affect: Mood normal.        Behavior: Behavior normal.        Thought Content: Thought content normal.        Judgment: Judgment normal.        Assessment & Plan:   1. Insect bite of right wrist, initial encounter (Primary) - Unsure of which insect bite him. Does not appear as a tick bite. Will cover for  localized infection with doxy. Can use benadryl  lotion for itch  - Follow up if not resolved in the next week or sooner if infection spreads - doxycycline  (VIBRAMYCIN ) 100 MG capsule; Take 1 capsule (100 mg total) by mouth 2 (two) times daily.  Dispense: 14 capsule; Refill: 0   Darleene Shape, NP

## 2024-01-30 DIAGNOSIS — Z0279 Encounter for issue of other medical certificate: Secondary | ICD-10-CM

## 2024-01-30 NOTE — Telephone Encounter (Signed)
 Ppw filled out. Pt notified of update and verbalized understanding. Pt requested PPW to be mailed. Form placed in mailbox.

## 2024-02-11 DIAGNOSIS — L821 Other seborrheic keratosis: Secondary | ICD-10-CM | POA: Diagnosis not present

## 2024-02-11 DIAGNOSIS — D492 Neoplasm of unspecified behavior of bone, soft tissue, and skin: Secondary | ICD-10-CM | POA: Diagnosis not present

## 2024-02-11 DIAGNOSIS — L814 Other melanin hyperpigmentation: Secondary | ICD-10-CM | POA: Diagnosis not present

## 2024-02-11 DIAGNOSIS — L578 Other skin changes due to chronic exposure to nonionizing radiation: Secondary | ICD-10-CM | POA: Diagnosis not present

## 2024-02-11 DIAGNOSIS — D225 Melanocytic nevi of trunk: Secondary | ICD-10-CM | POA: Diagnosis not present

## 2024-02-11 DIAGNOSIS — L538 Other specified erythematous conditions: Secondary | ICD-10-CM | POA: Diagnosis not present

## 2024-02-20 ENCOUNTER — Other Ambulatory Visit: Payer: Self-pay | Admitting: Adult Health

## 2024-02-20 DIAGNOSIS — F419 Anxiety disorder, unspecified: Secondary | ICD-10-CM

## 2024-02-20 NOTE — Telephone Encounter (Signed)
 Okay for refill?

## 2024-03-23 ENCOUNTER — Other Ambulatory Visit: Payer: Self-pay | Admitting: Adult Health

## 2024-03-23 DIAGNOSIS — M1000 Idiopathic gout, unspecified site: Secondary | ICD-10-CM

## 2024-03-23 DIAGNOSIS — I10 Essential (primary) hypertension: Secondary | ICD-10-CM

## 2024-04-02 ENCOUNTER — Encounter: Payer: Self-pay | Admitting: Acute Care

## 2024-04-09 ENCOUNTER — Other Ambulatory Visit: Payer: Self-pay | Admitting: Acute Care

## 2024-04-09 DIAGNOSIS — Z122 Encounter for screening for malignant neoplasm of respiratory organs: Secondary | ICD-10-CM

## 2024-04-09 DIAGNOSIS — Z87891 Personal history of nicotine dependence: Secondary | ICD-10-CM

## 2024-05-13 ENCOUNTER — Ambulatory Visit
Admission: RE | Admit: 2024-05-13 | Discharge: 2024-05-13 | Disposition: A | Discharge status: Home / Self Care | Source: Ambulatory Visit | Attending: Adult Health | Admitting: Adult Health

## 2024-05-13 DIAGNOSIS — Z87891 Personal history of nicotine dependence: Secondary | ICD-10-CM | POA: Diagnosis not present

## 2024-05-13 DIAGNOSIS — Z122 Encounter for screening for malignant neoplasm of respiratory organs: Secondary | ICD-10-CM

## 2024-05-16 ENCOUNTER — Other Ambulatory Visit: Payer: Self-pay | Admitting: Adult Health

## 2024-05-16 ENCOUNTER — Telehealth: Payer: Self-pay | Admitting: Acute Care

## 2024-05-16 ENCOUNTER — Telehealth: Payer: Self-pay

## 2024-05-16 ENCOUNTER — Other Ambulatory Visit: Payer: Self-pay

## 2024-05-16 DIAGNOSIS — Z122 Encounter for screening for malignant neoplasm of respiratory organs: Secondary | ICD-10-CM

## 2024-05-16 DIAGNOSIS — R911 Solitary pulmonary nodule: Secondary | ICD-10-CM

## 2024-05-16 DIAGNOSIS — Z87891 Personal history of nicotine dependence: Secondary | ICD-10-CM

## 2024-05-16 DIAGNOSIS — I10 Essential (primary) hypertension: Secondary | ICD-10-CM

## 2024-05-16 NOTE — Telephone Encounter (Signed)
 See other telephone note from 05/16/24

## 2024-05-16 NOTE — Telephone Encounter (Signed)
 Call report   IMPRESSION: 1. Lung-RADS 4A, suspicious. Follow up low-dose chest CT without contrast in 3 months (please use the following order, CT CHEST LCS NODULE FOLLOW-UP W/O CM) is recommended. New 6.3 mm posteromedial right upper lobe pulmonary nodule. 2. Two-vessel coronary atherosclerosis. 3. Aortic Atherosclerosis (ICD10-I70.0) and Emphysema (ICD10-J43.9).

## 2024-05-16 NOTE — Telephone Encounter (Signed)
 Spoke with patient and reviewed recent Lung CT results. He will complete a 3 month follow up scan will be 08/14/2024 to evaluate a new 6.3 mm nodule. Order placed. Results and plan to PCP.

## 2024-05-16 NOTE — Telephone Encounter (Signed)
 New 6.3 mm right upper lobe lung nodule  3 month follow up is fine, due 07/2024 CAD/ aortic atherosclerosis Pt. Is on a statin  Thanks so much

## 2024-05-19 DIAGNOSIS — H25813 Combined forms of age-related cataract, bilateral: Secondary | ICD-10-CM | POA: Diagnosis not present

## 2024-05-19 DIAGNOSIS — H5213 Myopia, bilateral: Secondary | ICD-10-CM | POA: Diagnosis not present

## 2024-05-19 DIAGNOSIS — H52203 Unspecified astigmatism, bilateral: Secondary | ICD-10-CM | POA: Diagnosis not present

## 2024-05-31 ENCOUNTER — Other Ambulatory Visit: Payer: Self-pay | Admitting: Adult Health

## 2024-06-09 ENCOUNTER — Other Ambulatory Visit: Payer: Self-pay | Admitting: Adult Health

## 2024-06-09 DIAGNOSIS — F419 Anxiety disorder, unspecified: Secondary | ICD-10-CM

## 2024-06-10 DIAGNOSIS — H25813 Combined forms of age-related cataract, bilateral: Secondary | ICD-10-CM | POA: Diagnosis not present

## 2024-06-27 ENCOUNTER — Other Ambulatory Visit: Payer: Self-pay | Admitting: Adult Health

## 2024-06-27 DIAGNOSIS — F419 Anxiety disorder, unspecified: Secondary | ICD-10-CM

## 2024-07-01 ENCOUNTER — Other Ambulatory Visit: Payer: Self-pay | Admitting: Adult Health

## 2024-07-01 DIAGNOSIS — W57XXXA Bitten or stung by nonvenomous insect and other nonvenomous arthropods, initial encounter: Secondary | ICD-10-CM

## 2024-07-03 DIAGNOSIS — H25811 Combined forms of age-related cataract, right eye: Secondary | ICD-10-CM | POA: Diagnosis not present

## 2024-07-03 DIAGNOSIS — F418 Other specified anxiety disorders: Secondary | ICD-10-CM | POA: Diagnosis not present

## 2024-07-03 DIAGNOSIS — Z961 Presence of intraocular lens: Secondary | ICD-10-CM | POA: Diagnosis not present

## 2024-07-03 DIAGNOSIS — H2511 Age-related nuclear cataract, right eye: Secondary | ICD-10-CM | POA: Diagnosis not present

## 2024-08-15 ENCOUNTER — Ambulatory Visit
Admission: RE | Admit: 2024-08-15 | Discharge: 2024-08-15 | Disposition: A | Source: Ambulatory Visit | Attending: Adult Health

## 2024-08-15 DIAGNOSIS — R911 Solitary pulmonary nodule: Secondary | ICD-10-CM

## 2024-08-15 DIAGNOSIS — Z87891 Personal history of nicotine dependence: Secondary | ICD-10-CM

## 2024-08-15 DIAGNOSIS — Z122 Encounter for screening for malignant neoplasm of respiratory organs: Secondary | ICD-10-CM

## 2024-08-18 ENCOUNTER — Telehealth: Payer: Self-pay

## 2024-08-18 NOTE — Telephone Encounter (Signed)
 Called patient to review results. LVM to call office.   IMPRESSION: 1. Previously seen 6.3 mm nodule in the posterior right upper lobe has decreased in size to 3.9 mm, favoring a benign etiology. Lung-RADS 3, probably benign findings. Short-term follow-up in 6 months is recommended with repeat low-dose chest CT without contrast (please use the following order, CT CHEST LCS NODULE FOLLOW-UP W/O CM). 2. Aortic atherosclerosis (ICD10-I70.0). Coronary artery calcification. 3.  Emphysema (ICD10-J43.9).

## 2024-08-19 ENCOUNTER — Other Ambulatory Visit: Payer: Self-pay

## 2024-08-19 DIAGNOSIS — Z122 Encounter for screening for malignant neoplasm of respiratory organs: Secondary | ICD-10-CM

## 2024-08-19 DIAGNOSIS — Z87891 Personal history of nicotine dependence: Secondary | ICD-10-CM

## 2024-08-19 DIAGNOSIS — R911 Solitary pulmonary nodule: Secondary | ICD-10-CM

## 2024-08-19 NOTE — Telephone Encounter (Signed)
 Spoke with patient and reviewed recent Lung CT results. He is in agreement to complete a 6 month follow up scan, scheduled for 02/04/2025. Order placed. Results and plan to PCP.

## 2025-01-21 ENCOUNTER — Ambulatory Visit

## 2025-02-04 ENCOUNTER — Ambulatory Visit
